# Patient Record
Sex: Male | Born: 1945
Health system: Southern US, Community
[De-identification: ages and names within clinical notes are randomized; demographics above are authoritative.]

## PROBLEM LIST (undated history)

## (undated) DIAGNOSIS — E119 Type 2 diabetes mellitus without complications: Secondary | ICD-10-CM

## (undated) DIAGNOSIS — I2510 Atherosclerotic heart disease of native coronary artery without angina pectoris: Secondary | ICD-10-CM

## (undated) DIAGNOSIS — I10 Essential (primary) hypertension: Secondary | ICD-10-CM

## (undated) DIAGNOSIS — R600 Localized edema: Secondary | ICD-10-CM

## (undated) DIAGNOSIS — R29898 Other symptoms and signs involving the musculoskeletal system: Secondary | ICD-10-CM

## (undated) DIAGNOSIS — M4800 Spinal stenosis, site unspecified: Secondary | ICD-10-CM

## (undated) DIAGNOSIS — E785 Hyperlipidemia, unspecified: Secondary | ICD-10-CM

## (undated) DIAGNOSIS — E663 Overweight: Secondary | ICD-10-CM

## (undated) DIAGNOSIS — L97509 Non-pressure chronic ulcer of other part of unspecified foot with unspecified severity: Secondary | ICD-10-CM

## (undated) HISTORY — PX: NO PAST SURGERIES: SHX2092

## (undated) HISTORY — DX: Spinal stenosis, site unspecified: M48.00

## (undated) HISTORY — DX: Non-pressure chronic ulcer of other part of unspecified foot with unspecified severity: L97.509

## (undated) HISTORY — DX: Localized edema: R60.0

## (undated) HISTORY — DX: Atherosclerotic heart disease of native coronary artery without angina pectoris: I25.10

## (undated) HISTORY — DX: Overweight: E66.3

## (undated) HISTORY — DX: Other symptoms and signs involving the musculoskeletal system: R29.898

---

## 2008-08-17 ENCOUNTER — Emergency Department (HOSPITAL_BASED_OUTPATIENT_CLINIC_OR_DEPARTMENT_OTHER): Admission: EM | Admit: 2008-08-17 | Discharge: 2008-08-17 | Payer: Self-pay | Admitting: Emergency Medicine

## 2008-09-21 ENCOUNTER — Encounter: Admission: RE | Admit: 2008-09-21 | Discharge: 2008-09-21 | Payer: Self-pay | Admitting: Sports Medicine

## 2014-04-30 ENCOUNTER — Ambulatory Visit: Payer: Self-pay | Admitting: Skilled Nursing Facility1

## 2014-10-04 ENCOUNTER — Ambulatory Visit: Payer: PPO | Admitting: Dietician

## 2014-10-15 ENCOUNTER — Ambulatory Visit: Payer: PPO | Admitting: Dietician

## 2014-11-09 ENCOUNTER — Ambulatory Visit: Payer: PPO | Admitting: *Deleted

## 2015-03-01 ENCOUNTER — Other Ambulatory Visit: Payer: Self-pay

## 2015-03-01 ENCOUNTER — Encounter (HOSPITAL_COMMUNITY)
Admission: EM | Disposition: A | Discharge status: Home / Self Care | Payer: Self-pay | Source: Home / Self Care | Attending: Cardiology

## 2015-03-01 ENCOUNTER — Encounter (HOSPITAL_COMMUNITY): Payer: Self-pay | Admitting: Emergency Medicine

## 2015-03-01 ENCOUNTER — Emergency Department (HOSPITAL_COMMUNITY): Payer: PPO

## 2015-03-01 ENCOUNTER — Inpatient Hospital Stay (HOSPITAL_COMMUNITY)
Admission: EM | Admit: 2015-03-01 | Discharge: 2015-03-02 | DRG: 247 | Disposition: A | Discharge status: Home / Self Care | Payer: PPO | Attending: Cardiology | Admitting: Cardiology

## 2015-03-01 DIAGNOSIS — E876 Hypokalemia: Secondary | ICD-10-CM | POA: Diagnosis present

## 2015-03-01 DIAGNOSIS — Z87891 Personal history of nicotine dependence: Secondary | ICD-10-CM

## 2015-03-01 DIAGNOSIS — I25110 Atherosclerotic heart disease of native coronary artery with unstable angina pectoris: Secondary | ICD-10-CM | POA: Diagnosis present

## 2015-03-01 DIAGNOSIS — I10 Essential (primary) hypertension: Secondary | ICD-10-CM | POA: Diagnosis not present

## 2015-03-01 DIAGNOSIS — E785 Hyperlipidemia, unspecified: Secondary | ICD-10-CM | POA: Diagnosis present

## 2015-03-01 DIAGNOSIS — I2510 Atherosclerotic heart disease of native coronary artery without angina pectoris: Secondary | ICD-10-CM

## 2015-03-01 DIAGNOSIS — I214 Non-ST elevation (NSTEMI) myocardial infarction: Secondary | ICD-10-CM | POA: Diagnosis not present

## 2015-03-01 DIAGNOSIS — Z955 Presence of coronary angioplasty implant and graft: Secondary | ICD-10-CM

## 2015-03-01 HISTORY — DX: Atherosclerotic heart disease of native coronary artery without angina pectoris: I25.10

## 2015-03-01 HISTORY — DX: Hyperlipidemia, unspecified: E78.5

## 2015-03-01 HISTORY — PX: CARDIAC CATHETERIZATION: SHX172

## 2015-03-01 HISTORY — DX: Essential (primary) hypertension: I10

## 2015-03-01 LAB — I-STAT TROPONIN, ED: TROPONIN I, POC: 0.13 ng/mL — AB (ref 0.00–0.08)

## 2015-03-01 LAB — BASIC METABOLIC PANEL
Anion gap: 14 (ref 5–15)
Anion gap: 7 (ref 5–15)
BUN: 19 mg/dL (ref 6–20)
BUN: 17 mg/dL (ref 6–20)
CHLORIDE: 106 mmol/L (ref 101–111)
CHLORIDE: 102 mmol/L (ref 101–111)
CO2: 20 mmol/L — ABNORMAL LOW (ref 22–32)
CO2: 27 mmol/L (ref 22–32)
CREATININE: 0.74 mg/dL (ref 0.61–1.24)
CREATININE: 0.76 mg/dL (ref 0.61–1.24)
Calcium: 9 mg/dL (ref 8.9–10.3)
Calcium: 8.8 mg/dL — ABNORMAL LOW (ref 8.9–10.3)
GFR calc non Af Amer: >60 mL/min (ref >60)
Glucose, Bld: 122 mg/dL — ABNORMAL HIGH (ref 65–99)
Glucose, Bld: 114 mg/dL — ABNORMAL HIGH (ref 65–99)
POTASSIUM: 3.3 mmol/L — AB (ref 3.5–5.1)
POTASSIUM: 4.3 mmol/L (ref 3.5–5.1)
SODIUM: 140 mmol/L (ref 135–145)
SODIUM: 136 mmol/L (ref 135–145)

## 2015-03-01 LAB — CBC
HCT: 39.2 % (ref 39.0–52.0)
HEMOGLOBIN: 13.5 g/dL (ref 13.0–17.0)
MCH: 31.2 pg (ref 26.0–34.0)
MCHC: 34.4 g/dL (ref 30.0–36.0)
MCV: 90.5 fL (ref 78.0–100.0)
PLATELETS: 169 10*3/uL (ref 150–400)
RBC: 4.33 MIL/uL (ref 4.22–5.81)
RDW: 12.9 % (ref 11.5–15.5)
WBC: 6.7 10*3/uL (ref 4.0–10.5)

## 2015-03-01 LAB — MRSA PCR SCREENING: MRSA by PCR: NEGATIVE

## 2015-03-01 LAB — TSH: TSH: 1.631 u[IU]/mL (ref 0.350–4.500)

## 2015-03-01 LAB — PROTIME-INR
INR: 1.1 (ref 0.00–1.49)
Prothrombin Time: 14.4 seconds (ref 11.6–15.2)

## 2015-03-01 LAB — LIPID PANEL
CHOL/HDL RATIO: 4 ratio
CHOLESTEROL: 157 mg/dL (ref 0–200)
HDL: 39 mg/dL — ABNORMAL LOW (ref >40)
LDL Cholesterol: 93 mg/dL (ref 0–99)
TRIGLYCERIDES: 125 mg/dL (ref <150)
VLDL: 25 mg/dL (ref 0–40)

## 2015-03-01 LAB — TROPONIN I
TROPONIN I: 0.17 ng/mL — AB (ref <0.031)
TROPONIN I: 0.15 ng/mL — AB (ref <0.031)

## 2015-03-01 SURGERY — LEFT HEART CATH AND CORONARY ANGIOGRAPHY
Anesthesia: LOCAL

## 2015-03-01 MED ORDER — LORATADINE 10 MG PO TABS
10.0000 mg | ORAL_TABLET | Freq: Every day | ORAL | Status: DC
  Administered 2015-03-01: 10 mg via ORAL
  Filled 2015-03-01 (×2): qty 1

## 2015-03-01 MED ORDER — HEPARIN SODIUM (PORCINE) 1000 UNIT/ML IJ SOLN
INTRAMUSCULAR | Status: AC
  Filled 2015-03-01: qty 1

## 2015-03-01 MED ORDER — SODIUM CHLORIDE 0.9 % IV SOLN: 250.0000 mL | INTRAVENOUS | Status: DC | PRN

## 2015-03-01 MED ORDER — POTASSIUM CHLORIDE CRYS ER 20 MEQ PO TBCR
40.0000 meq | EXTENDED_RELEASE_TABLET | ORAL | Status: AC
Start: 2015-03-01 — End: 2015-03-01
  Administered 2015-03-01 (×2): 40 meq via ORAL
  Filled 2015-03-01 (×2): qty 2

## 2015-03-01 MED ORDER — LISINOPRIL 10 MG PO TABS
20.0000 mg | ORAL_TABLET | Freq: Every day | ORAL | Status: DC
  Administered 2015-03-01 – 2015-03-02 (×2): 20 mg via ORAL
  Filled 2015-03-01: qty 2
  Filled 2015-03-01: qty 1

## 2015-03-01 MED ORDER — SODIUM CHLORIDE 0.9 % WEIGHT BASED INFUSION
1.0000 mL/kg/h | INTRAVENOUS | Status: DC
  Administered 2015-03-01: 1 mL/kg/h via INTRAVENOUS

## 2015-03-01 MED ORDER — FENTANYL CITRATE (PF) 100 MCG/2ML IJ SOLN
INTRAMUSCULAR | Status: AC
Start: 2015-03-01 — End: 2015-03-01
  Filled 2015-03-01: qty 4

## 2015-03-01 MED ORDER — VERAPAMIL HCL 2.5 MG/ML IV SOLN
INTRAVENOUS | Status: DC | PRN
  Administered 2015-03-01: 16:00:00 via INTRA_ARTERIAL

## 2015-03-01 MED ORDER — HEART ATTACK BOUNCING BOOK
Freq: Once | Status: AC
  Administered 2015-03-01: 22:00:00
  Filled 2015-03-01: qty 1

## 2015-03-01 MED ORDER — HEPARIN (PORCINE) IN NACL 2-0.9 UNIT/ML-% IJ SOLN
INTRAMUSCULAR | Status: DC | PRN
  Administered 2015-03-01: 17:00:00

## 2015-03-01 MED ORDER — NITROGLYCERIN 0.4 MG SL SUBL: 0.4000 mg | SUBLINGUAL_TABLET | SUBLINGUAL | Status: DC | PRN

## 2015-03-01 MED ORDER — TICAGRELOR 90 MG PO TABS
90.0000 mg | ORAL_TABLET | Freq: Two times a day (BID) | ORAL | Status: DC
  Administered 2015-03-02: 90 mg via ORAL
  Filled 2015-03-01: qty 1

## 2015-03-01 MED ORDER — SODIUM CHLORIDE 0.9 % WEIGHT BASED INFUSION
3.0000 mL/kg/h | INTRAVENOUS | Status: DC
  Administered 2015-03-01: 3 mL/kg/h via INTRAVENOUS

## 2015-03-01 MED ORDER — FENTANYL CITRATE (PF) 100 MCG/2ML IJ SOLN
INTRAMUSCULAR | Status: DC | PRN
Start: 2015-03-01 — End: 2015-03-01
  Administered 2015-03-01: 25 ug via INTRAVENOUS
  Administered 2015-03-01: 50 ug via INTRAVENOUS

## 2015-03-01 MED ORDER — ONDANSETRON HCL 4 MG/2ML IJ SOLN: 4.0000 mg | Freq: Four times a day (QID) | INTRAMUSCULAR | Status: DC | PRN

## 2015-03-01 MED ORDER — FLUTICASONE PROPIONATE 50 MCG/ACT NA SUSP
2.0000 | Freq: Every day | NASAL | Status: DC
  Administered 2015-03-01: 2 via NASAL
  Filled 2015-03-01: qty 16

## 2015-03-01 MED ORDER — BISOPROLOL FUMARATE 10 MG PO TABS
10.0000 mg | ORAL_TABLET | Freq: Every day | ORAL | Status: DC
  Administered 2015-03-01 – 2015-03-02 (×2): 10 mg via ORAL
  Filled 2015-03-01 (×4): qty 1

## 2015-03-01 MED ORDER — ASPIRIN EC 81 MG PO TBEC: 81.0000 mg | DELAYED_RELEASE_TABLET | Freq: Every day | ORAL | Status: DC

## 2015-03-01 MED ORDER — SODIUM CHLORIDE 0.9 % IJ SOLN
3.0000 mL | Freq: Two times a day (BID) | INTRAMUSCULAR | Status: DC
  Administered 2015-03-01: 3 mL via INTRAVENOUS

## 2015-03-01 MED ORDER — SODIUM CHLORIDE 0.9 % IJ SOLN: 3.0000 mL | INTRAMUSCULAR | Status: DC | PRN

## 2015-03-01 MED ORDER — HYDROCHLOROTHIAZIDE 12.5 MG PO CAPS
12.5000 mg | ORAL_CAPSULE | Freq: Every day | ORAL | Status: DC
  Administered 2015-03-01 – 2015-03-02 (×2): 12.5 mg via ORAL
  Filled 2015-03-01 (×2): qty 1

## 2015-03-01 MED ORDER — LISINOPRIL-HYDROCHLOROTHIAZIDE 20-12.5 MG PO TABS: 1.0000 | ORAL_TABLET | Freq: Every day | ORAL | Status: DC

## 2015-03-01 MED ORDER — TICAGRELOR 90 MG PO TABS
ORAL_TABLET | ORAL | Status: AC
  Filled 2015-03-01: qty 1

## 2015-03-01 MED ORDER — HEPARIN (PORCINE) IN NACL 100-0.45 UNIT/ML-% IJ SOLN
1400.0000 [IU]/h | INTRAMUSCULAR | Status: DC
  Administered 2015-03-01: 1400 [IU]/h via INTRAVENOUS
  Filled 2015-03-01 (×2): qty 250

## 2015-03-01 MED ORDER — ASPIRIN 81 MG PO CHEW: 81.0000 mg | CHEWABLE_TABLET | ORAL | Status: DC

## 2015-03-01 MED ORDER — PANTOPRAZOLE SODIUM 40 MG PO TBEC
40.0000 mg | DELAYED_RELEASE_TABLET | Freq: Every day | ORAL | Status: DC
  Administered 2015-03-01 – 2015-03-02 (×2): 40 mg via ORAL
  Filled 2015-03-01 (×2): qty 1

## 2015-03-01 MED ORDER — MIDAZOLAM HCL 2 MG/2ML IJ SOLN
INTRAMUSCULAR | Status: DC | PRN
  Administered 2015-03-01: 1 mg via INTRAVENOUS
  Administered 2015-03-01: 2 mg via INTRAVENOUS

## 2015-03-01 MED ORDER — MIDAZOLAM HCL 2 MG/2ML IJ SOLN
INTRAMUSCULAR | Status: AC
  Filled 2015-03-01: qty 4

## 2015-03-01 MED ORDER — SODIUM CHLORIDE 0.9 % IJ SOLN: 3.0000 mL | Freq: Two times a day (BID) | INTRAMUSCULAR | Status: DC

## 2015-03-01 MED ORDER — LIDOCAINE HCL (PF) 1 % IJ SOLN
INTRAMUSCULAR | Status: AC
  Filled 2015-03-01: qty 30

## 2015-03-01 MED ORDER — HEPARIN (PORCINE) IN NACL 2-0.9 UNIT/ML-% IJ SOLN
INTRAMUSCULAR | Status: AC
  Filled 2015-03-01: qty 1000

## 2015-03-01 MED ORDER — VERAPAMIL HCL 2.5 MG/ML IV SOLN
INTRAVENOUS | Status: AC
  Filled 2015-03-01: qty 2

## 2015-03-01 MED ORDER — HEPARIN SODIUM (PORCINE) 1000 UNIT/ML IJ SOLN
INTRAMUSCULAR | Status: DC | PRN
  Administered 2015-03-01: 4500 [IU] via INTRAVENOUS
  Administered 2015-03-01: 5500 [IU] via INTRAVENOUS
  Administered 2015-03-01: 3000 [IU] via INTRAVENOUS

## 2015-03-01 MED ORDER — ANGIOPLASTY BOOK
Freq: Once | Status: AC
  Administered 2015-03-01: 19:00:00
  Filled 2015-03-01: qty 1

## 2015-03-01 MED ORDER — TICAGRELOR 90 MG PO TABS
ORAL_TABLET | ORAL | Status: DC | PRN
  Administered 2015-03-01: 180 mg via ORAL

## 2015-03-01 MED ORDER — OXYCODONE-ACETAMINOPHEN 5-325 MG PO TABS: 1.0000 | ORAL_TABLET | ORAL | Status: DC | PRN

## 2015-03-01 MED ORDER — ALPRAZOLAM 0.25 MG PO TABS
0.2500 mg | ORAL_TABLET | Freq: Once | ORAL | Status: AC | PRN
  Administered 2015-03-01: 23:00:00 0.25 mg via ORAL
  Filled 2015-03-01: qty 1

## 2015-03-01 MED ORDER — ASPIRIN EC 81 MG PO TBEC
81.0000 mg | DELAYED_RELEASE_TABLET | Freq: Every day | ORAL | Status: DC
  Administered 2015-03-01: 81 mg via ORAL
  Filled 2015-03-01: qty 1

## 2015-03-01 MED ORDER — LEVOCETIRIZINE DIHYDROCHLORIDE 5 MG PO TABS: 5.0000 mg | ORAL_TABLET | Freq: Every evening | ORAL | Status: DC

## 2015-03-01 MED ORDER — HEPARIN BOLUS VIA INFUSION
4000.0000 [IU] | Freq: Once | INTRAVENOUS | Status: AC
  Administered 2015-03-01: 4000 [IU] via INTRAVENOUS
  Filled 2015-03-01: qty 4000

## 2015-03-01 MED ORDER — SODIUM CHLORIDE 0.9 % IV SOLN: INTRAVENOUS | Status: AC

## 2015-03-01 MED ORDER — MORPHINE SULFATE (PF) 2 MG/ML IV SOLN
2.0000 mg | INTRAVENOUS | Status: DC | PRN
Start: 2015-03-01 — End: 2015-03-02

## 2015-03-01 MED ORDER — ATORVASTATIN CALCIUM 40 MG PO TABS
40.0000 mg | ORAL_TABLET | Freq: Every day | ORAL | Status: DC
  Administered 2015-03-01: 40 mg via ORAL
  Filled 2015-03-01 (×2): qty 1

## 2015-03-01 MED ORDER — IOHEXOL 350 MG/ML SOLN
INTRAVENOUS | Status: DC | PRN
  Administered 2015-03-01: 70 mL via INTRA_ARTERIAL

## 2015-03-01 MED ORDER — ACETAMINOPHEN 325 MG PO TABS
650.0000 mg | ORAL_TABLET | ORAL | Status: DC | PRN
Start: 2015-03-01 — End: 2015-03-02

## 2015-03-01 SURGICAL SUPPLY — 23 items
BALLN EMERGE MR 2.5X12 (BALLOONS) ×2
BALLN ~~LOC~~ EMERGE MR 4.0X8 (BALLOONS) ×2
BALLN ~~LOC~~ EMERGE MR 4.5X8 (BALLOONS) ×2
BALLOON EMERGE MR 2.5X12 (BALLOONS) ×1 IMPLANT
BALLOON ~~LOC~~ EMERGE MR 4.0X8 (BALLOONS) ×1 IMPLANT
BALLOON ~~LOC~~ EMERGE MR 4.5X8 (BALLOONS) ×1 IMPLANT
CATH INFINITI 5 FR JL3.5 (CATHETERS) ×2 IMPLANT
CATH INFINITI 5FR ANG PIGTAIL (CATHETERS) ×2 IMPLANT
CATH INFINITI JR4 5F (CATHETERS) ×2 IMPLANT
CATH OPTICROSS 40MHZ (CATHETERS) ×2 IMPLANT
CATH VISTA GUIDE 6FR XBLAD3.5 (CATHETERS) ×2 IMPLANT
DEVICE RAD COMP TR BAND LRG (VASCULAR PRODUCTS) ×2 IMPLANT
GLIDESHEATH SLEND SS 6F .021 (SHEATH) ×2 IMPLANT
KIT ENCORE 26 ADVANTAGE (KITS) ×2 IMPLANT
KIT HEART LEFT (KITS) ×2 IMPLANT
PACK CARDIAC CATHETERIZATION (CUSTOM PROCEDURE TRAY) ×2 IMPLANT
SLED PULL BACK IVUS (MISCELLANEOUS) ×2 IMPLANT
STENT PROMUS PREM MR 4.0X16 (Permanent Stent) ×2 IMPLANT
SYR MEDRAD MARK V 150ML (SYRINGE) ×2 IMPLANT
TRANSDUCER W/STOPCOCK (MISCELLANEOUS) ×2 IMPLANT
TUBING CIL FLEX 10 FLL-RA (TUBING) ×2 IMPLANT
WIRE COUGAR XT STRL 190CM (WIRE) ×2 IMPLANT
WIRE SAFE-T 1.5MM-J .035X260CM (WIRE) ×2 IMPLANT

## 2015-03-01 NOTE — ED Notes (Signed)
Pt remains monitored by blood pressure, pulse ox, and 12 lead.  

## 2015-03-01 NOTE — Interval H&P Note (Signed)
History and Physical Interval Note:  03/01/2015 3:05 PM  Vivien PrestoRobert W Bookbinder  has presented today for cardiac catheterization with the diagnosis of nstemi, unstable angina. The various methods of treatment have been discussed with the patient and family. After consideration of risks, benefits and other options for treatment, the patient has consented to  Procedure(s): Left Heart Cath and Coronary Angiography (N/A) as a surgical intervention .  The patient's history has been reviewed, patient examined, no change in status, stable for surgery.  I have reviewed the patient's chart and labs.  Questions were answered to the patient's satisfaction.    Cath Lab Visit (complete for each Cath Lab visit)  Clinical Evaluation Leading to the Procedure:   ACS: Yes.    Non-ACS:    Anginal Classification: CCS III  Anti-ischemic medical therapy: Minimal Therapy (1 class of medications)  Non-Invasive Test Results: No non-invasive testing performed  Prior CABG: No previous CABG        Casha Estupinan

## 2015-03-01 NOTE — Progress Notes (Signed)
ANTICOAGULATION CONSULT NOTE - Initial Consult  Pharmacy Consult for heparin Indication: chest pain/ACS  No Known Allergies  Patient Measurements: Height: 5' 11.5" (181.6 cm) Weight: 240 lb (108.863 kg) IBW/kg (Calculated) : 76.45 Heparin Dosing Weight: 99.6  Vital Signs: Temp: 98.6 F (37 C) (10/28 0108) Temp Source: Oral (10/28 0108) BP: 149/83 mmHg (10/28 0720) Pulse Rate: 91 (10/28 0720)  Labs:  Recent Labs  03/01/15 0127 03/01/15 0646  HGB 13.5  --   HCT 39.2  --   PLT 169  --   CREATININE 0.74  --   TROPONINI  --  0.17*    Estimated Creatinine Clearance: 113.4 mL/min (by C-G formula based on Cr of 0.74).   Medical History: Past Medical History  Diagnosis Date  . Hypertension   . Hyperlipidemia     Medications:  Scheduled:  . aspirin EC  81 mg Oral Daily  . atorvastatin  40 mg Oral q1800  . bisoprolol  10 mg Oral Q0600  . heparin  4,000 Units Intravenous Once  . lisinopril  20 mg Oral Daily   And  . hydrochlorothiazide  12.5 mg Oral Daily  . loratadine  10 mg Oral q1800  . pantoprazole  40 mg Oral Daily  . potassium chloride  40 mEq Oral Q2H    Assessment: 69 yo male admitted for chest pain. R/o possible ACS. No acute changes on EKG, troponin 0.13 > 0.17. Pharmacy consulted to dose heparin. Hgb 13.5 and PLT 169.  Goal of Therapy:  Heparin level 0.3-0.7 units/ml Monitor platelets by anticoagulation protocol: Yes   Plan:  Give 4000 units bolus x 1 Start heparin infusion at 1400 units/hr Check anti-Xa level in 6 hours and daily while on heparin Continue to monitor H&H and platelets   Sherron MondayAubrey N. Terrisha Lopata, PharmD Clinical Pharmacy Resident Pager: [phone] 03/01/2015 9:06 AM

## 2015-03-01 NOTE — ED Provider Notes (Addendum)
History  By signing my name below, I, Karle Plumber, attest that this documentation has been prepared under the direction and in the presence of Geoffery Lyons, MD. Electronically Signed: Karle Plumber, ED Scribe. 03/01/2015. 2:06 AM   Chief Complaint  Patient presents with  . Chest Pain   Patient is a 69 y.o. male presenting with chest pain. The history is provided by the patient and medical records. No language interpreter was used.  Chest Pain Pain location:  L chest Pain radiates to:  L arm Pain severity:  No pain Onset quality:  Sudden Progression:  Resolved Chronicity:  Recurrent Context: at rest   Relieved by:  Aspirin Worsened by:  Nothing tried Associated symptoms: back pain   Associated symptoms: no shortness of breath   Risk factors: hypertension and obesity   Risk factors: no smoking     HPI Comments:  Alex Jackson is a 69 y.o. male, brought in by EMS, who presents to the Emergency Department complaining of CP that began PTA. He states approximately 10 days ago he began having pain around his right kidney. He states he received a chiropractic treatment with moderate, but not complete relief. Pt reports working at [employer] for the past several days and does not normally do that on a regular basis. He states he began having some mild pressure in his chest a few days ago but was relieved by belching and two baby ASA. He states he had similar symptoms PTA, took "a couple baby ASAs" and called EMS. He states by the time paramedics arrived, the pain was gone and is still gone. He denies modifying factors. He denies nausea, vomiting or diarrhea. He denies any h/o cardiac problems. He reports drinking "a couple drinks" per night. He denies smoking.   History reviewed. No pertinent past medical history. History reviewed. No pertinent past surgical history. History reviewed. No pertinent family history. Social History  Substance Use Topics  . Smoking status:  Former Smoker -- 1.00 packs/day    Quit date: 07/03/1983  . Smokeless tobacco: None  . Alcohol Use: Yes     Comment: "one or two drinks a night"    Review of Systems  Respiratory: Negative for shortness of breath.   Cardiovascular: Positive for chest pain.  Musculoskeletal: Positive for back pain.  All other systems reviewed and are negative.   Allergies  Review of patient's allergies indicates no known allergies.  Home Medications   Prior to Admission medications   Not on File   Triage Vitals: BP 134/72 mmHg  Pulse 79  Temp(Src) 98.6 F (37 C) (Oral)  Resp 14  Ht 5' 11.5" (1.816 m)  Wt 240 lb (108.863 kg)  BMI 33.01 kg/m2  SpO2 99% Physical Exam  Constitutional: He is oriented to person, place, and time. He appears well-developed and well-nourished.  HENT:  Head: Normocephalic.  Eyes: EOM are normal.  Neck: Normal range of motion.  Cardiovascular: Normal rate, regular rhythm and normal heart sounds.   No murmur heard. Pulmonary/Chest: Effort normal and breath sounds normal. No respiratory distress.  Abdominal: Soft. He exhibits no distension.  Musculoskeletal: Normal range of motion.  Neurological: He is alert and oriented to person, place, and time.  Psychiatric: He has a normal mood and affect.  Nursing note and vitals reviewed.   ED Course  Procedures (including critical care time) DIAGNOSTIC STUDIES: Oxygen Saturation is 99% on RA, normal by my interpretation.   COORDINATION OF CARE: 1:56 AM- Will consult cardiology and  wait for pending labs to result. Pt verbalizes understanding and agrees to plan.  Medications - No data to display  Labs Review Labs Reviewed  BASIC METABOLIC PANEL - Abnormal; Notable for the following:    Potassium 3.3 (*)    CO2 20 (*)    Glucose, Bld 122 (*)    All other components within normal limits  I-STAT TROPOININ, ED - Abnormal; Notable for the following:    Troponin i, poc 0.13 (*)    All other components within normal  limits  CBC    Imaging Review No results found. I have personally reviewed and evaluated these images and lab results as part of my medical decision-making.   EKG Interpretation   Date/Time:  Friday March 01 2015 00:57:02 EDT Ventricular Rate:  82 PR Interval:  181 QRS Duration: 110 QT Interval:  374 QTC Calculation: 437 R Axis:   81 Text Interpretation:  Sinus rhythm Borderline right axis deviation  Confirmed by Renesha Lizama  MD, Tine Mabee ([id number]) on 03/01/2015 1:22:23 AM      MDM   Final diagnoses:  None    Patient with intermittent chest discomfort for the past several days. He experienced an episode prior to coming here that was worse than prior episodes. He called 911 and his symptoms resolved prior to their arrival. He was transported here pain-free and arrives here pain free. His initial EKG reveals no acute process, however his initial troponin is elevated at 0.12. Due to the nature of his discomfort and the elevated troponin, I have called cardiology who will admit the patient. Dr. Onalee HuaAlvarez came to the ER and has taken care of this.  I personally performed the services described in this documentation, which was scribed in my presence. The recorded information has been reviewed and is accurate.      Geoffery Lyonsouglas Braddock Servellon, MD 03/01/15 [id number]  Geoffery Lyonsouglas Kawehi Hostetter, MD 03/01/15 [phone]

## 2015-03-01 NOTE — Progress Notes (Signed)
Risk and benefit of procedure explained to the patient who display clear understanding and agree to proceed.  Discussed with patient possible procedural risk include bleeding, vascular injury, renal injury, arrythmia, MI, stroke and loss of limb or life.  Signed, Eulla Kochanowski PA Pager: [phone]  

## 2015-03-01 NOTE — ED Notes (Signed)
MD at bedside. 

## 2015-03-01 NOTE — Progress Notes (Signed)
TR BAND REMOVAL  LOCATION:    right radial  DEFLATED PER PROTOCOL:    Yes.    TIME BAND OFF / DRESSING APPLIED:    20:15   SITE UPON ARRIVAL:    Level 0  SITE AFTER BAND REMOVAL:    Level 0  REVERSE ALLEN'S TEST:     positive  CIRCULATION SENSATION AND MOVEMENT:    Within Normal Limits   Yes.    COMMENTS:    

## 2015-03-01 NOTE — H&P (View-Only) (Signed)
Interval coverage note: please see overnight cardiology fellow's H&P for more information  69 yo male with PMH of HTN, HLD, remote 20 pack year tobacco use quit in 1985 presents with CP relieved with belching. Also had sensation of passed kidney stone on Wednesday 10/26. EKG did not show acute changes, POC trop mildly elevated at 0.13. Cardiology consulted for NSTEMI  Subjective: Per pt, this is the third time having CP, first time occurred during work, two other times occurred at rest. All relieved with belching after 5 min from onset. Had R flank pain in the beginning of this week, pain migrated to groin and felt to pass a kidney stone 2 days ago.   Physical exam: General: NAD Neuro: A&Ox4, move all extremities Heart: RRR, no murmur Lung: CTA Abd: soft, no tenderness on palpation   Plan:  1. NSTEMI:  - presenting symptom is very atypical (CP relieved with belching), no EKG changes, but cannot explain POC trop 0.13 (unless triggered from stress from passing kidney stone 2 days ago). Will obtain serial enzyme with Trop I, will see how trop trend, if 2nd trop going up, will need cath, if go down, may consider myoview  - large body likely prevent good image with coronary CT. Will discuss with MD  2. HTN 3. HLD 4. Hypokalemia: K 3.3, replete with 40meq KCl x2 5. Remote h/o smoking  Leonides SchanzSigned, Azalee CourseHao Meng PA Pager: [phone]   The patient was seen, examined and discussed with Azalee CourseHao Meng, PA-C and I agree with the above.   69 year old male with h/o HTN, HLP< smoking who presented with chest pain on exertion and ruled in for NSTEMI, we will shedule for a left cardiac cath. K will be replaced, continue ASA, atorvastatin, bisoprolol, lisinopril and iv heparin.   Lars MassonELSON, Lydiana Milley H 03/01/2015

## 2015-03-01 NOTE — ED Notes (Signed)
Pt woke up experiencing CP that radiated from the center of the chest to the left arm.  He reports this pressure is different from the GERD or Kidney stones he has in the past.  Denies SOB, N/V/D.  Did take 4 "baby ASA" as instructed by the dispatch.  Vitals: 166/98, p:80, resp 16.

## 2015-03-01 NOTE — Progress Notes (Signed)
Interval coverage note: please see overnight cardiology fellow's H&P for more information  68 yo male with PMH of HTN, HLD, remote 20 pack year tobacco use quit in 1985 presents with CP relieved with belching. Also had sensation of passed kidney stone on Wednesday 10/26. EKG did not show acute changes, POC trop mildly elevated at 0.13. Cardiology consulted for NSTEMI  Subjective: Per pt, this is the third time having CP, first time occurred during work, two other times occurred at rest. All relieved with belching after 5 min from onset. Had R flank pain in the beginning of this week, pain migrated to groin and felt to pass a kidney stone 2 days ago.   Physical exam: General: NAD Neuro: A&Ox4, move all extremities Heart: RRR, no murmur Lung: CTA Abd: soft, no tenderness on palpation   Plan:  1. NSTEMI:  - presenting symptom is very atypical (CP relieved with belching), no EKG changes, but cannot explain POC trop 0.13 (unless triggered from stress from passing kidney stone 2 days ago). Will obtain serial enzyme with Trop I, will see how trop trend, if 2nd trop going up, will need cath, if go down, may consider myoview  - large body likely prevent good image with coronary CT. Will discuss with MD  2. HTN 3. HLD 4. Hypokalemia: K 3.3, replete with 40meq KCl x2 5. Remote h/o smoking  Signed, Hao Meng PA Pager: [phone]   The patient was seen, examined and discussed with Hao Meng, PA-C and I agree with the above.   68 year old male with h/o HTN, HLP< smoking who presented with chest pain on exertion and ruled in for NSTEMI, we will shedule for a left cardiac cath. K will be replaced, continue ASA, atorvastatin, bisoprolol, lisinopril and iv heparin.   Pami Wool H 03/01/2015  

## 2015-03-01 NOTE — H&P (Addendum)
HPI: [name] Jackson is a 69 yo man with HTN, HLD and remote 20 pack year tobacco use (quite 51) who presents with chest pain.  He reports that Monday night he awoke from sleep with chest pain, left chest radiating to left shoulder described as moderate intensity and ache/pressure in quality.  He took 2 aspirin and pain resolved within approximately 5 minutes after belching.  He states that Wed he had an episode of CP, lasted less than 1 minute, and didn't pay it much attention.  Today, however, he reports another episode similar that from Monday night.  He again took ASA but this time did not resolve so he called EMS.  He was instructed to take 4 baby aspirin which he did and EMS was dispatched.  He again belched and pain resolved prior to EMS arrival.  On arrival to ED, he was chest pain free.  ECG without acute changes but some baseline wander on the inferior leads.  Initial troponin resulted at 0.13 prompting cardiology consult.  On arrival to interview patient, he had just returned from bathroom and again reported some mild chest discomfort that resolved shortly after we started talking.    He also reports symptoms consistent with having passed a kidney stone that began last week.  Flank pain, intermittent, severe that eventually moved to the groin and had the sensation of having passed a stone.  He denies prior history of kidney stones.     Review of Systems:     Cardiac Review of Systems: {Y] = yes  = no  Chest Pain [x    ]  Resting SOB [   ] Exertional SOB  [  ]  Orthopnea [  ]   Pedal Edema [   ]    Palpitations [  ] Syncope  [  ]   Presyncope [   ]  General Review of Systems: [Y] = yes [  ]=no Constitional: recent weight change [  ]; anorexia [  ]; fatigue [  ]; nausea [  ]; night sweats [  ]; fever [  ]; or chills [  ];                                                                      Dental: poor dentition[  ];   Eye : blurred vision [  ]; diplopia [   ]; vision changes [  ];   Amaurosis fugax[  ]; Resp: cough [  ];  wheezing[  ];  hemoptysis[  ]; shortness of breath[  ]; paroxysmal nocturnal dyspnea[  ]; dyspnea on exertion[  ]; or orthopnea[  ];  GI:  gallstones[  ], vomiting[  ];  dysphagia[  ]; melena[  ];  hematochezia [  ]; heartburn[  ];   GU: kidney stones [  ]; hematuria[  ];   dysuria [  ];  nocturia[  ];               Skin: rash [  ], swelling[  ];, hair loss[  ];  peripheral edema[  ];  or itching[  ]; Musculosketetal: myalgias[  ];  joint swelling[  ];  joint erythema[  ];  joint pain[  ];  back  pain[  ];  Heme/Lymph: bruising[  ];  bleeding[  ];  anemia[  ];  Neuro: TIA[  ];  headaches[  ];  stroke[  ];  vertigo[  ];  seizures[  ];   paresthesias[  ];  difficulty walking[  ];  Psych:depression[  ]; anxiety[  ];  Endocrine: diabetes[  ];  thyroid dysfunction[  ];  Other:  History reviewed. No pertinent past medical history.  Meds: ASA Bisoprolol lipitor zestoretic omeprazole  No Known Allergies  Social History   Social History  . Marital Status: Divorced    Spouse Name: N/A  . Number of Children: N/A  . Years of Education: N/A   Occupational History  . Not on file.   Social History Main Topics  . Smoking status: Former Smoker -- 1.00 packs/day    Quit date: 07/03/1983  . Smokeless tobacco: Not on file  . Alcohol Use: Yes     Comment: "one or two drinks a night"  . Drug Use: Not on file  . Sexual Activity: Not on file   Other Topics Concern  . Not on file   Social History Narrative  . No narrative on file    History reviewed. No pertinent family history.  PHYSICAL EXAM: Filed Vitals:   03/01/15 0108  BP: 134/72  Pulse: 79  Temp: 98.6 F (37 C)  Resp: 14   General: Overweight.  Well appearing. No respiratory difficulty HEENT: , AT, perrl, eomi anicteric Neck: supple. no JVD. Carotids 2+ bilat; no bruits. No lymphadenopathy or thryomegaly appreciated. Cor: PMI nondisplaced. Regular rate & rhythm. No rubs, gallops  or murmurs. Lungs: CTA Abdomen: soft, nontender, nondistended. No hepatosplenomegaly. No bruits or masses. Good bowel sounds. Extremities: no cyanosis, clubbing, rash, trace edema Neuro: alert & oriented x 3, cranial nerves grossly intact. moves all 4 extremities w/o difficulty. Affect pleasant.  ECG:  SR with FAVB, borderline STD lateral leads.  Results for orders placed or performed during the hospital encounter of 03/01/15 (from the past 24 hour(s))  Basic metabolic panel     Status: Abnormal   Collection Time: 03/01/15  1:27 AM  Result Value Ref Range   Sodium 140 135 - 145 mmol/L   Potassium 3.3 (L) 3.5 - 5.1 mmol/L   Chloride 106 101 - 111 mmol/L   CO2 20 (L) 22 - 32 mmol/L   Glucose, Bld 122 (H) 65 - 99 mg/dL   BUN 19 6 - 20 mg/dL   Creatinine, Ser 9.60 0.61 - 1.24 mg/dL   Calcium 9.0 8.9 - 45.4 mg/dL   GFR calc non Af Amer >60 >60 mL/min   GFR calc Af Amer >60 >60 mL/min   Anion gap 14 5 - 15  CBC     Status: None   Collection Time: 03/01/15  1:27 AM  Result Value Ref Range   WBC 6.7 4.0 - 10.5 K/uL   RBC 4.33 4.22 - 5.81 MIL/uL   Hemoglobin 13.5 13.0 - 17.0 g/dL   HCT 09.8 11.9 - 14.7 %   MCV 90.5 78.0 - 100.0 fL   MCH 31.2 26.0 - 34.0 pg   MCHC 34.4 30.0 - 36.0 g/dL   RDW 82.9 56.2 - 13.0 %   Platelets 169 150 - 400 K/uL  I-stat troponin, ED     Status: Abnormal   Collection Time: 03/01/15  1:29 AM  Result Value Ref Range   Troponin i, poc 0.13 (HH) 0.00 - 0.08 ng/mL   Comment NOTIFIED PHYSICIAN  Comment 3           No results found.   ASSESSMENT: 69 yo man with HTN, HLD who presents with chest pain concerning for angina and mildly elevated troponin c/w ACS/NSTEMI  PLAN/DISCUSSION: ASA, heparin, statin, BB Will monitor BP, initially normotensive but currently hypertensive after return from bathroom and anxiety Cycle troponin to peak Risk stratify with A1c and lipids TSH NPO for possible cath tomorrow Will need LVF assessment by echo (or LV-gram)

## 2015-03-02 ENCOUNTER — Inpatient Hospital Stay (HOSPITAL_COMMUNITY): Payer: PPO

## 2015-03-02 DIAGNOSIS — I10 Essential (primary) hypertension: Secondary | ICD-10-CM | POA: Diagnosis present

## 2015-03-02 DIAGNOSIS — Z955 Presence of coronary angioplasty implant and graft: Secondary | ICD-10-CM

## 2015-03-02 DIAGNOSIS — E785 Hyperlipidemia, unspecified: Secondary | ICD-10-CM | POA: Diagnosis present

## 2015-03-02 LAB — CBC
HEMATOCRIT: 38.9 % — AB (ref 39.0–52.0)
Hemoglobin: 13 g/dL (ref 13.0–17.0)
MCH: 30.7 pg (ref 26.0–34.0)
MCHC: 33.4 g/dL (ref 30.0–36.0)
MCV: 92 fL (ref 78.0–100.0)
PLATELETS: 170 10*3/uL (ref 150–400)
RBC: 4.23 MIL/uL (ref 4.22–5.81)
RDW: 13.2 % (ref 11.5–15.5)
WBC: 8.8 10*3/uL (ref 4.0–10.5)

## 2015-03-02 LAB — BASIC METABOLIC PANEL
Anion gap: 9 (ref 5–15)
BUN: 13 mg/dL (ref 6–20)
CHLORIDE: 104 mmol/L (ref 101–111)
CO2: 26 mmol/L (ref 22–32)
CREATININE: 0.79 mg/dL (ref 0.61–1.24)
Calcium: 9.1 mg/dL (ref 8.9–10.3)
GFR calc Af Amer: >60 mL/min (ref >60)
GFR calc non Af Amer: >60 mL/min (ref >60)
GLUCOSE: 105 mg/dL — AB (ref 65–99)
POTASSIUM: 3.8 mmol/L (ref 3.5–5.1)
SODIUM: 139 mmol/L (ref 135–145)

## 2015-03-02 LAB — HEMOGLOBIN A1C
Hgb A1c MFr Bld: 5.5 % (ref 4.8–5.6)
MEAN PLASMA GLUCOSE: 111 mg/dL

## 2015-03-02 LAB — POCT ACTIVATED CLOTTING TIME: Activated Clotting Time: 227 seconds

## 2015-03-02 MED ORDER — ATORVASTATIN CALCIUM 80 MG PO TABS: 80.0000 mg | ORAL_TABLET | Freq: Every day | ORAL | Status: DC

## 2015-03-02 MED ORDER — TICAGRELOR 90 MG PO TABS: 90.0000 mg | ORAL_TABLET | Freq: Two times a day (BID) | ORAL | Status: DC

## 2015-03-02 MED ORDER — NITROGLYCERIN 0.4 MG SL SUBL: 0.4000 mg | SUBLINGUAL_TABLET | SUBLINGUAL | Status: DC | PRN

## 2015-03-02 NOTE — Progress Notes (Signed)
Patient Profile: 69 yo man with HTN, HLD who presents with chest pain concerning for angina and mildly elevated troponin c/w ACS/NSTEMI  Subjective: Feels great. Denies and CP. No dyspnea. Right radial cath site is ok.   Objective: Vital signs in last 24 hours: Temp:  [97 F (36.1 C)-98.6 F (37 C)] 97.9 F (36.6 C) (10/29 0742) Pulse Rate:  [64-86] 72 (10/29 0742) Resp:  [11-26] 16 (10/29 0742) BP: (141-194)/(76-121) 151/77 mmHg (10/29 0742) SpO2:  [96 %-100 %] 98 % (10/29 0742) Weight:  [244 lb 11.4 oz (111 kg)-247 lb 8 oz (112.265 kg)] 247 lb 8 oz (112.265 kg) (10/29 0420)    Intake/Output from previous day: 10/28 0701 - 10/29 0700 In: 602.4 [I.V.:602.4] Out: 1660 [Urine:1660] Intake/Output this shift: Total I/O In: 280 [P.O.:280] Out: 200 [Urine:200]  Medications Current Facility-Administered Medications  Medication Dose Route Frequency Provider Last Rate Last Dose  . acetaminophen (TYLENOL) tablet 650 mg  650 mg Oral Q4H PRN Mariea StableManrique Alvarez, MD      . Melene Muller[START ON 03/03/2015] aspirin EC tablet 81 mg  81 mg Oral Daily Sonic AutomotiveKendra P Hiatt, RPH      . atorvastatin (LIPITOR) tablet 40 mg  40 mg Oral q1800 Mariea StableManrique Alvarez, MD   40 mg at 03/01/15 2020  . bisoprolol (ZEBETA) tablet 10 mg  10 mg Oral Q0600 Mariea StableManrique Alvarez, MD   10 mg at 03/02/15 [id number]  . fluticasone (FLONASE) 50 MCG/ACT nasal spray 2 spray  2 spray Each Nare Daily Joline SaltRhonda G Barrett, PA-C   2 spray at 03/01/15 2021  . lisinopril (PRINIVIL,ZESTRIL) tablet 20 mg  20 mg Oral Daily Lars MassonKatarina H Nelson, MD   20 mg at 03/01/15 1033   And  . hydrochlorothiazide (MICROZIDE) capsule 12.5 mg  12.5 mg Oral Daily Lars MassonKatarina H Nelson, MD   12.5 mg at 03/01/15 1033  . loratadine (CLARITIN) tablet 10 mg  10 mg Oral q1800 Lars MassonKatarina H Nelson, MD   10 mg at 03/01/15 2020  . morphine 2 MG/ML injection 2 mg  2 mg Intravenous Q1H PRN Kathleene Hazelhristopher D McAlhany, MD      . nitroGLYCERIN (NITROSTAT) SL tablet 0.4 mg  0.4 mg Sublingual Q5 Min x 3  PRN Mariea StableManrique Alvarez, MD      . ondansetron (ZOFRAN) injection 4 mg  4 mg Intravenous Q6H PRN Mariea StableManrique Alvarez, MD      . oxyCODONE-acetaminophen (PERCOCET/ROXICET) 5-325 MG per tablet 1-2 tablet  1-2 tablet Oral Q4H PRN Kathleene Hazelhristopher D McAlhany, MD      . pantoprazole (PROTONIX) EC tablet 40 mg  40 mg Oral Daily Mariea StableManrique Alvarez, MD   40 mg at 03/01/15 1038  . ticagrelor (BRILINTA) tablet 90 mg  90 mg Oral BID Kathleene Hazelhristopher D McAlhany, MD   90 mg at 03/02/15 [id number]    PE: General appearance: alert, cooperative and appears yonger than age, moderately obese Neck: no carotid bruit and no JVD Lungs: clear to auscultation bilaterally Heart: regular rate and rhythm, S1, S2 normal, no murmur, click, rub or gallop Extremities: no LEE Pulses: 2+ and symmetric Skin: warm and dry Neurologic: Grossly normal  Lab Results:   Recent Labs  03/01/15 0127 03/02/15 0425  WBC 6.7 8.8  HGB 13.5 13.0  HCT 39.2 38.9*  PLT 169 170   BMET  Recent Labs  03/01/15 0127 03/01/15 0905 03/02/15 0425  NA 140 136 139  K 3.3* 4.3 3.8  CL 106 102 104  CO2 20* 27 26  GLUCOSE 122* 114* 105*  BUN CREATININE 0.74 0.76 0.79  CALCIUM 9.0 8.8* 9.1   PT/INR  Recent Labs  03/01/15 1300  LABPROT 14.4  INR 1.10   Cholesterol  Recent Labs  03/01/15 0905  CHOL 157   Cardiac Panel (last 3 results)  Recent Labs  03/01/15 0646 03/01/15 0905  TROPONINI 0.17* 0.15*     Studies/Results: LHC 03/01/15 Conclusion     Mid RCA to Dist RCA lesion, 30% stenosed.  RPDA lesion, 20% stenosed.  Prox LAD lesion, 99% stenosed. Post intervention, there is a 0% residual stenosis. DES placed proximal LAD.  There is mild left ventricular systolic dysfunction.  1. NSTEMI secondary to severe stenosis proximal LAD, now s/p successful PTCA/DES x 1 proximal LAD.  2. Minimal disease RCA 3. Mild segmental LV systolic dysfunction     Assessment/Plan  Active Problems:   NSTEMI (non-ST elevated  myocardial infarction) (HCC)   Coronary artery disease involving native coronary artery of native heart with unstable angina pectoris (HCC)   1. NSTEMI:  LHC yesterday demonstrated a 99% proximal LAD lesion, s/p successful PTCA/DES x 1. Also with residual nonobstructive CAD in the mid-distal RCA (30%) and a 20% RPDA lesion. Mid segmental LV systolic dysfunction was noted. EF 45-50%.   -Denies any recurrent CP. No dyspnea. Will ambulate with CR prior to d/c to assess for any exertional angina.   - Continue DAPT with ASA + Brilinta. Continue statin therapy, BB and ACE-I.   2. LV Systolic Dysfunction: mild by cath with EF at 45-50%. Echo pending. Volume is stable. Continue BB and ACE-I.   3. Post Cath: right radial is stable. Renal function is stable.   4. HTN: moderately elevated. Consider increasing lisinopril to 40 mg daily.   5. HLD: LDL 93 mg/dL. Goal in the setting of CAD is <70. Recommend increasing Lipitor to 80 mg daily for more aggressive LDL reduction.   Dispo: likely d/c home today. F/u with Dr. Delton See   LOS: 1 day    Roxy Horseman. Delmer Islam 03/02/2015 7:46 AM

## 2015-03-02 NOTE — Progress Notes (Signed)
CARDIAC REHAB PHASE I   PRE:  Rate/Rhythm: 64 NSR  BP:  Supine: 140/77  Sitting:   Standing:    SaO2: 97% RA  MODE:  Ambulation: 600 ft   POST:  Rate/Rhythm: 76  BP:  Supine:   Sitting: 183/90  Standing:    SaO2: 97% RA  [id number] Pt tolerated ambulation well with assist x1, gait steady, no c/o. BP elevated after ambulation, otherwise vital signs stable. MI/PCI education completed including restrictions, risk factor modification, CP, NTG use, & calling 911. Heart healthy diet sheet and exercise guidelines given. Pt verbalizes understanding of instructions given. Discussed Phase 2 cardiac rehab and pt is interested. Permission given to send contact info to [facility] at [facility].  Artist Paislinty M Atalya Dano, MS, ACSM CCEP

## 2015-03-02 NOTE — Progress Notes (Signed)
*  PRELIMINARY RESULTS* Echocardiogram 2D Echocardiogram has been performed.  Jeryl Columbialliott, Raef Sprigg 03/02/2015, 10:07 AM

## 2015-03-02 NOTE — Discharge Summary (Signed)
Physician Discharge Summary  Patient ID: Alex PrestoRobert W Gintz MRN: [id number] DOB/AGE: 1946/03/30 69 y.o.   Primary Cardiologist: Dr. Delton SeeNelson  Admit date: 03/01/2015 Discharge date: 03/02/2015  Admission Diagnoses: NSTEMI  Discharge Diagnoses:  Principal Problem:   NSTEMI (non-ST elevated myocardial infarction) [place](HCC) Active Problems:   Coronary artery disease involving native coronary artery of native heart with unstable angina pectoris (HCC)   Hyperlipidemia   Essential hypertension   S/P drug eluting coronary stent placement   Discharged Condition: stable  Hospital Course: 69 yo male with PMH of HTN, HLD, remote 20 pack year tobacco use but quit in 1985 who [facility] with CP. He ruled in for NSTEMI. Troponin peaked at 0.17. He underwent a LHC via the right radial artery, performed by Dr. Clifton JamesMcAlhany. He was found to have severe stenosis of the proximal LAD and underwent successful PCI + DES. There was 0% residual stenosis. He also had minimal disease in the RCA. There was mild LV dysfunction with an estimated EF of 45-50%. He tolerated the procedure well and left the cath lab in stable condition. He had no recurrent CP and no post cath complications. He was placed on DAPT with ASA + Brilinta. He was continued on BB, ACE-I and statin. He had no difficulties ambulating with cardiac rehab. Vital signs remained stable. His right radial cath site remained stable.  His volume status also remained stable. He was last seen and examined by Dr. Rennis GoldenHilty who determined he was stable for discharge home. He will f/u with Dr. Delton SeeNelson.   Consults: None  Significant Diagnostic Studies:  LHC 03/01/15 Conclusion     Mid RCA to Dist RCA lesion, 30% stenosed.  RPDA lesion, 20% stenosed.  Prox LAD lesion, 99% stenosed. Post intervention, there is a 0% residual stenosis. DES placed proximal LAD.  There is mild left ventricular systolic dysfunction.  1. NSTEMI secondary to severe  stenosis proximal LAD, now s/p successful PTCA/DES x 1 proximal LAD.  2. Minimal disease RCA 3. Mild segmental LV systolic dysfunction   Left Heart    Left Ventricle The left ventricular size is normal. There is mild left ventricular systolic dysfunction. The left ventricular ejection fraction is 45-50% by visual estimate. There are wall motion abnormalities in the left ventricle. There are segmental wall motion abnormalities in the left ventricle.       Treatments: See Hospital Course  Discharge Exam: Blood pressure 151/77, pulse 72, temperature 97.9 F (36.6 C), temperature source Oral, resp. rate 16, height 5' 11.5" (1.816 m), weight 247 lb 8 oz (112.265 kg), SpO2 98 %.   Disposition: Final discharge disposition not confirmed      Discharge Instructions    Amb Referral to Cardiac Rehabilitation    Complete by:  As directed   Diagnosis:   Myocardial Infarction PCI       Diet - low sodium heart healthy    Complete by:  As directed      Increase activity slowly    Complete by:  As directed             Medication List    TAKE these medications        aspirin EC 81 MG tablet  Take 81 mg by mouth daily.     atorvastatin 80 MG tablet  Commonly known as:  LIPITOR  Take 1 tablet (80 mg total) by mouth daily.     bisoprolol 10 MG tablet  Commonly known as:  ZEBETA  Take 10  mg by mouth daily.     levocetirizine 5 MG tablet  Commonly known as:  XYZAL  Take 5 mg by mouth every evening.     lisinopril-hydrochlorothiazide 20-12.5 MG tablet  Commonly known as:  PRINZIDE,ZESTORETIC  Take 1 tablet by mouth daily.     multivitamin with minerals Tabs tablet  Take 1 tablet by mouth daily.     nitroGLYCERIN 0.4 MG SL tablet  Commonly known as:  NITROSTAT  Place 1 tablet (0.4 mg total) under the tongue every 5 (five) minutes x 3 doses as needed for chest pain.     omeprazole 20 MG capsule  Commonly known as:  PRILOSEC  Take 20 mg by mouth daily.     ticagrelor 90  MG Tabs tablet  Commonly known as:  BRILINTA  Take 1 tablet (90 mg total) by mouth 2 (two) times daily.     ticagrelor 90 MG Tabs tablet  Commonly known as:  BRILINTA  Take 1 tablet (90 mg total) by mouth 2 (two) times daily.       Follow-up Information    Follow up with Lars Masson, MD.   Specialty:  Cardiology   Why:  our office will call you with a follow-up appointment with Dr. Malena Peer information:   [address] [place] [address] [phone]       TIME SPENT ON DISCHARGE, INCLUDING PHYSICIAN TIME: > 30 MINUTES  Signed: Chey Cho 03/02/2015, 9:41 AM

## 2015-03-04 ENCOUNTER — Encounter (HOSPITAL_COMMUNITY): Payer: Self-pay | Admitting: Cardiovascular Disease

## 2015-03-04 ENCOUNTER — Telehealth: Payer: Self-pay | Admitting: Cardiology

## 2015-03-04 LAB — GLUCOSE, CAPILLARY: GLUCOSE-CAPILLARY: 105 mg/dL — AB (ref 65–99)

## 2015-03-04 NOTE — Telephone Encounter (Signed)
New message      Pt recently had a cath.  He thinks he was told that he could not submerge his wrist under water.  He want to take a bath.  Is this correct?

## 2015-03-04 NOTE — Telephone Encounter (Signed)
Pt advised to keep cath site clean and dry, not submerge arm under water until site of cath completely healed.

## 2015-03-07 ENCOUNTER — Encounter: Payer: Self-pay | Admitting: Cardiology

## 2015-03-07 ENCOUNTER — Ambulatory Visit (INDEPENDENT_AMBULATORY_CARE_PROVIDER_SITE_OTHER): Payer: PPO | Admitting: Cardiology

## 2015-03-07 VITALS — BP 132/74 | HR 62 | Ht 71.0 in | Wt 239.8 lb

## 2015-03-07 DIAGNOSIS — I252 Old myocardial infarction: Secondary | ICD-10-CM

## 2015-03-07 DIAGNOSIS — I2510 Atherosclerotic heart disease of native coronary artery without angina pectoris: Secondary | ICD-10-CM | POA: Diagnosis not present

## 2015-03-07 DIAGNOSIS — E785 Hyperlipidemia, unspecified: Secondary | ICD-10-CM

## 2015-03-07 DIAGNOSIS — I519 Heart disease, unspecified: Secondary | ICD-10-CM

## 2015-03-07 DIAGNOSIS — I10 Essential (primary) hypertension: Secondary | ICD-10-CM | POA: Diagnosis not present

## 2015-03-07 NOTE — Progress Notes (Signed)
Cardiology Office Note   Date:  03/07/2015   ID:  Alex Jackson, DOB 15-Oct-1945, MRN [id number]  PCP:  Farris HasMORROW, AARON, MD  Cardiologist:  Dr. Delton SeeNelson    Chief Complaint  Patient presents with  . Hospitalization Follow-up    no chest pain, no SOB      History of Present Illness: Alex PrestoRobert W Sustaita is a 69 y.o. male who presents for post hospitalization for NSTEMI-pk troponin 0.17, cath with findings of severe stenosis of the proximal LAD and underwent successful PCI + DES- Promus premier.   Minimal disease in the RCA,  EF of 45-50%.  Marland Kitchen. He was placed on DAPT with ASA + Brilinta. He was continued on BB, ACE-I and statin.  D/C'd 03/02/15.    He is doing well post discharge.  Walking every couple of hours, 6,000 steps in a day. No SOB, he had brief twinges when he first arrived home but none now.  He is trying to loose weight and is being successful.  He has NTG for PRN use and doing well.    Past Medical History  Diagnosis Date  . Hypertension   . Hyperlipidemia     Past Surgical History  Procedure Laterality Date  . No past surgeries    . Cardiac catheterization N/A 03/01/2015    Procedure: Left Heart Cath and Coronary Angiography;  Surgeon: Kathleene Hazelhristopher D McAlhany, MD;  [facility];  Service: Cardiovascular;  Laterality: N/A;     Current Outpatient Prescriptions  Medication Sig Dispense Refill  . aspirin EC 81 MG tablet Take 81 mg by mouth daily.    Marland Kitchen. atorvastatin (LIPITOR) 80 MG tablet Take 1 tablet (80 mg total) by mouth daily. 30 tablet 5  . bisoprolol (ZEBETA) 10 MG tablet Take 10 mg by mouth daily.    . fluticasone (FLONASE) 50 MCG/ACT nasal spray USE 1 SPRAY IN EACH NOSTRIL EVERY DAY  4  . levocetirizine (XYZAL) 5 MG tablet Take 5 mg by mouth every evening.    Marland Kitchen. lisinopril-hydrochlorothiazide (PRINZIDE,ZESTORETIC) 20-12.5 MG tablet Take 1 tablet by mouth daily.    . Multiple Vitamin (MULTIVITAMIN WITH MINERALS) TABS tablet Take 1 tablet by mouth  daily.    . nitroGLYCERIN (NITROSTAT) 0.4 MG SL tablet Place 1 tablet (0.4 mg total) under the tongue every 5 (five) minutes x 3 doses as needed for chest pain. 25 tablet 2  . omeprazole (PRILOSEC) 20 MG capsule Take 20 mg by mouth daily.    . ticagrelor (BRILINTA) 90 MG TABS tablet Take 1 tablet (90 mg total) by mouth 2 (two) times daily. 60 tablet 10   No current facility-administered medications for this visit.    Allergies:   Review of patient's allergies indicates no known allergies.    Social History:  The patient  reports that he quit smoking about 31 years ago. He does not have any smokeless tobacco history on file. He reports that he drinks alcohol. He reports that he does not use illicit drugs.   Family History:  The patient's family history includes Arrhythmia in his sister; Liver cancer in his father; Thyroid cancer in his mother.    ROS:  General:no colds or fevers, no weight changes Skin:no rashes or ulcers HEENT:no blurred vision, no congestion CV:see HPI PUL:see HPI GI:no diarrhea constipation or melena, no indigestion GU:no hematuria, no dysuria MS:no joint pain, no claudication Neuro:no syncope, no lightheadedness Endo:no diabetes, no thyroid disease  Wt Readings from Last 3 Encounters:  03/07/15 239  lb 12.8 oz (108.773 kg)  03/02/15 247 lb 8 oz (112.265 kg)     PHYSICAL EXAM: VS:  BP 132/74 mmHg  Pulse 62  Ht  (1.803 m)  Wt 239 lb 12.8 oz (108.773 kg)  BMI 33.46 kg/m2 , BMI Body mass index is 33.46 kg/(m^2). General:Pleasant affect, NAD Skin:Warm and dry, brisk capillary refill HEENT:normocephalic, sclera clear, mucus membranes moist Neck:supple, no JVD, no bruits  Heart:S1S2 RRR without murmur, gallup, rub or click Lungs:clear without rales, rhonchi, or wheezes ZOX:WRUE, non tender, + BS, do not palpate liver spleen or masses Ext:no lower ext edema, 2+ pedal pulses, 2+ radial pulses Neuro:alert and oriented, MAE, follows commands, + facial  symmetry    EKG:  EKG is ordered today. The ekg ordered today demonstrates SR to Sinus arrhythmia.  HR 62.. Improved especially in lat leads.   Recent Labs: 03/01/2015: TSH 1.631 03/02/2015: BUN 13; Creatinine, Ser 0.79; Hemoglobin 13.0; Platelets 170; Potassium 3.8; Sodium 139    Lipid Panel    Component Value Date/Time   CHOL 157 03/01/2015 0905   TRIG 125 03/01/2015 0905   HDL 39* 03/01/2015 0905   CHOLHDL 4.0 03/01/2015 0905   VLDL 25 03/01/2015 0905   LDLCALC 93 03/01/2015 0905       Other studies Reviewed: Additional studies/ records that were reviewed today include: labs and below along with hospital notes. ECHO: Study Conclusions - Left ventricle: The cavity size was normal. There was moderate concentric hypertrophy. Systolic function was mildly reduced. The estimated ejection fraction was in the range of 45% to 50%. Diffuse hypokinesis. Regional wall motion abnormalities cannot be excluded. - Mitral valve: There was mild regurgitation. - Left atrium: The atrium was moderately dilated.  Cardiac cath: Dominance: Right   Left Anterior Descending  . Vessel is large.   . Prox LAD lesion, 99% stenosed. discrete.   Marland Kitchen PCI: The pre-interventional distal flow is normal (TIMI 3). Pre-stent angioplasty was performed. A drug-eluting stent was placed. The strut is apposed. Post-stent angioplasty was performed. The post-interventional distal flow is normal (TIMI 3). The intervention was successful. No complications occurred at this lesion.  . There is no residual stenosis post intervention.     . First Diagonal Branch   The vessel is moderate in size.   Marland Kitchen Second Diagonal Branch   The vessel is small in size.     Left Circumflex  . Vessel is moderate in size. Vessel is angiographically normal.   . First Obtuse Marginal Branch   The vessel is small in size.   Marland Kitchen Second Obtuse Marginal Branch   The vessel is small in size.     Right Coronary Artery   . Mid  RCA to Dist RCA lesion, 30% stenosed. diffuse.   . Right Posterior Descending Artery   . RPDA lesion, 20% stenosed. diffuse.        ASSESSMENT AND PLAN:  1.  CAD with recent NSTEMI with LAD stenosis having promus premier to LAD, mild residual RCA disease. Continue BB, brilinta and asa.  Follow up with Dr. Delton See in 2 months.   Ok to go to cardiac rehab.  Ok to return to work on Mar 11, 2015. He drives and delivers flowers.  No heavy lifting.   2. LV dysfunction with EF 45-50% continue  ACE/CHTZ- may need repeat echo after visit with Dr. Delton See.   3. Hyperlipidemia on lipitor 80 - recheck in 6 weeks. Or so he is to see PCP and have labs repeated.  4. Essential HTN controlled.     Current medicines are reviewed with the patient today.  The patient Has no concerns regarding medicines.  The following changes have been made:  See above Labs/ tests ordered today include:see above  Disposition:   FU:  see above  Signed, Leone Brand, NP  03/07/2015 9:10 AM    [facility] [address] [place], [place], [place]  [address]/ [address] [place], [place] Phone: [phone]; Fax: [phone]  [phone]

## 2015-03-07 NOTE — Patient Instructions (Signed)
You are okay to proceed with Cardiac Rehab.  Nada BoozerLaura Ingold, NP, recommends that you can go back to work on Monday, November 14th.  Your physician recommends that you schedule a follow-up appointment in 2 months with Dr Tobias AlexanderKatarina Nelson.

## 2015-03-18 ENCOUNTER — Encounter: Payer: Self-pay | Admitting: Cardiology

## 2015-03-26 ENCOUNTER — Telehealth (HOSPITAL_COMMUNITY): Payer: Self-pay

## 2015-03-26 NOTE — Telephone Encounter (Signed)
-----   Message from [facility] LLCCharmaine M Hall sent at 03/25/2015  1:03 PM EST ----- Regarding: RE: cardiac rehab phase II monitored program Chloe Flis,  No precert is required for mbrs plan.  I submitted clinicals etc to [employer].  They have closed the ZOX#[id number]#[id number].  When they close a ref# that means they have reviewed pt's policy and no precert is required for that CPT (09811(93798 - Outpt Cardiac Rehab)  You can put in comments for pt's first exercise the closed ref# if you would like  ----- Message -----    From: Paulita FujitaAmber D Hertha Gergen    Sent: 03/20/2015   4:15 PM      To: Britt Bologneseharmaine M Hall Subject: cardiac rehab phase II monitored program       Hi Charmaine,   My name is Hospital doctorAmber and I work at [facility] [employer]. Pt [id number] is covered by [employer] and needs pre-authorization or pre-approval in order to start CRPII.I am not aware how to go about authorization. If you could please assist me with directing this patient I would greatly appreciate it.   S/P: 10/28  NSTEMI   10/28 PTCA/DES prox LAD    Thank you    [facility]

## 2015-04-01 ENCOUNTER — Other Ambulatory Visit: Payer: Self-pay | Admitting: *Deleted

## 2015-04-01 MED ORDER — ATORVASTATIN CALCIUM 80 MG PO TABS: 80.0000 mg | ORAL_TABLET | Freq: Every day | ORAL | Status: DC

## 2015-05-10 ENCOUNTER — Ambulatory Visit (INDEPENDENT_AMBULATORY_CARE_PROVIDER_SITE_OTHER): Payer: PPO | Admitting: Cardiology

## 2015-05-10 ENCOUNTER — Encounter: Payer: Self-pay | Admitting: Cardiology

## 2015-05-10 VITALS — BP 124/70 | HR 96 | Ht 71.0 in | Wt 247.0 lb

## 2015-05-10 DIAGNOSIS — R0602 Shortness of breath: Secondary | ICD-10-CM | POA: Diagnosis not present

## 2015-05-10 DIAGNOSIS — I214 Non-ST elevation (NSTEMI) myocardial infarction: Secondary | ICD-10-CM | POA: Diagnosis not present

## 2015-05-10 DIAGNOSIS — E785 Hyperlipidemia, unspecified: Secondary | ICD-10-CM | POA: Diagnosis not present

## 2015-05-10 DIAGNOSIS — I10 Essential (primary) hypertension: Secondary | ICD-10-CM

## 2015-05-10 NOTE — Progress Notes (Signed)
Patient ID: Alex Jackson, male   DOB: [birth year], 70 y.o.   MRN: [id number]    Cardiology Office Note   Date:  05/10/2015   ID:  Alex Jackson, DOB [birth year], MRN [id number]  PCP:  [name], MD  Cardiologist:  Dr. Delton See    Chief complain: SOB   History of Present Illness: Alex Jackson is a 70 y.o. male who presents for post hospitalization for NSTEMI-pk troponin 0.17, cath with findings of severe stenosis of the proximal LAD and underwent successful PCI + DES- Promus premier.   Minimal disease in the RCA,  EF of 45-50%.  Marland Kitchen He was placed on DAPT with ASA + Brilinta. He was continued on BB, ACE-I and statin.  D/C'd 03/02/15.    He is coming after 2 months, he is doing great, no chest pain, DOE, no orthopnea, PND, no LE edema. His only complain is SOB that appears daily in the late mornings. The patient is very thankful for everything we did.   Past Medical History  Diagnosis Date  . Hypertension   . Hyperlipidemia   . CAD in native artery 03/01/15    DES to LAD residual non obstructive RCA disease.    Past Surgical History  Procedure Laterality Date  . No past surgeries    . Cardiac catheterization N/A 03/01/2015    Procedure: Left Heart Cath and Coronary Angiography;  Surgeon: Kathleene Hazel, MD;  Location: [facility] INVASIVE CV LAB;  Service: Cardiovascular;  Laterality: N/A;     Current Outpatient Prescriptions  Medication Sig Dispense Refill  . aspirin EC 81 MG tablet Take 81 mg by mouth daily.    Marland Kitchen atorvastatin (LIPITOR) 80 MG tablet Take 1 tablet (80 mg total) by mouth daily. 180 tablet 5  . bisoprolol (ZEBETA) 10 MG tablet Take 10 mg by mouth daily.    . fluticasone (FLONASE) 50 MCG/ACT nasal spray USE 1 SPRAY IN EACH NOSTRIL EVERY DAY  4  . levocetirizine (XYZAL) 5 MG tablet Take 5 mg by mouth every evening.    Marland Kitchen lisinopril-hydrochlorothiazide (PRINZIDE,ZESTORETIC) 20-12.5 MG tablet Take 1 tablet by mouth daily.    . Multiple Vitamin (MULTIVITAMIN  WITH MINERALS) TABS tablet Take 1 tablet by mouth daily.    . nitroGLYCERIN (NITROSTAT) 0.4 MG SL tablet Place 1 tablet (0.4 mg total) under the tongue every 5 (five) minutes x 3 doses as needed for chest pain. 25 tablet 2  . omeprazole (PRILOSEC) 20 MG capsule Take 20 mg by mouth daily.    . ticagrelor (BRILINTA) 90 MG TABS tablet Take 1 tablet (90 mg total) by mouth 2 (two) times daily. 60 tablet 10   No current facility-administered medications for this visit.    Allergies:   Review of patient's allergies indicates no known allergies.    Social History:  The patient  reports that he quit smoking about 31 years ago. He does not have any smokeless tobacco history on file. He reports that he drinks alcohol. He reports that he does not use illicit drugs.   Family History:  The patient's family history includes Arrhythmia in his sister; Liver cancer in his father; Thyroid cancer in his mother.    ROS:  General:no colds or fevers, no weight changes Skin:no rashes or ulcers HEENT:no blurred vision, no congestion CV:see HPI PUL:see HPI GI:no diarrhea constipation or melena, no indigestion GU:no hematuria, no dysuria MS:no joint pain, no claudication Neuro:no syncope, no lightheadedness Endo:no diabetes, no thyroid disease  Wt Readings from  Last 3 Encounters:  05/10/15 247 lb (112.038 kg)  03/07/15 239 lb 12.8 oz (108.773 kg)  03/02/15 247 lb 8 oz (112.265 kg)     PHYSICAL EXAM: VS:  BP 124/70 mmHg  Pulse 96  Ht 5\' 11"  (1.803 m)  Wt 247 lb (112.038 kg)  BMI 34.46 kg/m2 , BMI Body mass index is 34.46 kg/(m^2). General:Pleasant affect, NAD Skin:Warm and dry, brisk capillary refill HEENT:normocephalic, sclera clear, mucus membranes moist Neck:supple, no JVD, no bruits  Heart:S1S2 RRR without murmur, gallup, rub or click Lungs:clear without rales, rhonchi, or wheezes ZOX:WRUEAbd:soft, non tender, + BS, do not palpate liver spleen or masses Ext:no lower ext edema, 2+ pedal pulses, 2+  radial pulses Neuro:alert and oriented, MAE, follows commands, + facial symmetry    EKG:  EKG is ordered today. The ekg ordered today demonstrates SR to Sinus arrhythmia.  HR 62.. Improved especially in lat leads.   Recent Labs: 03/01/2015: TSH 1.631 03/02/2015: BUN 13; Creatinine, Ser 0.79; Hemoglobin 13.0; Platelets 170; Potassium 3.8; Sodium 139    Lipid Panel    Component Value Date/Time   CHOL 157 03/01/2015 0905   TRIG 125 03/01/2015 0905   HDL 39* 03/01/2015 0905   CHOLHDL 4.0 03/01/2015 0905   VLDL 25 03/01/2015 0905   LDLCALC 93 03/01/2015 0905       Other studies Reviewed: Additional studies/ records that were reviewed today include: labs and below along with hospital notes. ECHO: Study Conclusions - Left ventricle: The cavity size was normal. There was moderate concentric hypertrophy. Systolic function was mildly reduced. The estimated ejection fraction was in the range of 45% to 50%. Diffuse hypokinesis. Regional wall motion abnormalities cannot be excluded. - Mitral valve: There was mild regurgitation. - Left atrium: The atrium was moderately dilated.  Cardiac cath: Dominance: Right   Left Anterior Descending  . Vessel is large.   . Prox LAD lesion, 99% stenosed. discrete.   Marland Kitchen. PCI: The pre-interventional distal flow is normal (TIMI 3). Pre-stent angioplasty was performed. A drug-eluting stent was placed. The strut is apposed. Post-stent angioplasty was performed. The post-interventional distal flow is normal (TIMI 3). The intervention was successful. No complications occurred at this lesion.  . There is no residual stenosis post intervention.     . First Diagonal Branch   The vessel is moderate in size.   Marland Kitchen. Second Diagonal Branch   The vessel is small in size.     Left Circumflex  . Vessel is moderate in size. Vessel is angiographically normal.   . First Obtuse Marginal Branch   The vessel is small in size.   Marland Kitchen. Second Obtuse Marginal  Branch   The vessel is small in size.     Right Coronary Artery   . Mid RCA to Dist RCA lesion, 30% stenosed. diffuse.   . Right Posterior Descending Artery   . RPDA lesion, 20% stenosed. diffuse.       TTE: 03/02/15  Left ventricle: The cavity size was normal. There was moderate concentric hypertrophy. Systolic function was mildly reduced. The estimated ejection fraction was in the range of 45% to 50%. Diffuse hypokinesis. Regional wall motion abnormalities cannot be excluded. - Mitral valve: There was mild regurgitation. - Left atrium: The atrium was moderately dilated.     ASSESSMENT AND PLAN:  1.  CAD with recent NSTEMI with LAD stenosis having promus premier to LAD, mild residual RCA disease. Continue BB, brilinta and asa.  He will call us for potential switch of brilinta  to prasugrel   2. SOB - most probably sec to Brilinta, he will decide if he cant tolerate it, we would switch to Prasugrel  3. LV dysfunction with EF 45-50% continue  ACE/CHTZ- repeat echo now.  3. Hyperlipidemia on lipitor 80 mg  4. Essential HTN controlled.   Follow up in 3 months, echo and CMP, CBC and lipids now.  Signed, Lars Masson, MD  05/10/2015 8:39 AM    [facility] [address] [place], [place], [place]  [address] [address] [place], [place] Phone: [phone]; Fax: [phone]  [phone]

## 2015-05-10 NOTE — Patient Instructions (Signed)
Medication Instructions:   Your physician recommends that you continue on your current medications as directed. Please refer to the Current Medication list given to you today.   Labwork:  ASAP AND SAME DAY AS YOUR ECHO--TO CHECK--CMET, CBC W DIFF, AND LIPIDS---PLEASE COME FASTING TO THIS LAB APPOINTMENT   Testing/Procedures:  Your physician has requested that you have an echocardiogram. Echocardiography is a painless test that uses sound waves to create images of your heart. It provides your doctor with information about the size and shape of your heart and how well your heart's chambers and valves are working. This procedure takes approximately one hour. There are no restrictions for this procedure.  SCHEDULE THIS TEST THE SAME DAY AS YOUR LABS    Follow-Up:  3 MONTHS WITH DR Delton SeeNELSON    If you need a refill on your cardiac medications before your next appointment, please call your pharmacy.

## 2015-05-16 ENCOUNTER — Telehealth: Payer: Self-pay | Admitting: Cardiology

## 2015-05-16 NOTE — Telephone Encounter (Signed)
New message  Pt called req a call back to discuss if the labs are needed being that he just had them completed by his primary care 45 days prior. Please call back to discuss.

## 2015-05-16 NOTE — Telephone Encounter (Signed)
Pt calling to inform Dr Delton SeeNelson that he forgot to mention to her at his last OV on 05/10/15 that he had a recent physical with full labs drawn at his PCP office in 11/16.  Pt states he cholesterol was checked at that visit as well.  Pt states he was ordered to come to our office on 05/20/15 for a cbc w diff, cmet, and lipids.  Pt states he chooses to cancel this appt and have his PCP fax his recent labs to Dr Delton SeeNelson for further review instead.  Pt states his insurance company will not approve for him to have repeat of these labs so soon.  Informed the pt that would be fine and I will cancel his 1/16 lab appt.  Informed the pt of our contact and fax number to have his PCP send the labs too.  Pt verbalized understanding and gracious for all the assistance provided.  Will route this message to Dr Delton SeeNelson as an Lorain Childesfyi.

## 2015-05-17 DIAGNOSIS — H524 Presbyopia: Secondary | ICD-10-CM | POA: Diagnosis not present

## 2015-05-20 ENCOUNTER — Telehealth (HOSPITAL_COMMUNITY): Payer: Self-pay

## 2015-05-20 ENCOUNTER — Other Ambulatory Visit: Payer: PPO

## 2015-05-20 ENCOUNTER — Other Ambulatory Visit: Payer: Self-pay

## 2015-05-20 ENCOUNTER — Ambulatory Visit (HOSPITAL_COMMUNITY): Payer: PPO | Attending: Cardiovascular Disease

## 2015-05-20 DIAGNOSIS — I214 Non-ST elevation (NSTEMI) myocardial infarction: Secondary | ICD-10-CM | POA: Diagnosis not present

## 2015-05-20 DIAGNOSIS — I340 Nonrheumatic mitral (valve) insufficiency: Secondary | ICD-10-CM | POA: Diagnosis not present

## 2015-05-20 DIAGNOSIS — I517 Cardiomegaly: Secondary | ICD-10-CM | POA: Insufficient documentation

## 2015-05-20 DIAGNOSIS — Z87891 Personal history of nicotine dependence: Secondary | ICD-10-CM | POA: Diagnosis not present

## 2015-05-20 DIAGNOSIS — I10 Essential (primary) hypertension: Secondary | ICD-10-CM | POA: Insufficient documentation

## 2015-05-20 DIAGNOSIS — E785 Hyperlipidemia, unspecified: Secondary | ICD-10-CM | POA: Insufficient documentation

## 2015-05-20 NOTE — Telephone Encounter (Signed)
-----   Message from [facility] sent at 03/21/2015  8:36 AM EST ----- Regarding: RE: cardiac rehab phase II monitored program I put in precert request through [employer]. They do the precert for pts insurance [employer].  I will let you know as soon as they approve it.   ----- Message -----    From: Paulita FujitaAmber D Tereso Unangst    Sent: 03/20/2015   4:15 PM      To: Britt Bologneseharmaine M Hall Subject: cardiac rehab phase II monitored program       Hi Charmaine,   My name is Hospital doctorAmber and I work at [employer] [facility]. Pt [id number] is covered by [employer] and needs pre-authorization or pre-approval in order to start CRPII.I am not aware how to go about authorization. If you could please assist me with directing this patient I would greatly appreciate it.   S/P: 10/28  NSTEMI   10/28 PTCA/DES prox LAD    Thank you    [facility]

## 2015-05-27 ENCOUNTER — Other Ambulatory Visit: Payer: Self-pay | Admitting: *Deleted

## 2015-05-27 MED ORDER — TICAGRELOR 90 MG PO TABS: 90.0000 mg | ORAL_TABLET | Freq: Two times a day (BID) | ORAL | Status: DC

## 2015-05-27 MED ORDER — TICAGRELOR 90 MG PO TABS: 1.0000 mg | ORAL_TABLET | Freq: Two times a day (BID) | ORAL | Status: DC

## 2015-06-25 DIAGNOSIS — Z23 Encounter for immunization: Secondary | ICD-10-CM | POA: Diagnosis not present

## 2015-06-25 DIAGNOSIS — I214 Non-ST elevation (NSTEMI) myocardial infarction: Secondary | ICD-10-CM | POA: Diagnosis not present

## 2015-06-25 DIAGNOSIS — E785 Hyperlipidemia, unspecified: Secondary | ICD-10-CM | POA: Diagnosis not present

## 2015-06-25 DIAGNOSIS — I10 Essential (primary) hypertension: Secondary | ICD-10-CM | POA: Diagnosis not present

## 2015-06-25 DIAGNOSIS — R7309 Other abnormal glucose: Secondary | ICD-10-CM | POA: Diagnosis not present

## 2015-06-25 DIAGNOSIS — R972 Elevated prostate specific antigen [PSA]: Secondary | ICD-10-CM | POA: Diagnosis not present

## 2015-06-25 DIAGNOSIS — J309 Allergic rhinitis, unspecified: Secondary | ICD-10-CM | POA: Diagnosis not present

## 2015-07-15 DIAGNOSIS — R972 Elevated prostate specific antigen [PSA]: Secondary | ICD-10-CM | POA: Diagnosis not present

## 2015-07-15 DIAGNOSIS — R7309 Other abnormal glucose: Secondary | ICD-10-CM | POA: Diagnosis not present

## 2015-07-15 DIAGNOSIS — I10 Essential (primary) hypertension: Secondary | ICD-10-CM | POA: Diagnosis not present

## 2015-08-09 DIAGNOSIS — I10 Essential (primary) hypertension: Secondary | ICD-10-CM | POA: Diagnosis not present

## 2015-08-23 ENCOUNTER — Ambulatory Visit: Payer: PPO | Admitting: Cardiology

## 2015-08-30 ENCOUNTER — Ambulatory Visit: Payer: PPO | Admitting: Cardiology

## 2015-09-16 ENCOUNTER — Encounter: Payer: Self-pay | Admitting: Cardiology

## 2015-09-16 ENCOUNTER — Ambulatory Visit (INDEPENDENT_AMBULATORY_CARE_PROVIDER_SITE_OTHER): Payer: PPO | Admitting: Cardiology

## 2015-09-16 VITALS — BP 152/84 | HR 60 | Ht 71.0 in | Wt 248.0 lb

## 2015-09-16 DIAGNOSIS — I25110 Atherosclerotic heart disease of native coronary artery with unstable angina pectoris: Secondary | ICD-10-CM

## 2015-09-16 DIAGNOSIS — I519 Heart disease, unspecified: Secondary | ICD-10-CM

## 2015-09-16 DIAGNOSIS — E785 Hyperlipidemia, unspecified: Secondary | ICD-10-CM

## 2015-09-16 DIAGNOSIS — R0602 Shortness of breath: Secondary | ICD-10-CM

## 2015-09-16 DIAGNOSIS — Z955 Presence of coronary angioplasty implant and graft: Secondary | ICD-10-CM

## 2015-09-16 DIAGNOSIS — I10 Essential (primary) hypertension: Secondary | ICD-10-CM

## 2015-09-16 DIAGNOSIS — I255 Ischemic cardiomyopathy: Secondary | ICD-10-CM | POA: Insufficient documentation

## 2015-09-16 NOTE — Progress Notes (Signed)
Patient ID: Alex Jackson, male   DOB: [birth year], 69 y.o.   MRN: [id number]    Cardiology Office Note  Date:  09/16/2015   ID:  Alex Jackson, DOB [birth year], MRN [id number]  PCP:  [name], MD  Cardiologist:  Dr. Delton See    Chief complain: SOB   History of Present Illness: Alex Jackson is a 70 y.o. male who presents for post hospitalization for NSTEMI-pk troponin 0.17, cath with findings of severe stenosis of the proximal LAD and underwent successful PCI + DES- Promus premier.   Minimal disease in the RCA,  EF of 45-50%.  Marland Kitchen He was placed on DAPT with ASA + Brilinta. He was continued on BB, ACE-I and statin.  D/C'd 03/02/15.    09/16/15 - 6 months follow up, walks at least 10.000 steps a day, no chest pain, SOB, claudications, orthopnea, PND< no syncope. No muscle pain from atorvastatin. Normal labs in  03/2015, done again at the PCP office in March.  Compliant with his meds. Continue to have mild SOB with Brilinta. Complains of chronic back pain.  Past Medical History  Diagnosis Date  . Hypertension   . Hyperlipidemia   . CAD in native artery 03/01/15    DES to LAD residual non obstructive RCA disease.   Past Surgical History  Procedure Laterality Date  . No past surgeries    . Cardiac catheterization N/A 03/01/2015    Procedure: Left Heart Cath and Coronary Angiography;  Surgeon: Kathleene Hazel, MD;  Location: [facility] INVASIVE CV LAB;  Service: Cardiovascular;  Laterality: N/A;   Current Outpatient Prescriptions  Medication Sig Dispense Refill  . aspirin EC 81 MG tablet Take 81 mg by mouth daily.    Marland Kitchen atorvastatin (LIPITOR) 80 MG tablet Take 1 tablet (80 mg total) by mouth daily. 180 tablet 5  . bisoprolol (ZEBETA) 10 MG tablet Take 10 mg by mouth daily.    . fluticasone (FLONASE) 50 MCG/ACT nasal spray USE 1 SPRAY IN EACH NOSTRIL EVERY DAY  4  . levocetirizine (XYZAL) 5 MG tablet Take 5 mg by mouth every evening.    Marland Kitchen lisinopril-hydrochlorothiazide  (PRINZIDE,ZESTORETIC) 20-12.5 MG tablet Take 1 tablet by mouth daily.    . Multiple Vitamin (MULTIVITAMIN WITH MINERALS) TABS tablet Take 1 tablet by mouth daily.    . nitroGLYCERIN (NITROSTAT) 0.4 MG SL tablet Place 1 tablet (0.4 mg total) under the tongue every 5 (five) minutes x 3 doses as needed for chest pain. 25 tablet 2  . omeprazole (PRILOSEC) 20 MG capsule Take 20 mg by mouth daily.    . ticagrelor (BRILINTA) 90 MG TABS tablet Take 1 tablet (90 mg total) by mouth 2 (two) times daily. 180 tablet 1   No current facility-administered medications for this visit.    Allergies:   Review of patient's allergies indicates no known allergies.   Social History:  The patient  reports that he quit smoking about 32 years ago. He does not have any smokeless tobacco history on file. He reports that he drinks alcohol. He reports that he does not use illicit drugs.   Family History:  The patient's family history includes Arrhythmia in his sister; Liver cancer in his father; Thyroid cancer in his mother.   ROS:  General:no colds or fevers, no weight changes Skin:no rashes or ulcers HEENT:no blurred vision, no congestion CV:see HPI PUL:see HPI GI:no diarrhea constipation or melena, no indigestion GU:no hematuria, no dysuria MS:no joint pain, no claudication Neuro:no syncope, no lightheadedness  Endo:no diabetes, no thyroid disease  Wt Readings from Last 3 Encounters:  09/16/15 248 lb (112.492 kg)  05/10/15 247 lb (112.038 kg)  03/07/15 239 lb 12.8 oz (108.773 kg)    PHYSICAL EXAM: VS:  BP 152/84 mmHg  Pulse 60  Ht 5\' 11"  (1.803 m)  Wt 248 lb (112.492 kg)  BMI 34.60 kg/m2 , BMI Body mass index is 34.6 kg/(m^2). General:Pleasant affect, NAD Skin:Warm and dry, brisk capillary refill HEENT:normocephalic, sclera clear, mucus membranes moist Neck:supple, no JVD, no bruits  Heart:S1S2 RRR without murmur, gallup, rub or click Lungs:clear without rales, rhonchi, or wheezes ZOX:WRUEAbd:soft, non tender,  + BS, do not palpate liver spleen or masses Ext:no lower ext edema, 2+ pedal pulses, 2+ radial pulses Neuro:alert and oriented, MAE, follows commands, + facial symmetry  EKG:  EKG not done today.  Recent Labs: 03/01/2015: TSH 1.631 03/02/2015: BUN 13; Creatinine, Ser 0.79; Hemoglobin 13.0; Platelets 170; Potassium 3.8; Sodium 139    Lipid Panel    Component Value Date/Time   CHOL 157 03/01/2015 0905   TRIG 125 03/01/2015 0905   HDL 39* 03/01/2015 0905   CHOLHDL 4.0 03/01/2015 0905   VLDL 25 03/01/2015 0905   LDLCALC 93 03/01/2015 0905   Other studies Reviewed: Additional studies/ records that were reviewed today include: labs and below along with hospital notes.  ECHO: 05/20/2015 Study Conclusions - Left ventricle: The cavity size was normal. There was moderate concentric hypertrophy. Systolic function was mildly reduced. The estimated ejection fraction was in the range of 45% to 50%. Diffuse hypokinesis. Regional wall motion abnormalities cannot be excluded. - Mitral valve: There was mild regurgitation. - Left atrium: The atrium was moderately dilated.  Cardiac cath: Dominance: Right   Left Anterior Descending  . Vessel is large.   . Prox LAD lesion, 99% stenosed. discrete.   Marland Kitchen. PCI: The pre-interventional distal flow is normal (TIMI 3). Pre-stent angioplasty was performed. A drug-eluting stent was placed. The strut is apposed. Post-stent angioplasty was performed. The post-interventional distal flow is normal (TIMI 3). The intervention was successful. No complications occurred at this lesion.  . There is no residual stenosis post intervention.     . First Diagonal Branch   The vessel is moderate in size.   Marland Kitchen. Second Diagonal Branch   The vessel is small in size.     Left Circumflex  . Vessel is moderate in size. Vessel is angiographically normal.   . First Obtuse Marginal Branch   The vessel is small in size.   Marland Kitchen. Second Obtuse Marginal Branch   The vessel  is small in size.     Right Coronary Artery   . Mid RCA to Dist RCA lesion, 30% stenosed. diffuse.   . Right Posterior Descending Artery   . RPDA lesion, 20% stenosed. diffuse.       TTE: 03/02/15  Left ventricle: The cavity size was normal. There was moderate concentric hypertrophy. Systolic function was mildly reduced. The estimated ejection fraction was in the range of 45% to 50%. Diffuse hypokinesis. Regional wall motion abnormalities cannot be excluded. - Mitral valve: There was mild regurgitation. - Left atrium: The atrium was moderately dilated.  TTE: 05/2015   ASSESSMENT AND PLAN:  1.  CAD with recent NSTEMI with LAD stenosis having promus premier to LAD, mild residual RCA disease. Continue BB, ACEI, brilinta and asa.  He will call us for potential switch of brilinta to prasugrel, advised to continue either Brilinta or Prasugrel for 5 more months, then  we will switch to ASA/Plavix. SOB is mild and he can to tolerate it.   2. SOB - most probably sec to Brilinta, he will decide if he cant tolerate it, we would switch to Prasugrel  3. LV dysfunction with EF 45-50%, repeat in Jan 2017 normal LVEF 55-60%. Repeat prior to the next visit.  4. Hyperlipidemia on lipitor 80 mg, we will get labs from his PCP.  5. Essential HTN - controlled, white coat HTN in our office, he brings a diary - always in the 107-132 range.  Follow up in 6 months.  Signed, Tobias Alexander, MD  09/16/2015 8:41 AM    [facility] [address] [place], [place], [place]  [address]/ [address] [place], [place] Phone: [phone]; Fax: [phone]  [phone]

## 2015-09-16 NOTE — Patient Instructions (Signed)
Medication Instructions:   Your physician recommends that you continue on your current medications as directed. Please refer to the Current Medication list given to you today.    Testing/Procedures:  Your physician has requested that you have an echocardiogram. Echocardiography is a painless test that uses sound waves to create images of your heart. It provides your doctor with information about the size and shape of your heart and how well your heart's chambers and valves are working. This procedure takes approximately one hour. There are no restrictions for this procedure.  PER DR NELSON THIS ECHO SHOULD BE SCHEDULED FOR 5 MONTHS OUT, PRIOR TO THE PATIENTS 6 MONTH FOLLOW-UP APPOINTMENT WITH DR Delton SeeNELSON    Follow-Up:  Your physician wants you to follow-up in: 6 MONTHS WITH DR Johnell ComingsNELSON You will receive a reminder letter in the mail two months in advance. If you don't receive a letter, please call our office to schedule the follow-up appointment.       If you need a refill on your cardiac medications before your next appointment, please call your pharmacy.

## 2015-10-03 ENCOUNTER — Telehealth: Payer: Self-pay | Admitting: Pharmacist

## 2015-10-03 NOTE — Telephone Encounter (Signed)
Called pt to discuss sample recall of Brilinta.  He received 9 bottles from the same lot that was recalled on 5/15.  Informed pt of recall and to look for any blue tablets in his bottles.  He stated he was very careful and knew what the Brilinta should look like.  He will contact us if he notices any change in appearance.

## 2015-11-11 DIAGNOSIS — I10 Essential (primary) hypertension: Secondary | ICD-10-CM | POA: Diagnosis not present

## 2015-11-11 DIAGNOSIS — R7309 Other abnormal glucose: Secondary | ICD-10-CM | POA: Diagnosis not present

## 2015-11-11 DIAGNOSIS — E785 Hyperlipidemia, unspecified: Secondary | ICD-10-CM | POA: Diagnosis not present

## 2015-11-11 DIAGNOSIS — R972 Elevated prostate specific antigen [PSA]: Secondary | ICD-10-CM | POA: Diagnosis not present

## 2015-11-11 DIAGNOSIS — I214 Non-ST elevation (NSTEMI) myocardial infarction: Secondary | ICD-10-CM | POA: Diagnosis not present

## 2015-11-11 DIAGNOSIS — J309 Allergic rhinitis, unspecified: Secondary | ICD-10-CM | POA: Diagnosis not present

## 2016-02-05 ENCOUNTER — Other Ambulatory Visit: Payer: Self-pay | Admitting: Cardiology

## 2016-02-28 ENCOUNTER — Other Ambulatory Visit: Payer: Self-pay

## 2016-02-28 ENCOUNTER — Ambulatory Visit (HOSPITAL_COMMUNITY): Payer: PPO | Attending: Cardiovascular Disease

## 2016-02-28 DIAGNOSIS — I255 Ischemic cardiomyopathy: Secondary | ICD-10-CM | POA: Diagnosis not present

## 2016-02-28 DIAGNOSIS — Z955 Presence of coronary angioplasty implant and graft: Secondary | ICD-10-CM | POA: Diagnosis not present

## 2016-02-28 DIAGNOSIS — E785 Hyperlipidemia, unspecified: Secondary | ICD-10-CM | POA: Diagnosis not present

## 2016-02-28 DIAGNOSIS — I10 Essential (primary) hypertension: Secondary | ICD-10-CM | POA: Diagnosis not present

## 2016-02-28 DIAGNOSIS — I25110 Atherosclerotic heart disease of native coronary artery with unstable angina pectoris: Secondary | ICD-10-CM | POA: Diagnosis not present

## 2016-02-28 MED ORDER — PERFLUTREN LIPID MICROSPHERE
1.0000 mL | INTRAVENOUS | Status: AC | PRN
  Administered 2016-02-28: 3 mL via INTRAVENOUS

## 2016-03-04 ENCOUNTER — Telehealth: Payer: Self-pay | Admitting: Cardiology

## 2016-03-04 NOTE — Telephone Encounter (Signed)
Patient is taking Brilinta 90mg  1 tab BID--he has an appointment with Dr. Delton SeeNelson on 03/16/16 and he is hoping she is going to stop this medication, but he needs samples to carry him over until the appointment.  He does not want to refill his prescription if there is a chance he will no longer be taking the medication.

## 2016-03-04 NOTE — Telephone Encounter (Signed)
placed 4 bottles of brilinta 90 mg up front, LVM for pt

## 2016-03-16 ENCOUNTER — Encounter: Payer: Self-pay | Admitting: Cardiology

## 2016-03-16 ENCOUNTER — Ambulatory Visit (INDEPENDENT_AMBULATORY_CARE_PROVIDER_SITE_OTHER): Payer: PPO | Admitting: Cardiology

## 2016-03-16 VITALS — BP 122/62 | HR 63 | Ht 71.0 in | Wt 264.0 lb

## 2016-03-16 DIAGNOSIS — R0602 Shortness of breath: Secondary | ICD-10-CM

## 2016-03-16 DIAGNOSIS — I25110 Atherosclerotic heart disease of native coronary artery with unstable angina pectoris: Secondary | ICD-10-CM

## 2016-03-16 DIAGNOSIS — K219 Gastro-esophageal reflux disease without esophagitis: Secondary | ICD-10-CM | POA: Diagnosis not present

## 2016-03-16 DIAGNOSIS — E785 Hyperlipidemia, unspecified: Secondary | ICD-10-CM

## 2016-03-16 DIAGNOSIS — I519 Heart disease, unspecified: Secondary | ICD-10-CM

## 2016-03-16 DIAGNOSIS — J3089 Other allergic rhinitis: Secondary | ICD-10-CM | POA: Diagnosis not present

## 2016-03-16 DIAGNOSIS — Z955 Presence of coronary angioplasty implant and graft: Secondary | ICD-10-CM

## 2016-03-16 DIAGNOSIS — H1045 Other chronic allergic conjunctivitis: Secondary | ICD-10-CM | POA: Diagnosis not present

## 2016-03-16 DIAGNOSIS — I10 Essential (primary) hypertension: Secondary | ICD-10-CM | POA: Diagnosis not present

## 2016-03-16 LAB — COMPREHENSIVE METABOLIC PANEL
ALT: 29 U/L (ref 9–46)
AST: 27 U/L (ref 10–35)
Albumin: 4.4 g/dL (ref 3.6–5.1)
Alkaline Phosphatase: 76 U/L (ref 40–115)
BUN: 19 mg/dL (ref 7–25)
CO2: 28 mmol/L (ref 20–31)
Calcium: 9.3 mg/dL (ref 8.6–10.3)
Chloride: 101 mmol/L (ref 98–110)
Creat: 0.87 mg/dL (ref 0.70–1.25)
Glucose, Bld: 143 mg/dL — ABNORMAL HIGH (ref 65–99)
Potassium: 4.2 mmol/L (ref 3.5–5.3)
Sodium: 135 mmol/L (ref 135–146)
Total Bilirubin: 0.7 mg/dL (ref 0.2–1.2)
Total Protein: 6.9 g/dL (ref 6.1–8.1)

## 2016-03-16 LAB — CBC WITH DIFFERENTIAL/PLATELET
Basophils Absolute: 77 cells/uL (ref 0–200)
Basophils Relative: 1 %
Eosinophils Absolute: 231 cells/uL (ref 15–500)
Eosinophils Relative: 3 %
HCT: 38.3 % — ABNORMAL LOW (ref 38.5–50.0)
Hemoglobin: 12.9 g/dL — ABNORMAL LOW (ref 13.2–17.1)
Lymphocytes Relative: 19 %
Lymphs Abs: 1463 cells/uL (ref 850–3900)
MCH: 30.9 pg (ref 27.0–33.0)
MCHC: 33.7 g/dL (ref 32.0–36.0)
MCV: 91.8 fL (ref 80.0–100.0)
MPV: 9.7 fL (ref 7.5–12.5)
Monocytes Absolute: 770 cells/uL (ref 200–950)
Monocytes Relative: 10 %
Neutro Abs: 5159 cells/uL (ref 1500–7800)
Neutrophils Relative %: 67 %
Platelets: 229 10*3/uL (ref 140–400)
RBC: 4.17 MIL/uL — ABNORMAL LOW (ref 4.20–5.80)
RDW: 13 % (ref 11.0–15.0)
WBC: 7.7 10*3/uL (ref 3.8–10.8)

## 2016-03-16 LAB — LIPID PANEL
Cholesterol: 111 mg/dL (ref <200)
HDL: 25 mg/dL — ABNORMAL LOW (ref >40)
LDL Cholesterol: 41 mg/dL (ref <100)
Total CHOL/HDL Ratio: 4.4 Ratio (ref <5.0)
Triglycerides: 225 mg/dL — ABNORMAL HIGH (ref <150)
VLDL: 45 mg/dL — ABNORMAL HIGH (ref <30)

## 2016-03-16 MED ORDER — CLOPIDOGREL BISULFATE 75 MG PO TABS: 75.0000 mg | ORAL_TABLET | Freq: Every day | ORAL | 3 refills | Status: DC

## 2016-03-16 NOTE — Progress Notes (Signed)
Patient ID: Alex Jackson, male   DOB: 10/27/1945, 70 y.o.   MRN: [id number]    Cardiology Office Note  Date:  03/16/2016   ID:  Alex Jackson, DOB 20-Apr-1946, MRN [id number]  PCP:  Alex Has, MD  Cardiologist:  Dr. Delton See    Chief complain: SOB   History of Present Illness: Alex Jackson is a 70 y.o. male who presents for post hospitalization for NSTEMI-pk troponin 0.17, cath with findings of severe stenosis of the proximal LAD and underwent successful PCI + DES- Promus premier.   Minimal disease in the RCA,  EF of 45-50%.  Marland Kitchen He was placed on DAPT with ASA + Brilinta. He was continued on BB, ACE-I and statin.  D/C'd 03/02/15.    09/16/15 - 6 months follow up, walks at least 10.000 steps a day, no chest pain, SOB, claudications, orthopnea, PND< no syncope. No muscle pain from atorvastatin. Normal labs in  03/2015, done again at the PCP office in March.  Compliant with his meds. Continue to have mild SOB with Brilinta. Complains of chronic back pain.  03/16/2016 - 6 months follow-up, the patient states that he feels great until 2 weeks ago he Jackson been walking 3 miles a day no shortness of breath chest pain palpitations dizziness or claudications. He developed sinusitis requiring steroids gained some weight he is trying to lose now. He also Jackson back problems. He's been compliant to his meds and states that his legs feel stiff every morning. He denies any lower extremity edema orthopnea or proximal nocturnal dyspnea.  Past Medical History:  Diagnosis Date  . CAD in native artery 03/01/15   DES to LAD residual non obstructive RCA disease.  Marland Kitchen Hyperlipidemia   . Hypertension    Past Surgical History:  Procedure Laterality Date  . CARDIAC CATHETERIZATION N/A 03/01/2015   Procedure: Left Heart Cath and Coronary Angiography;  Surgeon: Kathleene Hazel, MD;  Location: [facility] INVASIVE CV LAB;  Service: Cardiovascular;  Laterality: N/A;  . NO PAST SURGERIES     Current Outpatient  Prescriptions  Medication Sig Dispense Refill  . aspirin EC 81 MG tablet Take 81 mg by mouth daily.    Marland Kitchen atorvastatin (LIPITOR) 80 MG tablet Take 1 tablet (80 mg total) by mouth daily. 180 tablet 5  . bisoprolol (ZEBETA) 10 MG tablet Take 10 mg by mouth daily.    Marland Kitchen BRILINTA 90 MG TABS tablet TAKE 1 TABLET (90 MG TOTAL) BY MOUTH 2 (TWO) TIMES DAILY. 180 tablet 2  . fluticasone (FLONASE) 50 MCG/ACT nasal spray USE 1 SPRAY IN EACH NOSTRIL EVERY DAY  4  . levocetirizine (XYZAL) 5 MG tablet Take 5 mg by mouth every evening.    Marland Kitchen lisinopril-hydrochlorothiazide (PRINZIDE,ZESTORETIC) 20-12.5 MG tablet Take 1 tablet by mouth daily.    . Multiple Vitamin (MULTIVITAMIN WITH MINERALS) TABS tablet Take 1 tablet by mouth daily.    . nitroGLYCERIN (NITROSTAT) 0.4 MG SL tablet Place 1 tablet (0.4 mg total) under the tongue every 5 (five) minutes x 3 doses as needed for chest pain. 25 tablet 2  . omeprazole (PRILOSEC) 20 MG capsule Take 20 mg by mouth daily.     No current facility-administered medications for this visit.     Allergies:   Patient Jackson no known allergies.   Social History:  The patient  reports that he quit smoking about 32 years ago. He smoked 1.00 pack per day. He does not have any smokeless tobacco history on file. He reports  that he drinks alcohol. He reports that he does not use drugs.   Family History:  The patient's family history includes Arrhythmia in his sister; Liver cancer in his father; Thyroid cancer in his mother.   ROS:  General:no colds or fevers, no weight changes Skin:no rashes or ulcers HEENT:no blurred vision, no congestion CV:see HPI PUL:see HPI GI:no diarrhea constipation or melena, no indigestion GU:no hematuria, no dysuria MS:no joint pain, no claudication Neuro:no syncope, no lightheadedness Endo:no diabetes, no thyroid disease  Wt Readings from Last 3 Encounters:  09/16/15 248 lb (112.5 kg)  05/10/15 247 lb (112 kg)  03/07/15 239 lb 12.8 oz (108.8 kg)      PHYSICAL EXAM: VS:  There were no vitals taken for this visit. , BMI There is no height or weight on file to calculate BMI. General:Pleasant affect, NAD Skin:Warm and dry, brisk capillary refill HEENT:normocephalic, sclera clear, mucus membranes moist Neck:supple, no JVD, no bruits  Heart:S1S2 RRR without murmur, gallup, rub or click Lungs:clear without rales, rhonchi, or wheezes RUE:AVWUAbd:soft, non tender, + BS, do not palpate liver spleen or masses Ext:no lower ext edema, 2+ pedal pulses, 2+ radial pulses Neuro:alert and oriented, MAE, follows commands, + facial symmetry  EKG:  EKG not done today.  Recent Labs: No results found for requested labs within last 8760 hours.    Lipid Panel    Component Value Date/Time   CHOL 157 03/01/2015 0905   TRIG 125 03/01/2015 0905   HDL 39 (L) 03/01/2015 0905   CHOLHDL 4.0 03/01/2015 0905   VLDL 25 03/01/2015 0905   LDLCALC 93 03/01/2015 0905   Other studies Reviewed: Additional studies/ records that were reviewed today include: labs and below along with hospital notes.  ECHO: 05/20/2015 Study Conclusions - Left ventricle: The cavity size was normal. There was moderate concentric hypertrophy. Systolic function was mildly reduced. The estimated ejection fraction was in the range of 45% to 50%. Diffuse hypokinesis. Regional wall motion abnormalities cannot be excluded. - Mitral valve: There was mild regurgitation. - Left atrium: The atrium was moderately dilated.  Cardiac cath: Dominance: Right   Left Anterior Descending  . Vessel is large.   . Prox LAD lesion, 99% stenosed. discrete.   Marland Kitchen. PCI: The pre-interventional distal flow is normal (TIMI 3). Pre-stent angioplasty was performed. A drug-eluting stent was placed. The strut is apposed. Post-stent angioplasty was performed. The post-interventional distal flow is normal (TIMI 3). The intervention was successful. No complications occurred at this lesion.  . There is no residual  stenosis post intervention.     . First Diagonal Branch   The vessel is moderate in size.   Marland Kitchen. Second Diagonal Branch   The vessel is small in size.     Left Circumflex  . Vessel is moderate in size. Vessel is angiographically normal.   . First Obtuse Marginal Branch   The vessel is small in size.   Marland Kitchen. Second Obtuse Marginal Branch   The vessel is small in size.     Right Coronary Artery   . Mid RCA to Dist RCA lesion, 30% stenosed. diffuse.   . Right Posterior Descending Artery   . RPDA lesion, 20% stenosed. diffuse.       TTE: 03/02/15  Left ventricle: The cavity size was normal. There was moderate concentric hypertrophy. Systolic function was mildly reduced. The estimated ejection fraction was in the range of 45% to 50%. Diffuse hypokinesis. Regional wall motion abnormalities cannot be excluded. - Mitral valve: There was mild  regurgitation. - Left atrium: The atrium was moderately dilated.  TTE: 02/2016 Left ventricle: The cavity size was normal. There was mild   concentric hypertrophy. Systolic function was normal. The   estimated ejection fraction was in the range of 55% to 60%. Wall   motion was normal; there were no regional wall motion   abnormalities. Doppler parameters are consistent with abnormal   left ventricular relaxation (grade 1 diastolic dysfunction).   Acoustic contrast opacification revealed no evidence ofthrombus. - Left atrium: The atrium was mildly to moderately dilated.  EKG performed today 03/16/2016 shows normal sinus rhythm normal EKG unchanged from prior.    ASSESSMENT AND PLAN:  1.  CAD with recent NSTEMI with LAD stenosis having promus premier to LAD, mild residual RCA disease on a cath in 02/2015. Continue BB, ACEI, brilinta and asa.  We will switch Brilinta to ASA/Plavix. He is asymptomatic and no ischemia workup needed at this point.  2. SOB - most probably sec to Brilinta, we will now switch to Plavix.   3. LV dysfunction  with EF 45-50%, repeat in Jan 2017 normal LVEF 55-60%. Repeat in 10/17 remains normal.  4. Hyperlipidemia on lipitor 80 mg, we will recheck his lipids and CMP today however he seems to have muscle pain from Lipitor some based on results will try to switch to different statin.  5. Essential HTN - controlled.  Follow up in 1 year.  Signed, Tobias AlexanderKatarina Pershing Skidmore, MD  03/16/2016 8:22 AM    [facility] [address], [place], [place]  [address]/ [address] [place], [place] Phone: [phone]([phone]; Fax: [phone]) [phone]  [phone]

## 2016-03-16 NOTE — Patient Instructions (Signed)
Medication Instructions:   STOP TAKING BRILINTA NOW   START TAKING PLAVIX 75 MG ONCE DAILY   Labwork:  TODAY--CMET, CBC W DIFF, AND LIPIDS    Follow-Up:  Your physician wants you to follow-up in: ONE YEAR WITH DR Johnell ComingsNELSON You will receive a reminder letter in the mail two months in advance. If you don't receive a letter, please call our office to schedule the follow-up appointment.      If you need a refill on your cardiac medications before your next appointment, please call your pharmacy.

## 2016-03-18 ENCOUNTER — Telehealth: Payer: Self-pay | Admitting: *Deleted

## 2016-03-18 NOTE — Telephone Encounter (Signed)
Will tentatively change the pts atorvastatin dose to 40 mg po daily, for he states that what Dr Delton SeeNelson recommended he do at his 11/13 OV with her.

## 2016-03-18 NOTE — Telephone Encounter (Signed)
  Pt did however request for me to clarify with Dr Delton SeeNelson, if he should cut his atorvastatin in 1/2 (from 80 mg to 40 mg po daily), as she discussed with him at his 11/13 OV with her? Pt states Dr Delton SeeNelson advised him to cut this in 1/2 and try this out for a couple of weeks, to see if this helps alleviate his complaints of lower leg cramps.  Informed the pt that reading over Dr Lindaann SloughNelson's note, she noted that she will assess his lipids and consider switching his statin, for complaints mentioned.  Advised the pt that if she did tell him to cut his atorvastatin to 40 mg po daily, then he should do so until further advised. Also advised the pt, that if after a couple of weeks of trying this recommendation and he notices no improvement of his lower leg cramps, then he should contact our office back, to receive further recommendations.  Informed the pt that I will route this note back to Dr Delton SeeNelson to review and advise on, and/or clarify if she just wants him to cut this med in 1/2 for right now. Advised the pt that if she wants him to 1/2 this med, he should then call us back in a few weeks to report if his leg cramps have improved or not. Informed the pt that I will follow-up with him with any new recommendations provided.  Pt verbalized understanding and agrees with

## 2016-03-19 ENCOUNTER — Telehealth: Payer: Self-pay | Admitting: *Deleted

## 2016-03-19 MED ORDER — ATORVASTATIN CALCIUM 40 MG PO TABS: 40.0000 mg | ORAL_TABLET | Freq: Every day | ORAL | 3 refills | Status: DC

## 2016-03-19 NOTE — Telephone Encounter (Signed)
Spoke with the pt to endorse Dr Lindaann SloughNelson's recommendations for him to d/c the atorvastatin, and start taking pravastatin 40 mg po daily, and call us in 1 month to report his current symptoms.  Per the pt, he states that since she decreased his atorvastatin from 80 to 40 mg, he feels "90 % better with his muscle cramps in his legs." Pt states he would like to continue trying the atorvastatin 40 mg daily, and he will call us in one month to report his current symptoms if any, and request for further refills.  Pt gracious for all the assistance provided.

## 2016-03-19 NOTE — Progress Notes (Signed)
Please try pravastatin 40 mg po daily and have him call us in 1 month to see how are his symptoms.

## 2016-03-19 NOTE — Telephone Encounter (Signed)
-----   Message from Lars MassonKatarina H Nelson, MD sent at 03/19/2016  1:33 PM EST ----- Lets try pravastatin 40 mg po daily and have him call in 1 months to see how her his symptoms.

## 2016-03-30 ENCOUNTER — Telehealth: Payer: Self-pay | Admitting: Cardiology

## 2016-03-30 DIAGNOSIS — L1230 Acquired epidermolysis bullosa, unspecified: Secondary | ICD-10-CM | POA: Diagnosis not present

## 2016-03-30 DIAGNOSIS — R972 Elevated prostate specific antigen [PSA]: Secondary | ICD-10-CM | POA: Diagnosis not present

## 2016-03-30 DIAGNOSIS — R7309 Other abnormal glucose: Secondary | ICD-10-CM | POA: Diagnosis not present

## 2016-03-30 DIAGNOSIS — E785 Hyperlipidemia, unspecified: Secondary | ICD-10-CM | POA: Diagnosis not present

## 2016-03-30 DIAGNOSIS — Z23 Encounter for immunization: Secondary | ICD-10-CM | POA: Diagnosis not present

## 2016-03-30 DIAGNOSIS — Z0000 Encounter for general adult medical examination without abnormal findings: Secondary | ICD-10-CM | POA: Diagnosis not present

## 2016-03-30 DIAGNOSIS — I10 Essential (primary) hypertension: Secondary | ICD-10-CM | POA: Diagnosis not present

## 2016-03-30 MED ORDER — PRAVASTATIN SODIUM 40 MG PO TABS: 40.0000 mg | ORAL_TABLET | Freq: Every evening | ORAL | 3 refills | Status: DC

## 2016-03-30 NOTE — Telephone Encounter (Signed)
Follow Up:   Pt says he wants you to go ahead and change his Atorvastatin.

## 2016-03-30 NOTE — Telephone Encounter (Signed)
I spoke with pt and previous phone notes indicated pt was going to be changed to pravastatin 40 mg daily but pt felt leg pain had improved on decreased dose of atorvastatin and he wanted to continue lower dose of atorvastatin. Today pt reports leg pain improved but did not go away completely. He would like to change to pravastatin.  [facility] [address]. He requests 90 day supply.  Pt aware to stop Atorvastatin and call us in one month with an update.

## 2016-04-24 ENCOUNTER — Other Ambulatory Visit: Payer: Self-pay | Admitting: Cardiology

## 2016-05-26 DIAGNOSIS — H524 Presbyopia: Secondary | ICD-10-CM | POA: Diagnosis not present

## 2016-06-09 DIAGNOSIS — J069 Acute upper respiratory infection, unspecified: Secondary | ICD-10-CM | POA: Diagnosis not present

## 2016-06-09 DIAGNOSIS — R509 Fever, unspecified: Secondary | ICD-10-CM | POA: Diagnosis not present

## 2016-06-09 DIAGNOSIS — R062 Wheezing: Secondary | ICD-10-CM | POA: Diagnosis not present

## 2016-07-03 DIAGNOSIS — M4726 Other spondylosis with radiculopathy, lumbar region: Secondary | ICD-10-CM | POA: Diagnosis not present

## 2016-07-03 DIAGNOSIS — M5431 Sciatica, right side: Secondary | ICD-10-CM | POA: Diagnosis not present

## 2016-07-03 DIAGNOSIS — M5432 Sciatica, left side: Secondary | ICD-10-CM | POA: Diagnosis not present

## 2016-07-21 ENCOUNTER — Other Ambulatory Visit: Payer: Self-pay | Admitting: Orthopedic Surgery

## 2016-07-21 DIAGNOSIS — M4726 Other spondylosis with radiculopathy, lumbar region: Secondary | ICD-10-CM

## 2016-07-28 DIAGNOSIS — R972 Elevated prostate specific antigen [PSA]: Secondary | ICD-10-CM | POA: Diagnosis not present

## 2016-07-28 DIAGNOSIS — R7309 Other abnormal glucose: Secondary | ICD-10-CM | POA: Diagnosis not present

## 2016-07-28 DIAGNOSIS — I10 Essential (primary) hypertension: Secondary | ICD-10-CM | POA: Diagnosis not present

## 2016-07-28 DIAGNOSIS — E785 Hyperlipidemia, unspecified: Secondary | ICD-10-CM | POA: Diagnosis not present

## 2016-08-01 ENCOUNTER — Ambulatory Visit
Admission: RE | Admit: 2016-08-01 | Discharge: 2016-08-01 | Disposition: A | Discharge status: Home / Self Care | Payer: PPO | Source: Ambulatory Visit | Attending: Orthopedic Surgery | Admitting: Orthopedic Surgery

## 2016-08-01 DIAGNOSIS — M48061 Spinal stenosis, lumbar region without neurogenic claudication: Secondary | ICD-10-CM | POA: Diagnosis not present

## 2016-08-01 DIAGNOSIS — M4726 Other spondylosis with radiculopathy, lumbar region: Secondary | ICD-10-CM

## 2016-08-14 DIAGNOSIS — M4726 Other spondylosis with radiculopathy, lumbar region: Secondary | ICD-10-CM | POA: Diagnosis not present

## 2016-08-14 DIAGNOSIS — M48062 Spinal stenosis, lumbar region with neurogenic claudication: Secondary | ICD-10-CM | POA: Diagnosis not present

## 2016-08-14 DIAGNOSIS — M5431 Sciatica, right side: Secondary | ICD-10-CM | POA: Diagnosis not present

## 2016-08-14 DIAGNOSIS — M5432 Sciatica, left side: Secondary | ICD-10-CM | POA: Diagnosis not present

## 2016-09-21 DIAGNOSIS — R062 Wheezing: Secondary | ICD-10-CM | POA: Diagnosis not present

## 2016-09-21 DIAGNOSIS — J3089 Other allergic rhinitis: Secondary | ICD-10-CM | POA: Diagnosis not present

## 2016-09-21 DIAGNOSIS — H1045 Other chronic allergic conjunctivitis: Secondary | ICD-10-CM | POA: Diagnosis not present

## 2016-09-21 DIAGNOSIS — K219 Gastro-esophageal reflux disease without esophagitis: Secondary | ICD-10-CM | POA: Diagnosis not present

## 2016-09-29 DIAGNOSIS — R972 Elevated prostate specific antigen [PSA]: Secondary | ICD-10-CM | POA: Diagnosis not present

## 2016-10-01 DIAGNOSIS — R972 Elevated prostate specific antigen [PSA]: Secondary | ICD-10-CM | POA: Diagnosis not present

## 2016-10-14 DIAGNOSIS — R05 Cough: Secondary | ICD-10-CM | POA: Diagnosis not present

## 2016-10-14 DIAGNOSIS — J329 Chronic sinusitis, unspecified: Secondary | ICD-10-CM | POA: Diagnosis not present

## 2016-11-27 ENCOUNTER — Telehealth: Payer: Self-pay | Admitting: Cardiology

## 2016-11-27 DIAGNOSIS — E785 Hyperlipidemia, unspecified: Secondary | ICD-10-CM

## 2016-11-27 DIAGNOSIS — M4726 Other spondylosis with radiculopathy, lumbar region: Secondary | ICD-10-CM | POA: Diagnosis not present

## 2016-11-27 DIAGNOSIS — E119 Type 2 diabetes mellitus without complications: Secondary | ICD-10-CM | POA: Diagnosis not present

## 2016-11-27 DIAGNOSIS — I10 Essential (primary) hypertension: Secondary | ICD-10-CM | POA: Diagnosis not present

## 2016-11-27 DIAGNOSIS — R972 Elevated prostate specific antigen [PSA]: Secondary | ICD-10-CM | POA: Diagnosis not present

## 2016-11-27 DIAGNOSIS — M48062 Spinal stenosis, lumbar region with neurogenic claudication: Secondary | ICD-10-CM | POA: Diagnosis not present

## 2016-11-27 DIAGNOSIS — M79605 Pain in left leg: Secondary | ICD-10-CM | POA: Diagnosis not present

## 2016-11-27 DIAGNOSIS — I25110 Atherosclerotic heart disease of native coronary artery with unstable angina pectoris: Secondary | ICD-10-CM

## 2016-11-27 DIAGNOSIS — M5432 Sciatica, left side: Secondary | ICD-10-CM | POA: Diagnosis not present

## 2016-11-27 NOTE — Telephone Encounter (Signed)
Patient called back. Informed patient that Dr. Delton SeeNelson is out of the office this week. Informed patient that his message would be sent to Dr. Delton SeeNelson and her nurse, they would get back with him next week. Patient verbalized understanding.

## 2016-11-27 NOTE — Telephone Encounter (Signed)
°  New Prob  Pt is due to have an injection into the L lower hip 8/13. However, pt would like to speak to Dr. Delton SeeNelson prior to undergoing injection. Please call.

## 2016-11-27 NOTE — Telephone Encounter (Signed)
Left message for patient to call back  

## 2016-11-30 NOTE — Telephone Encounter (Signed)
There is no contraindication from cardiac standpoint, the patient can go ahead and receive the injections.

## 2016-11-30 NOTE — Telephone Encounter (Signed)
Pt aware of Dr Lindaann SloughNelson's recommendations to receive injections.  Pt verbalized understanding and agrees with this plan.   Pt did however want me to make Dr Delton SeeNelson aware that he is having bilateral lower calf pain while taking pravastatin.  Pt states that its cramping in the calf area and he would like for Dr Delton SeeNelson to advise if he should stop taking pravastatin, or be switched to a different regimen.  Pt states this happened to him on atorvastatin as well, and that's why she switched him to pravastatin.  Informed the pt that I will route this request to Dr Delton SeeNelson to review and advise on, and follow-up with the pt thereafter.  Pt verbalized understanding and agrees with this plan.

## 2016-11-30 NOTE — Telephone Encounter (Signed)
Please try rosuvastatin 5 mg po daily instead, check, CKMB and lipids in 1 month.

## 2016-12-01 MED ORDER — ROSUVASTATIN CALCIUM 5 MG PO TABS: 5.0000 mg | ORAL_TABLET | Freq: Every day | ORAL | 1 refills | Status: DC

## 2016-12-01 NOTE — Telephone Encounter (Signed)
Spoke with the pt and informed him that Dr Delton SeeNelson recommends that we discontinue his pravastatin, and start him on rosuvastatin 5 mg po daily, and check a CKMB and Lipids in one month.  Informed the pt that I will send in a months supply of this med to his pharmacy of choice, to make sure he tolerates this appropriately or not.  Scheduled the pts lab appt for one month out on 8/31, to recheck CKMB and lipids.  Pt is aware to come fasting to this lab appt.  Updated the pts pravastatin in his allergies as an intolerance.  Pt verbalized understanding and agrees with this plan.

## 2016-12-30 DIAGNOSIS — R972 Elevated prostate specific antigen [PSA]: Secondary | ICD-10-CM | POA: Diagnosis not present

## 2017-01-01 ENCOUNTER — Other Ambulatory Visit: Payer: PPO | Admitting: *Deleted

## 2017-01-01 DIAGNOSIS — E785 Hyperlipidemia, unspecified: Secondary | ICD-10-CM | POA: Diagnosis not present

## 2017-01-01 DIAGNOSIS — I25110 Atherosclerotic heart disease of native coronary artery with unstable angina pectoris: Secondary | ICD-10-CM | POA: Diagnosis not present

## 2017-01-05 DIAGNOSIS — N5201 Erectile dysfunction due to arterial insufficiency: Secondary | ICD-10-CM | POA: Diagnosis not present

## 2017-01-05 DIAGNOSIS — R972 Elevated prostate specific antigen [PSA]: Secondary | ICD-10-CM | POA: Diagnosis not present

## 2017-01-05 LAB — SPECIMEN STATUS

## 2017-01-26 ENCOUNTER — Other Ambulatory Visit: Payer: Self-pay | Admitting: Cardiology

## 2017-01-26 DIAGNOSIS — E785 Hyperlipidemia, unspecified: Secondary | ICD-10-CM

## 2017-01-26 DIAGNOSIS — I25110 Atherosclerotic heart disease of native coronary artery with unstable angina pectoris: Secondary | ICD-10-CM

## 2017-01-26 MED ORDER — ROSUVASTATIN CALCIUM 5 MG PO TABS: 5.0000 mg | ORAL_TABLET | Freq: Every day | ORAL | 1 refills | Status: DC

## 2017-02-11 DIAGNOSIS — M7631 Iliotibial band syndrome, right leg: Secondary | ICD-10-CM | POA: Insufficient documentation

## 2017-02-11 DIAGNOSIS — M545 Low back pain, unspecified: Secondary | ICD-10-CM | POA: Insufficient documentation

## 2017-02-11 DIAGNOSIS — M47816 Spondylosis without myelopathy or radiculopathy, lumbar region: Secondary | ICD-10-CM | POA: Diagnosis not present

## 2017-02-11 DIAGNOSIS — Z8639 Personal history of other endocrine, nutritional and metabolic disease: Secondary | ICD-10-CM | POA: Insufficient documentation

## 2017-02-11 DIAGNOSIS — M5442 Lumbago with sciatica, left side: Secondary | ICD-10-CM | POA: Diagnosis not present

## 2017-02-11 DIAGNOSIS — M7632 Iliotibial band syndrome, left leg: Secondary | ICD-10-CM

## 2017-02-11 DIAGNOSIS — G8929 Other chronic pain: Secondary | ICD-10-CM | POA: Insufficient documentation

## 2017-02-11 DIAGNOSIS — I10 Essential (primary) hypertension: Secondary | ICD-10-CM | POA: Diagnosis not present

## 2017-02-11 DIAGNOSIS — I25110 Atherosclerotic heart disease of native coronary artery with unstable angina pectoris: Secondary | ICD-10-CM | POA: Diagnosis present

## 2017-02-11 LAB — LIPID PANEL
Chol/HDL Ratio: 3.9 ratio (ref 0.0–5.0)
Cholesterol, Total: 137 mg/dL (ref 100–199)
HDL: 35 mg/dL — ABNORMAL LOW (ref >39)
LDL Calculated: 76 mg/dL (ref 0–99)
Triglycerides: 130 mg/dL (ref 0–149)
VLDL Cholesterol Cal: 26 mg/dL (ref 5–40)

## 2017-02-11 LAB — CREATININE KINASE MB: CK-MB Index: 1.3 ng/mL (ref 0.0–10.4)

## 2017-03-09 DIAGNOSIS — R29898 Other symptoms and signs involving the musculoskeletal system: Secondary | ICD-10-CM | POA: Diagnosis not present

## 2017-03-12 DIAGNOSIS — M5416 Radiculopathy, lumbar region: Secondary | ICD-10-CM | POA: Diagnosis not present

## 2017-03-16 DIAGNOSIS — M5416 Radiculopathy, lumbar region: Secondary | ICD-10-CM | POA: Diagnosis not present

## 2017-03-18 ENCOUNTER — Other Ambulatory Visit: Payer: Self-pay | Admitting: Cardiology

## 2017-03-18 DIAGNOSIS — I10 Essential (primary) hypertension: Secondary | ICD-10-CM

## 2017-03-18 DIAGNOSIS — I25110 Atherosclerotic heart disease of native coronary artery with unstable angina pectoris: Secondary | ICD-10-CM

## 2017-03-18 DIAGNOSIS — E785 Hyperlipidemia, unspecified: Secondary | ICD-10-CM

## 2017-03-19 DIAGNOSIS — M5416 Radiculopathy, lumbar region: Secondary | ICD-10-CM | POA: Diagnosis not present

## 2017-03-23 DIAGNOSIS — M5416 Radiculopathy, lumbar region: Secondary | ICD-10-CM | POA: Diagnosis not present

## 2017-03-30 ENCOUNTER — Other Ambulatory Visit: Payer: Self-pay | Admitting: Cardiology

## 2017-03-30 DIAGNOSIS — E785 Hyperlipidemia, unspecified: Secondary | ICD-10-CM

## 2017-03-30 DIAGNOSIS — I25110 Atherosclerotic heart disease of native coronary artery with unstable angina pectoris: Secondary | ICD-10-CM

## 2017-04-01 DIAGNOSIS — M5416 Radiculopathy, lumbar region: Secondary | ICD-10-CM | POA: Diagnosis not present

## 2017-04-05 DIAGNOSIS — M5416 Radiculopathy, lumbar region: Secondary | ICD-10-CM | POA: Diagnosis not present

## 2017-04-08 DIAGNOSIS — M5416 Radiculopathy, lumbar region: Secondary | ICD-10-CM | POA: Diagnosis not present

## 2017-04-15 DIAGNOSIS — M5416 Radiculopathy, lumbar region: Secondary | ICD-10-CM | POA: Diagnosis not present

## 2017-04-16 DIAGNOSIS — Z23 Encounter for immunization: Secondary | ICD-10-CM | POA: Diagnosis not present

## 2017-04-16 DIAGNOSIS — E785 Hyperlipidemia, unspecified: Secondary | ICD-10-CM | POA: Diagnosis not present

## 2017-04-16 DIAGNOSIS — E119 Type 2 diabetes mellitus without complications: Secondary | ICD-10-CM | POA: Diagnosis not present

## 2017-04-16 DIAGNOSIS — I10 Essential (primary) hypertension: Secondary | ICD-10-CM | POA: Diagnosis not present

## 2017-04-16 DIAGNOSIS — M5430 Sciatica, unspecified side: Secondary | ICD-10-CM | POA: Diagnosis not present

## 2017-04-16 DIAGNOSIS — Z0000 Encounter for general adult medical examination without abnormal findings: Secondary | ICD-10-CM | POA: Diagnosis not present

## 2017-04-16 DIAGNOSIS — R972 Elevated prostate specific antigen [PSA]: Secondary | ICD-10-CM | POA: Diagnosis not present

## 2017-04-19 DIAGNOSIS — R062 Wheezing: Secondary | ICD-10-CM | POA: Diagnosis not present

## 2017-04-19 DIAGNOSIS — J3089 Other allergic rhinitis: Secondary | ICD-10-CM | POA: Diagnosis not present

## 2017-04-19 DIAGNOSIS — H1045 Other chronic allergic conjunctivitis: Secondary | ICD-10-CM | POA: Diagnosis not present

## 2017-04-19 DIAGNOSIS — K219 Gastro-esophageal reflux disease without esophagitis: Secondary | ICD-10-CM | POA: Diagnosis not present

## 2017-04-20 DIAGNOSIS — M5416 Radiculopathy, lumbar region: Secondary | ICD-10-CM | POA: Diagnosis not present

## 2017-05-05 DIAGNOSIS — M5416 Radiculopathy, lumbar region: Secondary | ICD-10-CM | POA: Diagnosis not present

## 2017-05-11 DIAGNOSIS — M5416 Radiculopathy, lumbar region: Secondary | ICD-10-CM | POA: Diagnosis not present

## 2017-05-13 DIAGNOSIS — M5442 Lumbago with sciatica, left side: Secondary | ICD-10-CM | POA: Diagnosis not present

## 2017-05-13 DIAGNOSIS — I10 Essential (primary) hypertension: Secondary | ICD-10-CM | POA: Diagnosis not present

## 2017-05-13 DIAGNOSIS — G5702 Lesion of sciatic nerve, left lower limb: Secondary | ICD-10-CM | POA: Diagnosis not present

## 2017-05-13 DIAGNOSIS — G8929 Other chronic pain: Secondary | ICD-10-CM | POA: Diagnosis not present

## 2017-05-13 DIAGNOSIS — Z23 Encounter for immunization: Secondary | ICD-10-CM | POA: Diagnosis not present

## 2017-05-20 DIAGNOSIS — M5416 Radiculopathy, lumbar region: Secondary | ICD-10-CM | POA: Diagnosis not present

## 2017-05-26 ENCOUNTER — Other Ambulatory Visit: Payer: Self-pay | Admitting: Cardiology

## 2017-05-26 DIAGNOSIS — I25110 Atherosclerotic heart disease of native coronary artery with unstable angina pectoris: Secondary | ICD-10-CM

## 2017-05-26 DIAGNOSIS — E785 Hyperlipidemia, unspecified: Secondary | ICD-10-CM

## 2017-06-03 ENCOUNTER — Ambulatory Visit: Payer: PPO | Admitting: Cardiology

## 2017-06-06 ENCOUNTER — Other Ambulatory Visit: Payer: Self-pay | Admitting: Cardiology

## 2017-06-06 DIAGNOSIS — I25110 Atherosclerotic heart disease of native coronary artery with unstable angina pectoris: Secondary | ICD-10-CM

## 2017-06-06 DIAGNOSIS — E785 Hyperlipidemia, unspecified: Secondary | ICD-10-CM

## 2017-06-07 ENCOUNTER — Ambulatory Visit: Payer: PPO | Admitting: Cardiology

## 2017-06-07 ENCOUNTER — Encounter: Payer: Self-pay | Admitting: Cardiology

## 2017-06-07 VITALS — BP 132/78 | HR 60 | Ht 71.5 in | Wt 270.2 lb

## 2017-06-07 DIAGNOSIS — I2510 Atherosclerotic heart disease of native coronary artery without angina pectoris: Secondary | ICD-10-CM | POA: Diagnosis not present

## 2017-06-07 DIAGNOSIS — E785 Hyperlipidemia, unspecified: Secondary | ICD-10-CM

## 2017-06-07 DIAGNOSIS — I25110 Atherosclerotic heart disease of native coronary artery with unstable angina pectoris: Secondary | ICD-10-CM | POA: Diagnosis not present

## 2017-06-07 DIAGNOSIS — I10 Essential (primary) hypertension: Secondary | ICD-10-CM | POA: Diagnosis not present

## 2017-06-07 DIAGNOSIS — Z955 Presence of coronary angioplasty implant and graft: Secondary | ICD-10-CM

## 2017-06-07 DIAGNOSIS — I255 Ischemic cardiomyopathy: Secondary | ICD-10-CM

## 2017-06-07 MED ORDER — FUROSEMIDE 20 MG PO TABS: 20.0000 mg | ORAL_TABLET | Freq: Every day | ORAL | 6 refills | Status: DC | PRN

## 2017-06-07 MED ORDER — LISINOPRIL-HYDROCHLOROTHIAZIDE 20-12.5 MG PO TABS: 1.0000 | ORAL_TABLET | Freq: Every day | ORAL | 3 refills | Status: DC

## 2017-06-07 MED ORDER — CLOPIDOGREL BISULFATE 75 MG PO TABS: 75.0000 mg | ORAL_TABLET | Freq: Every day | ORAL | 3 refills | Status: DC

## 2017-06-07 MED ORDER — ROSUVASTATIN CALCIUM 5 MG PO TABS: 5.0000 mg | ORAL_TABLET | Freq: Every day | ORAL | 0 refills | Status: DC

## 2017-06-07 MED ORDER — BISOPROLOL FUMARATE 10 MG PO TABS: 10.0000 mg | ORAL_TABLET | Freq: Every day | ORAL | 3 refills | Status: DC

## 2017-06-07 NOTE — Patient Instructions (Signed)

## 2017-06-07 NOTE — Progress Notes (Signed)
Patient ID: Alex Jackson, male   DOB: 1945-05-18, 72 y.o.   MRN: [id number]    Cardiology Office Note  Date:  06/07/2017   ID:  Alex Jackson, DOB 1945-12-03, MRN [id number]  PCP:  [name], MD  Cardiologist:  Dr. Delton See    Chief complain: SOB   History of Present Illness: Alex Jackson is a 72 y.o. male who presents for post hospitalization for NSTEMI-pk troponin 0.17, cath with findings of severe stenosis of the proximal LAD and underwent successful PCI + DES- Promus premier.   Minimal disease in the RCA,  EF of 45-50%.  Marland Kitchen He was placed on DAPT with ASA + Brilinta. He was continued on BB, ACE-I and statin.  D/C'd 03/02/15.    09/16/15 - 6 months follow up, walks at least 10.000 steps a day, no chest pain, SOB, claudications, orthopnea, PND< no syncope. No muscle pain from atorvastatin. Normal labs in  03/2015, done again at the PCP office in March.  Compliant with his meds. Continue to have mild SOB with Brilinta. Complains of chronic back pain.  03/16/2016 - 6 months follow-up, the patient states that he feels great until 2 weeks ago he has been walking 3 miles a day no shortness of breath chest pain palpitations dizziness or claudications. He developed sinusitis requiring steroids gained some weight he is trying to lose now. He also has back problems. He's been compliant to his meds and states that his legs feel stiff every morning. He denies any lower extremity edema orthopnea or proximal nocturnal dyspnea.  06/07/17 - one year follow-up, the patient is feeling great from cardiac standpoint, he had a back injury in June while vacationing in [place], and was unable to exercise and gained weight. He has lost 15 pounds since then and is motivated to lose another 15 pounds. He was unable to tolerate Lipitor and Pravachol because of muscle pain but is tolerating rosuvastatin well. He denies any claudications, no syncope or palpitations. No bleeding.   Past Medical History:    Diagnosis Date  . CAD in native artery 03/01/15   DES to LAD residual non obstructive RCA disease.  Marland Kitchen Hyperlipidemia   . Hypertension    Past Surgical History:  Procedure Laterality Date  . CARDIAC CATHETERIZATION N/A 03/01/2015   Procedure: Left Heart Cath and Coronary Angiography;  Surgeon: Kathleene Hazel, MD;  Location: [facility] INVASIVE CV LAB;  Service: Cardiovascular;  Laterality: N/A;  . NO PAST SURGERIES     Current Outpatient Medications  Medication Sig Dispense Refill  . aspirin EC 81 MG tablet Take 81 mg by mouth daily.    . bisoprolol (ZEBETA) 10 MG tablet Take 10 mg by mouth daily.    . clopidogrel (PLAVIX) 75 MG tablet TAKE 1 TABLET BY MOUTH EVERY DAY 90 tablet 0  . fluticasone (FLONASE) 50 MCG/ACT nasal spray USE 1 SPRAY IN EACH NOSTRIL EVERY DAY  4  . furosemide (LASIX) 20 MG tablet Take 20 mg by mouth daily as needed. swelling    . lisinopril-hydrochlorothiazide (PRINZIDE,ZESTORETIC) 20-12.5 MG tablet Take 1 tablet by mouth daily.    . metFORMIN (GLUCOPHAGE) 500 MG tablet Take 500 mg by mouth 2 (two) times daily.  5  . montelukast (SINGULAIR) 10 MG tablet Take 10 mg by mouth daily.    . Multiple Vitamin (MULTIVITAMIN WITH MINERALS) TABS tablet Take 1 tablet by mouth daily.    Marland Kitchen omeprazole (PRILOSEC) 20 MG capsule Take 20 mg by mouth daily.    Marland Kitchen  rosuvastatin (CRESTOR) 5 MG tablet Take 1 tablet (5 mg total) by mouth daily. Please keep upcoming appointment for further refills 30 tablet 0   No current facility-administered medications for this visit.     Allergies:   Atorvastatin and Pravastatin   Social History:  The patient  reports that he quit smoking about 33 years ago. He smoked 1.00 pack per day. he has never used smokeless tobacco. He reports that he drinks alcohol. He reports that he does not use drugs.   Family History:  The patient's family history includes Arrhythmia in his sister; Liver cancer in his father; Thyroid cancer in his mother.   ROS:   General:no colds or fevers, no weight changes Skin:no rashes or ulcers HEENT:no blurred vision, no congestion CV:see HPI PUL:see HPI GI:no diarrhea constipation or melena, no indigestion GU:no hematuria, no dysuria MS:no joint pain, no claudication Neuro:no syncope, no lightheadedness Endo:no diabetes, no thyroid disease  Wt Readings from Last 3 Encounters:  06/07/17 270 lb 3.2 oz (122.6 kg)  03/16/16 264 lb (119.7 kg)  09/16/15 248 lb (112.5 kg)    PHYSICAL EXAM: VS:  BP 132/78   Pulse 60   Ht 5' 11.5" (1.816 m)   Wt 270 lb 3.2 oz (122.6 kg)   BMI 37.16 kg/m  , BMI Body mass index is 37.16 kg/m. General:Pleasant affect, NAD Skin:Warm and dry, brisk capillary refill HEENT:normocephalic, sclera clear, mucus membranes moist Neck:supple, no JVD, no bruits  Heart:S1S2 RRR without murmur, gallup, rub or click Lungs:clear without rales, rhonchi, or wheezes WUJ:WJXBAbd:soft, non tender, + BS, do not palpate liver spleen or masses Ext:no lower ext edema, 2+ pedal pulses, 2+ radial pulses Neuro:alert and oriented, MAE, follows commands, + facial symmetry  EKG:  EKG not done today.  Recent Labs: 01/01/2017: Hemoglobin WILL FOLLOW; Platelets WILL FOLLOW    Lipid Panel    Component Value Date/Time   CHOL 137 01/01/2017 0829   TRIG 130 01/01/2017 0829   HDL 35 (L) 01/01/2017 0829   CHOLHDL 3.9 01/01/2017 0829   CHOLHDL 4.4 03/16/2016 0849   VLDL 45 (H) 03/16/2016 0849   LDLCALC 76 01/01/2017 0829   Other studies Reviewed: Additional studies/ records that were reviewed today include: labs and below along with hospital notes.  ECHO: 05/20/2015 Study Conclusions - Left ventricle: The cavity size was normal. There was moderate concentric hypertrophy. Systolic function was mildly reduced. The estimated ejection fraction was in the range of 45% to 50%. Diffuse hypokinesis. Regional wall motion abnormalities cannot be excluded. - Mitral valve: There was mild regurgitation. -  Left atrium: The atrium was moderately dilated.  Cardiac cath: Dominance: Right   Left Anterior Descending  . Vessel is large.   . Prox LAD lesion, 99% stenosed. discrete.   Marland Kitchen. PCI: The pre-interventional distal flow is normal (TIMI 3). Pre-stent angioplasty was performed. A drug-eluting stent was placed. The strut is apposed. Post-stent angioplasty was performed. The post-interventional distal flow is normal (TIMI 3). The intervention was successful. No complications occurred at this lesion.  . There is no residual stenosis post intervention.     . First Diagonal Branch   The vessel is moderate in size.   Marland Kitchen. Second Diagonal Branch   The vessel is small in size.     Left Circumflex  . Vessel is moderate in size. Vessel is angiographically normal.   . First Obtuse Marginal Branch   The vessel is small in size.   Marland Kitchen. Second Obtuse Marginal Branch   The  vessel is small in size.     Right Coronary Artery   . Mid RCA to Dist RCA lesion, 30% stenosed. diffuse.   . Right Posterior Descending Artery   . RPDA lesion, 20% stenosed. diffuse.       TTE: 03/02/15  Left ventricle: The cavity size was normal. There was moderate concentric hypertrophy. Systolic function was mildly reduced. The estimated ejection fraction was in the range of 45% to 50%. Diffuse hypokinesis. Regional wall motion abnormalities cannot be excluded. - Mitral valve: There was mild regurgitation. - Left atrium: The atrium was moderately dilated.  TTE: 02/2016 Left ventricle: The cavity size was normal. There was mild   concentric hypertrophy. Systolic function was normal. The   estimated ejection fraction was in the range of 55% to 60%. Wall   motion was normal; there were no regional wall motion   abnormalities. Doppler parameters are consistent with abnormal   left ventricular relaxation (grade 1 diastolic dysfunction).   Acoustic contrast opacification revealed no evidence ofthrombus. - Left atrium:  The atrium was mildly to moderately dilated.  EKG performed today 06/07/17 - NSR, normal ECG.   ASSESSMENT AND PLAN:  1.  CAD with a h/o NSTEMI with LAD stenosis having promus premier to LAD, mild residual RCA disease on a cath in 02/2015. The patient is completely asymptomatic. Continue BB, ACEI, Plavix and asa.  No side effects.  2. H/o LV dysfunction with EF 45-50%, repeat in Jan 2017 normal LVEF 55-60%. Repeat in 10/17 remains normal.  3. Hyperlipidemia, didn't tolerate lipitor, pravachol, now tolerating rosuvastatin 5 mg po daily. All lipids at goal in 12/2016 - LDL 73, TG 130, HDL 35, advised to increase exercise level, he is motivated to loose weight as well.  4. Essential HTN - controlled.  Follow up in 1 year.  Signed, Tobias Alexander, MD  06/07/2017 8:14 AM    [facility] [address] [place], [place], [place]  [address]/ [address] [place], [place] Phone: [phone]; Fax: [phone]  [phone]

## 2017-06-24 DIAGNOSIS — H524 Presbyopia: Secondary | ICD-10-CM | POA: Diagnosis not present

## 2017-07-07 DIAGNOSIS — R972 Elevated prostate specific antigen [PSA]: Secondary | ICD-10-CM | POA: Diagnosis not present

## 2017-07-23 DIAGNOSIS — G5702 Lesion of sciatic nerve, left lower limb: Secondary | ICD-10-CM | POA: Insufficient documentation

## 2017-07-23 DIAGNOSIS — G8929 Other chronic pain: Secondary | ICD-10-CM | POA: Diagnosis not present

## 2017-07-23 DIAGNOSIS — M5441 Lumbago with sciatica, right side: Secondary | ICD-10-CM | POA: Diagnosis not present

## 2017-07-23 DIAGNOSIS — M5442 Lumbago with sciatica, left side: Secondary | ICD-10-CM | POA: Diagnosis not present

## 2017-07-27 DIAGNOSIS — S0081XA Abrasion of other part of head, initial encounter: Secondary | ICD-10-CM | POA: Diagnosis not present

## 2017-07-27 DIAGNOSIS — M79661 Pain in right lower leg: Secondary | ICD-10-CM | POA: Diagnosis not present

## 2017-07-27 DIAGNOSIS — R0781 Pleurodynia: Secondary | ICD-10-CM | POA: Diagnosis not present

## 2017-07-27 DIAGNOSIS — W19XXXA Unspecified fall, initial encounter: Secondary | ICD-10-CM | POA: Diagnosis not present

## 2017-08-02 ENCOUNTER — Other Ambulatory Visit: Payer: Self-pay | Admitting: Urology

## 2017-08-02 DIAGNOSIS — R972 Elevated prostate specific antigen [PSA]: Secondary | ICD-10-CM | POA: Diagnosis not present

## 2017-08-31 ENCOUNTER — Ambulatory Visit
Admission: RE | Admit: 2017-08-31 | Discharge: 2017-08-31 | Disposition: A | Discharge status: Home / Self Care | Payer: PPO | Source: Ambulatory Visit | Attending: Urology | Admitting: Urology

## 2017-08-31 DIAGNOSIS — R972 Elevated prostate specific antigen [PSA]: Secondary | ICD-10-CM | POA: Diagnosis not present

## 2017-08-31 MED ORDER — GADOBENATE DIMEGLUMINE 529 MG/ML IV SOLN
20.0000 mL | Freq: Once | INTRAVENOUS | Status: AC | PRN
  Administered 2017-08-31: 20 mL via INTRAVENOUS

## 2017-09-08 DIAGNOSIS — E785 Hyperlipidemia, unspecified: Secondary | ICD-10-CM | POA: Diagnosis not present

## 2017-09-08 DIAGNOSIS — E119 Type 2 diabetes mellitus without complications: Secondary | ICD-10-CM | POA: Diagnosis not present

## 2017-09-09 DIAGNOSIS — E119 Type 2 diabetes mellitus without complications: Secondary | ICD-10-CM | POA: Diagnosis not present

## 2017-09-09 DIAGNOSIS — L989 Disorder of the skin and subcutaneous tissue, unspecified: Secondary | ICD-10-CM | POA: Diagnosis not present

## 2017-09-09 DIAGNOSIS — R972 Elevated prostate specific antigen [PSA]: Secondary | ICD-10-CM | POA: Diagnosis not present

## 2017-09-09 DIAGNOSIS — I10 Essential (primary) hypertension: Secondary | ICD-10-CM | POA: Diagnosis not present

## 2017-09-09 DIAGNOSIS — R609 Edema, unspecified: Secondary | ICD-10-CM | POA: Diagnosis not present

## 2017-09-09 DIAGNOSIS — E785 Hyperlipidemia, unspecified: Secondary | ICD-10-CM | POA: Diagnosis not present

## 2017-09-26 ENCOUNTER — Inpatient Hospital Stay (HOSPITAL_COMMUNITY)
Admission: EM | Admit: 2017-09-26 | Discharge: 2017-09-29 | DRG: 247 | Disposition: A | Discharge status: Home / Self Care | Payer: PPO | Attending: Cardiology | Admitting: Cardiology

## 2017-09-26 ENCOUNTER — Emergency Department (HOSPITAL_COMMUNITY): Admit: 2017-09-26 | Discharge: 2017-09-26 | Disposition: A | Discharge status: Home / Self Care | Payer: PPO

## 2017-09-26 ENCOUNTER — Emergency Department (HOSPITAL_COMMUNITY): Payer: PPO

## 2017-09-26 ENCOUNTER — Encounter (HOSPITAL_COMMUNITY): Payer: Self-pay

## 2017-09-26 DIAGNOSIS — I5022 Chronic systolic (congestive) heart failure: Secondary | ICD-10-CM

## 2017-09-26 DIAGNOSIS — I213 ST elevation (STEMI) myocardial infarction of unspecified site: Secondary | ICD-10-CM | POA: Diagnosis not present

## 2017-09-26 DIAGNOSIS — I252 Old myocardial infarction: Secondary | ICD-10-CM

## 2017-09-26 DIAGNOSIS — I25110 Atherosclerotic heart disease of native coronary artery with unstable angina pectoris: Secondary | ICD-10-CM | POA: Diagnosis not present

## 2017-09-26 DIAGNOSIS — E785 Hyperlipidemia, unspecified: Secondary | ICD-10-CM | POA: Diagnosis present

## 2017-09-26 DIAGNOSIS — I214 Non-ST elevation (NSTEMI) myocardial infarction: Secondary | ICD-10-CM

## 2017-09-26 DIAGNOSIS — R079 Chest pain, unspecified: Secondary | ICD-10-CM | POA: Diagnosis not present

## 2017-09-26 DIAGNOSIS — Z955 Presence of coronary angioplasty implant and graft: Secondary | ICD-10-CM | POA: Diagnosis not present

## 2017-09-26 DIAGNOSIS — Z7951 Long term (current) use of inhaled steroids: Secondary | ICD-10-CM

## 2017-09-26 DIAGNOSIS — Z7984 Long term (current) use of oral hypoglycemic drugs: Secondary | ICD-10-CM

## 2017-09-26 DIAGNOSIS — I2510 Atherosclerotic heart disease of native coronary artery without angina pectoris: Secondary | ICD-10-CM | POA: Diagnosis not present

## 2017-09-26 DIAGNOSIS — Z7902 Long term (current) use of antithrombotics/antiplatelets: Secondary | ICD-10-CM

## 2017-09-26 DIAGNOSIS — I255 Ischemic cardiomyopathy: Secondary | ICD-10-CM | POA: Diagnosis not present

## 2017-09-26 DIAGNOSIS — I5030 Unspecified diastolic (congestive) heart failure: Secondary | ICD-10-CM | POA: Diagnosis not present

## 2017-09-26 DIAGNOSIS — Z888 Allergy status to other drugs, medicaments and biological substances status: Secondary | ICD-10-CM | POA: Diagnosis not present

## 2017-09-26 DIAGNOSIS — I10 Essential (primary) hypertension: Secondary | ICD-10-CM | POA: Diagnosis not present

## 2017-09-26 DIAGNOSIS — E782 Mixed hyperlipidemia: Secondary | ICD-10-CM | POA: Diagnosis present

## 2017-09-26 DIAGNOSIS — E669 Obesity, unspecified: Secondary | ICD-10-CM | POA: Diagnosis not present

## 2017-09-26 DIAGNOSIS — Z6838 Body mass index (BMI) 38.0-38.9, adult: Secondary | ICD-10-CM

## 2017-09-26 DIAGNOSIS — Z87891 Personal history of nicotine dependence: Secondary | ICD-10-CM | POA: Diagnosis not present

## 2017-09-26 DIAGNOSIS — R0789 Other chest pain: Secondary | ICD-10-CM | POA: Diagnosis not present

## 2017-09-26 DIAGNOSIS — I110 Hypertensive heart disease with heart failure: Secondary | ICD-10-CM | POA: Diagnosis present

## 2017-09-26 DIAGNOSIS — Z7982 Long term (current) use of aspirin: Secondary | ICD-10-CM

## 2017-09-26 DIAGNOSIS — M7989 Other specified soft tissue disorders: Secondary | ICD-10-CM

## 2017-09-26 DIAGNOSIS — E119 Type 2 diabetes mellitus without complications: Secondary | ICD-10-CM | POA: Diagnosis present

## 2017-09-26 LAB — CBC
HEMATOCRIT: 37.7 % — AB (ref 39.0–52.0)
Hemoglobin: 11.9 g/dL — ABNORMAL LOW (ref 13.0–17.0)
MCH: 27.6 pg (ref 26.0–34.0)
MCHC: 31.6 g/dL (ref 30.0–36.0)
MCV: 87.5 fL (ref 78.0–100.0)
Platelets: 270 10*3/uL (ref 150–400)
RBC: 4.31 MIL/uL (ref 4.22–5.81)
RDW: 14.1 % (ref 11.5–15.5)
WBC: 9.8 10*3/uL (ref 4.0–10.5)

## 2017-09-26 LAB — HEMOGLOBIN A1C
HEMOGLOBIN A1C: 6.1 % — AB (ref 4.8–5.6)
MEAN PLASMA GLUCOSE: 128.37 mg/dL

## 2017-09-26 LAB — I-STAT TROPONIN, ED: Troponin i, poc: 0.36 ng/mL (ref 0.00–0.08)

## 2017-09-26 LAB — BASIC METABOLIC PANEL
Anion gap: 11 (ref 5–15)
BUN: 19 mg/dL (ref 6–20)
CO2: 25 mmol/L (ref 22–32)
Calcium: 9.1 mg/dL (ref 8.9–10.3)
Chloride: 101 mmol/L (ref 101–111)
Creatinine, Ser: 0.83 mg/dL (ref 0.61–1.24)
GFR calc Af Amer: >60 mL/min (ref >60)
GLUCOSE: 233 mg/dL — AB (ref 65–99)
POTASSIUM: 3.9 mmol/L (ref 3.5–5.1)
Sodium: 137 mmol/L (ref 135–145)

## 2017-09-26 LAB — TROPONIN I: TROPONIN I: 0.4 ng/mL — AB (ref <0.03)

## 2017-09-26 LAB — BRAIN NATRIURETIC PEPTIDE: B Natriuretic Peptide: 73.1 pg/mL (ref 0.0–100.0)

## 2017-09-26 MED ORDER — HEPARIN BOLUS VIA INFUSION
4000.0000 [IU] | Freq: Once | INTRAVENOUS | Status: AC
  Administered 2017-09-26: 4000 [IU] via INTRAVENOUS
  Filled 2017-09-26: qty 4000

## 2017-09-26 MED ORDER — HEPARIN (PORCINE) IN NACL 100-0.45 UNIT/ML-% IJ SOLN
2050.0000 [IU]/h | INTRAMUSCULAR | Status: DC
  Administered 2017-09-26: 1250 [IU]/h via INTRAVENOUS
  Administered 2017-09-27: 1650 [IU]/h via INTRAVENOUS
  Administered 2017-09-27 – 2017-09-28 (×2): 2050 [IU]/h via INTRAVENOUS
  Filled 2017-09-26 (×4): qty 250

## 2017-09-26 MED ORDER — ACETAMINOPHEN 325 MG PO TABS
650.0000 mg | ORAL_TABLET | ORAL | Status: DC | PRN
  Administered 2017-09-26 – 2017-09-28 (×2): 650 mg via ORAL
  Filled 2017-09-26: qty 2

## 2017-09-26 MED ORDER — ASPIRIN EC 81 MG PO TBEC
81.0000 mg | DELAYED_RELEASE_TABLET | Freq: Every day | ORAL | Status: DC
  Administered 2017-09-27 – 2017-09-29 (×3): 81 mg via ORAL
  Filled 2017-09-26 (×3): qty 1

## 2017-09-26 MED ORDER — FLUTICASONE PROPIONATE 50 MCG/ACT NA SUSP
1.0000 | Freq: Every day | NASAL | Status: DC
  Administered 2017-09-26 – 2017-09-27 (×2): 1 via NASAL
  Filled 2017-09-26: qty 16

## 2017-09-26 MED ORDER — LISINOPRIL 20 MG PO TABS
20.0000 mg | ORAL_TABLET | Freq: Every day | ORAL | Status: DC
  Administered 2017-09-27 – 2017-09-29 (×3): 20 mg via ORAL
  Filled 2017-09-26: qty 1
  Filled 2017-09-26 (×2): qty 2

## 2017-09-26 MED ORDER — MONTELUKAST SODIUM 10 MG PO TABS
10.0000 mg | ORAL_TABLET | Freq: Every day | ORAL | Status: DC
  Administered 2017-09-26 – 2017-09-28 (×3): 10 mg via ORAL
  Filled 2017-09-26 (×3): qty 1

## 2017-09-26 MED ORDER — NITROGLYCERIN 0.4 MG SL SUBL: 0.4000 mg | SUBLINGUAL_TABLET | SUBLINGUAL | Status: DC | PRN

## 2017-09-26 MED ORDER — ROSUVASTATIN CALCIUM 10 MG PO TABS
5.0000 mg | ORAL_TABLET | Freq: Every day | ORAL | Status: DC
  Administered 2017-09-27 – 2017-09-29 (×3): 5 mg via ORAL
  Filled 2017-09-26 (×3): qty 1

## 2017-09-26 MED ORDER — CLOPIDOGREL BISULFATE 75 MG PO TABS
75.0000 mg | ORAL_TABLET | Freq: Every day | ORAL | Status: DC
  Administered 2017-09-27 – 2017-09-29 (×3): 75 mg via ORAL
  Filled 2017-09-26 (×3): qty 1

## 2017-09-26 MED ORDER — BISOPROLOL FUMARATE 5 MG PO TABS
10.0000 mg | ORAL_TABLET | Freq: Every day | ORAL | Status: DC
  Administered 2017-09-27 – 2017-09-29 (×3): 10 mg via ORAL
  Filled 2017-09-26 (×3): qty 2

## 2017-09-26 MED ORDER — ASPIRIN 81 MG PO CHEW
324.0000 mg | CHEWABLE_TABLET | Freq: Once | ORAL | Status: AC
  Administered 2017-09-26: 324 mg via ORAL
  Filled 2017-09-26: qty 4

## 2017-09-26 MED ORDER — ONDANSETRON HCL 4 MG/2ML IJ SOLN: 4.0000 mg | Freq: Four times a day (QID) | INTRAMUSCULAR | Status: DC | PRN

## 2017-09-26 MED ORDER — PANTOPRAZOLE SODIUM 40 MG PO TBEC
40.0000 mg | DELAYED_RELEASE_TABLET | Freq: Every day | ORAL | Status: DC
  Administered 2017-09-27 – 2017-09-29 (×3): 40 mg via ORAL
  Filled 2017-09-26 (×3): qty 1

## 2017-09-26 NOTE — H&P (Signed)
Cardiology Admission History and Physical:   Patient ID: Alex Jackson; MRN: [id number]; DOB: 14-Jun-1945   Admission date: 09/26/2017  Primary Care Provider: [name], MD Primary Cardiologist: Delton See  Chief Complaint:  Chest pain  Patient Profile:   Alex Jackson is a 72 y.o. male with a history of CAD c/b prior NSTEMI s/p PCI to LAD, HFrEF (EF 34-50% with subsequent normalization), HTN, HLD, who presents with chest pain.   History of Present Illness:   DOMINQUE Jackson is a 72 y.o. male with a history of CAD c/b prior NSTEMI s/p PCI to LAD, HFrEF (EF 34-50% with subsequent normalization), HTN, HLD, who presents with chest pain.   The patient has follows with Dr. Delton See for his CAD and was last seen in clinic in February, at which time he was doing well and was free of any cardiac symtpoms.   He presents to the ED today with chest pain. He reports that over the past couple weeks he has had intermittent burning in his chest, which sometimes occurs with exertion. He has also had some sweats and cough that he attributes to seasonal allergies. He denies any dyspnea and states that he has stable R>L LE edema. He was traveling [place] from [place] via airplane today, when he had worsening of his chest burning when walking off the plane, so he came to the ED.  In the ED, pt with SBP 130-140s. ECG showed NSR with poor R wave progression. Troponin was elevated at 0.36. He was started on a heparin gtt. He is currently chest pain free.    Past Medical History:  Diagnosis Date  . CAD in native artery 03/01/15   DES to LAD residual non obstructive RCA disease.  Marland Kitchen Hyperlipidemia   . Hypertension     Past Surgical History:  Procedure Laterality Date  . CARDIAC CATHETERIZATION N/A 03/01/2015   Procedure: Left Heart Cath and Coronary Angiography;  Surgeon: Kathleene Hazel, MD;  Location: [facility] INVASIVE CV LAB;  Service: Cardiovascular;  Laterality: N/A;  . NO PAST SURGERIES       Medications Prior to Admission: Prior to Admission medications   Medication Sig Start Date End Date Taking? Authorizing Provider  aspirin EC 81 MG tablet Take 81 mg by mouth daily.    [provider]  bisoprolol (ZEBETA) 10 MG tablet Take 1 tablet (10 mg total) by mouth daily. 06/07/17   Lars Masson, MD  clopidogrel (PLAVIX) 75 MG tablet Take 1 tablet (75 mg total) by mouth daily. 06/07/17   Lars Masson, MD  fluticasone (FLONASE) 50 MCG/ACT nasal spray USE 1 SPRAY IN EACH NOSTRIL EVERY DAY 01/11/15   [provider]  furosemide (LASIX) 20 MG tablet Take 1 tablet (20 mg total) by mouth daily as needed. swelling 06/07/17   Lars Masson, MD  lisinopril-hydrochlorothiazide (PRINZIDE,ZESTORETIC) 20-12.5 MG tablet Take 1 tablet by mouth daily. 06/07/17   Lars Masson, MD  metFORMIN (GLUCOPHAGE) 500 MG tablet Take 500 mg by mouth 2 (two) times daily. 04/20/17   [provider]  montelukast (SINGULAIR) 10 MG tablet Take 10 mg by mouth daily. 02/04/17   [provider]  Multiple Vitamin (MULTIVITAMIN WITH MINERALS) TABS tablet Take 1 tablet by mouth daily.    [provider]  omeprazole (PRILOSEC) 20 MG capsule Take 20 mg by mouth daily.    [provider]  rosuvastatin (CRESTOR) 5 MG tablet TAKE 1 TABLET BY MOUTH EVERY DAY 06/07/17  Lars Masson, MD     Allergies:    Allergies  Allergen Reactions  . Atorvastatin Other (See Comments)    Pt reports "causes lower extremity muscle aches."  . Prednisone Other (See Comments)    Makes him crazy  . Pravastatin Other (See Comments)    Pt reports "causes bilateral calf cramping."    Social History:   Social History   Socioeconomic History  . Marital status: Divorced    Spouse name: Not on file  . Number of children: Not on file  . Years of education: Not on file  . Highest education level: Not on file  Occupational History  . Not on file  Social Needs  . Financial  resource strain: Not on file  . Food insecurity:    Worry: Not on file    Inability: Not on file  . Transportation needs:    Medical: Not on file    Non-medical: Not on file  Tobacco Use  . Smoking status: Former Smoker    Packs/day: 1.00    Last attempt to quit: 07/03/1983    Years since quitting: 34.2  . Smokeless tobacco: Never Used  . Tobacco comment: quit in Mar 1985  Substance and Sexual Activity  . Alcohol use: Yes    Alcohol/week: 0.0 oz    Comment: "one or two drinks a night"  . Drug use: No  . Sexual activity: Not on file  Lifestyle  . Physical activity:    Days per week: Not on file    Minutes per session: Not on file  . Stress: Not on file  Relationships  . Social connections:    Talks on phone: Not on file    Gets together: Not on file    Attends religious service: Not on file    Active member of club or organization: Not on file    Attends meetings of clubs or organizations: Not on file    Relationship status: Not on file  . Intimate partner violence:    Fear of current or ex partner: Not on file    Emotionally abused: Not on file    Physically abused: Not on file    Forced sexual activity: Not on file  Other Topics Concern  . Not on file  Social History Narrative  . Not on file    Family History:    The patient's family history includes Arrhythmia in his sister; Liver cancer in his father; Thyroid cancer in his mother.    ROS:  Please see the history of present illness.  All other ROS reviewed and negative.     Physical Exam/Data:   Vitals:   09/26/17 1630 09/26/17 1645 09/26/17 1652 09/26/17 1715  BP: (!) 145/80 130/76  126/72  Pulse: 83 80  75  Resp: Temp:      TempSrc:      SpO2: 98% 98%  97%  Weight:   123 kg (271 lb 2.7 oz)    No intake or output data in the 24 hours ending 09/26/17 1802 Filed Weights   09/26/17 1652  Weight: 123 kg (271 lb 2.7 oz)   Body mass index is 37.29 kg/m.  General:  Well nourished, well  developed, in no acute distress, obese HEENT: normal Lymph: no adenopathy Neck: no JVD Cardiac:  normal S1, S2; RRR; no murmur  Lungs:  clear to auscultation bilaterally, no wheezing, rhonchi or rales  Abd: soft, nontender, no hepatomegaly  Ext: 1+ pitting  LE edema (slightly greater in R leg)  Musculoskeletal:  No deformities, BUE and BLE strength normal and equal Skin: warm and dry  Neuro:  No focal abnormalities noted Psych:  Normal affect    EKG:  The ECG that was done and was personally reviewed and demonstrates NSR with poor R wave progression.   Relevant CV Studies:  LHC: 02/2015  Mid RCA to Dist RCA lesion, 30% stenosed.  RPDA lesion, 20% stenosed.  Prox LAD lesion, 99% stenosed. Post intervention, there is a 0% residual stenosis. DES placed proximal LAD.  There is mild left ventricular systolic dysfunction. 1. NSTEMI secondary to severe stenosis proximal LAD, now s/p successful PTCA/DES x 1 proximal LAD.  2. Minimal disease RCA 3. Mild segmental LV systolic dysfunction   TTE: 02/2016 Left ventricle: The cavity size was normal. There was mild concentric hypertrophy. Systolic function was normal. The estimated ejection fraction was in the range of 55% to 60%. Wall motion was normal; there were no regional wall motion abnormalities. Doppler parameters are consistent with abnormal left ventricular relaxation (grade 1 diastolic dysfunction). Acoustic contrast opacification revealed no evidence ofthrombus. - Left atrium: The atrium was mildly to moderately dilated.   Laboratory Data:  Chemistry Recent Labs  Lab 09/26/17 1501  NA 137  K 3.9  CL 101  CO2 25  GLUCOSE 233*  BUN 19  CREATININE 0.83  CALCIUM 9.1  GFRNONAA >60  GFRAA >60  ANIONGAP 11    No results for input(s): PROT, ALBUMIN, AST, ALT, ALKPHOS, BILITOT in the last 168 hours. Hematology Recent Labs  Lab 09/26/17 1501  WBC 9.8  RBC 4.31  HGB 11.9*  HCT 37.7*  MCV 87.5  MCH  27.6  MCHC 31.6  RDW 14.1  PLT 270   Cardiac EnzymesNo results for input(s): TROPONINI in the last 168 hours.  Recent Labs  Lab 09/26/17 1527  TROPIPOC 0.36*    BNPNo results for input(s): BNP, PROBNP in the last 168 hours.  DDimer No results for input(s): DDIMER in the last 168 hours.  Radiology/Studies:  Dg Chest 2 View  Result Date: 09/26/2017 CLINICAL DATA:  Upper mid chest pain radiating into left shoulder beginning as he got off an airplane today. Hx of night sweats x 2 night. Congestion for 1-2 months. Hx of seasonal allergies.Hx of HTN- on meds. EXAM: CHEST - 2 VIEW COMPARISON:  03/01/2015 FINDINGS: Neck silhouette is normal in size and configuration. No mediastinal or hilar masses. No evidence of adenopathy. Clear lungs.  No pleural effusion or pneumothorax. Skeletal structures are intact. IMPRESSION: No active cardiopulmonary disease. Electronically Signed   By: Amie Portland M.D.   On: 09/26/2017 15:47    Assessment and Plan:   NSTEMI CAD with prior PCI The patient has known CAD and presents to the ED with progressive chest burning with exertion. ECG is largely stable, but troponin is elevated on initial check. His chest pain is different than that which preceded his prior MI, but his presentation is consistent with NSTEMI. He is currently chest pain free. He will require repeat LHC. -Monitor on telemetry -Continue to trend troponin -Continue heparin gtt -Continue home DAPT -Continue home rosuvastatin -Continue home bisoprolol -HgA1c, lipid panel ordered -NPO at midnight for possible LHC tomorrow  H/o LV dysfunction with EF 45-50% with subsequent normalization LE edema The patient's last echocardiogram showed normalization of his LVEF. He has not needed his furosemide. He does have some mild asymmetric LE edema, which he states is stable. LE [place] was  ordered to rule out DVT (given plane flight). He otherwise appears largely euvolemic on exam. -LE [place] ordered in ED,  pending -Repeat TTE ordered -Adding BNP onto ED labs -Holding diuretics in anticipation of LHC -Continue home bisoprolol, lisinopril   Hyperlipidemia Previously has not tolerated lipitor, pravachol, now tolerating rosuvastatin 5 mg po daily. His last lipid panel was at goal in 12/2016 - LDL 73, TG 130, HDL 35 -Continue rosuvastatin -Repeat lipid panel ordered  Essential HTN  -Continue above medications   Severity of Illness: The appropriate patient status for this patient is INPATIENT. Inpatient status is judged to be reasonable and necessary in order to provide the required intensity of service to ensure the patient's safety. The patient's presenting symptoms, physical exam findings, and initial radiographic and laboratory data in the context of their chronic comorbidities is felt to place them at high risk for further clinical deterioration. Furthermore, it is not anticipated that the patient will be medically stable for discharge from the hospital within 2 midnights of admission. The following factors support the patient status of inpatient.   " The patient's presenting symptoms include chest pain. " The worrisome physical exam findings include LE edema. " The initial radiographic and laboratory data are worrisome because of elevated troponin. " The chronic co-morbidities include CAD.   * I certify that at the point of admission it is my clinical judgment that the patient will require inpatient hospital care spanning beyond 2 midnights from the point of admission due to high intensity of service, high risk for further deterioration and high frequency of surveillance required.*    For questions or updates, please contact [facility] Please consult [email or web address] for contact info under Cardiology/STEMI.    Signed, Ernest Mallick, MD  09/26/2017 6:02 PM

## 2017-09-26 NOTE — ED Notes (Signed)
Notified Jamie(RN) of pt's trop .36. Pt to get next room.

## 2017-09-26 NOTE — Progress Notes (Signed)
ANTICOAGULATION CONSULT NOTE  Pharmacy Consult for heparin Indication: chest pain/ACS  Allergies  Allergen Reactions  . Atorvastatin Other (See Comments)    Pt reports "causes lower extremity muscle aches."  . Pravastatin Other (See Comments)    Pt reports "causes bilateral calf cramping."    Patient Measurements: Heparin Dosing Weight: 102.7 kg  Vital Signs: Temp: 98.9 F (37.2 C) (05/26 1455) Temp Source: Oral (05/26 1455) BP: 130/76 (05/26 1645) Pulse Rate: 80 (05/26 1645)  Labs: Recent Labs    09/26/17 1501  HGB 11.9*  HCT 37.7*  PLT 270  CREATININE 0.83    Medical History: Past Medical History:  Diagnosis Date  . CAD in native artery 03/01/15   DES to LAD residual non obstructive RCA disease.  Marland Kitchen Hyperlipidemia   . Hypertension     Assessment: Starting heparin for rule out ACS Trop + SCr and cbc wnl  Goal of Therapy:  Heparin level 0.3-0.7 units/ml Monitor platelets by anticoagulation protocol: Yes    Plan:  -heparin bolus 4000 units x1 then 1250 units/hr -daily HL, CBC -first level with AM labs    Baldemar Friday 09/26/2017,4:54 PM

## 2017-09-26 NOTE — Progress Notes (Addendum)
VASCULAR LAB PRELIMINARY  PRELIMINARY  PRELIMINARY  PRELIMINARY  Bilateral lower extremity venous duplex completed.    Preliminary report:  There is no DVT or SVT noted in the bilateral lower extremities.  Gave report to Renne Crigler, PA-C  Livana Yerian, RVT 09/26/2017, 5:49 PM

## 2017-09-26 NOTE — ED Triage Notes (Signed)
Pt presents for evaluation of L sided CP and radiation to L arm x 2-3 days. Pt reports edema to bilateral legs as well.

## 2017-09-26 NOTE — ED Notes (Signed)
Pt able to ambulate to restroom from room, tolerated well. Urine sample was obtained and is at bedside.

## 2017-09-26 NOTE — ED Provider Notes (Signed)
[facility] EMERGENCY DEPARTMENT Provider Note   CSN: [id number] Arrival date & time: 09/26/17  1447     History   Chief Complaint Chief Complaint  Patient presents with  . Chest Pain    HPI Alex Jackson is a 72 y.o. male.  Patient with h/o ACS s/p stent 02/2015, followed by Dr. Delton See of cardiology -- presents to the ED with c/o CP.  Patient arrived to the airport from [place] just prior to arrival.  He states that while he was walking across the airport he developed a pressure sensation in his upper chest, throat with radiation to his left shoulder.  He felt uncertain about the pain and asked his friend to drive him to the hospital.  Symptoms resolved with rest and patient currently is asymptomatic.  He does report having several intermittent episodes that occur with activity over the past 2 weeks.  He gives an example of walking up several flights of stairs at his job.  He states that when he rests and settles down, the pain improves.  Patient reports waking from sleep the past 2 nights with diaphoresis and slight upper chest discomfort.  He thought that he had a respiratory infection and a fever was breaking.  He otherwise denies vomiting or diarrhea.  No shortness of breath.  He reports bilateral lower extremity swelling that is been ongoing for some time.  Swelling is worse as a day progresses and better in the morning.  He has not had ultrasounds of his legs.  He does report decreased ability to exercise recently due to residual back pain and leg pain from a fall he had while vacationing in [place] about 7 months ago. The onset of this condition was acute. The course is resolved. Aggravating factors: none. Alleviating factors: none.    Cath report:   Mid RCA to Dist RCA lesion, 30% stenosed.  RPDA lesion, 20% stenosed.  Prox LAD lesion, 99% stenosed. Post intervention, there is a 0% residual stenosis. DES placed proximal LAD.  There is mild left  ventricular systolic dysfunction.   1. NSTEMI secondary to severe stenosis proximal LAD, now s/p successful PTCA/DES x 1 proximal LAD.  2. Minimal disease RCA 3. Mild segmental LV systolic dysfunction     Past Medical History:  Diagnosis Date  . CAD in native artery 03/01/15   DES to LAD residual non obstructive RCA disease.  Marland Kitchen Hyperlipidemia   . Hypertension     Patient Active Problem List   Diagnosis Date Noted  . Cardiomyopathy, ischemic 09/16/2015  . SOB (shortness of breath) 09/16/2015  . LV dysfunction 09/16/2015  . Hyperlipidemia 03/02/2015  . Essential hypertension 03/02/2015  . S/P drug eluting coronary stent placement 03/02/2015  . NSTEMI (non-ST elevated myocardial infarction) (HCC) 03/01/2015  . Coronary artery disease involving native coronary artery of native heart with unstable angina pectoris [facility])     Past Surgical History:  Procedure Laterality Date  . CARDIAC CATHETERIZATION N/A 03/01/2015   Procedure: Left Heart Cath and Coronary Angiography;  Surgeon: Kathleene Hazel, MD;  Location: [facility] INVASIVE CV LAB;  Service: Cardiovascular;  Laterality: N/A;  . NO PAST SURGERIES          Home Medications    Prior to Admission medications   Medication Sig Start Date End Date Taking? Authorizing Provider  aspirin EC 81 MG tablet Take 81 mg by mouth daily.    [provider]  bisoprolol (ZEBETA) 10 MG tablet Take 1 tablet (  10 mg total) by mouth daily. 06/07/17   Lars Masson, MD  clopidogrel (PLAVIX) 75 MG tablet Take 1 tablet (75 mg total) by mouth daily. 06/07/17   Lars Masson, MD  fluticasone (FLONASE) 50 MCG/ACT nasal spray USE 1 SPRAY IN EACH NOSTRIL EVERY DAY 01/11/15   [provider]  furosemide (LASIX) 20 MG tablet Take 1 tablet (20 mg total) by mouth daily as needed. swelling 06/07/17   Lars Masson, MD  lisinopril-hydrochlorothiazide (PRINZIDE,ZESTORETIC) 20-12.5 MG tablet Take 1 tablet by mouth daily. 06/07/17    Lars Masson, MD  metFORMIN (GLUCOPHAGE) 500 MG tablet Take 500 mg by mouth 2 (two) times daily. 04/20/17   [provider]  montelukast (SINGULAIR) 10 MG tablet Take 10 mg by mouth daily. 02/04/17   [provider]  Multiple Vitamin (MULTIVITAMIN WITH MINERALS) TABS tablet Take 1 tablet by mouth daily.    [provider]  omeprazole (PRILOSEC) 20 MG capsule Take 20 mg by mouth daily.    [provider]  rosuvastatin (CRESTOR) 5 MG tablet TAKE 1 TABLET BY MOUTH EVERY DAY 06/07/17   Lars Masson, MD    Family History Family History  Problem Relation Age of Onset  . Thyroid cancer Mother   . Liver cancer Father        pancreatic  . Arrhythmia Sister     Social History Social History   Tobacco Use  . Smoking status: Former Smoker    Packs/day: 1.00    Last attempt to quit: 07/03/1983    Years since quitting: 34.2  . Smokeless tobacco: Never Used  . Tobacco comment: quit in Mar 1985  Substance Use Topics  . Alcohol use: Yes    Alcohol/week: 0.0 oz    Comment: "one or two drinks a night"  . Drug use: No     Allergies   Atorvastatin and Pravastatin   Review of Systems Review of Systems  Constitutional: Negative for diaphoresis and fever.  Eyes: Negative for redness.  Respiratory: Negative for cough and shortness of breath.   Cardiovascular: Positive for chest pain and leg swelling. Negative for palpitations.  Gastrointestinal: Negative for abdominal pain, nausea and vomiting.  Genitourinary: Negative for dysuria.  Musculoskeletal: Negative for back pain and neck pain.  Skin: Negative for rash.  Neurological: Negative for syncope and light-headedness.  Psychiatric/Behavioral: The patient is not nervous/anxious.      Physical Exam Updated Vital Signs BP (!) 146/75 (BP Location: Right Arm)   Pulse 87   Temp 98.9 F (37.2 C) (Oral)   Resp 16   SpO2 100%   Physical Exam  Constitutional: He appears well-developed and  well-nourished.  HENT:  Head: Normocephalic and atraumatic.  Mouth/Throat: Mucous membranes are normal. Mucous membranes are not dry.  Eyes: Conjunctivae are normal. Right eye exhibits no discharge. Left eye exhibits no discharge.  Neck: Trachea normal and normal range of motion. Neck supple. Normal carotid pulses and no JVD present. No muscular tenderness present. Carotid bruit is not present. No tracheal deviation present.  Cardiovascular: Normal rate, regular rhythm, S1 normal, S2 normal, normal heart sounds, intact distal pulses and normal pulses. Exam reveals no distant heart sounds and no decreased pulses.  No murmur heard. Pulmonary/Chest: Effort normal and breath sounds normal. No respiratory distress. He has no wheezes. He exhibits no tenderness.  Abdominal: Soft. Normal aorta and bowel sounds are normal. There is no tenderness. There is no rebound and no guarding.  Musculoskeletal:  Right lower leg: He exhibits edema (1-2+ pitting edema). He exhibits no tenderness.       Left lower leg: He exhibits edema (1-2+ pitting edema). He exhibits no tenderness.  Neurological: He is alert.  Skin: Skin is warm and dry. He is not diaphoretic. No cyanosis. No pallor.  Psychiatric: He has a normal mood and affect.  Nursing note and vitals reviewed.    ED Treatments / Results  Labs (all labs ordered are listed, but only abnormal results are displayed) Labs Reviewed  BASIC METABOLIC PANEL - Abnormal; Notable for the following components:      Result Value   Glucose, Bld 233 (*)    All other components within normal limits  CBC - Abnormal; Notable for the following components:   Hemoglobin 11.9 (*)    HCT 37.7 (*)    All other components within normal limits  I-STAT TROPONIN, ED - Abnormal; Notable for the following components:   Troponin i, poc 0.36 (*)    All other components within normal limits    EKG EKG Interpretation  Date/Time:  Sunday Sep 26 2017 14:52:31  EDT Ventricular Rate:  87 PR Interval:  188 QRS Duration: 96 QT Interval:  352 QTC Calculation: 423 R Axis:   71 Text Interpretation:  Normal sinus rhythm Possible Anterior infarct , age undetermined Abnormal ECG Confirmed by Rolland Porter ([id number]) on 09/26/2017 3:52:50 PM   Radiology Dg Chest 2 View  Result Date: 09/26/2017 CLINICAL DATA:  Upper mid chest pain radiating into left shoulder beginning as he got off an airplane today. Hx of night sweats x 2 night. Congestion for 1-2 months. Hx of seasonal allergies.Hx of HTN- on meds. EXAM: CHEST - 2 VIEW COMPARISON:  03/01/2015 FINDINGS: Neck silhouette is normal in size and configuration. No mediastinal or hilar masses. No evidence of adenopathy. Clear lungs.  No pleural effusion or pneumothorax. Skeletal structures are intact. IMPRESSION: No active cardiopulmonary disease. Electronically Signed   By: Amie Portland M.D.   On: 09/26/2017 15:47    Procedures Procedures (including critical care time)  Medications Ordered in ED Medications  heparin bolus via infusion 4,000 Units (has no administration in time range)  heparin ADULT infusion 100 units/mL (25000 units/240mL sodium chloride 0.45%) (has no administration in time range)  aspirin chewable tablet 324 mg (324 mg Oral Given 09/26/17 1648)     Initial Impression / Assessment and Plan / ED Course  I have reviewed the triage vital signs and the nursing notes.  Pertinent labs & imaging results that were available during my care of the patient were reviewed by me and considered in my medical decision making (see chart for details).     Patient not yet in room. EKG and previous records reviewed.    Vital signs reviewed and are as follows: BP (!) 146/75 (BP Location: Right Arm)   Pulse 87   Temp 98.9 F (37.2 C) (Oral)   Resp 16   SpO2 100%   Patient seen and examined. Work-up initiated. Medications ordered. Will treat as STEMI.  Leg swelling is suggestive of venous insufficiency.   Given constellation of symptoms, will rule out DVT with ultrasound.  4:58 PM Spoke with Dr. Donnie Aho who will see.  5:19 PM Cardiology at bedside. Pt rechecked. Continues to be pain free.   5:45 PM Pt admitted.    CRITICAL CARE Performed by: Carolee Rota Total critical care time: 35 minutes Critical care time was exclusive of separately billable procedures and treating other patients.  Critical care was necessary to treat or prevent imminent or life-threatening deterioration. Critical care was time spent personally by me on the following activities: development of treatment plan with patient and/or surrogate as well as nursing, discussions with consultants, evaluation of patient's response to treatment, examination of patient, obtaining history from patient or surrogate, ordering and performing treatments and interventions, ordering and review of laboratory studies, ordering and review of radiographic studies, pulse oximetry and re-evaluation of patient's condition.   Final Clinical Impressions(s) / ED Diagnoses   Final diagnoses:  NSTEMI (non-ST elevated myocardial infarction) (HCC)   Admit, with with NSTEMI, heparin started. Pain free. Cardiology consulted.   ED Discharge Orders    None       Renne Crigler, Cordelia Poche 09/26/17 1746    Rolland Porter, MD 10/03/17 2051

## 2017-09-26 NOTE — Progress Notes (Signed)
Pt arrived from the ED. Chg bath complete. Vital signs stable. Pt oriented to room and telemetry applied. Plan of care reviewed with the pt. Questions answered. No complaints at this time. Will continue to follow.

## 2017-09-27 ENCOUNTER — Inpatient Hospital Stay (HOSPITAL_COMMUNITY): Payer: PPO

## 2017-09-27 DIAGNOSIS — E782 Mixed hyperlipidemia: Secondary | ICD-10-CM

## 2017-09-27 DIAGNOSIS — I5030 Unspecified diastolic (congestive) heart failure: Secondary | ICD-10-CM

## 2017-09-27 LAB — HEPARIN LEVEL (UNFRACTIONATED)
Heparin Unfractionated: <0.1 IU/mL — ABNORMAL LOW (ref 0.30–0.70)
Heparin Unfractionated: 0.16 IU/mL — ABNORMAL LOW (ref 0.30–0.70)
Heparin Unfractionated: 0.3 IU/mL (ref 0.30–0.70)

## 2017-09-27 LAB — BASIC METABOLIC PANEL
Anion gap: 16 — ABNORMAL HIGH (ref 5–15)
BUN: 21 mg/dL — AB (ref 6–20)
CO2: 21 mmol/L — ABNORMAL LOW (ref 22–32)
CREATININE: 0.78 mg/dL (ref 0.61–1.24)
Calcium: 8.9 mg/dL (ref 8.9–10.3)
Chloride: 104 mmol/L (ref 101–111)
GFR calc Af Amer: >60 mL/min (ref >60)
GFR calc non Af Amer: >60 mL/min (ref >60)
GLUCOSE: 139 mg/dL — AB (ref 65–99)
Potassium: 3.8 mmol/L (ref 3.5–5.1)
Sodium: 141 mmol/L (ref 135–145)

## 2017-09-27 LAB — CBC
HCT: 36.4 % — ABNORMAL LOW (ref 39.0–52.0)
Hemoglobin: 11.5 g/dL — ABNORMAL LOW (ref 13.0–17.0)
MCH: 28.3 pg (ref 26.0–34.0)
MCHC: 31.6 g/dL (ref 30.0–36.0)
MCV: 89.4 fL (ref 78.0–100.0)
PLATELETS: 249 10*3/uL (ref 150–400)
RBC: 4.07 MIL/uL — AB (ref 4.22–5.81)
RDW: 14.3 % (ref 11.5–15.5)
WBC: 9.8 10*3/uL (ref 4.0–10.5)

## 2017-09-27 LAB — ECHOCARDIOGRAM COMPLETE
HEIGHTINCHES: 71.5 in
WEIGHTICAEL: 4475.2 [oz_av]

## 2017-09-27 LAB — LIPID PANEL
Cholesterol: 145 mg/dL (ref 0–200)
HDL: 27 mg/dL — ABNORMAL LOW (ref >40)
LDL CALC: UNDETERMINED mg/dL (ref 0–99)
TRIGLYCERIDES: 542 mg/dL — AB (ref <150)
Total CHOL/HDL Ratio: 5.4 RATIO
VLDL: UNDETERMINED mg/dL (ref 0–40)

## 2017-09-27 LAB — TROPONIN I
TROPONIN I: 0.26 ng/mL — AB (ref <0.03)
TROPONIN I: 0.22 ng/mL — AB (ref <0.03)

## 2017-09-27 MED ORDER — HEPARIN BOLUS VIA INFUSION
3000.0000 [IU] | Freq: Once | INTRAVENOUS | Status: AC
  Administered 2017-09-27: 3000 [IU] via INTRAVENOUS
  Filled 2017-09-27: qty 3000

## 2017-09-27 MED ORDER — SODIUM CHLORIDE 0.9% FLUSH: 3.0000 mL | INTRAVENOUS | Status: DC | PRN

## 2017-09-27 MED ORDER — HEPARIN BOLUS VIA INFUSION
2000.0000 [IU] | Freq: Once | INTRAVENOUS | Status: AC
  Administered 2017-09-27: 2000 [IU] via INTRAVENOUS
  Filled 2017-09-27: qty 2000

## 2017-09-27 MED ORDER — ASPIRIN 81 MG PO CHEW
81.0000 mg | CHEWABLE_TABLET | ORAL | Status: AC
  Administered 2017-09-28: 81 mg via ORAL
  Filled 2017-09-27: qty 1

## 2017-09-27 MED ORDER — SODIUM CHLORIDE 0.9 % WEIGHT BASED INFUSION
1.0000 mL/kg/h | INTRAVENOUS | Status: DC
  Administered 2017-09-28 (×2): 1 mL/kg/h via INTRAVENOUS

## 2017-09-27 MED ORDER — SODIUM CHLORIDE 0.9 % WEIGHT BASED INFUSION
3.0000 mL/kg/h | INTRAVENOUS | Status: DC
  Administered 2017-09-28: 3 mL/kg/h via INTRAVENOUS

## 2017-09-27 MED ORDER — SODIUM CHLORIDE 0.9 % IV SOLN: 250.0000 mL | INTRAVENOUS | Status: DC | PRN

## 2017-09-27 MED ORDER — OMEGA-3-ACID ETHYL ESTERS 1 G PO CAPS
1.0000 g | ORAL_CAPSULE | Freq: Two times a day (BID) | ORAL | Status: DC
  Administered 2017-09-27 – 2017-09-29 (×5): 1 g via ORAL
  Filled 2017-09-27 (×5): qty 1

## 2017-09-27 MED ORDER — SODIUM CHLORIDE 0.9% FLUSH: 3.0000 mL | Freq: Two times a day (BID) | INTRAVENOUS | Status: DC

## 2017-09-27 NOTE — Progress Notes (Addendum)
ANTICOAGULATION CONSULT NOTE Pharmacy Consult for heparin Indication: chest pain/ACS  Allergies  Allergen Reactions  . Atorvastatin Other (See Comments)    Pt reports "causes lower extremity muscle aches."  . Prednisone Other (See Comments)    Makes him crazy  . Pravastatin Other (See Comments)    Pt reports "causes bilateral calf cramping."    Patient Measurements: Heparin Dosing Weight: 102.7 kg  Vital Signs: Temp: 98 F (36.7 C) (05/27 0622) Temp Source: Oral (05/27 0622) BP: 133/79 (05/27 0957) Pulse Rate: 76 (05/27 0622)  Labs: Recent Labs    09/26/17 1501 09/26/17 2100 09/27/17 0222 09/27/17 0730 09/27/17 1155  HGB 11.9*  --  11.5*  --   --   HCT 37.7*  --  36.4*  --   --   PLT 270  --  249  --   --   HEPARINUNFRC  --   --  <0.10*  --  0.16*  CREATININE 0.83  --   --  0.78  --   TROPONINI  --  0.40* 0.26*  --   --    Assessment: 72 y.o. male with chest pain on heparin. Heparin level subtherapeutic at 0.16. RN confirmed no interruptions to therapy and no issues with IV lines. CBC stable, no bleeding reported  Goal of Therapy:  Heparin level 0.3-0.7 units/ml Monitor platelets by anticoagulation protocol: Yes   Plan:  Heparin 3000 units IV bolus, then increase heparin gtt to 1950 units/hr Check heparin level in 6 hours Daily CBC and heparin level Cath sch for 5/28  Adline Potter, PharmD Pharmacy Resident Pager: [phone]

## 2017-09-27 NOTE — Progress Notes (Signed)
  Echocardiogram 2D Echocardiogram has been performed.  Alex Jackson T Alex Jackson 09/27/2017, 1:13 PM 

## 2017-09-27 NOTE — Progress Notes (Signed)
  Echocardiogram 2D Echocardiogram has been performed.  Wynnie Pacetti T Zayley Arras 09/27/2017, 1:13 PM

## 2017-09-27 NOTE — Progress Notes (Signed)
ANTICOAGULATION CONSULT NOTE Pharmacy Consult for heparin Indication: chest pain/ACS  Allergies  Allergen Reactions  . Atorvastatin Other (See Comments)    Pt reports "causes lower extremity muscle aches."  . Prednisone Other (See Comments)    Makes him crazy  . Pravastatin Other (See Comments)    Pt reports "causes bilateral calf cramping."    Patient Measurements: Heparin Dosing Weight: 102.7 kg  Vital Signs: Temp: 98.5 F (36.9 C) (05/27 1404) Temp Source: Oral (05/27 1404) BP: 106/61 (05/27 1404) Pulse Rate: 74 (05/27 1404)  Labs: Recent Labs    09/26/17 1501 09/26/17 2100 09/27/17 0222 09/27/17 0730 09/27/17 1155 09/27/17 1924  HGB 11.9*  --  11.5*  --   --   --   HCT 37.7*  --  36.4*  --   --   --   PLT 270  --  249  --   --   --   HEPARINUNFRC  --   --  <0.10*  --  0.16* 0.30  CREATININE 0.83  --   --  0.78  --   --   TROPONINI  --  0.40* 0.26*  --  0.22*  --    Assessment: 72 y.o. male with chest pain on heparin.   Heparin level therapeutic: 0.30 at low end of goal range, will increase rate to prevent subtherapeutic level. Cardiac cath planned for tomorrow morning.  Goal of Therapy:  Heparin level 0.3-0.7 units/ml Monitor platelets by anticoagulation protocol: Yes   Plan:  Increase heparin gtt to 2050 units/hr Daily CBC and heparin level Cath sch for 5/28  Ruben Im, PharmD Clinical Pharmacist 09/27/2017 7:59 PM

## 2017-09-27 NOTE — Progress Notes (Signed)
Progress Note  Patient Name: Alex Jackson Date of Encounter: 09/27/2017  Primary Cardiologist: Dr. Tobias Alexander  Subjective   No active chest pain or shortness of breath.  No palpitations or abdominal pain.  Inpatient Medications    Scheduled Meds: . aspirin EC  81 mg Oral Daily  . bisoprolol  10 mg Oral Daily  . clopidogrel  75 mg Oral Daily  . fluticasone  1 spray Each Nare Daily  . lisinopril  20 mg Oral Daily  . montelukast  10 mg Oral Daily  . pantoprazole  40 mg Oral Daily  . rosuvastatin  5 mg Oral Daily   Continuous Infusions: . heparin 1,650 Units/hr (09/27/17 0523)   PRN Meds: acetaminophen, nitroGLYCERIN, ondansetron (ZOFRAN) IV   Vital Signs    Vitals:   09/26/17 1800 09/26/17 1930 09/26/17 2009 09/27/17 0622  BP: 131/80 140/72 138/78 (!) 147/87  Pulse: 74 72  76  Resp: (!) Temp:   98.3 F (36.8 C) 98 F (36.7 C)  TempSrc:   Oral Oral  SpO2: 97% 98% 98% 98%  Weight:   279 lb 11.2 oz (126.9 kg)   Height:   5' 11.5" (1.816 m)     Intake/Output Summary (Last 24 hours) at 09/27/2017 0804 Last data filed at 09/27/2017 0500 Gross per 24 hour  Intake 357.08 ml  Output 350 ml  Net 7.08 ml   Filed Weights   09/26/17 1652 09/26/17 2009  Weight: 271 lb 2.7 oz (123 kg) 279 lb 11.2 oz (126.9 kg)    Telemetry    Sinus rhythm.  Personally reviewed.  ECG    Tracing from 09/26/2017 shows sinus rhythm with decreased R wave progression anteriorly.  Personally reviewed.  Physical Exam   GEN: No acute distress.   Neck: No JVD. Cardiac: RRR, no murmur, rub, or gallop.  Respiratory: Nonlabored. Clear to auscultation bilaterally. GI: Soft, nontender, bowel sounds present. MS:  Mild lower leg edema; No deformity. Neuro:  Nonfocal. Psych: Alert and oriented x 3. Normal affect.  Labs    Chemistry Recent Labs  Lab 09/26/17 1501 09/27/17 0730  NA 137 141  K 3.9 3.8  CL 101 104  CO2 25 21*  GLUCOSE 233* 139*  BUN 19 21*    CREATININE 0.83 0.78  CALCIUM 9.1 8.9  GFRNONAA >60 >60  GFRAA >60 >60  ANIONGAP 11 16*     Hematology Recent Labs  Lab 09/26/17 1501 09/27/17 0222  WBC 9.8 9.8  RBC 4.31 4.07*  HGB 11.9* 11.5*  HCT 37.7* 36.4*  MCV 87.5 89.4  MCH 27.6 28.3  MCHC 31.6 31.6  RDW 14.1 14.3  PLT 270 249    Cardiac Enzymes Recent Labs  Lab 09/26/17 2100 09/27/17 0222  TROPONINI 0.40* 0.26*    Recent Labs  Lab 09/26/17 1527  TROPIPOC 0.36*     BNP Recent Labs  Lab 09/26/17 1755  BNP 73.1     Radiology    Dg Chest 2 View  Result Date: 09/26/2017 CLINICAL DATA:  Upper mid chest pain radiating into left shoulder beginning as he got off an airplane today. Hx of night sweats x 2 night. Congestion for 1-2 months. Hx of seasonal allergies.Hx of HTN- on meds. EXAM: CHEST - 2 VIEW COMPARISON:  03/01/2015 FINDINGS: Neck silhouette is normal in size and configuration. No mediastinal or hilar masses. No evidence of adenopathy. Clear lungs.  No pleural effusion or pneumothorax. Skeletal structures are intact. IMPRESSION: No active  cardiopulmonary disease. Electronically Signed   By: Amie Portland M.D.   On: 09/26/2017 15:47    Cardiac Studies   Echocardiogram pending.  Patient Profile     72 y.o. male with history of CAD status post NSTEMI in 2016 with placement of DES to the proximal LAD and otherwise mild nonobstructive disease, hypertension, and hyperlipidemia.  Assessment & Plan    1.  NSTEMI, peak troponin I 0.40.  He has had recent recurring episodes of neck and upper chest discomfort, recently more intense and radiating to the left arm.  2.  Mixed hyperlipidemia.  Current triglycerides 542 total cholesterol 145.  He is on Crestor.  3.  Essential hypertension, on Zebeta and lisinopril.  Systolic blood pressure 130s to 140s.  4.  History of cardiomyopathy with normalization of LVEF as of 2017,  Follow-up echocardiogram pending.  Continue aspirin, Plavix, Zebeta, lisinopril,  Crestor, and heparin.  Echocardiogram pending.  He is scheduled for diagnostic cardiac catheterization tomorrow.  Would add omega-3 supplements to statin in the setting of elevated triglycerides.  Follow-up CBC and BMET in a.m.  Signed, Nona Dell, MD  09/27/2017, 8:04 AM

## 2017-09-27 NOTE — Progress Notes (Signed)
ANTICOAGULATION CONSULT NOTE Pharmacy Consult for heparin Indication: chest pain/ACS  Allergies  Allergen Reactions  . Atorvastatin Other (See Comments)    Pt reports "causes lower extremity muscle aches."  . Prednisone Other (See Comments)    Makes him crazy  . Pravastatin Other (See Comments)    Pt reports "causes bilateral calf cramping."    Patient Measurements: Heparin Dosing Weight: 102.7 kg  Vital Signs: Temp: 98.3 F (36.8 C) (05/26 2009) Temp Source: Oral (05/26 2009) BP: 138/78 (05/26 2009) Pulse Rate: 72 (05/26 1930)  Labs: Recent Labs    09/26/17 1501 09/26/17 2100 09/27/17 0222  HGB 11.9*  --  11.5*  HCT 37.7*  --  36.4*  PLT 270  --  249  HEPARINUNFRC  --   --  <0.10*  CREATININE 0.83  --   --   TROPONINI  --  0.40* 0.26*   Assessment: 72 y.o. male with chest pain for heparin   Goal of Therapy:  Heparin level 0.3-0.7 units/ml Monitor platelets by anticoagulation protocol: Yes   Plan:  Heparin 2000 units IV bolus, then increase heparin  1650 units/hr Check heparin level in 6 hours.   Eddie Candle 09/27/2017,5:02 AM

## 2017-09-28 ENCOUNTER — Inpatient Hospital Stay (HOSPITAL_COMMUNITY)
Admission: EM | Disposition: A | Discharge status: Home / Self Care | Payer: Self-pay | Source: Home / Self Care | Attending: Cardiology

## 2017-09-28 DIAGNOSIS — I10 Essential (primary) hypertension: Secondary | ICD-10-CM

## 2017-09-28 DIAGNOSIS — I2510 Atherosclerotic heart disease of native coronary artery without angina pectoris: Secondary | ICD-10-CM

## 2017-09-28 HISTORY — PX: LEFT HEART CATH AND CORONARY ANGIOGRAPHY: CATH118249

## 2017-09-28 HISTORY — PX: CORONARY STENT INTERVENTION: CATH118234

## 2017-09-28 LAB — BASIC METABOLIC PANEL
Anion gap: 9 (ref 5–15)
BUN: 13 mg/dL (ref 6–20)
CHLORIDE: 104 mmol/L (ref 101–111)
CO2: 25 mmol/L (ref 22–32)
CREATININE: 0.75 mg/dL (ref 0.61–1.24)
Calcium: 9 mg/dL (ref 8.9–10.3)
GFR calc Af Amer: >60 mL/min (ref >60)
GFR calc non Af Amer: >60 mL/min (ref >60)
Glucose, Bld: 141 mg/dL — ABNORMAL HIGH (ref 65–99)
Potassium: 4 mmol/L (ref 3.5–5.1)
SODIUM: 138 mmol/L (ref 135–145)

## 2017-09-28 LAB — HEPARIN LEVEL (UNFRACTIONATED): Heparin Unfractionated: 0.44 IU/mL (ref 0.30–0.70)

## 2017-09-28 LAB — CBC
HCT: 38.1 % — ABNORMAL LOW (ref 39.0–52.0)
Hemoglobin: 12.1 g/dL — ABNORMAL LOW (ref 13.0–17.0)
MCH: 27.6 pg (ref 26.0–34.0)
MCHC: 31.8 g/dL (ref 30.0–36.0)
MCV: 86.8 fL (ref 78.0–100.0)
Platelets: 237 10*3/uL (ref 150–400)
RBC: 4.39 MIL/uL (ref 4.22–5.81)
RDW: 14 % (ref 11.5–15.5)
WBC: 9 10*3/uL (ref 4.0–10.5)

## 2017-09-28 LAB — PROTIME-INR
INR: 1.04
Prothrombin Time: 13.5 seconds (ref 11.4–15.2)

## 2017-09-28 LAB — GLUCOSE, CAPILLARY
GLUCOSE-CAPILLARY: 122 mg/dL — AB (ref 65–99)
Glucose-Capillary: 97 mg/dL (ref 65–99)

## 2017-09-28 LAB — POCT ACTIVATED CLOTTING TIME
Activated Clotting Time: 257 seconds
Activated Clotting Time: 384 seconds

## 2017-09-28 LAB — HIV ANTIBODY (ROUTINE TESTING W REFLEX): HIV SCREEN 4TH GENERATION: NONREACTIVE

## 2017-09-28 SURGERY — LEFT HEART CATH AND CORONARY ANGIOGRAPHY
Anesthesia: LOCAL

## 2017-09-28 MED ORDER — SODIUM CHLORIDE 0.9% FLUSH: 3.0000 mL | INTRAVENOUS | Status: DC | PRN

## 2017-09-28 MED ORDER — HEPARIN SODIUM (PORCINE) 1000 UNIT/ML IJ SOLN
INTRAMUSCULAR | Status: AC
  Filled 2017-09-28: qty 1

## 2017-09-28 MED ORDER — INSULIN ASPART 100 UNIT/ML ~~LOC~~ SOLN: 0.0000 [IU] | Freq: Three times a day (TID) | SUBCUTANEOUS | Status: DC

## 2017-09-28 MED ORDER — HEPARIN SODIUM (PORCINE) 1000 UNIT/ML IJ SOLN
INTRAMUSCULAR | Status: DC | PRN
  Administered 2017-09-28: 6000 [IU] via INTRAVENOUS
  Administered 2017-09-28: 3000 [IU] via INTRAVENOUS
  Administered 2017-09-28: 5000 [IU] via INTRAVENOUS

## 2017-09-28 MED ORDER — LIDOCAINE HCL (PF) 1 % IJ SOLN
INTRAMUSCULAR | Status: DC | PRN
  Administered 2017-09-28: 2 mL

## 2017-09-28 MED ORDER — SODIUM CHLORIDE 0.9% FLUSH: 3.0000 mL | Freq: Two times a day (BID) | INTRAVENOUS | Status: DC

## 2017-09-28 MED ORDER — VERAPAMIL HCL 2.5 MG/ML IV SOLN
INTRAVENOUS | Status: AC
  Filled 2017-09-28: qty 2

## 2017-09-28 MED ORDER — HEPARIN (PORCINE) IN NACL 1000-0.9 UT/500ML-% IV SOLN
INTRAVENOUS | Status: AC
  Filled 2017-09-28: qty 1000

## 2017-09-28 MED ORDER — INSULIN ASPART 100 UNIT/ML ~~LOC~~ SOLN: 0.0000 [IU] | Freq: Every day | SUBCUTANEOUS | Status: DC

## 2017-09-28 MED ORDER — MIDAZOLAM HCL 2 MG/2ML IJ SOLN
INTRAMUSCULAR | Status: DC | PRN
  Administered 2017-09-28 (×2): 1 mg via INTRAVENOUS

## 2017-09-28 MED ORDER — IOHEXOL 350 MG/ML SOLN
INTRAVENOUS | Status: DC | PRN
  Administered 2017-09-28: 140 mL via INTRAVENOUS

## 2017-09-28 MED ORDER — HEPARIN (PORCINE) IN NACL 2-0.9 UNITS/ML
INTRAMUSCULAR | Status: AC | PRN
  Administered 2017-09-28 (×2): 500 mL via INTRA_ARTERIAL

## 2017-09-28 MED ORDER — FENTANYL CITRATE (PF) 100 MCG/2ML IJ SOLN
INTRAMUSCULAR | Status: AC
  Filled 2017-09-28: qty 2

## 2017-09-28 MED ORDER — ACETAMINOPHEN 325 MG PO TABS
ORAL_TABLET | ORAL | Status: AC
  Filled 2017-09-28: qty 2

## 2017-09-28 MED ORDER — NITROGLYCERIN 1 MG/10 ML FOR IR/CATH LAB
INTRA_ARTERIAL | Status: AC
  Filled 2017-09-28: qty 10

## 2017-09-28 MED ORDER — SODIUM CHLORIDE 0.9 % IV SOLN: INTRAVENOUS | Status: AC

## 2017-09-28 MED ORDER — VERAPAMIL HCL 2.5 MG/ML IV SOLN
INTRAVENOUS | Status: DC | PRN
  Administered 2017-09-28: 16:00:00 via INTRA_ARTERIAL

## 2017-09-28 MED ORDER — LORAZEPAM 2 MG/ML IJ SOLN
1.0000 mg | Freq: Once | INTRAMUSCULAR | Status: AC
  Administered 2017-09-28: 1 mg via INTRAVENOUS
  Filled 2017-09-28: qty 1

## 2017-09-28 MED ORDER — LABETALOL HCL 5 MG/ML IV SOLN: 10.0000 mg | INTRAVENOUS | Status: AC | PRN

## 2017-09-28 MED ORDER — LORAZEPAM 2 MG/ML IJ SOLN
1.0000 mg | Freq: Four times a day (QID) | INTRAMUSCULAR | Status: DC | PRN
  Filled 2017-09-28: qty 1

## 2017-09-28 MED ORDER — LIDOCAINE HCL (PF) 1 % IJ SOLN
INTRAMUSCULAR | Status: AC
  Filled 2017-09-28: qty 30

## 2017-09-28 MED ORDER — HYDRALAZINE HCL 20 MG/ML IJ SOLN: 5.0000 mg | INTRAMUSCULAR | Status: AC | PRN

## 2017-09-28 MED ORDER — NITROGLYCERIN 1 MG/10 ML FOR IR/CATH LAB
INTRA_ARTERIAL | Status: DC | PRN
  Administered 2017-09-28 (×3): 200 ug via INTRACORONARY

## 2017-09-28 MED ORDER — MIDAZOLAM HCL 2 MG/2ML IJ SOLN
INTRAMUSCULAR | Status: AC
  Filled 2017-09-28: qty 2

## 2017-09-28 MED ORDER — SODIUM CHLORIDE 0.9 % IV SOLN: 250.0000 mL | INTRAVENOUS | Status: DC | PRN

## 2017-09-28 MED ORDER — FENTANYL CITRATE (PF) 100 MCG/2ML IJ SOLN
INTRAMUSCULAR | Status: DC | PRN
  Administered 2017-09-28 (×2): 25 ug via INTRAVENOUS

## 2017-09-28 SURGICAL SUPPLY — 19 items
BALLN SAPPHIRE 2.75X15 (BALLOONS) ×2
BALLN SAPPHIRE 3.0X12 (BALLOONS) ×2
BALLN SAPPHIRE ~~LOC~~ 3.75X12 (BALLOONS) ×2 IMPLANT
BALLOON SAPPHIRE 2.75X15 (BALLOONS) ×1 IMPLANT
BALLOON SAPPHIRE 3.0X12 (BALLOONS) ×1 IMPLANT
CATH IMPULSE 5F ANG/FL3.5 (CATHETERS) ×2 IMPLANT
CATH VISTA GUIDE 6FR AL1 (CATHETERS) ×2 IMPLANT
DEVICE RAD COMP TR BAND LRG (VASCULAR PRODUCTS) ×2 IMPLANT
GUIDEWIRE INQWIRE 1.5J.035X260 (WIRE) ×1 IMPLANT
INQWIRE 1.5J .035X260CM (WIRE) ×2
KIT ENCORE 26 ADVANTAGE (KITS) ×2 IMPLANT
KIT HEART LEFT (KITS) ×2 IMPLANT
NEEDLE PERC 21GX4CM (NEEDLE) ×2 IMPLANT
PACK CARDIAC CATHETERIZATION (CUSTOM PROCEDURE TRAY) ×2 IMPLANT
SHEATH RAIN RADIAL 21G 6FR (SHEATH) ×2 IMPLANT
STENT SYNERGY DES 3.5X16 (Permanent Stent) ×2 IMPLANT
TRANSDUCER W/STOPCOCK (MISCELLANEOUS) ×2 IMPLANT
TUBING CIL FLEX 10 FLL-RA (TUBING) ×2 IMPLANT
WIRE ASAHI PROWATER 180CM (WIRE) ×2 IMPLANT

## 2017-09-28 NOTE — Progress Notes (Signed)
Patient's telemetry off and heparin stopped for transportation to cath lab. CCMD notified.

## 2017-09-28 NOTE — Interval H&P Note (Signed)
History and Physical Interval Note:  09/28/2017 3:53 PM  Alex Jackson  has presented today for surgery, with the diagnosis of NSTEMI  The various methods of treatment have been discussed with the patient and family. After consideration of risks, benefits and other options for treatment, the patient has consented to  Procedure(s): LEFT HEART CATH AND CORONARY ANGIOGRAPHY (N/A) as a surgical intervention .  The patient's history has been reviewed, patient examined, no change in status, stable for surgery.  I have reviewed the patient's chart and labs.  Questions were answered to the patient's satisfaction.   Cath Lab Visit (complete for each Cath Lab visit)  Clinical Evaluation Leading to the Procedure:   ACS: Yes.    Non-ACS:    Anginal Classification: CCS IV  Anti-ischemic medical therapy: Minimal Therapy (1 class of medications)  Non-Invasive Test Results: No non-invasive testing performed  Prior CABG: No previous CABG        Theron Arista [facility] [place] 09/28/2017 3:54 PM

## 2017-09-28 NOTE — Progress Notes (Signed)
ANTICOAGULATION CONSULT NOTE Pharmacy Consult for heparin Indication: chest pain/ACS  Allergies  Allergen Reactions  . Atorvastatin Other (See Comments)    Pt reports "causes lower extremity muscle aches."  . Prednisone Other (See Comments)    Makes him crazy  . Pravastatin Other (See Comments)    Pt reports "causes bilateral calf cramping."    Patient Measurements: Heparin Dosing Weight: 102.7 kg  Vital Signs: Temp: 98 F (36.7 C) (05/28 0514) Temp Source: Oral (05/28 0514) BP: 149/81 (05/28 0514) Pulse Rate: 80 (05/28 0514)  Labs: Recent Labs    09/26/17 1501 09/26/17 2100  09/27/17 0222 09/27/17 0730 09/27/17 1155 09/27/17 1924 09/28/17 0535  HGB 11.9*  --   --  11.5*  --   --   --  12.1*  HCT 37.7*  --   --  36.4*  --   --   --  38.1*  PLT 270  --   --  249  --   --   --  237  LABPROT  --   --   --   --   --   --   --  13.5  INR  --   --   --   --   --   --   --  1.04  HEPARINUNFRC  --   --    < > <0.10*  --  0.16* 0.30 0.44  CREATININE 0.83  --   --   --  0.78  --   --  0.75  TROPONINI  --  0.40*  --  0.26*  --  0.22*  --   --    < > = values in this interval not displayed.   Assessment: 72 y.o. male with chest pain on heparin for ACS. No AC pta. Dopplers negative for DVT.  Heparin level therapeutic. CBC wnl. No bleed documented. Cardiac cath planned for 5/28.  Goal of Therapy:  Heparin level 0.3-0.7 units/ml Monitor platelets by anticoagulation protocol: Yes   Plan:  Continue heparin gtt at 2050 units/hr Monitor daily heparin level and CBC, s/sx bleeding Cath pending for 5/28  Babs Bertin, PharmD, BCPS Clinical Pharmacist Clinical phone for 09/28/2017 until 3:30pm: [phone] If after 3:30pm, please call main pharmacy at: [phone] 09/28/2017 8:55 AM

## 2017-09-28 NOTE — Consult Note (Signed)
            [facility] CM Primary Care Navigator  09/28/2017  Alex Jackson [birth year] [id number]   Went to see patient at the bedside to identify possible discharge but staff reports that patient is off the unit for a procedure at cath lab. (LEFT HEART CATH AND CORONARY ANGIOGRAPHY)   Will attempt to see patientat another time once he is available in the room.     Addendum (09/29/17):   Went back to seepatientin the roomto identify possible discharge needsbuthe was already Interior and spatial designer. Patient was discharged home today.  Per MD note, patientpresented to the hospitalwith increased chest pain; intermittent burning in his chest at times with exertion, occasional diaphoresis, and cough thought to be due to allergies. (Non-ST elevation myocardial infarction, coronary artery disease with unstable angina pectoris underwent coronary stent placement)   Primary care provider's officeis listed as providingtransition of care (TOC)follow-up.   Patient has discharge instruction to follow-up withcardiology on 10/11/17.      For additional questions please contact:  Karin Golden A. Milessa Hogan, BSN, RN-BC [facility] PRIMARY CARE Navigator Cell: [phone]

## 2017-09-28 NOTE — H&P (View-Only) (Signed)
Progress Note  Patient Name: Alex Jackson Date of Encounter: 09/28/2017  Primary Cardiologist: Tobias Alexander, MD   Subjective   No chest pain or SOB, some anxiety about cath.    Inpatient Medications    Scheduled Meds: . aspirin EC  81 mg Oral Daily  . bisoprolol  10 mg Oral Daily  . clopidogrel  75 mg Oral Daily  . fluticasone  1 spray Each Nare Daily  . lisinopril  20 mg Oral Daily  . montelukast  10 mg Oral Daily  . omega-3 acid ethyl esters  1 g Oral BID  . pantoprazole  40 mg Oral Daily  . rosuvastatin  5 mg Oral Daily  . sodium chloride flush  3 mL Intravenous Q12H   Continuous Infusions: . sodium chloride    . sodium chloride 1 mL/kg/hr (09/28/17 0634)  . heparin 2,050 Units/hr (09/27/17 2324)   PRN Meds: sodium chloride, acetaminophen, nitroGLYCERIN, ondansetron (ZOFRAN) IV, sodium chloride flush   Vital Signs    Vitals:   09/27/17 1404 09/27/17 2130 09/28/17 0514 09/28/17 0931  BP: 106/61 135/74 (!) 149/81 (!) 147/87  Pulse: 74 77 80 79  Resp: (!) 24  Temp: 98.5 F (36.9 C) 97.9 F (36.6 C) 98 F (36.7 C) 98.2 F (36.8 C)  TempSrc: Oral Oral Oral Oral  SpO2: 97% 98% 97%   Weight:      Height:        Intake/Output Summary (Last 24 hours) at 09/28/2017 0938 Last data filed at 09/28/2017 0911 Gross per 24 hour  Intake 888.48 ml  Output 1875 ml  Net -986.52 ml   Filed Weights   09/26/17 1652 09/26/17 2009  Weight: 271 lb 2.7 oz (123 kg) 279 lb 11.2 oz (126.9 kg)    Telemetry    SR - Personally Reviewed  ECG    No new - Personally Reviewed  Physical Exam   GEN: No acute distress.   Neck: No JVD Cardiac: RRR, no murmurs, rubs, or gallops.  Respiratory: Clear to auscultation bilaterally. GI: Soft, nontender, non-distended  MS: No edema; No deformity. Neuro:  Nonfocal  Psych: Normal affect   Labs    Chemistry Recent Labs  Lab 09/26/17 1501 09/27/17 0730 09/28/17 0535  NA 137 141 138  K 3.9 3.8 4.0  CL 101 104  104  CO2 25 21* 25  GLUCOSE 233* 139* 141*  BUN 19 21* 13  CREATININE 0.83 0.78 0.75  CALCIUM 9.1 8.9 9.0  GFRNONAA >60 >60 >60  GFRAA >60 >60 >60  ANIONGAP 11 16* 9     Hematology Recent Labs  Lab 09/26/17 1501 09/27/17 0222 09/28/17 0535  WBC 9.8 9.8 9.0  RBC 4.31 4.07* 4.39  HGB 11.9* 11.5* 12.1*  HCT 37.7* 36.4* 38.1*  MCV 87.5 89.4 86.8  MCH 27.6 28.3 27.6  MCHC 31.6 31.6 31.8  RDW 14.1 14.3 14.0  PLT 270 249 237    Cardiac Enzymes Recent Labs  Lab 09/26/17 2100 09/27/17 0222 09/27/17 1155  TROPONINI 0.40* 0.26* 0.22*    Recent Labs  Lab 09/26/17 1527  TROPIPOC 0.36*     BNP Recent Labs  Lab 09/26/17 1755  BNP 73.1     DDimer No results for input(s): DDIMER in the last 168 hours.   Radiology    Dg Chest 2 View  Result Date: 09/26/2017 CLINICAL DATA:  Upper mid chest pain radiating into left shoulder beginning as he got off an airplane today. Hx of night  sweats x 2 night. Congestion for 1-2 months. Hx of seasonal allergies.Hx of HTN- on meds. EXAM: CHEST - 2 VIEW COMPARISON:  03/01/2015 FINDINGS: Neck silhouette is normal in size and configuration. No mediastinal or hilar masses. No evidence of adenopathy. Clear lungs.  No pleural effusion or pneumothorax. Skeletal structures are intact. IMPRESSION: No active cardiopulmonary disease. Electronically Signed   By: Amie Portland M.D.   On: 09/26/2017 15:47    Cardiac Studies   Echo 09/27/17  Study Conclusions  - Left ventricle: The cavity size was normal. Wall thickness was   increased in a pattern of mild LVH. Systolic function was normal.   The estimated ejection fraction was in the range of 55% to 60%.   Wall motion was normal; there were no regional wall motion   abnormalities. Doppler parameters are consistent with abnormal   left ventricular relaxation (grade 1 diastolic dysfunction). - Mitral valve: Valve area by pressure half-time: 1.67 cm^2.  Impressions:  - Normal LV systolic  function; mild diastolic dysfunction; mild   LVH; trace MR and TR.   Patient Profile     72 y.o. male with history of CAD status post NSTEMI in 2016 with placement of DES to the proximal LAD and otherwise mild nonobstructive disease, hypertension, and hyperlipidemia, now admitted with NSTEMI.   Assessment & Plan    NSTEMI with pk troponin 0.40 for cath today  --echo with mild LVH and EF 55-60% G1DD  CAD with prior DES to pLAD in 2016  HLD with elevated TG at 542 here.  Pt on crestor --lovaza added  HTN   149 systolic  Hx of cardiomyopathy - normalized EF in 2017 and remains normal.  DM -2 metformin held have added SSI insulin.  For questions or updates, please contact [facility] Please consult [email or web address] for contact info under Cardiology/STEMI.     Signed, Nada Boozer, NP  09/28/2017, 9:38 AM    The patient was seen, examined and discussed with Nada Boozer, NP and I agree with the above.   The patient is awaiting cardiac cath - chest pain free, no SOB, troponin is trending down, LVEF 55-60% no new wall motion abnormalities, blood pressure is elevated today- possibly anxiety, no change in meds for now, ativan  iv x 1.  Tobias Alexander, MD 09/28/2017

## 2017-09-28 NOTE — Progress Notes (Addendum)
 Progress Note  Patient Name: Alex Jackson Date of Encounter: 09/28/2017  Primary Cardiologist: Mychele Seyller, MD   Subjective   No chest pain or SOB, some anxiety about cath.    Inpatient Medications    Scheduled Meds: . aspirin EC  81 mg Oral Daily  . bisoprolol  10 mg Oral Daily  . clopidogrel  75 mg Oral Daily  . fluticasone  1 spray Each Nare Daily  . lisinopril  20 mg Oral Daily  . montelukast  10 mg Oral Daily  . omega-3 acid ethyl esters  1 g Oral BID  . pantoprazole  40 mg Oral Daily  . rosuvastatin  5 mg Oral Daily  . sodium chloride flush  3 mL Intravenous Q12H   Continuous Infusions: . sodium chloride    . sodium chloride 1 mL/kg/hr (09/28/17 0634)  . heparin 2,050 Units/hr (09/27/17 2324)   PRN Meds: sodium chloride, acetaminophen, nitroGLYCERIN, ondansetron (ZOFRAN) IV, sodium chloride flush   Vital Signs    Vitals:   09/27/17 1404 09/27/17 2130 09/28/17 0514 09/28/17 0931  BP: 106/61 135/74 (!) 149/81 (!) 147/87  Pulse: 74 77 80 79  Resp: 18 19 17 (!) 24  Temp: 98.5 F (36.9 C) 97.9 F (36.6 C) 98 F (36.7 C) 98.2 F (36.8 C)  TempSrc: Oral Oral Oral Oral  SpO2: 97% 98% 97%   Weight:      Height:        Intake/Output Summary (Last 24 hours) at 09/28/2017 0938 Last data filed at 09/28/2017 0911 Gross per 24 hour  Intake 888.48 ml  Output 1875 ml  Net -986.52 ml   Filed Weights   09/26/17 1652 09/26/17 2009  Weight: 271 lb 2.7 oz (123 kg) 279 lb 11.2 oz (126.9 kg)    Telemetry    SR - Personally Reviewed  ECG    No new - Personally Reviewed  Physical Exam   GEN: No acute distress.   Neck: No JVD Cardiac: RRR, no murmurs, rubs, or gallops.  Respiratory: Clear to auscultation bilaterally. GI: Soft, nontender, non-distended  MS: No edema; No deformity. Neuro:  Nonfocal  Psych: Normal affect   Labs    Chemistry Recent Labs  Lab 09/26/17 1501 09/27/17 0730 09/28/17 0535  NA 137 141 138  K 3.9 3.8 4.0  CL 101 104  104  CO2 25 21* 25  GLUCOSE 233* 139* 141*  BUN 19 21* 13  CREATININE 0.83 0.78 0.75  CALCIUM 9.1 8.9 9.0  GFRNONAA >60 >60 >60  GFRAA >60 >60 >60  ANIONGAP 11 16* 9     Hematology Recent Labs  Lab 09/26/17 1501 09/27/17 0222 09/28/17 0535  WBC 9.8 9.8 9.0  RBC 4.31 4.07* 4.39  HGB 11.9* 11.5* 12.1*  HCT 37.7* 36.4* 38.1*  MCV 87.5 89.4 86.8  MCH 27.6 28.3 27.6  MCHC 31.6 31.6 31.8  RDW 14.1 14.3 14.0  PLT 270 249 237    Cardiac Enzymes Recent Labs  Lab 09/26/17 2100 09/27/17 0222 09/27/17 1155  TROPONINI 0.40* 0.26* 0.22*    Recent Labs  Lab 09/26/17 1527  TROPIPOC 0.36*     BNP Recent Labs  Lab 09/26/17 1755  BNP 73.1     DDimer No results for input(s): DDIMER in the last 168 hours.   Radiology    Dg Chest 2 View  Result Date: 09/26/2017 CLINICAL DATA:  Upper mid chest pain radiating into left shoulder beginning as he got off an airplane today. Hx of night   sweats x 2 night. Congestion for 1-2 months. Hx of seasonal allergies.Hx of HTN- on meds. EXAM: CHEST - 2 VIEW COMPARISON:  03/01/2015 FINDINGS: Neck silhouette is normal in size and configuration. No mediastinal or hilar masses. No evidence of adenopathy. Clear lungs.  No pleural effusion or pneumothorax. Skeletal structures are intact. IMPRESSION: No active cardiopulmonary disease. Electronically Signed   By: Amie Portland M.D.   On: 09/26/2017 15:47    Cardiac Studies   Echo 09/27/17  Study Conclusions  - Left ventricle: The cavity size was normal. Wall thickness was   increased in a pattern of mild LVH. Systolic function was normal.   The estimated ejection fraction was in the range of 55% to 60%.   Wall motion was normal; there were no regional wall motion   abnormalities. Doppler parameters are consistent with abnormal   left ventricular relaxation (grade 1 diastolic dysfunction). - Mitral valve: Valve area by pressure half-time: 1.67 cm^2.  Impressions:  - Normal LV systolic  function; mild diastolic dysfunction; mild   LVH; trace MR and TR.   Patient Profile     72 y.o. male with history of CAD status post NSTEMI in 2016 with placement of DES to the proximal LAD and otherwise mild nonobstructive disease, hypertension, and hyperlipidemia, now admitted with NSTEMI.   Assessment & Plan    NSTEMI with pk troponin 0.40 for cath today  --echo with mild LVH and EF 55-60% G1DD  CAD with prior DES to pLAD in 2016  HLD with elevated TG at 542 here.  Pt on crestor --lovaza added  HTN   149 systolic  Hx of cardiomyopathy - normalized EF in 2017 and remains normal.  DM -2 metformin held have added SSI insulin.  For questions or updates, please contact [facility] Please consult [email or web address] for contact info under Cardiology/STEMI.     Signed, Nada Boozer, NP  09/28/2017, 9:38 AM    The patient was seen, examined and discussed with Nada Boozer, NP and I agree with the above.   The patient is awaiting cardiac cath - chest pain free, no SOB, troponin is trending down, LVEF 55-60% no new wall motion abnormalities, blood pressure is elevated today- possibly anxiety, no change in meds for now, ativan  iv x 1.  Tobias Alexander, MD 09/28/2017

## 2017-09-29 ENCOUNTER — Encounter (HOSPITAL_COMMUNITY): Payer: Self-pay | Admitting: Cardiology

## 2017-09-29 DIAGNOSIS — Z955 Presence of coronary angioplasty implant and graft: Secondary | ICD-10-CM

## 2017-09-29 DIAGNOSIS — E785 Hyperlipidemia, unspecified: Secondary | ICD-10-CM

## 2017-09-29 LAB — CBC
HEMATOCRIT: 33.8 % — AB (ref 39.0–52.0)
HEMOGLOBIN: 10.7 g/dL — AB (ref 13.0–17.0)
MCH: 27.5 pg (ref 26.0–34.0)
MCHC: 31.7 g/dL (ref 30.0–36.0)
MCV: 86.9 fL (ref 78.0–100.0)
PLATELETS: 223 10*3/uL (ref 150–400)
RBC: 3.89 MIL/uL — ABNORMAL LOW (ref 4.22–5.81)
RDW: 14.1 % (ref 11.5–15.5)
WBC: 7.7 10*3/uL (ref 4.0–10.5)

## 2017-09-29 LAB — BASIC METABOLIC PANEL
ANION GAP: 7 (ref 5–15)
BUN: 12 mg/dL (ref 6–20)
CHLORIDE: 102 mmol/L (ref 101–111)
CO2: 30 mmol/L (ref 22–32)
Calcium: 9 mg/dL (ref 8.9–10.3)
Creatinine, Ser: 0.81 mg/dL (ref 0.61–1.24)
GFR calc non Af Amer: >60 mL/min (ref >60)
Glucose, Bld: 132 mg/dL — ABNORMAL HIGH (ref 65–99)
POTASSIUM: 4.1 mmol/L (ref 3.5–5.1)
SODIUM: 139 mmol/L (ref 135–145)

## 2017-09-29 MED ORDER — PANTOPRAZOLE SODIUM 40 MG PO TBEC: 40.0000 mg | DELAYED_RELEASE_TABLET | Freq: Every day | ORAL | 6 refills | Status: DC

## 2017-09-29 MED ORDER — OMEGA-3-ACID ETHYL ESTERS 1 G PO CAPS: 1.0000 g | ORAL_CAPSULE | Freq: Two times a day (BID) | ORAL | 6 refills | Status: DC

## 2017-09-29 MED ORDER — METFORMIN HCL 500 MG PO TABS: 500.0000 mg | ORAL_TABLET | Freq: Two times a day (BID) | ORAL | 5 refills | Status: DC

## 2017-09-29 MED ORDER — ACETAMINOPHEN 325 MG PO TABS: 650.0000 mg | ORAL_TABLET | ORAL | Status: DC | PRN

## 2017-09-29 MED ORDER — LISINOPRIL 20 MG PO TABS: 20.0000 mg | ORAL_TABLET | Freq: Every day | ORAL | 6 refills | Status: DC

## 2017-09-29 MED ORDER — NITROGLYCERIN 0.4 MG SL SUBL: 0.4000 mg | SUBLINGUAL_TABLET | SUBLINGUAL | 4 refills | Status: DC | PRN

## 2017-09-29 MED FILL — Heparin Sod (Porcine)-NaCl IV Soln 1000 Unit/500ML-0.9%: INTRAVENOUS | Qty: 1000 | Status: AC

## 2017-09-29 NOTE — Progress Notes (Signed)
Pt eating breakfast, looks good. Ed completed. Understands importance of Plavix/ASA. Will refer to [facility] CRPII. Reviewed diet and ex at home and NTG. Pt will walk independently. [id number] Ethelda Chick CES, ACSM 9:18 AM 09/29/2017

## 2017-09-29 NOTE — Research (Signed)
Open in error

## 2017-09-29 NOTE — Progress Notes (Addendum)
Progress Note  Patient Name: Alex Jackson Date of Encounter: 09/29/2017  Primary Cardiologist: Tobias Alexander, MD   Subjective   No chest pain or SOB. He is feeling great.  Inpatient Medications    Scheduled Meds: . aspirin EC  81 mg Oral Daily  . bisoprolol  10 mg Oral Daily  . clopidogrel  75 mg Oral Daily  . fluticasone  1 spray Each Nare Daily  . insulin aspart  0-5 Units Subcutaneous QHS  . insulin aspart  0-9 Units Subcutaneous TID WC  . lisinopril  20 mg Oral Daily  . montelukast  10 mg Oral Daily  . omega-3 acid ethyl esters  1 g Oral BID  . pantoprazole  40 mg Oral Daily  . rosuvastatin  5 mg Oral Daily  . sodium chloride flush  3 mL Intravenous Q12H   Continuous Infusions: . sodium chloride     PRN Meds: sodium chloride, acetaminophen, LORazepam, nitroGLYCERIN, ondansetron (ZOFRAN) IV, sodium chloride flush   Vital Signs    Vitals:   09/28/17 2115 09/28/17 2359 09/29/17 0329 09/29/17 0700  BP: (!) 148/80 133/65 135/74 128/73  Pulse: 70 77 78 77  Resp: (!) 22 (!) Temp: 98.4 F (36.9 C) 97.7 F (36.5 C) 97.6 F (36.4 C) 98.6 F (37 C)  TempSrc: Oral Oral Oral Oral  SpO2: 97% 99% 100% 95%  Weight:      Height:        Intake/Output Summary (Last 24 hours) at 09/29/2017 0849 Last data filed at 09/29/2017 0000 Gross per 24 hour  Intake 1180 ml  Output 650 ml  Net 530 ml   Filed Weights   09/26/17 1652 09/26/17 2009  Weight: 271 lb 2.7 oz (123 kg) 279 lb 11.2 oz (126.9 kg)    Telemetry    SR - Personally Reviewed  ECG    No new - Personally Reviewed  Physical Exam   GEN: No acute distress.   Neck: No JVD Cardiac: RRR, no murmurs, rubs, or gallops.  Respiratory: Clear to auscultation bilaterally. GI: Soft, nontender, non-distended  MS: No edema; No deformity. Neuro:  Nonfocal  Psych: Normal affect   Labs    Chemistry Recent Labs  Lab 09/27/17 0730 09/28/17 0535 09/29/17 0323  NA 141 138 139  K 3.8 4.0 4.1    CL 104 104 102  CO2 21* 25 30  GLUCOSE 139* 141* 132*  BUN 21* 13 12  CREATININE 0.78 0.75 0.81  CALCIUM 8.9 9.0 9.0  GFRNONAA >60 >60 >60  GFRAA >60 >60 >60  ANIONGAP 16* 9 7     Hematology Recent Labs  Lab 09/27/17 0222 09/28/17 0535 09/29/17 0323  WBC 9.8 9.0 7.7  RBC 4.07* 4.39 3.89*  HGB 11.5* 12.1* 10.7*  HCT 36.4* 38.1* 33.8*  MCV 89.4 86.8 86.9  MCH 28.3 27.6 27.5  MCHC 31.6 31.8 31.7  RDW 14.3 14.0 14.1  PLT 249 237 223    Cardiac Enzymes Recent Labs  Lab 09/26/17 2100 09/27/17 0222 09/27/17 1155  TROPONINI 0.40* 0.26* 0.22*    Recent Labs  Lab 09/26/17 1527  TROPIPOC 0.36*     BNP Recent Labs  Lab 09/26/17 1755  BNP 73.1     DDimer No results for input(s): DDIMER in the last 168 hours.   Radiology    No results found.  Cardiac Studies   Echo 09/27/17  Study Conclusions  - Left ventricle: The cavity size was normal. Wall thickness was  increased in a pattern of mild LVH. Systolic function was normal.   The estimated ejection fraction was in the range of 55% to 60%.   Wall motion was normal; there were no regional wall motion   abnormalities. Doppler parameters are consistent with abnormal   left ventricular relaxation (grade 1 diastolic dysfunction). - Mitral valve: Valve area by pressure half-time: 1.67 cm^2.  Impressions:  - Normal LV systolic function; mild diastolic dysfunction; mild   LVH; trace MR and TR.  LHC: 09/28/2017    Previously placed Prox LAD drug eluting stent is widely patent.  Mid RCA to Dist RCA lesion is 30% stenosed.  RPDA lesion is 20% stenosed.  Dist RCA lesion is 90% stenosed with 99% stenosed side branch in Post Atrio.  A drug-eluting stent was successfully placed using a STENT SYNERGY DES [id number].  Post intervention, there is a 0% residual stenosis.  Post intervention, the side branch was reduced to 0% residual stenosis.  Ost RPDA lesion is 60% stenosed.  Balloon angioplasty was  performed.  Post intervention, there is a 0% residual stenosis.  LV end diastolic pressure is normal.   1. Single vessel obstructive CAD  2. Patent stent in the LAD 3. Normal LVEDP 4. Successful PCI with stenting of the distal RCA into the PLOM with a DES. Balloon angioplasty of the PDA through the stent struts.    Patient Profile     72 y.o. male with history of CAD status post NSTEMI in 2016 with placement of DES to the proximal LAD and otherwise mild nonobstructive disease, hypertension, and hyperlipidemia, now admitted with NSTEMI.   Assessment & Plan    NSTEMI with pk troponin 0.40, s/p Single vessel obstructive CAD, Patent stent in the LAD, Normal LVEDP, Successful PCI with stenting of the distal RCA into the PLOM with a DES. Balloon angioplasty of the PDA through the stent struts.  --echo with mild LVH and EF 55-60% G1DD - stable acces right radial site, good peripheral pulses  CAD with prior DES to pLAD in 2016  HLD with elevated TG at 542 here.  Pt on crestor --lovaza added  HTN   149 systolic  Hx of cardiomyopathy - normalized EF in 2017 and remains normal.  DM -2 metformin held have added SSI insulin.  Discharge today, we will arrange a follow up.  For questions or updates, please contact [facility] Please consult [email or web address] for contact info under Cardiology/STEMI.     Tobias Alexander, MD 09/29/2017

## 2017-09-29 NOTE — Discharge Instructions (Signed)
Call [facility] at [phone] if any bleeding, swelling or drainage at cath site.  May shower, no tub baths for 48 hours for groin sticks. No lifting over 5 pounds for 3 days.  No Driving for 3 days.  Heart Healthy Diabetic Diet   No Metformin until 10/01/17 it may interact with cath dye, the dye will be out of your system by the [address].   Call for questions or problems.

## 2017-09-29 NOTE — Discharge Summary (Signed)
Discharge Summary    Patient ID: Alex Jackson,  MRN: [id number], DOB/AGE: 07/22/1945 72 y.o.  Admit date: 09/26/2017 Discharge date: 09/29/2017  Primary Care Provider: [name] Primary Cardiologist: Tobias Alexander, MD  Discharge Diagnoses    Principal Problem:   NSTEMI (non-ST elevated myocardial infarction) [facility]) Active Problems:   Coronary artery disease involving native coronary artery of native heart with unstable angina pectoris (HCC)   Hyperlipidemia   S/P drug eluting coronary stent placement   Allergies Allergies  Allergen Reactions  . Atorvastatin Other (See Comments)    Pt reports "causes lower extremity muscle aches."  . Prednisone Other (See Comments)    Makes him crazy  . Pravastatin Other (See Comments)    Pt reports "causes bilateral calf cramping."    Diagnostic Studies/Procedures    Cardiac cath Conclusion     Previously placed Prox LAD drug eluting stent is widely patent.  Mid RCA to Dist RCA lesion is 30% stenosed.  RPDA lesion is 20% stenosed.  Dist RCA lesion is 90% stenosed with 99% stenosed side branch in Post Atrio.  A drug-eluting stent was successfully placed using a STENT SYNERGY DES 3.5X16.  Post intervention, there is a 0% residual stenosis.  Post intervention, the side branch was reduced to 0% residual stenosis.  Ost RPDA lesion is 60% stenosed.  Balloon angioplasty was performed.  Post intervention, there is a 0% residual stenosis.  LV end diastolic pressure is normal.  1. Single vessel obstructive CAD  2. Patent stent in the LAD 3. Normal LVEDP 4. Successful PCI with stenting of the distal RCA into the PLOM with a DES. Balloon angioplasty of the PDA through the stent struts.   Plan: DAPT for one year. Risk factor modification. Anticipate DC in am.    Echo 09/27/17 Study Conclusions  - Left ventricle: The cavity size was normal. Wall thickness was increased in a pattern of mild LVH. Systolic  function was normal. The estimated ejection fraction was in the range of 55% to 60%. Wall motion was normal; there were no regional wall motion abnormalities. Doppler parameters are consistent with abnormal left ventricular relaxation (grade 1 diastolic dysfunction). - Mitral valve: Valve area by pressure half-time: 1.67 cm^2.  Impressions:  - Normal LV systolic function; mild diastolic dysfunction; mild LVH; trace MR and TR.    _____________   History of Present Illness     72 yom with hx of CAD and prior NSTEMI with stent to LAD and EF at that time 34-50% then subsequent normalization, HTN, HLD presented to [facility] with chest pain; intermittent burning in his chest at times with exertion.  occ diaphoresis, and cough thought to be due to allergies.  On day of admit he had increased chest burning with traveling.  Pt presented to ER.    EKG with SR and poor R wave progression, troponin at 0.36.  IV Heparin was started.  He was admitted with plan for cardiac cath.      Hospital Course     Consultants: none  Pt's pk troponin 0.40.  Pt was stable on IV heparin and underwent cath and PCI as noted above.    Today pt has been seen and evaluated by Dr. Delton See and found stable for discharge.  Labs stable.  Pt on statin, bb, plavix and ASA  Pt's metformin will be held 48 hours post cath to prevent interaction with cath dye.    Pt to ambulate wit cardiac rehab and then discharge.  _____________  Discharge Vitals Blood pressure 128/73, pulse 77, temperature 98.6 F (37 C), temperature source Oral, resp. rate 17, height 5' 11.5" (1.816 m), weight 279 lb 11.2 oz (126.9 kg), SpO2 95 %.  Filed Weights   09/26/17 1652 09/26/17 2009  Weight: 271 lb 2.7 oz (123 kg) 279 lb 11.2 oz (126.9 kg)    Labs & Radiologic Studies    CBC Recent Labs    09/28/17 0535 09/29/17 0323  WBC 9.0 7.7  HGB 12.1* 10.7*  HCT 38.1* 33.8*  MCV 86.8 86.9  PLT 237 223   Basic Metabolic  Panel Recent Labs    09/28/17 0535 09/29/17 0323  NA 138 139  K 4.0 4.1  CL 104 102  CO2 25 30  GLUCOSE 141* 132*  BUN 13 12  CREATININE 0.75 0.81  CALCIUM 9.0 9.0   Liver Function Tests No results for input(s): AST, ALT, ALKPHOS, BILITOT, PROT, ALBUMIN in the last 72 hours. No results for input(s): LIPASE, AMYLASE in the last 72 hours. Cardiac Enzymes Recent Labs    09/26/17 2100 09/27/17 0222 09/27/17 1155  TROPONINI 0.40* 0.26* 0.22*   BNP Invalid input(s): POCBNP D-Dimer No results for input(s): DDIMER in the last 72 hours. Hemoglobin A1C Recent Labs    09/26/17 2100  HGBA1C 6.1*   Fasting Lipid Panel Recent Labs    09/27/17 0222  CHOL 145  HDL 27*  LDLCALC UNABLE TO CALCULATE IF TRIGLYCERIDE OVER 400 mg/dL  TRIG 409*  CHOLHDL 5.4   Thyroid Function Tests No results for input(s): TSH, T4TOTAL, T3FREE, THYROIDAB in the last 72 hours.  Invalid input(s): FREET3 _____________  Dg Chest 2 View  Result Date: 09/26/2017 CLINICAL DATA:  Upper mid chest pain radiating into left shoulder beginning as he got off an airplane today. Hx of night sweats x 2 night. Congestion for 1-2 months. Hx of seasonal allergies.Hx of HTN- on meds. EXAM: CHEST - 2 VIEW COMPARISON:  03/01/2015 FINDINGS: Neck silhouette is normal in size and configuration. No mediastinal or hilar masses. No evidence of adenopathy. Clear lungs.  No pleural effusion or pneumothorax. Skeletal structures are intact. IMPRESSION: No active cardiopulmonary disease. Electronically Signed   By: Amie Portland M.D.   On: 09/26/2017 15:47   Mr Prostate W WJ Contrast  Result Date: 08/31/2017 CLINICAL DATA:  Elevated PSA equal 58.7. EXAM: MR PROSTATE WITHOUT AND WITH CONTRAST TECHNIQUE: Multiplanar multisequence MRI images were obtained of the pelvis centered about the prostate. Pre and post contrast images were obtained. CONTRAST:  20mL MULTIHANCE GADOBENATE DIMEGLUMINE 529 MG/ML IV SOLN COMPARISON:  None. FINDINGS:  Prostate: Uniform high signal intensity within the peripheral zone on T2 weighted imaging (series 7). No evidence restricted diffusion in the peripheral zone. The transitional zone is mildly expanded with well capsulated nodules. No clear restricted diffusion within the transitional zone. Prostatic capsule is intact. Volume: 3.9 x 4.7 x 5.0 cm (volume = 48 cm^3) Transcapsular spread:  Absent Seminal vesicle involvement: Absent. Seminal vesicles are diminutive. Neurovascular bundle involvement: Absent Pelvic adenopathy: Single 10 mm RIGHT external iliac lymph node (image 11/3). Bone metastasis: Absent Other findings: None IMPRESSION: 1. No high-grade carcinoma within the peripheral zone (BI-RADS 1). 2. Nodular transitional zone most consistent with benign prostate hypertrophy. Electronically Signed   By: Genevive Bi M.D.   On: 08/31/2017 15:27   Disposition   Pt is being discharged home today in good condition.  Follow-up Plans & Appointments   Call [facility] at [phone] if  any bleeding, swelling or drainage at cath site.  May shower, no tub baths for 48 hours for groin sticks. No lifting over 5 pounds for 3 days.  No Driving for 3 days.  Heart Healthy Diabetic Diet   No Metformin until 10/01/17 it may interact with cath dye, the dye will be out of your system by the [address].   Call for questions or problems.   Follow-up Information    Lars Masson, MD Follow up on 10/11/2017.   Specialty:  Cardiology Why:  at 10:00 AM with her PA Vin Contact information: [address] [place] [address] [phone]          Discharge Instructions    AMB Referral to Cardiac Rehabilitation - Phase II   Complete by:  As directed    Diagnosis:  Coronary Stents      Discharge Medications   Allergies as of 09/29/2017      Reactions   Atorvastatin Other (See Comments)   Pt reports "causes lower extremity muscle aches."   Prednisone Other (See  Comments)   Makes him crazy   Pravastatin Other (See Comments)   Pt reports "causes bilateral calf cramping."      Medication List    STOP taking these medications   ibuprofen 200 MG tablet Commonly known as:  ADVIL,MOTRIN   lisinopril-hydrochlorothiazide 20-12.5 MG tablet Commonly known as:  PRINZIDE,ZESTORETIC   naproxen sodium 220 MG tablet Commonly known as:  ALEVE   omeprazole 20 MG capsule Commonly known as:  PRILOSEC Replaced by:  pantoprazole 40 MG tablet     TAKE these medications   acetaminophen 325 MG tablet Commonly known as:  TYLENOL Take 2 tablets (650 mg total) by mouth every 4 (four) hours as needed for headache or mild pain.   aspirin EC 81 MG tablet Take 81 mg by mouth daily.   bisoprolol 10 MG tablet Commonly known as:  ZEBETA Take 1 tablet (10 mg total) by mouth daily.   clopidogrel 75 MG tablet Commonly known as:  PLAVIX Take 1 tablet (75 mg total) by mouth daily.   fluticasone 50 MCG/ACT nasal spray Commonly known as:  FLONASE USE 1 SPRAY IN EACH NOSTRIL EVERY DAY   furosemide 20 MG tablet Commonly known as:  LASIX Take 1 tablet (20 mg total) by mouth daily as needed. swelling   hydroxypropyl methylcellulose / hypromellose 2.5 % ophthalmic solution Commonly known as:  ISOPTO TEARS / GONIOVISC Place 1 drop into both eyes as needed for dry eyes.   ICY HOT 5 % Ptch Generic drug:  Menthol Apply 1 patch topically as needed (back pain).   lidocaine 5 % Commonly known as:  LIDODERM Place 1 patch onto the skin daily. Remove & Discard patch within 12 hours or as directed by MD   lisinopril 20 MG tablet Commonly known as:  PRINIVIL,ZESTRIL Take 1 tablet (20 mg total) by mouth daily. Start taking on:  09/30/2017   metFORMIN 500 MG tablet Commonly known as:  GLUCOPHAGE Take 1 tablet (500 mg total) by mouth 2 (two) times daily. Start taking on:  10/01/2017 What changed:  These instructions start on 10/01/2017. If you are unsure what to do  until then, ask your doctor or other care provider.   montelukast 10 MG tablet Commonly known as:  SINGULAIR Take 10 mg by mouth daily.   multivitamin with minerals Tabs tablet Take 1 tablet by mouth daily.   nitroGLYCERIN 0.4 MG SL tablet Commonly known  as:  NITROSTAT Place 1 tablet (0.4 mg total) under the tongue every 5 (five) minutes x 3 doses as needed for chest pain.   omega-3 acid ethyl esters 1 g capsule Commonly known as:  LOVAZA Take 1 capsule (1 g total) by mouth 2 (two) times daily.   pantoprazole 40 MG tablet Commonly known as:  PROTONIX Take 1 tablet (40 mg total) by mouth daily. Start taking on:  09/30/2017 Replaces:  omeprazole 20 MG capsule   rosuvastatin 5 MG tablet Commonly known as:  CRESTOR TAKE 1 TABLET BY MOUTH EVERY DAY        Aspirin prescribed at discharge?  Yes High Intensity Statin Prescribed? (Lipitor 40-80mg  or Crestor 20-40mg ): Yes Beta Blocker Prescribed? Yes For EF <40%, was ACEI/ARB Prescribed? No: na but is prescribed for HTN ADP Receptor Inhibitor Prescribed? (i.e. Plavix etc.-Includes Medically Managed Patients): Yes For EF <40%, Aldosterone Inhibitor Prescribed? No: na Was EF assessed during THIS hospitalization? Yes Was Cardiac Rehab II ordered? (Included Medically managed Patients): Yes   Outstanding Labs/Studies   BMP and CBC  Duration of Discharge Encounter   Greater than 30 minutes including physician time.  Signed, Sharlotte Alamo, NP 09/29/2017, 9:16 AM

## 2017-10-06 ENCOUNTER — Encounter: Payer: Self-pay | Admitting: Physician Assistant

## 2017-10-06 NOTE — Progress Notes (Signed)
Cardiology Office Note    Date:  10/11/2017   ID:  Alex PrestoRobert W Jackson, DOB [birth year], MRN [id number]  PCP:  Farris HasMorrow, Aaron, MD  Primary Cardiologist: Tobias AlexanderKatarina Nelson, MD  Chief Complaint: Hospital follow up  History of Present Illness:   Alex Jackson is a 72 y.o. male with hx of CAD and prior NSTEMI with stent to LAD in 2016 and EF at that time 34-50% then subsequent normalization, HTN, HLD presents for follow up.   Admited 5/26-5/29/19 with NSTEMI. Peak of troponin 0.40. Treated with heparin. Cath showed Dist RCA lesion is 90% stenosed with 99% stenosed side branch in Post Atrio. S/p successful PCI with stenting of the distal RCA into the PLOM with a DES. Balloon angioplasty of the PDA through the stent struts. Echo with LVEF of 55-60% with grade 1 DD.   Here today for follow up. His only complain is bilateral LE edema. Compliant with low sodium diet. Does not check his weight. He as gained 2 lb since discharge and 11 lb since OV in 06/2017. No chest pain, SOB, orthopnea, PNd, dizziness, melena or syncope. Compliant with medications.   Past Medical History:  Diagnosis Date  . CAD in native artery 03/01/2015   1. cath - DES to LAD residual non obstructive RCA disease in 2016 b. Cath s/p Successful PCI with stenting of the distal RCA into the PLOM with a DES. Balloon angioplasty of the PDA through the stent struts.   . Hyperlipidemia   . Hypertension     Past Surgical History:  Procedure Laterality Date  . CARDIAC CATHETERIZATION N/A 03/01/2015   Procedure: Left Heart Cath and Coronary Angiography;  Surgeon: Kathleene Hazelhristopher D McAlhany, MD;  [facility];  Service: Cardiovascular;  Laterality: N/A;  . CORONARY STENT INTERVENTION N/A 09/28/2017   Procedure: CORONARY STENT INTERVENTION;  Surgeon: SwazilandJordan, Peter M, MD;  [facility];  Service: Cardiovascular;  Laterality: N/A;  . LEFT HEART CATH AND CORONARY ANGIOGRAPHY N/A 09/28/2017   Procedure: LEFT HEART  CATH AND CORONARY ANGIOGRAPHY;  Surgeon: SwazilandJordan, Peter M, MD;  [facility];  Service: Cardiovascular;  Laterality: N/A;  . NO PAST SURGERIES      Current Medications: Prior to Admission medications   Medication Sig Start Date End Date Taking? Authorizing Provider  acetaminophen (TYLENOL) 325 MG tablet Take 2 tablets (650 mg total) by mouth every 4 (four) hours as needed for headache or mild pain. 09/29/17   Leone BrandIngold, Laura R, NP  aspirin EC 81 MG tablet Take 81 mg by mouth daily.    [provider]  bisoprolol (ZEBETA) 10 MG tablet Take 1 tablet (10 mg total) by mouth daily. 06/07/17   Lars MassonNelson, Katarina H, MD  clopidogrel (PLAVIX) 75 MG tablet Take 1 tablet (75 mg total) by mouth daily. 06/07/17   Lars MassonNelson, Katarina H, MD  fluticasone (FLONASE) 50 MCG/ACT nasal spray USE 1 SPRAY IN EACH NOSTRIL EVERY DAY 01/11/15   [provider]  furosemide (LASIX) 20 MG tablet Take 1 tablet (20 mg total) by mouth daily as needed. swelling 06/07/17   Lars MassonNelson, Katarina H, MD  hydroxypropyl methylcellulose / hypromellose (ISOPTO TEARS / GONIOVISC) 2.5 % ophthalmic solution Place 1 drop into both eyes as needed for dry eyes.    [provider]  lidocaine (LIDODERM) 5 % Place 1 patch onto the skin daily. Remove & Discard patch within 12 hours or as directed by MD    [provider]  lisinopril (PRINIVIL,ZESTRIL) 20 MG tablet Take 1 tablet (20 mg total) by mouth daily. 09/30/17   Leone Brand, NP  Menthol (ICY HOT) 5 % PTCH Apply 1 patch topically as needed (back pain).    [provider]  metFORMIN (GLUCOPHAGE) 500 MG tablet Take 1 tablet (500 mg total) by mouth 2 (two) times daily. 10/01/17   Leone Brand, NP  montelukast (SINGULAIR) 10 MG tablet Take 10 mg by mouth daily. 02/04/17   [provider]  Multiple Vitamin (MULTIVITAMIN WITH MINERALS) TABS tablet Take 1 tablet by mouth daily.    [provider]  nitroGLYCERIN (NITROSTAT) 0.4 MG SL  tablet Place 1 tablet (0.4 mg total) under the tongue every 5 (five) minutes x 3 doses as needed for chest pain. 09/29/17   Leone Brand, NP  omega-3 acid ethyl esters (LOVAZA) 1 g capsule Take 1 capsule (1 g total) by mouth 2 (two) times daily. 09/29/17   Leone Brand, NP  pantoprazole (PROTONIX) 40 MG tablet Take 1 tablet (40 mg total) by mouth daily. 09/30/17   Leone Brand, NP  rosuvastatin (CRESTOR) 5 MG tablet TAKE 1 TABLET BY MOUTH EVERY DAY 06/07/17   Lars Masson, MD    Allergies:   Atorvastatin; Prednisone; and Pravastatin   Social History   Socioeconomic History  . Marital status: Divorced    Spouse name: Not on file  . Number of children: Not on file  . Years of education: Not on file  . Highest education level: Not on file  Occupational History  . Not on file  Social Needs  . Financial resource strain: Not on file  . Food insecurity:    Worry: Not on file    Inability: Not on file  . Transportation needs:    Medical: Not on file    Non-medical: Not on file  Tobacco Use  . Smoking status: Former Smoker    Packs/day: 1.00    Last attempt to quit: 07/03/1983    Years since quitting: 34.2  . Smokeless tobacco: Never Used  . Tobacco comment: quit in Mar 1985  Substance and Sexual Activity  . Alcohol use: Yes    Alcohol/week: 0.0 oz    Comment: "one or two drinks a night"  . Drug use: No  . Sexual activity: Not on file  Lifestyle  . Physical activity:    Days per week: Not on file    Minutes per session: Not on file  . Stress: Not on file  Relationships  . Social connections:    Talks on phone: Not on file    Gets together: Not on file    Attends religious service: Not on file    Active member of club or organization: Not on file    Attends meetings of clubs or organizations: Not on file    Relationship status: Not on file  Other Topics Concern  . Not on file  Social History Narrative  . Not on file     Family History:  The patient's family  history includes Arrhythmia in his sister; Liver cancer in his father; Thyroid cancer in his mother.   ROS:   Please see the history of present illness.    ROS All other systems reviewed and are negative.   PHYSICAL EXAM:   VS:  BP 140/82   Pulse 77   Ht 5' 11.5" (1.816 m)   Wt 281 lb (127.5 kg)   BMI 38.65 kg/m  GEN: Well nourished, well developed, in no acute distress  HEENT: normal  Neck: no JVD, carotid bruits, or masses Cardiac: RRR; no murmurs, rubs, or gallops,2 + BL LE edema  Respiratory:  clear to auscultation bilaterally, normal work of breathing GI: soft, nontender, nondistended, + BS MS: no deformity or atrophy  Skin: warm and dry, no rash Neuro:  Alert and Oriented x 3, Strength and sensation are intact Psych: euthymic mood, full affect  Wt Readings from Last 3 Encounters:  10/11/17 281 lb (127.5 kg)  09/26/17 279 lb 11.2 oz (126.9 kg)  06/07/17 270 lb 3.2 oz (122.6 kg)      Studies/Labs Reviewed:   EKG:  EKG is ordered today.  The ekg ordered today demonstrates NSR  Recent Labs: 09/26/2017: B Natriuretic Peptide 73.1 09/29/2017: BUN 12; Creatinine, Ser 0.81; Hemoglobin 10.7; Platelets 223; Potassium 4.1; Sodium 139   Lipid Panel    Component Value Date/Time   CHOL 145 09/27/2017 0222   CHOL 137 01/01/2017 0829   TRIG 542 (H) 09/27/2017 0222   HDL 27 (L) 09/27/2017 0222   HDL 35 (L) 01/01/2017 0829   CHOLHDL 5.4 09/27/2017 0222   VLDL UNABLE TO CALCULATE IF TRIGLYCERIDE OVER 400 mg/dL 40/98/1191 4782   LDLCALC UNABLE TO CALCULATE IF TRIGLYCERIDE OVER 400 mg/dL 95/62/1308 6578   LDLCALC 76 01/01/2017 0829    Additional studies/ records that were reviewed today include:   Cardiac cath 09/28/2017 Conclusion     Previously placed Prox LAD drug eluting stent is widely patent.  Mid RCA to Dist RCA lesion is 30% stenosed.  RPDA lesion is 20% stenosed.  Dist RCA lesion is 90% stenosed with 99% stenosed side branch in Post Atrio.  A  drug-eluting stent was successfully placed using a STENT SYNERGY DES 3.5X16.  Post intervention, there is a 0% residual stenosis.  Post intervention, the side branch was reduced to 0% residual stenosis.  Ost RPDA lesion is 60% stenosed.  Balloon angioplasty was performed.  Post intervention, there is a 0% residual stenosis.  LV end diastolic pressure is normal.  1. Single vessel obstructive CAD  2. Patent stent in the LAD 3. Normal LVEDP 4. Successful PCI with stenting of the distal RCA into the PLOM with a DES. Balloon angioplasty of the PDA through the stent struts.   Plan: DAPT for one year. Risk factor modification. Anticipate DC in am.    Echo 09/27/17 Study Conclusions  - Left ventricle: The cavity size was normal. Wall thickness was increased in a pattern of mild LVH. Systolic function was normal. The estimated ejection fraction was in the range of 55% to 60%. Wall motion was normal; there were no regional wall motion abnormalities. Doppler parameters are consistent with abnormal left ventricular relaxation (grade 1 diastolic dysfunction). - Mitral valve: Valve area by pressure half-time: 1.67 cm^2.  Impressions:  - Normal LV systolic function; mild diastolic dysfunction; mild LVH; trace MR and TR.   ASSESSMENT & PLAN:    1. CAD - Cath as above. No angina or dyspnea. Continue ASA, Plavix, BB and statin.   2. HLD -09/27/2017: Cholesterol 145; HDL 27; LDL Cholesterol UNABLE TO CALCULATE IF TRIGLYCERIDE OVER 400 mg/dL; Triglycerides 542; VLDL UNABLE TO CALCULATE IF TRIGLYCERIDE OVER 400 mg/dL  - Patient says that above lab was non fasting in hospital. Continue fish oil and low dose crestor. Recheck lab during follow up. If still elevated consider lipid clinic referral.   3. HTN - BP of 140/82. At home 130/80s. Continue  Bisoprolol 10mg  qd and Lisinopril 20mg  qd.   4. Acute diastolic CHF - Noted LE edema with weight gain. No dyspnea, orthopnea  or pnd. Likely due to venous statis. Compliant with low sodium diet. Advised compression stocking and leg elevation. He will take lasix 20mg  qd for 1 week and then PRN with daily weight.   5. DM  - A1c 6.1. On metformin.     Medication Adjustments/Labs and Tests Ordered: Current medicines are reviewed at length with the patient today.  Concerns regarding medicines are outlined above.  Medication changes, Labs and Tests ordered today are listed in the Patient Instructions below. Patient Instructions  Your physician recommends that you continue on your current medications as directed. Please refer to the Current Medication list given to you today. TAKE FUROSEMIDE 20 MG EVERY DAY FOR 1 WEEK THEN AS NEEDED   Your physician recommends that you schedule a follow-up appointment in: 4 MONTHS WITH DR Alan Ripper, Manson Passey, PA  10/11/2017 10:12 AM    [facility] [address] [place], [place], [place]  [address] Phone: [phone]; Fax: [phone]

## 2017-10-11 ENCOUNTER — Encounter: Payer: Self-pay | Admitting: Physician Assistant

## 2017-10-11 ENCOUNTER — Ambulatory Visit (INDEPENDENT_AMBULATORY_CARE_PROVIDER_SITE_OTHER): Payer: PPO | Admitting: Physician Assistant

## 2017-10-11 VITALS — BP 140/82 | HR 77 | Ht 71.5 in | Wt 281.0 lb

## 2017-10-11 DIAGNOSIS — Z955 Presence of coronary angioplasty implant and graft: Secondary | ICD-10-CM | POA: Diagnosis not present

## 2017-10-11 DIAGNOSIS — I25110 Atherosclerotic heart disease of native coronary artery with unstable angina pectoris: Secondary | ICD-10-CM | POA: Diagnosis not present

## 2017-10-11 DIAGNOSIS — I5031 Acute diastolic (congestive) heart failure: Secondary | ICD-10-CM | POA: Diagnosis not present

## 2017-10-11 DIAGNOSIS — I2510 Atherosclerotic heart disease of native coronary artery without angina pectoris: Secondary | ICD-10-CM

## 2017-10-11 DIAGNOSIS — E785 Hyperlipidemia, unspecified: Secondary | ICD-10-CM | POA: Diagnosis not present

## 2017-10-11 DIAGNOSIS — I255 Ischemic cardiomyopathy: Secondary | ICD-10-CM

## 2017-10-11 DIAGNOSIS — I10 Essential (primary) hypertension: Secondary | ICD-10-CM

## 2017-10-11 MED ORDER — FUROSEMIDE 20 MG PO TABS: 20.0000 mg | ORAL_TABLET | Freq: Every day | ORAL | 6 refills | Status: DC | PRN

## 2017-10-11 NOTE — Patient Instructions (Signed)
Your physician recommends that you continue on your current medications as directed. Please refer to the Current Medication list given to you today. TAKE FUROSEMIDE 20 MG EVERY DAY FOR 1 WEEK THEN AS NEEDED   Your physician recommends that you schedule a follow-up appointment in: 4 MONTHS WITH DR Delton SeeNELSON

## 2017-10-12 ENCOUNTER — Telehealth (HOSPITAL_COMMUNITY): Payer: Self-pay

## 2017-10-12 NOTE — Telephone Encounter (Signed)
Patients insurance is active and benefits verified through [employer] - $15.00 co-pay, no deductible, out of pocket amount of $3,400/$250.92 has been met, no co-insurance, and no pre-authorization is required. Reference [phone]  Will contact patient to see if he is interested in the Cardiac Rehab Program. If interested, patient will need to complete follow up appt. Once competed, patient will be contacted for scheduling upon review by the RN Navigator.

## 2017-10-12 NOTE — Telephone Encounter (Signed)
Called patient to see if he is interested in the Cardiac Rehab Program. Patient stated since we are scheduling so far out he will already be good to do things on his own. He stated he is getting a Systems analystpersonal trainer. Closed referral.

## 2017-10-20 DIAGNOSIS — I25110 Atherosclerotic heart disease of native coronary artery with unstable angina pectoris: Secondary | ICD-10-CM | POA: Diagnosis not present

## 2017-10-20 DIAGNOSIS — M5441 Lumbago with sciatica, right side: Secondary | ICD-10-CM | POA: Diagnosis not present

## 2017-10-20 DIAGNOSIS — G8929 Other chronic pain: Secondary | ICD-10-CM | POA: Diagnosis not present

## 2017-10-20 DIAGNOSIS — G5702 Lesion of sciatic nerve, left lower limb: Secondary | ICD-10-CM | POA: Diagnosis not present

## 2017-11-08 DIAGNOSIS — R062 Wheezing: Secondary | ICD-10-CM | POA: Diagnosis not present

## 2017-11-08 DIAGNOSIS — H1045 Other chronic allergic conjunctivitis: Secondary | ICD-10-CM | POA: Diagnosis not present

## 2017-11-08 DIAGNOSIS — J3089 Other allergic rhinitis: Secondary | ICD-10-CM | POA: Diagnosis not present

## 2017-11-08 DIAGNOSIS — K219 Gastro-esophageal reflux disease without esophagitis: Secondary | ICD-10-CM | POA: Diagnosis not present

## 2017-11-26 DIAGNOSIS — Z6838 Body mass index (BMI) 38.0-38.9, adult: Secondary | ICD-10-CM | POA: Diagnosis not present

## 2017-11-26 DIAGNOSIS — E669 Obesity, unspecified: Secondary | ICD-10-CM | POA: Diagnosis not present

## 2017-12-02 DIAGNOSIS — R7303 Prediabetes: Secondary | ICD-10-CM | POA: Diagnosis not present

## 2017-12-02 DIAGNOSIS — E786 Lipoprotein deficiency: Secondary | ICD-10-CM | POA: Diagnosis not present

## 2017-12-02 DIAGNOSIS — I2510 Atherosclerotic heart disease of native coronary artery without angina pectoris: Secondary | ICD-10-CM | POA: Diagnosis not present

## 2017-12-02 DIAGNOSIS — M545 Low back pain: Secondary | ICD-10-CM | POA: Diagnosis not present

## 2017-12-02 DIAGNOSIS — I10 Essential (primary) hypertension: Secondary | ICD-10-CM | POA: Diagnosis not present

## 2017-12-02 DIAGNOSIS — E669 Obesity, unspecified: Secondary | ICD-10-CM | POA: Diagnosis not present

## 2017-12-02 DIAGNOSIS — Z955 Presence of coronary angioplasty implant and graft: Secondary | ICD-10-CM | POA: Diagnosis not present

## 2017-12-02 DIAGNOSIS — Z6838 Body mass index (BMI) 38.0-38.9, adult: Secondary | ICD-10-CM | POA: Diagnosis not present

## 2017-12-02 DIAGNOSIS — M25569 Pain in unspecified knee: Secondary | ICD-10-CM | POA: Diagnosis not present

## 2017-12-02 DIAGNOSIS — E781 Pure hyperglyceridemia: Secondary | ICD-10-CM | POA: Diagnosis not present

## 2017-12-16 DIAGNOSIS — Z713 Dietary counseling and surveillance: Secondary | ICD-10-CM | POA: Diagnosis not present

## 2017-12-16 DIAGNOSIS — Z6837 Body mass index (BMI) 37.0-37.9, adult: Secondary | ICD-10-CM | POA: Diagnosis not present

## 2017-12-16 DIAGNOSIS — E669 Obesity, unspecified: Secondary | ICD-10-CM | POA: Diagnosis not present

## 2017-12-17 DIAGNOSIS — L821 Other seborrheic keratosis: Secondary | ICD-10-CM | POA: Diagnosis not present

## 2017-12-17 DIAGNOSIS — L919 Hypertrophic disorder of the skin, unspecified: Secondary | ICD-10-CM | POA: Diagnosis not present

## 2017-12-17 DIAGNOSIS — L738 Other specified follicular disorders: Secondary | ICD-10-CM | POA: Diagnosis not present

## 2017-12-22 DIAGNOSIS — I10 Essential (primary) hypertension: Secondary | ICD-10-CM | POA: Diagnosis not present

## 2017-12-22 DIAGNOSIS — Z955 Presence of coronary angioplasty implant and graft: Secondary | ICD-10-CM | POA: Diagnosis not present

## 2017-12-22 DIAGNOSIS — E669 Obesity, unspecified: Secondary | ICD-10-CM | POA: Diagnosis not present

## 2017-12-22 DIAGNOSIS — Z6837 Body mass index (BMI) 37.0-37.9, adult: Secondary | ICD-10-CM | POA: Diagnosis not present

## 2017-12-22 DIAGNOSIS — Z713 Dietary counseling and surveillance: Secondary | ICD-10-CM | POA: Diagnosis not present

## 2017-12-22 DIAGNOSIS — E781 Pure hyperglyceridemia: Secondary | ICD-10-CM | POA: Diagnosis not present

## 2018-01-10 DIAGNOSIS — Z6837 Body mass index (BMI) 37.0-37.9, adult: Secondary | ICD-10-CM | POA: Diagnosis not present

## 2018-01-10 DIAGNOSIS — E785 Hyperlipidemia, unspecified: Secondary | ICD-10-CM | POA: Diagnosis not present

## 2018-01-10 DIAGNOSIS — Z23 Encounter for immunization: Secondary | ICD-10-CM | POA: Diagnosis not present

## 2018-01-10 DIAGNOSIS — Z7984 Long term (current) use of oral hypoglycemic drugs: Secondary | ICD-10-CM | POA: Diagnosis not present

## 2018-01-10 DIAGNOSIS — E1169 Type 2 diabetes mellitus with other specified complication: Secondary | ICD-10-CM | POA: Diagnosis not present

## 2018-01-10 DIAGNOSIS — I2510 Atherosclerotic heart disease of native coronary artery without angina pectoris: Secondary | ICD-10-CM | POA: Diagnosis not present

## 2018-01-10 DIAGNOSIS — I10 Essential (primary) hypertension: Secondary | ICD-10-CM | POA: Diagnosis not present

## 2018-02-02 DIAGNOSIS — R972 Elevated prostate specific antigen [PSA]: Secondary | ICD-10-CM | POA: Diagnosis not present

## 2018-02-03 DIAGNOSIS — E669 Obesity, unspecified: Secondary | ICD-10-CM | POA: Diagnosis not present

## 2018-02-03 DIAGNOSIS — Z6836 Body mass index (BMI) 36.0-36.9, adult: Secondary | ICD-10-CM | POA: Diagnosis not present

## 2018-02-03 DIAGNOSIS — Z713 Dietary counseling and surveillance: Secondary | ICD-10-CM | POA: Diagnosis not present

## 2018-02-07 DIAGNOSIS — N5201 Erectile dysfunction due to arterial insufficiency: Secondary | ICD-10-CM | POA: Diagnosis not present

## 2018-02-07 DIAGNOSIS — R972 Elevated prostate specific antigen [PSA]: Secondary | ICD-10-CM | POA: Diagnosis not present

## 2018-02-09 DIAGNOSIS — M9904 Segmental and somatic dysfunction of sacral region: Secondary | ICD-10-CM | POA: Diagnosis not present

## 2018-02-09 DIAGNOSIS — M9905 Segmental and somatic dysfunction of pelvic region: Secondary | ICD-10-CM | POA: Diagnosis not present

## 2018-02-09 DIAGNOSIS — M47816 Spondylosis without myelopathy or radiculopathy, lumbar region: Secondary | ICD-10-CM | POA: Diagnosis not present

## 2018-02-09 DIAGNOSIS — M9906 Segmental and somatic dysfunction of lower extremity: Secondary | ICD-10-CM | POA: Diagnosis not present

## 2018-02-09 DIAGNOSIS — I10 Essential (primary) hypertension: Secondary | ICD-10-CM | POA: Diagnosis not present

## 2018-02-09 DIAGNOSIS — G5702 Lesion of sciatic nerve, left lower limb: Secondary | ICD-10-CM | POA: Diagnosis not present

## 2018-02-23 DIAGNOSIS — S86012A Strain of left Achilles tendon, initial encounter: Secondary | ICD-10-CM | POA: Diagnosis not present

## 2018-02-24 DIAGNOSIS — E669 Obesity, unspecified: Secondary | ICD-10-CM | POA: Diagnosis not present

## 2018-02-24 DIAGNOSIS — Z713 Dietary counseling and surveillance: Secondary | ICD-10-CM | POA: Diagnosis not present

## 2018-02-24 DIAGNOSIS — I10 Essential (primary) hypertension: Secondary | ICD-10-CM | POA: Diagnosis not present

## 2018-02-24 DIAGNOSIS — Z955 Presence of coronary angioplasty implant and graft: Secondary | ICD-10-CM | POA: Diagnosis not present

## 2018-02-24 DIAGNOSIS — E781 Pure hyperglyceridemia: Secondary | ICD-10-CM | POA: Diagnosis not present

## 2018-02-24 DIAGNOSIS — R7303 Prediabetes: Secondary | ICD-10-CM | POA: Diagnosis not present

## 2018-02-24 DIAGNOSIS — R635 Abnormal weight gain: Secondary | ICD-10-CM | POA: Diagnosis not present

## 2018-03-07 DIAGNOSIS — M25572 Pain in left ankle and joints of left foot: Secondary | ICD-10-CM | POA: Diagnosis not present

## 2018-03-07 DIAGNOSIS — Z713 Dietary counseling and surveillance: Secondary | ICD-10-CM | POA: Diagnosis not present

## 2018-03-09 DIAGNOSIS — S86012D Strain of left Achilles tendon, subsequent encounter: Secondary | ICD-10-CM | POA: Diagnosis not present

## 2018-03-17 ENCOUNTER — Telehealth: Payer: Self-pay | Admitting: Cardiology

## 2018-03-17 NOTE — Telephone Encounter (Signed)
New Message   Pt calling, states he has 2 appts, one on 12/17 and one in feb. Pt wants to know if he needs both appts. Please call

## 2018-03-17 NOTE — Telephone Encounter (Signed)
Spoke with the pt and informed him that NO, he does not need both follow-up appts with Dr Delton SeeNelson.  Informed the pt that I will cancel his 06/10/2018 appt with Dr Delton SeeNelson, and we will see him as planned for 04/19/18 at 0800 with Dr Delton SeeNelson.  Pt verbalized understanding and agrees with this plan. Pt gracious for all the assistance provided.

## 2018-03-21 ENCOUNTER — Other Ambulatory Visit: Payer: Self-pay | Admitting: Cardiology

## 2018-03-21 DIAGNOSIS — S86012D Strain of left Achilles tendon, subsequent encounter: Secondary | ICD-10-CM | POA: Diagnosis not present

## 2018-04-04 DIAGNOSIS — S86012D Strain of left Achilles tendon, subsequent encounter: Secondary | ICD-10-CM | POA: Diagnosis not present

## 2018-04-19 ENCOUNTER — Encounter: Payer: Self-pay | Admitting: Cardiology

## 2018-04-19 ENCOUNTER — Ambulatory Visit: Payer: PPO | Admitting: Cardiology

## 2018-04-19 VITALS — BP 146/84 | HR 78 | Ht 71.5 in | Wt 248.4 lb

## 2018-04-19 DIAGNOSIS — I2510 Atherosclerotic heart disease of native coronary artery without angina pectoris: Secondary | ICD-10-CM | POA: Diagnosis not present

## 2018-04-19 DIAGNOSIS — E785 Hyperlipidemia, unspecified: Secondary | ICD-10-CM | POA: Diagnosis not present

## 2018-04-19 DIAGNOSIS — I10 Essential (primary) hypertension: Secondary | ICD-10-CM

## 2018-04-19 LAB — COMPREHENSIVE METABOLIC PANEL
ALT: 20 IU/L (ref 0–44)
AST: 20 IU/L (ref 0–40)
Albumin/Globulin Ratio: 1.8 (ref 1.2–2.2)
Albumin: 4.5 g/dL (ref 3.5–4.8)
Alkaline Phosphatase: 73 IU/L (ref 39–117)
BUN/Creatinine Ratio: 20 (ref 10–24)
BUN: 14 mg/dL (ref 8–27)
Bilirubin Total: 0.3 mg/dL (ref 0.0–1.2)
CO2: 22 mmol/L (ref 20–29)
Calcium: 9.5 mg/dL (ref 8.6–10.2)
Chloride: 105 mmol/L (ref 96–106)
Creatinine, Ser: 0.71 mg/dL — ABNORMAL LOW (ref 0.76–1.27)
GFR calc Af Amer: 109 mL/min/{1.73_m2} (ref >59)
GFR calc non Af Amer: 94 mL/min/{1.73_m2} (ref >59)
Globulin, Total: 2.5 g/dL (ref 1.5–4.5)
Glucose: 96 mg/dL (ref 65–99)
Potassium: 4.9 mmol/L (ref 3.5–5.2)
Sodium: 143 mmol/L (ref 134–144)
Total Protein: 7 g/dL (ref 6.0–8.5)

## 2018-04-19 LAB — CBC WITH DIFFERENTIAL/PLATELET
Basophils Absolute: 0.1 10*3/uL (ref 0.0–0.2)
Basos: 1 %
EOS (ABSOLUTE): 0.2 10*3/uL (ref 0.0–0.4)
Eos: 3 %
Hematocrit: 37.8 % (ref 37.5–51.0)
Hemoglobin: 12.4 g/dL — ABNORMAL LOW (ref 13.0–17.7)
Immature Grans (Abs): 0 10*3/uL (ref 0.0–0.1)
Immature Granulocytes: 1 %
Lymphocytes Absolute: 1.2 10*3/uL (ref 0.7–3.1)
Lymphs: 20 %
MCH: 26.8 pg (ref 26.6–33.0)
MCHC: 32.8 g/dL (ref 31.5–35.7)
MCV: 82 fL (ref 79–97)
Monocytes Absolute: 0.6 10*3/uL (ref 0.1–0.9)
Monocytes: 9 %
Neutrophils Absolute: 4.1 10*3/uL (ref 1.4–7.0)
Neutrophils: 66 %
Platelets: 251 10*3/uL (ref 150–450)
RBC: 4.63 x10E6/uL (ref 4.14–5.80)
RDW: 14.2 % (ref 12.3–15.4)
WBC: 6.2 10*3/uL (ref 3.4–10.8)

## 2018-04-19 LAB — LIPID PANEL
Chol/HDL Ratio: 4.1 ratio (ref 0.0–5.0)
Cholesterol, Total: 145 mg/dL (ref 100–199)
HDL: 35 mg/dL — ABNORMAL LOW (ref >39)
LDL Calculated: 75 mg/dL (ref 0–99)
Triglycerides: 173 mg/dL — ABNORMAL HIGH (ref 0–149)
VLDL Cholesterol Cal: 35 mg/dL (ref 5–40)

## 2018-04-19 LAB — TSH: TSH: 1.7 u[IU]/mL (ref 0.450–4.500)

## 2018-04-19 NOTE — Progress Notes (Signed)
Cardiology Office Note    Date:  04/19/2018   ID:  Alex PrestoRobert W Piggott, DOB July 23, 1945, MRN [id number]  PCP:  Farris HasMorrow, Aaron, MD  Primary Cardiologist: Tobias AlexanderKatarina Joban Colledge, MD  Chief Complaint: Hospital follow up  History of Present Illness:   Alex Jackson is a 72 y.o. male with hx of CAD and prior NSTEMI with stent to LAD in 2016 and EF at that time 34-50% then subsequent normalization, HTN, HLD presents for follow up.   Admited 5/26-5/29/19 with NSTEMI. Peak of troponin 0.40. Treated with heparin. Cath showed Dist RCA lesion is 90% stenosed with 99% stenosed side branch in Post Atrio. S/p successful PCI with stenting of the distal RCA into the PLOM with a DES. Balloon angioplasty of the PDA through the stent struts. Echo with LVEF of 55-60% with grade 1 DD.   Here today for follow up. His only complain is bilateral LE edema. Compliant with low sodium diet. Does not check his weight. He as gained 2 lb since discharge and 11 lb since OV in 06/2017. No chest pain, SOB, orthopnea, PNd, dizziness, melena or syncope. Compliant with medications.   04/19/2018 -this is six-month follow-up, the patient feels great from cardiac standpoint, he was able to lose 30 pounds, he denies any chest pain shortness of breath no palpitations no lower extremity edema, tolerating all his medications well.  He injured his foot a year ago and was just diagnosed with Achilles rupture and is currently in a cast.  Unable to walk because of that.  Past Medical History:  Diagnosis Date  . CAD in native artery 03/01/2015   1. cath - DES to LAD residual non obstructive RCA disease in 2016 b. Cath s/p Successful PCI with stenting of the distal RCA into the PLOM with a DES. Balloon angioplasty of the PDA through the stent struts.   . Hyperlipidemia   . Hypertension     Past Surgical History:  Procedure Laterality Date  . CARDIAC CATHETERIZATION N/A 03/01/2015   Procedure: Left Heart Cath and Coronary Angiography;   Surgeon: Kathleene Hazelhristopher D McAlhany, MD;  [facility];  Service: Cardiovascular;  Laterality: N/A;  . CORONARY STENT INTERVENTION N/A 09/28/2017   Procedure: CORONARY STENT INTERVENTION;  Surgeon: SwazilandJordan, Peter M, MD;  [facility];  Service: Cardiovascular;  Laterality: N/A;  . LEFT HEART CATH AND CORONARY ANGIOGRAPHY N/A 09/28/2017   Procedure: LEFT HEART CATH AND CORONARY ANGIOGRAPHY;  Surgeon: [place]azilandJordan, Peter M, MD;  [facility];  Service: Cardiovascular;  Laterality: N/A;  . NO PAST SURGERIES      Current Medications: Prior to Admission medications   Medication Sig Start Date End Date Taking? Authorizing Provider  acetaminophen (TYLENOL) 325 MG tablet Take 2 tablets (650 mg total) by mouth every 4 (four) hours as needed for headache or mild pain. 09/29/17   Leone BrandIngold, Laura R, NP  aspirin EC 81 MG tablet Take 81 mg by mouth daily.    [provider]  bisoprolol (ZEBETA) 10 MG tablet Take 1 tablet (10 mg total) by mouth daily. 06/07/17   Lars MassonNelson, Taneesha Edgin H, MD  clopidogrel (PLAVIX) 75 MG tablet Take 1 tablet (75 mg total) by mouth daily. 06/07/17   Lars MassonNelson, Gryffin Altice H, MD  fluticasone (FLONASE) 50 MCG/ACT nasal spray USE 1 SPRAY IN EACH NOSTRIL EVERY DAY 01/11/15   [provider]  furosemide (LASIX) 20 MG tablet Take 1 tablet (20 mg total) by mouth daily as needed. swelling 06/07/17  Lars Masson, MD  hydroxypropyl methylcellulose / hypromellose (ISOPTO TEARS / GONIOVISC) 2.5 % ophthalmic solution Place 1 drop into both eyes as needed for dry eyes.    [provider]  lidocaine (LIDODERM) 5 % Place 1 patch onto the skin daily. Remove & Discard patch within 12 hours or as directed by MD    [provider]  lisinopril (PRINIVIL,ZESTRIL) 20 MG tablet Take 1 tablet (20 mg total) by mouth daily. 09/30/17   Leone Brand, NP  Menthol (ICY HOT) 5 % PTCH Apply 1 patch topically as needed (back pain).    [provider]   metFORMIN (GLUCOPHAGE) 500 MG tablet Take 1 tablet (500 mg total) by mouth 2 (two) times daily. 10/01/17   Leone Brand, NP  montelukast (SINGULAIR) 10 MG tablet Take 10 mg by mouth daily. 02/04/17   [provider]  Multiple Vitamin (MULTIVITAMIN WITH MINERALS) TABS tablet Take 1 tablet by mouth daily.    [provider]  nitroGLYCERIN (NITROSTAT) 0.4 MG SL tablet Place 1 tablet (0.4 mg total) under the tongue every 5 (five) minutes x 3 doses as needed for chest pain. 09/29/17   Leone Brand, NP  omega-3 acid ethyl esters (LOVAZA) 1 g capsule Take 1 capsule (1 g total) by mouth 2 (two) times daily. 09/29/17   Leone Brand, NP  pantoprazole (PROTONIX) 40 MG tablet Take 1 tablet (40 mg total) by mouth daily. 09/30/17   Leone Brand, NP  rosuvastatin (CRESTOR) 5 MG tablet TAKE 1 TABLET BY MOUTH EVERY DAY 06/07/17   Lars Masson, MD    Allergies:   Atorvastatin; Prednisone; and Pravastatin   Social History   Socioeconomic History  . Marital status: Divorced    Spouse name: Not on file  . Number of children: Not on file  . Years of education: Not on file  . Highest education level: Not on file  Occupational History  . Not on file  Social Needs  . Financial resource strain: Not on file  . Food insecurity:    Worry: Not on file    Inability: Not on file  . Transportation needs:    Medical: Not on file    Non-medical: Not on file  Tobacco Use  . Smoking status: Former Smoker    Packs/day: 1.00    Last attempt to quit: 07/03/1983    Years since quitting: 34.8  . Smokeless tobacco: Never Used  . Tobacco comment: quit in Mar 1985  Substance and Sexual Activity  . Alcohol use: Yes    Alcohol/week: 0.0 standard drinks    Comment: "one or two drinks a night"  . Drug use: No  . Sexual activity: Not on file  Lifestyle  . Physical activity:    Days per week: Not on file    Minutes per session: Not on file  . Stress: Not on file  Relationships  . Social  connections:    Talks on phone: Not on file    Gets together: Not on file    Attends religious service: Not on file    Active member of club or organization: Not on file    Attends meetings of clubs or organizations: Not on file    Relationship status: Not on file  Other Topics Concern  . Not on file  Social History Narrative  . Not on file     Family History:  The patient's family history includes Arrhythmia in his sister; Liver cancer in  his father; Thyroid cancer in his mother.   ROS:   Please see the history of present illness.    ROS All other systems reviewed and are negative.   PHYSICAL EXAM:   VS:  BP (!) 146/84   Pulse 78   Ht 5' 11.5" (1.816 m)   Wt 248 lb 6.4 oz (112.7 kg)   SpO2 90%   BMI 34.16 kg/m    GEN: Well nourished, well developed, in no acute distress  HEENT: normal  Neck: no JVD, carotid bruits, or masses Cardiac: RRR; no murmurs, rubs, or gallops, no edema , left lower extremity in a cast Respiratory:  clear to auscultation bilaterally, normal work of breathing GI: soft, nontender, nondistended, + BS MS: no deformity or atrophy  Skin: warm and dry, no rash Neuro:  Alert and Oriented x 3, Strength and sensation are intact Psych: euthymic mood, full affect  Wt Readings from Last 3 Encounters:  04/19/18 248 lb 6.4 oz (112.7 kg)  10/11/17 281 lb (127.5 kg)  09/26/17 279 lb 11.2 oz (126.9 kg)      Studies/Labs Reviewed:   EKG:  EKG is ordered today.  The ekg ordered today demonstrates NSR  Recent Labs: 09/26/2017: B Natriuretic Peptide 73.1 09/29/2017: BUN 12; Creatinine, Ser 0.81; Hemoglobin 10.7; Platelets 223; Potassium 4.1; Sodium 139   Lipid Panel    Component Value Date/Time   CHOL 145 09/27/2017 0222   CHOL 137 01/01/2017 0829   TRIG 542 (H) 09/27/2017 0222   HDL 27 (L) 09/27/2017 0222   HDL 35 (L) 01/01/2017 0829   CHOLHDL 5.4 09/27/2017 0222   VLDL UNABLE TO CALCULATE IF TRIGLYCERIDE OVER 400 mg/dL 16/02/9603 5409   LDLCALC  UNABLE TO CALCULATE IF TRIGLYCERIDE OVER 400 mg/dL 81/19/1478 2956   LDLCALC 76 01/01/2017 0829    Additional studies/ records that were reviewed today include:   Cardiac cath 09/28/2017 Conclusion     Previously placed Prox LAD drug eluting stent is widely patent.  Mid RCA to Dist RCA lesion is 30% stenosed.  RPDA lesion is 20% stenosed.  Dist RCA lesion is 90% stenosed with 99% stenosed side branch in Post Atrio.  A drug-eluting stent was successfully placed using a STENT SYNERGY DES 3.5X16.  Post intervention, there is a 0% residual stenosis.  Post intervention, the side branch was reduced to 0% residual stenosis.  Ost RPDA lesion is 60% stenosed.  Balloon angioplasty was performed.  Post intervention, there is a 0% residual stenosis.  LV end diastolic pressure is normal.  1. Single vessel obstructive CAD  2. Patent stent in the LAD 3. Normal LVEDP 4. Successful PCI with stenting of the distal RCA into the PLOM with a DES. Balloon angioplasty of the PDA through the stent struts.   Plan: DAPT for one year. Risk factor modification. Anticipate DC in am.    Echo 09/27/17 Study Conclusions  - Left ventricle: The cavity size was normal. Wall thickness was increased in a pattern of mild LVH. Systolic function was normal. The estimated ejection fraction was in the range of 55% to 60%. Wall motion was normal; there were no regional wall motion abnormalities. Doppler parameters are consistent with abnormal left ventricular relaxation (grade 1 diastolic dysfunction). - Mitral valve: Valve area by pressure half-time: 1.67 cm^2.  Impressions:  - Normal LV systolic function; mild diastolic dysfunction; mild LVH; trace MR and TR.   ASSESSMENT & PLAN:    1. CAD - Cath as above. No angina or dyspnea. Continue ASA,  Plavix, BB and statin.  He is asymptomatic.  His EKG today shows normal sinus rhythm normal EKG unchanged from prior this was personally  reviewed.  2. HLD Tolerating Crestor well, we will continue Crestor and Lovaza, recheck lipids today.  3. HTN He has not taken his medications today yet but usually normal at home.  4.  Chronic diastolic CHF The patient is well compensated, will continue Lasix 20 mg daily.  5. DM  - A1c 6.1. On metformin.   Medication Adjustments/Labs and Tests Ordered: Current medicines are reviewed at length with the patient today.  Concerns regarding medicines are outlined above.  Medication changes, Labs and Tests ordered today are listed in the Patient Instructions below. Patient Instructions  Medication Instructions:   Your physician recommends that you continue on your current medications as directed. Please refer to the Current Medication list given to you today.  If you need a refill on your cardiac medications before your next appointment, please call your pharmacy.     Lab work:  TODAY--CMET, CBC W DIFF, TSH, AND LIPIDS  If you have labs (blood work) drawn today and your tests are completely normal, you will receive your results only by: Marland Kitchen MyChart Message (if you have MyChart) OR . A paper copy in the mail If you have any lab test that is abnormal or we need to change your treatment, we will call you to review the results.     Follow-Up: At [facility], you and your health needs are our priority.  As part of our continuing mission to provide you with exceptional heart care, we have created designated Provider Care Teams.  These Care Teams include your primary Cardiologist (physician) and Advanced Practice Providers (APPs -  Physician Assistants and Nurse Practitioners) who all work together to provide you with the care you need, when you need it. You will need a follow up appointment in 4 months with Dr. Delton See.  Please call our office 2 months in advance to schedule this appointment.  You may see Tobias Alexander, MD or one of the following Advanced Practice Providers on your  designated Care Team:   [place], PA-C Ronie Spies, PA-C . Jacolyn Reedy, PA-C        Signed, Tobias Alexander, MD  04/19/2018 9:02 AM    [facility] [address] [place], [place], [place]  [address] Phone: [phone]; Fax: [phone]

## 2018-04-19 NOTE — Patient Instructions (Signed)
Medication Instructions:   Your physician recommends that you continue on your current medications as directed. Please refer to the Current Medication list given to you today.  If you need a refill on your cardiac medications before your next appointment, please call your pharmacy.     Lab work:  TODAY--CMET, CBC W DIFF, TSH, AND LIPIDS  If you have labs (blood work) drawn today and your tests are completely normal, you will receive your results only by: Marland Kitchen. MyChart Message (if you have MyChart) OR . A paper copy in the mail If you have any lab test that is abnormal or we need to change your treatment, we will call you to review the results.     Follow-Up: At [facility], you and your health needs are our priority.  As part of our continuing mission to provide you with exceptional heart care, we have created designated Provider Care Teams.  These Care Teams include your primary Cardiologist (physician) and Advanced Practice Providers (APPs -  Physician Assistants and Nurse Practitioners) who all work together to provide you with the care you need, when you need it. You will need a follow up appointment in 4 months with Dr. Delton SeeNelson.  Please call our office 2 months in advance to schedule this appointment.  You may see Tobias AlexanderKatarina Nelson, MD or one of the following Advanced Practice Providers on your designated Care Team:   [place], PA-C Ronie Spiesayna Dunn, PA-C . Jacolyn ReedyMichele Lenze, PA-C

## 2018-04-21 DIAGNOSIS — S86012D Strain of left Achilles tendon, subsequent encounter: Secondary | ICD-10-CM | POA: Diagnosis not present

## 2018-05-03 DIAGNOSIS — Z7984 Long term (current) use of oral hypoglycemic drugs: Secondary | ICD-10-CM | POA: Diagnosis not present

## 2018-05-03 DIAGNOSIS — R972 Elevated prostate specific antigen [PSA]: Secondary | ICD-10-CM | POA: Diagnosis not present

## 2018-05-03 DIAGNOSIS — I10 Essential (primary) hypertension: Secondary | ICD-10-CM | POA: Diagnosis not present

## 2018-05-03 DIAGNOSIS — S86019A Strain of unspecified Achilles tendon, initial encounter: Secondary | ICD-10-CM | POA: Diagnosis not present

## 2018-05-03 DIAGNOSIS — E785 Hyperlipidemia, unspecified: Secondary | ICD-10-CM | POA: Diagnosis not present

## 2018-05-03 DIAGNOSIS — E1169 Type 2 diabetes mellitus with other specified complication: Secondary | ICD-10-CM | POA: Diagnosis not present

## 2018-05-03 DIAGNOSIS — Z0000 Encounter for general adult medical examination without abnormal findings: Secondary | ICD-10-CM | POA: Diagnosis not present

## 2018-05-05 DIAGNOSIS — G8929 Other chronic pain: Secondary | ICD-10-CM | POA: Diagnosis not present

## 2018-05-05 DIAGNOSIS — M25552 Pain in left hip: Secondary | ICD-10-CM | POA: Diagnosis not present

## 2018-05-05 DIAGNOSIS — R29898 Other symptoms and signs involving the musculoskeletal system: Secondary | ICD-10-CM | POA: Diagnosis not present

## 2018-05-05 DIAGNOSIS — M25672 Stiffness of left ankle, not elsewhere classified: Secondary | ICD-10-CM | POA: Diagnosis not present

## 2018-05-05 DIAGNOSIS — Z7409 Other reduced mobility: Secondary | ICD-10-CM | POA: Diagnosis not present

## 2018-05-05 DIAGNOSIS — M545 Low back pain: Secondary | ICD-10-CM | POA: Diagnosis not present

## 2018-05-10 ENCOUNTER — Other Ambulatory Visit (HOSPITAL_COMMUNITY): Payer: Self-pay | Admitting: Cardiology

## 2018-05-10 DIAGNOSIS — Z7409 Other reduced mobility: Secondary | ICD-10-CM | POA: Diagnosis not present

## 2018-05-10 DIAGNOSIS — R29898 Other symptoms and signs involving the musculoskeletal system: Secondary | ICD-10-CM | POA: Diagnosis not present

## 2018-05-10 DIAGNOSIS — M25672 Stiffness of left ankle, not elsewhere classified: Secondary | ICD-10-CM | POA: Diagnosis not present

## 2018-05-12 ENCOUNTER — Other Ambulatory Visit (HOSPITAL_COMMUNITY): Payer: Self-pay | Admitting: Cardiology

## 2018-05-12 DIAGNOSIS — M25552 Pain in left hip: Secondary | ICD-10-CM | POA: Diagnosis not present

## 2018-05-12 DIAGNOSIS — R29898 Other symptoms and signs involving the musculoskeletal system: Secondary | ICD-10-CM | POA: Diagnosis not present

## 2018-05-12 DIAGNOSIS — Z7409 Other reduced mobility: Secondary | ICD-10-CM | POA: Diagnosis not present

## 2018-05-12 DIAGNOSIS — M25672 Stiffness of left ankle, not elsewhere classified: Secondary | ICD-10-CM | POA: Diagnosis not present

## 2018-05-17 DIAGNOSIS — Z7409 Other reduced mobility: Secondary | ICD-10-CM | POA: Diagnosis not present

## 2018-05-17 DIAGNOSIS — R29898 Other symptoms and signs involving the musculoskeletal system: Secondary | ICD-10-CM | POA: Diagnosis not present

## 2018-05-17 DIAGNOSIS — M25552 Pain in left hip: Secondary | ICD-10-CM | POA: Diagnosis not present

## 2018-05-17 DIAGNOSIS — M25672 Stiffness of left ankle, not elsewhere classified: Secondary | ICD-10-CM | POA: Diagnosis not present

## 2018-05-25 DIAGNOSIS — M25672 Stiffness of left ankle, not elsewhere classified: Secondary | ICD-10-CM | POA: Diagnosis not present

## 2018-05-25 DIAGNOSIS — R29898 Other symptoms and signs involving the musculoskeletal system: Secondary | ICD-10-CM | POA: Diagnosis not present

## 2018-05-25 DIAGNOSIS — M25552 Pain in left hip: Secondary | ICD-10-CM | POA: Diagnosis not present

## 2018-05-25 DIAGNOSIS — Z7409 Other reduced mobility: Secondary | ICD-10-CM | POA: Diagnosis not present

## 2018-05-31 DIAGNOSIS — Z7409 Other reduced mobility: Secondary | ICD-10-CM | POA: Diagnosis not present

## 2018-05-31 DIAGNOSIS — M25672 Stiffness of left ankle, not elsewhere classified: Secondary | ICD-10-CM | POA: Diagnosis not present

## 2018-05-31 DIAGNOSIS — R29898 Other symptoms and signs involving the musculoskeletal system: Secondary | ICD-10-CM | POA: Diagnosis not present

## 2018-06-02 DIAGNOSIS — M25672 Stiffness of left ankle, not elsewhere classified: Secondary | ICD-10-CM | POA: Diagnosis not present

## 2018-06-02 DIAGNOSIS — R29898 Other symptoms and signs involving the musculoskeletal system: Secondary | ICD-10-CM | POA: Diagnosis not present

## 2018-06-02 DIAGNOSIS — S86012D Strain of left Achilles tendon, subsequent encounter: Secondary | ICD-10-CM | POA: Diagnosis not present

## 2018-06-02 DIAGNOSIS — Z7409 Other reduced mobility: Secondary | ICD-10-CM | POA: Diagnosis not present

## 2018-06-10 ENCOUNTER — Ambulatory Visit: Payer: PPO | Admitting: Cardiology

## 2018-06-12 ENCOUNTER — Other Ambulatory Visit: Payer: Self-pay | Admitting: Cardiology

## 2018-06-12 DIAGNOSIS — Z955 Presence of coronary angioplasty implant and graft: Secondary | ICD-10-CM

## 2018-06-12 DIAGNOSIS — I10 Essential (primary) hypertension: Secondary | ICD-10-CM

## 2018-06-12 DIAGNOSIS — I2510 Atherosclerotic heart disease of native coronary artery without angina pectoris: Secondary | ICD-10-CM

## 2018-06-12 DIAGNOSIS — I255 Ischemic cardiomyopathy: Secondary | ICD-10-CM

## 2018-06-12 DIAGNOSIS — E785 Hyperlipidemia, unspecified: Secondary | ICD-10-CM

## 2018-06-12 DIAGNOSIS — I25110 Atherosclerotic heart disease of native coronary artery with unstable angina pectoris: Secondary | ICD-10-CM

## 2018-06-21 ENCOUNTER — Other Ambulatory Visit (HOSPITAL_COMMUNITY): Payer: Self-pay | Admitting: Cardiology

## 2018-06-23 DIAGNOSIS — I10 Essential (primary) hypertension: Secondary | ICD-10-CM | POA: Diagnosis not present

## 2018-06-28 ENCOUNTER — Other Ambulatory Visit: Payer: Self-pay | Admitting: Cardiology

## 2018-06-28 DIAGNOSIS — I25110 Atherosclerotic heart disease of native coronary artery with unstable angina pectoris: Secondary | ICD-10-CM

## 2018-06-28 DIAGNOSIS — I2510 Atherosclerotic heart disease of native coronary artery without angina pectoris: Secondary | ICD-10-CM

## 2018-06-28 DIAGNOSIS — I10 Essential (primary) hypertension: Secondary | ICD-10-CM

## 2018-06-28 DIAGNOSIS — Z955 Presence of coronary angioplasty implant and graft: Secondary | ICD-10-CM

## 2018-06-28 DIAGNOSIS — E785 Hyperlipidemia, unspecified: Secondary | ICD-10-CM

## 2018-06-28 DIAGNOSIS — I255 Ischemic cardiomyopathy: Secondary | ICD-10-CM

## 2018-07-06 DIAGNOSIS — M545 Low back pain: Secondary | ICD-10-CM | POA: Diagnosis not present

## 2018-07-06 DIAGNOSIS — M25552 Pain in left hip: Secondary | ICD-10-CM | POA: Diagnosis not present

## 2018-07-08 ENCOUNTER — Telehealth: Payer: Self-pay | Admitting: Cardiology

## 2018-07-08 NOTE — Telephone Encounter (Signed)
Notified the pt that per Dr Delton See, she recommends that he continue to take the medication and let us know if he observes any worsening memory problems.  Pt verbalized understanding and agrees with this plan.

## 2018-07-08 NOTE — Telephone Encounter (Signed)
Pt is calling in to make his follow-up appt, per recall.  Scheduled the pt an appt with Boyce Medici PA-C, on [facility] care team, for 07/28/18 at 0930.  Pt aware to arrive 15 mins prior to that appt.   Pt also states that his insurance company sent him a "warning message" on medication that we prescribed pantoprazole.  Pt states that the letter states that this med can cause memory issues.  Pt would like for me to run this by Dr Delton See or our Pharmacist, to get their input on this.  Informed the pt that I will route this message to them, and follow-up with their recommendations thereafter.  Pt verbalized understanding and agrees with this plan.

## 2018-07-08 NOTE — Telephone Encounter (Signed)
I would continue to take the medication unless he observes any worsening memory problems.

## 2018-07-08 NOTE — Telephone Encounter (Signed)
New Message:   Patient calling stating that he has call a few to make a appt, but was not able to get one,because I could not fine anything. Also patient states he stated a medication name Omeprazole. His insurance told him that this medication will do something to his mind. Please call patient.

## 2018-07-18 DIAGNOSIS — M5416 Radiculopathy, lumbar region: Secondary | ICD-10-CM | POA: Diagnosis not present

## 2018-07-18 DIAGNOSIS — M545 Low back pain: Secondary | ICD-10-CM | POA: Diagnosis not present

## 2018-07-20 ENCOUNTER — Encounter: Payer: Self-pay | Admitting: Cardiology

## 2018-07-21 ENCOUNTER — Telehealth: Payer: Self-pay

## 2018-07-21 NOTE — Telephone Encounter (Signed)
-----   Message from [place], [place] sent at 07/19/2018  4:01 PM EDT ----- Regarding: reschedule If no active cardiac symptoms, ok to reschedule appt but would prefer to see back sooner, in 4-6 weeks.

## 2018-07-21 NOTE — Telephone Encounter (Signed)
Pt contacted. History reviewed. No symptoms at this time reported by pt. Pt agreeable to postpone appt d/t COVID19. Pt advised to call back if any new symptoms present. Pt verbalized understanding. Will route to COVID19 cancel pool.

## 2018-07-28 ENCOUNTER — Ambulatory Visit: Payer: PPO | Admitting: Cardiology

## 2018-07-28 DIAGNOSIS — M5416 Radiculopathy, lumbar region: Secondary | ICD-10-CM | POA: Diagnosis not present

## 2018-08-03 ENCOUNTER — Other Ambulatory Visit: Payer: Self-pay | Admitting: Cardiology

## 2018-08-03 DIAGNOSIS — I25110 Atherosclerotic heart disease of native coronary artery with unstable angina pectoris: Secondary | ICD-10-CM

## 2018-08-03 DIAGNOSIS — E785 Hyperlipidemia, unspecified: Secondary | ICD-10-CM

## 2018-08-08 NOTE — Telephone Encounter (Signed)
I can see this patient as an evisit-doximity if possible

## 2018-08-08 NOTE — Telephone Encounter (Signed)
Virtual Visit Pre-Appointment Phone Call   TELEPHONE CALL NOTE  Alex Jackson has been deemed a candidate for a follow-up tele-health visit to limit community exposure during the Covid-19 pandemic. I spoke with the patient via phone to ensure availability of phone/video source, confirm preferred email & phone number, and discuss instructions and expectations.  I reminded Alex Jackson to be prepared with any vital sign and/or heart rhythm information that could potentially be obtained via home monitoring, at the time of his visit. I reminded Alex Jackson to expect a phone call at the time of his visit if his visit.  Did the patient verbally acknowledge consent to treatment? YES  Patient scheduled for VIDEO Visit with Jacolyn Reedy, PA on 4/7  Lattie Haw, [place] 08/08/2018 5:26 PM   DOWNLOADING THE WEBEX SOFTWARE TO SMARTPHONE  - If Apple, go to [employer] and type in WebEx in the search bar. Download [employer], the blue/green circle. The app is free but as with any other app downloads, their phone may require them to verify saved payment information or Apple password. The patient does NOT have to create an account.  - If Android, ask patient to go to [employer] and type in WebEx in the search bar. Download [employer], the blue/green circle. The app is free but as with any other app downloads, their phone may require them to verify saved payment information or Android password. The patient does NOT have to create an account.   CONSENT FOR TELE-HEALTH VISIT - PLEASE REVIEW  I hereby voluntarily request, consent and authorize [facility] and its employed or contracted physicians, physician assistants, nurse practitioners or other licensed health care professionals (the Practitioner), to provide me with telemedicine health care services (the "Services") as deemed necessary by the treating Practitioner. I acknowledge and consent to receive the  Services by the Practitioner via telemedicine. I understand that the telemedicine visit will involve communicating with the Practitioner through live audiovisual communication technology and the disclosure of certain medical information by electronic transmission. I acknowledge that I have been given the opportunity to request an in-person assessment or other available alternative prior to the telemedicine visit and am voluntarily participating in the telemedicine visit.  I understand that I have the right to withhold or withdraw my consent to the use of telemedicine in the course of my care at any time, without affecting my right to future care or treatment, and that the Practitioner or I may terminate the telemedicine visit at any time. I understand that I have the right to inspect all information obtained and/or recorded in the course of the telemedicine visit and may receive copies of available information for a reasonable fee.  I understand that some of the potential risks of receiving the Services via telemedicine include:  Marland Kitchen Delay or interruption in medical evaluation due to technological equipment failure or disruption; . Information transmitted may not be sufficient (e.g. poor resolution of images) to allow for appropriate medical decision making by the Practitioner; and/or  . In rare instances, security protocols could fail, causing a breach of personal health information.  Furthermore, I acknowledge that it is my responsibility to provide information about my medical history, conditions and care that is complete and accurate to the best of my ability. I acknowledge that Practitioner's advice, recommendations, and/or decision may be based on factors not within their control, such as incomplete or inaccurate data provided by me or distortions of  diagnostic images or specimens that may result from electronic transmissions. I understand that the practice of medicine is not an exact science and that  Practitioner makes no warranties or guarantees regarding treatment outcomes. I acknowledge that I will receive a copy of this consent concurrently upon execution via email to the email address I last provided but may also request a printed copy by calling the office of [place].    I understand that my insurance will be billed for this visit.   I have read or had this consent read to me. . I understand the contents of this consent, which adequately explains the benefits and risks of the Services being provided via telemedicine.  . I have been provided ample opportunity to ask questions regarding this consent and the Services and have had my questions answered to my satisfaction. . I give my informed consent for the services to be provided through the use of telemedicine in my medical care  By participating in this telemedicine visit I agree to the above.

## 2018-08-09 ENCOUNTER — Encounter: Payer: Self-pay | Admitting: Physician Assistant

## 2018-08-09 ENCOUNTER — Other Ambulatory Visit: Payer: Self-pay

## 2018-08-09 ENCOUNTER — Telehealth (INDEPENDENT_AMBULATORY_CARE_PROVIDER_SITE_OTHER): Payer: PPO | Admitting: Physician Assistant

## 2018-08-09 VITALS — BP 122/71 | HR 59 | Ht 71.5 in | Wt 247.4 lb

## 2018-08-09 DIAGNOSIS — I110 Hypertensive heart disease with heart failure: Secondary | ICD-10-CM

## 2018-08-09 DIAGNOSIS — I2510 Atherosclerotic heart disease of native coronary artery without angina pectoris: Secondary | ICD-10-CM

## 2018-08-09 DIAGNOSIS — E782 Mixed hyperlipidemia: Secondary | ICD-10-CM | POA: Diagnosis not present

## 2018-08-09 DIAGNOSIS — I5032 Chronic diastolic (congestive) heart failure: Secondary | ICD-10-CM

## 2018-08-09 DIAGNOSIS — I10 Essential (primary) hypertension: Secondary | ICD-10-CM

## 2018-08-09 NOTE — Patient Instructions (Signed)
Medication Instructions:  Your physician recommends that you continue on your current medications as directed. Please refer to the Current Medication list given to you today.  If you need a refill on your cardiac medications before your next appointment, please call your pharmacy.   Lab work: None Ordered  If you have labs (blood work) drawn today and your tests are completely normal, you will receive your results only by: Marland Kitchen MyChart Message (if you have MyChart) OR . A paper copy in the mail If you have any lab test that is abnormal or we need to change your treatment, we will call you to review the results.  Testing/Procedures: None ordered  Follow-Up: At [facility], you and your health needs are our priority.  As part of our continuing mission to provide you with exceptional heart care, we have created designated Provider Care Teams.  These Care Teams include your primary Cardiologist (physician) and Advanced Practice Providers (APPs -  Physician Assistants and Nurse Practitioners) who all work together to provide you with the care you need, when you need it. . You will need a follow up appointment in December 2020.  Please call our office 2 months in advance to schedule this appointment.  You may see Tobias Alexander, MD or one of the following Advanced Practice Providers on your designated Care Team:   . Robbie Lis, PA-C . Dayna Dunn, PA-C . Jacolyn Reedy, PA-C  Any Other Special Instructions Will Be Listed Below (If Applicable).

## 2018-08-09 NOTE — Progress Notes (Addendum)
Virtual Visit via Video Note   This visit type was conducted due to national recommendations for restrictions regarding the COVID-19 Pandemic (e.g. social distancing) in an effort to limit this patient's exposure and mitigate transmission in our community.  Due to his co-morbid illnesses, this patient is at least at moderate risk for complications without adequate follow up.  This format is felt to be most appropriate for this patient at this time.  All issues noted in this document were discussed and addressed.  A limited physical exam was performed with this format.  Please refer to the patient's chart for his consent to telehealth for [facility].   Evaluation Performed:  Follow-up visit  Date:  08/09/2018   ID:  Alex Jackson, Alex Jackson 1945-06-19, MRN [id number]  Patient Location: Home  Provider Location: Home  PCP:  [name], MD  Cardiologist:  Tobias Alexander, MD  Electrophysiologist:  None   Chief Complaint:  3 month f/u  History of Present Illness:    Alex Jackson is a 73 y.o. male who presents via audio/video conferencing for a telehealth visit today.    This patient has history of CAD status post N STEMI treated with stent to the LAD in 2016 EF at that time 34 to 50% that normalized.  He had another end STEMI 09/26/2017 treated with stenting of the distal RCA into the PL OM and balloon angioplasty of the PDA through the stent struts.  LVEF 55 to 60% with grade 1 DD on 2D echo at that time.  Patient also has chronic diastolic CHF, hypertension, DM and hyperlipidemia.  Patient last saw Dr. Delton See 04/19/2018 and had lost 30 pounds and was doing well.  Patient says he's doing well. He denies chest pain, dyspnea, dyspnea on exertion, edema,dizziness or presyncope. Walks 8-10,000 steps daily and light weights.   The patient does not have symptoms concerning for COVID-19 infection (fever, chills, cough, or new shortness of breath).    Past Medical History:  Diagnosis  Date  . CAD in native artery 03/01/2015   1. cath - DES to LAD residual non obstructive RCA disease in 2016 b. Cath s/p Successful PCI with stenting of the distal RCA into the PLOM with a DES. Balloon angioplasty of the PDA through the stent struts.   . Hyperlipidemia   . Hypertension    Past Surgical History:  Procedure Laterality Date  . CARDIAC CATHETERIZATION N/A 03/01/2015   Procedure: Left Heart Cath and Coronary Angiography;  Surgeon: Kathleene Hazel, MD;  Location: [facility] INVASIVE CV LAB;  Service: Cardiovascular;  Laterality: N/A;  . CORONARY STENT INTERVENTION N/A 09/28/2017   Procedure: CORONARY STENT INTERVENTION;  Surgeon: Swaziland, Peter M, MD;  Location[facility] INVASIVE CV LAB;  Service: Cardiovascular;  Laterality: N/A;  . LEFT HEART CATH AND CORONARY ANGIOGRAPHY N/A 09/28/2017   Procedure: LEFT HEART CATH AND CORONARY ANGIOGRAPHY;  Surgeon: Swaziland, Peter M, MD;  Location: [facility] INVASIVE CV LAB;  Service: Cardiovascular;  Laterality: N/A;  . NO PAST SURGERIES       Current Meds  Medication Sig  . acetaminophen (TYLENOL) 325 MG tablet Take 2 tablets (650 mg total) by mouth every 4 (four) hours as needed for headache or mild pain.  Marland Kitchen aspirin EC 81 MG tablet Take 81 mg by mouth daily.  . bisoprolol (ZEBETA) 10 MG tablet TAKE 1 TABLET BY MOUTH EVERY DAY  . clopidogrel (PLAVIX) 75 MG tablet TAKE 1 TABLET BY MOUTH EVERY DAY  . fluticasone (FLONASE) 50  MCG/ACT nasal spray USE 1 SPRAY IN EACH NOSTRIL EVERY DAY  . furosemide (LASIX) 20 MG tablet Take 1 tablet (20 mg total) by mouth daily as needed. swelling  . hydroxypropyl methylcellulose / hypromellose (ISOPTO TEARS / GONIOVISC) 2.5 % ophthalmic solution Place 1 drop into both eyes as needed for dry eyes.  Marland Kitchen lidocaine (LIDODERM) 5 % Place 1 patch onto the skin as needed.  Marland Kitchen lisinopril (PRINIVIL,ZESTRIL) 20 MG tablet TAKE 1 TABLET BY MOUTH EVERY DAY  . metFORMIN (GLUCOPHAGE) 500 MG tablet Take 1 tablet (500 mg total) by mouth 2 (two)  times daily.  . montelukast (SINGULAIR) 10 MG tablet Take 10 mg by mouth daily.  . Multiple Vitamin (MULTIVITAMIN WITH MINERALS) TABS tablet Take 1 tablet by mouth daily.  . nitroGLYCERIN (NITROSTAT) 0.4 MG SL tablet Place 1 tablet (0.4 mg total) under the tongue every 5 (five) minutes x 3 doses as needed for chest pain.  Marland Kitchen omega-3 acid ethyl esters (LOVAZA) 1 g capsule TAKE 1 CAPSULE (1 G TOTAL) BY MOUTH 2 (TWO) TIMES DAILY.  . pantoprazole (PROTONIX) 40 MG tablet TAKE 1 TABLET BY MOUTH EVERY DAY  . rosuvastatin (CRESTOR) 5 MG tablet TAKE 1 TABLET BY MOUTH EVERY DAY     Allergies:   Atorvastatin; Prednisone; and Pravastatin   Social History   Tobacco Use  . Smoking status: Former Smoker    Packs/day: 1.00    Last attempt to quit: 07/03/1983    Years since quitting: 35.1  . Smokeless tobacco: Never Used  . Tobacco comment: quit in Mar 1985  Substance Use Topics  . Alcohol use: Yes    Alcohol/week: 0.0 standard drinks    Comment: "one or two drinks a night"  . Drug use: No     Family Hx: The patient's family history includes Arrhythmia in his sister; Liver cancer in his father; Thyroid cancer in his mother.  ROS:   Please see the history of present illness.    Review of Systems  Constitution: Negative.  HENT: Negative.   Cardiovascular: Negative.   Respiratory: Negative.   Endocrine: Negative.   Hematologic/Lymphatic: Negative.   Musculoskeletal: Negative.   Gastrointestinal: Negative.   Genitourinary: Negative.   Neurological: Negative.     All other systems reviewed and are negative.   Prior CV studies:   The following studies were reviewed today:     Cardiac cath 09/28/2017 Conclusion       Previously placed Prox LAD drug eluting stent is widely patent.  Mid RCA to Dist RCA lesion is 30% stenosed.  RPDA lesion is 20% stenosed.  Dist RCA lesion is 90% stenosed with 99% stenosed side branch in Post Atrio.  A drug-eluting stent was successfully placed  using a STENT SYNERGY DES 3.5X16.  Post intervention, there is a 0% residual stenosis.  Post intervention, the side branch was reduced to 0% residual stenosis.  Ost RPDA lesion is 60% stenosed.  Balloon angioplasty was performed.  Post intervention, there is a 0% residual stenosis.  LV end diastolic pressure is normal.   1. Single vessel obstructive CAD  2. Patent stent in the LAD 3. Normal LVEDP 4. Successful PCI with stenting of the distal RCA into the PLOM with a DES. Balloon angioplasty of the PDA through the stent struts.    Plan: DAPT for one year. Risk factor modification. Anticipate DC in am.      Echo 09/27/17 Study Conclusions   - Left ventricle: The cavity size was normal. Wall thickness was  increased in a pattern of mild LVH. Systolic function was normal.   The estimated ejection fraction was in the range of 55% to 60%.   Wall motion was normal; there were no regional wall motion   abnormalities. Doppler parameters are consistent with abnormal   left ventricular relaxation (grade 1 diastolic dysfunction). - Mitral valve: Valve area by pressure half-time: 1.67 cm^2.   Impressions:   - Normal LV systolic function; mild diastolic dysfunction; mild   LVH; trace MR and TR.     Labs/Other Tests and Data Reviewed:    EKG:  An ECG dated 04/19/2018 was personally reviewed today and demonstrated:  Normal sinus rhythm, normal EKG  Recent Labs: 09/26/2017: B Natriuretic Peptide 73.1 04/19/2018: ALT 20; BUN 14; Creatinine, Ser 0.71; Hemoglobin 12.4; Platelets 251; Potassium 4.9; Sodium 143; TSH 1.700   Recent Lipid Panel Lab Results  Component Value Date/Time   CHOL 145 04/19/2018 08:55 AM   TRIG 173 (H) 04/19/2018 08:55 AM   HDL 35 (L) 04/19/2018 08:55 AM   CHOLHDL 4.1 04/19/2018 08:55 AM   CHOLHDL 5.4 09/27/2017 02:22 AM   LDLCALC 75 04/19/2018 08:55 AM    Wt Readings from Last 3 Encounters:  08/09/18 247 lb 6.4 oz (112.2 kg)  04/19/18 248 lb 6.4 oz  (112.7 kg)  10/11/17 281 lb (127.5 kg)     Objective:    Vital Signs:  BP 122/71 (BP Location: Left Arm, Patient Position: Sitting, Cuff Size: Normal)   Pulse (!) 59   Ht 5' 11.5" (1.816 m)   Wt 247 lb 6.4 oz (112.2 kg)   BMI 34.02 kg/m    Well nourished, well developed male in no  acute distress. No JVD, or leg swelling.    ASSESSMENT & PLAN:    CAD NSTEMI 09/2017 with Successful PCI with stenting of the distal RCA into the PLOM with a DES. Balloon angioplasty of the PDA through the stent struts, patent LAD stent.  On Plavix and aspirin. No angina. F/u with Dr. Delton SeeNelson in December   Chronic diastolic CHF and 1 DD on 2D echo 09/2017 on Lasix 20 mg daily- renal function stable in december  Essential hypertension BP controlled on lisinopril and bisoprolol  Hyperlipidemia on Crestor and Lovaza LDL 75 slightly above goal. Has lost 30 lbs and exercising. No changes today.   COVID-19 Education: The signs and symptoms of COVID-19 were discussed with the patient and how to seek care for testing (follow up with PCP or arrange E-visit).   The importance of social distancing was discussed today.  Time:   Today, I have spent 15 minutes with the patient with telehealth technology discussing the above problems.     Medication Adjustments/Labs and Tests Ordered: Current medicines are reviewed at length with the patient today.  Concerns regarding medicines are outlined above.  Tests Ordered: No orders of the defined types were placed in this encounter.  Medication Changes: No orders of the defined types were placed in this encounter.   Disposition:  Follow up in 8 month(s) with Dr. Delton SeeNelson  Signed, Jacolyn ReedyMichele Senna Lape, PA-C  08/09/2018 2:57 PM    [facility]

## 2018-08-16 DIAGNOSIS — Z03818 Encounter for observation for suspected exposure to other biological agents ruled out: Secondary | ICD-10-CM | POA: Diagnosis not present

## 2018-08-16 DIAGNOSIS — R509 Fever, unspecified: Secondary | ICD-10-CM | POA: Diagnosis not present

## 2018-08-17 DIAGNOSIS — R05 Cough: Secondary | ICD-10-CM | POA: Diagnosis not present

## 2018-08-31 DIAGNOSIS — E1169 Type 2 diabetes mellitus with other specified complication: Secondary | ICD-10-CM | POA: Diagnosis not present

## 2018-08-31 DIAGNOSIS — R972 Elevated prostate specific antigen [PSA]: Secondary | ICD-10-CM | POA: Diagnosis not present

## 2018-08-31 DIAGNOSIS — Z03818 Encounter for observation for suspected exposure to other biological agents ruled out: Secondary | ICD-10-CM | POA: Diagnosis not present

## 2018-08-31 DIAGNOSIS — I2510 Atherosclerotic heart disease of native coronary artery without angina pectoris: Secondary | ICD-10-CM | POA: Diagnosis not present

## 2018-08-31 DIAGNOSIS — S86019A Strain of unspecified Achilles tendon, initial encounter: Secondary | ICD-10-CM | POA: Diagnosis not present

## 2018-08-31 DIAGNOSIS — I10 Essential (primary) hypertension: Secondary | ICD-10-CM | POA: Diagnosis not present

## 2018-08-31 DIAGNOSIS — E785 Hyperlipidemia, unspecified: Secondary | ICD-10-CM | POA: Diagnosis not present

## 2018-09-29 DIAGNOSIS — E785 Hyperlipidemia, unspecified: Secondary | ICD-10-CM | POA: Diagnosis not present

## 2018-09-29 DIAGNOSIS — E1169 Type 2 diabetes mellitus with other specified complication: Secondary | ICD-10-CM | POA: Diagnosis not present

## 2018-09-29 DIAGNOSIS — R972 Elevated prostate specific antigen [PSA]: Secondary | ICD-10-CM | POA: Diagnosis not present

## 2018-09-29 DIAGNOSIS — Z7984 Long term (current) use of oral hypoglycemic drugs: Secondary | ICD-10-CM | POA: Diagnosis not present

## 2018-10-04 DIAGNOSIS — R972 Elevated prostate specific antigen [PSA]: Secondary | ICD-10-CM | POA: Diagnosis not present

## 2018-10-04 DIAGNOSIS — N5201 Erectile dysfunction due to arterial insufficiency: Secondary | ICD-10-CM | POA: Diagnosis not present

## 2018-10-30 ENCOUNTER — Other Ambulatory Visit: Payer: Self-pay | Admitting: Physician Assistant

## 2018-10-30 DIAGNOSIS — E785 Hyperlipidemia, unspecified: Secondary | ICD-10-CM

## 2018-10-30 DIAGNOSIS — Z955 Presence of coronary angioplasty implant and graft: Secondary | ICD-10-CM

## 2018-10-30 DIAGNOSIS — I2510 Atherosclerotic heart disease of native coronary artery without angina pectoris: Secondary | ICD-10-CM

## 2018-10-30 DIAGNOSIS — I255 Ischemic cardiomyopathy: Secondary | ICD-10-CM

## 2018-10-30 DIAGNOSIS — I10 Essential (primary) hypertension: Secondary | ICD-10-CM

## 2018-10-30 DIAGNOSIS — I25110 Atherosclerotic heart disease of native coronary artery with unstable angina pectoris: Secondary | ICD-10-CM

## 2018-11-16 DIAGNOSIS — H25811 Combined forms of age-related cataract, right eye: Secondary | ICD-10-CM | POA: Diagnosis not present

## 2018-11-16 DIAGNOSIS — Z01818 Encounter for other preprocedural examination: Secondary | ICD-10-CM | POA: Diagnosis not present

## 2018-11-28 DIAGNOSIS — H2511 Age-related nuclear cataract, right eye: Secondary | ICD-10-CM | POA: Diagnosis not present

## 2018-11-28 DIAGNOSIS — H25811 Combined forms of age-related cataract, right eye: Secondary | ICD-10-CM | POA: Diagnosis not present

## 2018-12-12 DIAGNOSIS — H2512 Age-related nuclear cataract, left eye: Secondary | ICD-10-CM | POA: Diagnosis not present

## 2018-12-12 DIAGNOSIS — H25812 Combined forms of age-related cataract, left eye: Secondary | ICD-10-CM | POA: Diagnosis not present

## 2019-01-03 DIAGNOSIS — I10 Essential (primary) hypertension: Secondary | ICD-10-CM | POA: Diagnosis not present

## 2019-01-03 DIAGNOSIS — E785 Hyperlipidemia, unspecified: Secondary | ICD-10-CM | POA: Diagnosis not present

## 2019-01-03 DIAGNOSIS — R972 Elevated prostate specific antigen [PSA]: Secondary | ICD-10-CM | POA: Diagnosis not present

## 2019-01-03 DIAGNOSIS — I2510 Atherosclerotic heart disease of native coronary artery without angina pectoris: Secondary | ICD-10-CM | POA: Diagnosis not present

## 2019-01-03 DIAGNOSIS — E1169 Type 2 diabetes mellitus with other specified complication: Secondary | ICD-10-CM | POA: Diagnosis not present

## 2019-01-19 DIAGNOSIS — M48061 Spinal stenosis, lumbar region without neurogenic claudication: Secondary | ICD-10-CM | POA: Diagnosis not present

## 2019-01-19 DIAGNOSIS — E1169 Type 2 diabetes mellitus with other specified complication: Secondary | ICD-10-CM | POA: Diagnosis not present

## 2019-01-19 DIAGNOSIS — Z6835 Body mass index (BMI) 35.0-35.9, adult: Secondary | ICD-10-CM | POA: Diagnosis not present

## 2019-01-19 DIAGNOSIS — R972 Elevated prostate specific antigen [PSA]: Secondary | ICD-10-CM | POA: Diagnosis not present

## 2019-01-19 DIAGNOSIS — Z7984 Long term (current) use of oral hypoglycemic drugs: Secondary | ICD-10-CM | POA: Diagnosis not present

## 2019-01-19 DIAGNOSIS — Z23 Encounter for immunization: Secondary | ICD-10-CM | POA: Diagnosis not present

## 2019-01-19 DIAGNOSIS — I2510 Atherosclerotic heart disease of native coronary artery without angina pectoris: Secondary | ICD-10-CM | POA: Diagnosis not present

## 2019-01-19 DIAGNOSIS — E785 Hyperlipidemia, unspecified: Secondary | ICD-10-CM | POA: Diagnosis not present

## 2019-02-08 DIAGNOSIS — J019 Acute sinusitis, unspecified: Secondary | ICD-10-CM | POA: Diagnosis not present

## 2019-03-19 ENCOUNTER — Other Ambulatory Visit: Payer: Self-pay | Admitting: Cardiology

## 2019-04-03 DIAGNOSIS — R972 Elevated prostate specific antigen [PSA]: Secondary | ICD-10-CM | POA: Diagnosis not present

## 2019-04-10 DIAGNOSIS — N5201 Erectile dysfunction due to arterial insufficiency: Secondary | ICD-10-CM | POA: Diagnosis not present

## 2019-04-10 DIAGNOSIS — R972 Elevated prostate specific antigen [PSA]: Secondary | ICD-10-CM | POA: Diagnosis not present

## 2019-05-03 ENCOUNTER — Other Ambulatory Visit: Payer: Self-pay | Admitting: Cardiology

## 2019-05-03 DIAGNOSIS — E785 Hyperlipidemia, unspecified: Secondary | ICD-10-CM

## 2019-05-03 DIAGNOSIS — I10 Essential (primary) hypertension: Secondary | ICD-10-CM

## 2019-05-03 DIAGNOSIS — I2510 Atherosclerotic heart disease of native coronary artery without angina pectoris: Secondary | ICD-10-CM

## 2019-05-03 DIAGNOSIS — I255 Ischemic cardiomyopathy: Secondary | ICD-10-CM

## 2019-05-03 DIAGNOSIS — Z955 Presence of coronary angioplasty implant and graft: Secondary | ICD-10-CM

## 2019-05-03 DIAGNOSIS — I25110 Atherosclerotic heart disease of native coronary artery with unstable angina pectoris: Secondary | ICD-10-CM

## 2019-05-10 ENCOUNTER — Other Ambulatory Visit: Payer: Self-pay | Admitting: Sports Medicine

## 2019-05-10 DIAGNOSIS — M48061 Spinal stenosis, lumbar region without neurogenic claudication: Secondary | ICD-10-CM

## 2019-05-16 DIAGNOSIS — M5430 Sciatica, unspecified side: Secondary | ICD-10-CM | POA: Diagnosis not present

## 2019-05-16 DIAGNOSIS — I2510 Atherosclerotic heart disease of native coronary artery without angina pectoris: Secondary | ICD-10-CM | POA: Diagnosis not present

## 2019-05-16 DIAGNOSIS — I10 Essential (primary) hypertension: Secondary | ICD-10-CM | POA: Diagnosis not present

## 2019-05-16 DIAGNOSIS — E785 Hyperlipidemia, unspecified: Secondary | ICD-10-CM | POA: Diagnosis not present

## 2019-05-16 DIAGNOSIS — Z0000 Encounter for general adult medical examination without abnormal findings: Secondary | ICD-10-CM | POA: Diagnosis not present

## 2019-05-16 DIAGNOSIS — E1169 Type 2 diabetes mellitus with other specified complication: Secondary | ICD-10-CM | POA: Diagnosis not present

## 2019-05-16 DIAGNOSIS — R972 Elevated prostate specific antigen [PSA]: Secondary | ICD-10-CM | POA: Diagnosis not present

## 2019-05-17 ENCOUNTER — Ambulatory Visit
Admission: RE | Admit: 2019-05-17 | Discharge: 2019-05-17 | Disposition: A | Discharge status: Home / Self Care | Payer: PPO | Source: Ambulatory Visit | Attending: Sports Medicine | Admitting: Sports Medicine

## 2019-05-17 DIAGNOSIS — M48061 Spinal stenosis, lumbar region without neurogenic claudication: Secondary | ICD-10-CM | POA: Diagnosis not present

## 2019-05-17 DIAGNOSIS — J329 Chronic sinusitis, unspecified: Secondary | ICD-10-CM | POA: Insufficient documentation

## 2019-05-17 MED ORDER — IOPAMIDOL (ISOVUE-M 200) INJECTION 41%
1.0000 mL | Freq: Once | INTRAMUSCULAR | Status: AC
  Administered 2019-05-17: 1 mL via EPIDURAL

## 2019-05-17 MED ORDER — METHYLPREDNISOLONE ACETATE 40 MG/ML INJ SUSP (RADIOLOG
120.0000 mg | Freq: Once | INTRAMUSCULAR | Status: AC
  Administered 2019-05-17: 120 mg via EPIDURAL

## 2019-05-17 NOTE — Discharge Instructions (Signed)

## 2019-05-23 DIAGNOSIS — J310 Chronic rhinitis: Secondary | ICD-10-CM | POA: Diagnosis not present

## 2019-05-23 DIAGNOSIS — J329 Chronic sinusitis, unspecified: Secondary | ICD-10-CM | POA: Diagnosis not present

## 2019-05-24 ENCOUNTER — Ambulatory Visit: Payer: PPO | Attending: Internal Medicine

## 2019-05-24 DIAGNOSIS — Z23 Encounter for immunization: Secondary | ICD-10-CM | POA: Insufficient documentation

## 2019-05-24 NOTE — Progress Notes (Signed)
   Covid-19 Vaccination Clinic  Name:  Alex Jackson    MRN: [id number] DOB: [birth year]  05/24/2019  Alex Jackson was observed post Covid-19 immunization for 15 minutes without incidence. He was provided with Vaccine Information Sheet and instruction to access the V-Safe system.   Alex Jackson was instructed to call 911 with any severe reactions post vaccine: Marland Kitchen Difficulty breathing  . Swelling of your face and throat  . A fast heartbeat  . A bad rash all over your body  . Dizziness and weakness    Immunizations Administered    Name Date Dose VIS Date Route   Pfizer COVID-19 Vaccine 05/24/2019  4:40 PM 0.3 mL 04/14/2019 Intramuscular   Manufacturer: [employer], [employer]   Lot: [id number]   NDC: [address]-6

## 2019-06-07 DIAGNOSIS — M48061 Spinal stenosis, lumbar region without neurogenic claudication: Secondary | ICD-10-CM | POA: Diagnosis not present

## 2019-06-07 DIAGNOSIS — Z6837 Body mass index (BMI) 37.0-37.9, adult: Secondary | ICD-10-CM | POA: Diagnosis not present

## 2019-06-14 ENCOUNTER — Ambulatory Visit: Payer: PPO | Attending: Internal Medicine

## 2019-06-14 DIAGNOSIS — Z23 Encounter for immunization: Secondary | ICD-10-CM | POA: Insufficient documentation

## 2019-06-14 NOTE — Progress Notes (Signed)
   Covid-19 Vaccination Clinic  Name:  NICKLOUS ABURTO    MRN: [id number] DOB: 1946-03-08  06/14/2019  Mr. Rowlette was observed post Covid-19 immunization for 15 minutes without incidence. He was provided with Vaccine Information Sheet and instruction to access the V-Safe system.   Mr. Warshawsky was instructed to call 911 with any severe reactions post vaccine: Marland Kitchen Difficulty breathing  . Swelling of your face and throat  . A fast heartbeat  . A bad rash all over your body  . Dizziness and weakness    Immunizations Administered    Name Date Dose VIS Date Route   Pfizer COVID-19 Vaccine 06/14/2019 10:07 AM 0.3 mL 04/14/2019 Intramuscular   Manufacturer: [employer], [employer]   Lot: [id number]   NDC: [address]-9

## 2019-06-26 ENCOUNTER — Other Ambulatory Visit: Payer: Self-pay

## 2019-06-26 ENCOUNTER — Encounter: Payer: Self-pay | Admitting: Cardiology

## 2019-06-26 ENCOUNTER — Ambulatory Visit: Payer: PPO

## 2019-06-26 ENCOUNTER — Ambulatory Visit: Payer: PPO | Admitting: Cardiology

## 2019-06-26 VITALS — BP 144/76 | HR 71 | Ht 71.5 in | Wt 273.8 lb

## 2019-06-26 DIAGNOSIS — I2510 Atherosclerotic heart disease of native coronary artery without angina pectoris: Secondary | ICD-10-CM | POA: Diagnosis not present

## 2019-06-26 DIAGNOSIS — Z955 Presence of coronary angioplasty implant and graft: Secondary | ICD-10-CM | POA: Diagnosis not present

## 2019-06-26 DIAGNOSIS — I5032 Chronic diastolic (congestive) heart failure: Secondary | ICD-10-CM | POA: Diagnosis not present

## 2019-06-26 DIAGNOSIS — I10 Essential (primary) hypertension: Secondary | ICD-10-CM

## 2019-06-26 DIAGNOSIS — E782 Mixed hyperlipidemia: Secondary | ICD-10-CM | POA: Diagnosis not present

## 2019-06-26 LAB — COMPREHENSIVE METABOLIC PANEL
ALT: 31 IU/L (ref 0–44)
AST: 28 IU/L (ref 0–40)
Albumin/Globulin Ratio: 1.8 (ref 1.2–2.2)
Albumin: 4.3 g/dL (ref 3.7–4.7)
Alkaline Phosphatase: 69 IU/L (ref 39–117)
BUN/Creatinine Ratio: 14 (ref 10–24)
BUN: 13 mg/dL (ref 8–27)
Bilirubin Total: 0.5 mg/dL (ref 0.0–1.2)
CO2: 24 mmol/L (ref 20–29)
Calcium: 9.6 mg/dL (ref 8.6–10.2)
Chloride: 100 mmol/L (ref 96–106)
Creatinine, Ser: 0.92 mg/dL (ref 0.76–1.27)
GFR calc Af Amer: 96 mL/min/{1.73_m2} (ref >59)
GFR calc non Af Amer: 83 mL/min/{1.73_m2} (ref >59)
Globulin, Total: 2.4 g/dL (ref 1.5–4.5)
Glucose: 141 mg/dL — ABNORMAL HIGH (ref 65–99)
Potassium: 4.2 mmol/L (ref 3.5–5.2)
Sodium: 141 mmol/L (ref 134–144)
Total Protein: 6.7 g/dL (ref 6.0–8.5)

## 2019-06-26 LAB — LIPID PANEL
Chol/HDL Ratio: 4.4 ratio (ref 0.0–5.0)
Cholesterol, Total: 141 mg/dL (ref 100–199)
HDL: 32 mg/dL — ABNORMAL LOW (ref >39)
LDL Chol Calc (NIH): 69 mg/dL (ref 0–99)
Triglycerides: 242 mg/dL — ABNORMAL HIGH (ref 0–149)
VLDL Cholesterol Cal: 40 mg/dL (ref 5–40)

## 2019-06-26 LAB — CBC WITH DIFFERENTIAL/PLATELET
Basophils Absolute: 0.1 10*3/uL (ref 0.0–0.2)
Basos: 1 %
EOS (ABSOLUTE): 0.2 10*3/uL (ref 0.0–0.4)
Eos: 3 %
Hematocrit: 39.9 % (ref 37.5–51.0)
Hemoglobin: 13.8 g/dL (ref 13.0–17.7)
Immature Grans (Abs): 0.1 10*3/uL (ref 0.0–0.1)
Immature Granulocytes: 1 %
Lymphocytes Absolute: 1.4 10*3/uL (ref 0.7–3.1)
Lymphs: 19 %
MCH: 32.1 pg (ref 26.6–33.0)
MCHC: 34.6 g/dL (ref 31.5–35.7)
MCV: 93 fL (ref 79–97)
Monocytes Absolute: 0.8 10*3/uL (ref 0.1–0.9)
Monocytes: 11 %
Neutrophils Absolute: 4.8 10*3/uL (ref 1.4–7.0)
Neutrophils: 65 %
Platelets: 216 10*3/uL (ref 150–450)
RBC: 4.3 x10E6/uL (ref 4.14–5.80)
RDW: 12 % (ref 11.6–15.4)
WBC: 7.4 10*3/uL (ref 3.4–10.8)

## 2019-06-26 LAB — PSA: Prostate Specific Ag, Serum: 6.6 ng/mL — ABNORMAL HIGH (ref 0.0–4.0)

## 2019-06-26 LAB — TSH: TSH: 1.76 u[IU]/mL (ref 0.450–4.500)

## 2019-06-26 NOTE — Patient Instructions (Addendum)
Medication Instructions:   *If you need a refill on your cardiac medications before your next appointment, please call your pharmacy*  Lab Work: Your physician recommends that you have lab work today- PSA, CBC, CMP, TSH, Lipids  If you have labs (blood work) drawn today and your tests are completely normal, you will receive your results only by: Marland Kitchen MyChart Message (if you have MyChart) OR . A paper copy in the mail If you have any lab test that is abnormal or we need to change your treatment, we will call you to review the results.  Follow-Up: At [facility], you and your health needs are our priority.  As part of our continuing mission to provide you with exceptional heart care, we have created designated Provider Care Teams.  These Care Teams include your primary Cardiologist (physician) and Advanced Practice Providers (APPs -  Physician Assistants and Nurse Practitioners) who all work together to provide you with the care you need, when you need it.  Your next appointment:   12 month(s)  The format for your next appointment:   In Person  Provider:   You may see Tobias Alexander, MD or one of the following Advanced Practice Providers on your designated Care Team:    Ronie Spies, PA-C  Jacolyn Reedy, PA-C

## 2019-06-26 NOTE — Progress Notes (Signed)
Cardiology Office Note:    Date:  06/28/2019   ID:  Alex Jackson, DOB [birth year], MRN [id number]  PCP:  [name], MD  Cardiologist:  Tobias Alexander, MD  Electrophysiologist:  None   Referring MD: [name], MD   Reason for visit: Dyspnea on exertion  History of Present Illness:    Alex Jackson is a 74 y.o. male with a hx of CAD status post NSTEMI treated with stent to the LAD in 2016 EF at that time 34 to 50% that normalized.  He had another end STEMI 09/26/2017 treated with stenting of the distal RCA into the PL OM and balloon angioplasty of the PDA through the stent struts.  LVEF 55 to 60% with grade 1 DD on 2D echo at that time.  Patient also has chronic diastolic CHF, hypertension, DM and hyperlipidemia.  Patient is coming after year, he has been doing relatively well, but has noticed worsening dyspnea on exertion.  No chest pain.  He has also been experiencing palpitations that are not associated with chest pain or shortness of breath.  He has been compliant with his medication that he is tolerating well he denies any lower extremity edema orthopnea or proximal nocturnal dyspnea.  Past Medical History:  Diagnosis Date  . CAD in native artery 03/01/2015   1. cath - DES to LAD residual non obstructive RCA disease in 2016 b. Cath s/p Successful PCI with stenting of the distal RCA into the PLOM with a DES. Balloon angioplasty of the PDA through the stent struts.   . Hyperlipidemia   . Hypertension    Past Surgical History:  Procedure Laterality Date  . CARDIAC CATHETERIZATION N/A 03/01/2015   Procedure: Left Heart Cath and Coronary Angiography;  Surgeon: Kathleene Hazel, MD;  Location: [facility] INVASIVE CV LAB;  Service: Cardiovascular;  Laterality: N/A;  . CORONARY STENT INTERVENTION N/A 09/28/2017   Procedure: CORONARY STENT INTERVENTION;  Surgeon: Swaziland, Peter M, MD;  Location: [facility] INVASIVE CV LAB;  Service: Cardiovascular;  Laterality: N/A;  . LEFT HEART CATH  AND CORONARY ANGIOGRAPHY N/A 09/28/2017   Procedure: LEFT HEART CATH AND CORONARY ANGIOGRAPHY;  Surgeon: Swaziland, Peter M, MD;  Location: [facility] INVASIVE CV LAB;  Service: Cardiovascular;  Laterality: N/A;  . NO PAST SURGERIES     Current Medications: Current Meds  Medication Sig  . acetaminophen (TYLENOL) 325 MG tablet Take 2 tablets (650 mg total) by mouth every 4 (four) hours as needed for headache or mild pain.  Marland Kitchen aspirin EC 81 MG tablet Take 81 mg by mouth daily.  . bisoprolol (ZEBETA) 10 MG tablet TAKE 1 TABLET BY MOUTH EVERY DAY  . chlorthalidone (HYGROTON) 25 MG tablet Take 0.5 tablets by mouth daily.  . fluticasone (FLONASE) 50 MCG/ACT nasal spray USE 1 SPRAY IN EACH NOSTRIL EVERY DAY  . furosemide (LASIX) 20 MG tablet TAKE 1 TABLET (20 MG TOTAL) BY MOUTH DAILY AS NEEDED. SWELLING  . hydroxypropyl methylcellulose / hypromellose (ISOPTO TEARS / GONIOVISC) 2.5 % ophthalmic solution Place 1 drop into both eyes as needed for dry eyes.  Marland Kitchen lidocaine (LIDODERM) 5 % Place 1 patch onto the skin as needed.  Marland Kitchen lisinopril (ZESTRIL) 20 MG tablet TAKE 1 TABLET BY MOUTH EVERY DAY  . metFORMIN (GLUCOPHAGE) 500 MG tablet Take 1 tablet (500 mg total) by mouth 2 (two) times daily.  . montelukast (SINGULAIR) 10 MG tablet Take 10 mg by mouth daily.  . Multiple Vitamin (MULTIVITAMIN WITH MINERALS) TABS tablet Take 1 tablet  by mouth daily.  . nitroGLYCERIN (NITROSTAT) 0.4 MG SL tablet Place 1 tablet (0.4 mg total) under the tongue every 5 (five) minutes x 3 doses as needed for chest pain.  Marland Kitchen omega-3 acid ethyl esters (LOVAZA) 1 g capsule TAKE 1 CAPSULE (1 G TOTAL) BY MOUTH 2 (TWO) TIMES DAILY.  . pantoprazole (PROTONIX) 40 MG tablet TAKE 1 TABLET BY MOUTH EVERY DAY  . rosuvastatin (CRESTOR) 5 MG tablet TAKE 1 TABLET BY MOUTH EVERY DAY     Allergies:   Prednisone, Atorvastatin, and Pravastatin   Social History   Socioeconomic History  . Marital status: Divorced    Spouse name: Not on file  . Number of  children: Not on file  . Years of education: Not on file  . Highest education level: Not on file  Occupational History  . Not on file  Tobacco Use  . Smoking status: Former Smoker    Packs/day: 1.00    Quit date: 07/03/1983    Years since quitting: 36.0  . Smokeless tobacco: Never Used  . Tobacco comment: quit in Mar 1985  Substance and Sexual Activity  . Alcohol use: Yes    Alcohol/week: 0.0 standard drinks    Comment: "one or two drinks a night"  . Drug use: No  . Sexual activity: Not on file  Other Topics Concern  . Not on file  Social History Narrative  . Not on file   Social Determinants of Health   Financial Resource Strain:   . Difficulty of Paying Living Expenses: Not on file  Food Insecurity:   . Worried About Charity fundraiser in the Last Year: Not on file  . Ran Out of Food in the Last Year: Not on file  Transportation Needs:   . Lack of Transportation (Medical): Not on file  . Lack of Transportation (Non-Medical): Not on file  Physical Activity:   . Days of Exercise per Week: Not on file  . Minutes of Exercise per Session: Not on file  Stress:   . Feeling of Stress : Not on file  Social Connections:   . Frequency of Communication with Friends and Family: Not on file  . Frequency of Social Gatherings with Friends and Family: Not on file  . Attends Religious Services: Not on file  . Active Member of Clubs or Organizations: Not on file  . Attends Archivist Meetings: Not on file  . Marital Status: Not on file     Family History: The patient's family history includes Arrhythmia in his sister; Liver cancer in his father; Thyroid cancer in his mother.  ROS:   Please see the history of present illness.    All other systems reviewed and are negative.  EKGs/Labs/Other Studies Reviewed:    The following studies were reviewed today:  EKG:  EKG is ordered today.  The ekg ordered today demonstrates normal sinus rhythm, normal EKG.  Unchanged from  prior.  Personally reviewed.  Recent Labs: 06/26/2019: ALT 31; BUN 13; Creatinine, Ser 0.92; Hemoglobin 13.8; Platelets 216; Potassium 4.2; Sodium 141; TSH 1.760  Recent Lipid Panel    Component Value Date/Time   CHOL 141 06/26/2019 0845   TRIG 242 (H) 06/26/2019 0845   HDL 32 (L) 06/26/2019 0845   CHOLHDL 4.4 06/26/2019 0845   CHOLHDL 5.4 09/27/2017 0222   VLDL UNABLE TO CALCULATE IF TRIGLYCERIDE OVER 400 mg/dL 09/27/2017 0222   LDLCALC 69 06/26/2019 0845    Physical Exam:    VS:  BP Marland Kitchen)  144/76   Pulse 71   Ht 5' 11.5" (1.816 m)   Wt 273 lb 12.8 oz (124.2 kg)   SpO2 94%   BMI 37.66 kg/m     Wt Readings from Last 3 Encounters:  06/26/19 273 lb 12.8 oz (124.2 kg)  08/09/18 247 lb 6.4 oz (112.2 kg)  04/19/18 248 lb 6.4 oz (112.7 kg)     GEN:  Well nourished, well developed in no acute distress HEENT: Normal NECK: No JVD; No carotid bruits LYMPHATICS: No lymphadenopathy CARDIAC: RRR, no murmurs, rubs, gallops RESPIRATORY:  Clear to auscultation without rales, wheezing or rhonchi  ABDOMEN: Soft, non-tender, non-distended MUSCULOSKELETAL:  No edema; No deformity  SKIN: Warm and dry NEUROLOGIC:  Alert and oriented x 3 PSYCHIATRIC:  Normal affect   TTLeft ventricle: The cavity size was normal. Wall thickness was  increased in a pattern of mild LVH. Systolic function was normal.  The estimated ejection fraction was in the range of 55% to 60%.  Wall motion was normal; there were no regional wall motion  abnormalities. Doppler parameters are consistent with abnormal  left ventricular relaxation (grade 1 diastolic dysfunction).  - Mitral valve: Valve area by pressure half-time: 1.67 cm^2.   Impressions:   - Normal LV systolic function; mild diastolic dysfunction; mild  LVH; trace MR and TR. E: 09/2017  Left ventricle: The cavity size was normal. Wall thickness was  increased in a pattern of mild LVH. Systolic function was normal.  The estimated ejection  fraction was in the range of 55% to 60%.  Wall motion was normal; there were no regional wall motion  abnormalities. Doppler parameters are consistent with abnormal  left ventricular relaxation (grade 1 diastolic dysfunction).  - Mitral valve: Valve area by pressure half-time: 1.67 cm^2.   Impressions:   - Normal LV systolic function; mild diastolic dysfunction; mild  LVH; trace MR and TR.    ASSESSMENT:    1. Coronary artery disease involving native coronary artery of native heart without angina pectoris    PLAN:    In order of problems listed above:  CAD NSTEMI 09/2017 with Successful PCI with stenting of the distal RCA into the PLOM with a DES. Balloon angioplasty of the PDA through the stent struts, patent LAD stent.  On Plavix and aspirin.  He has worsening dyspnea on exertion, we will obtain Lexiscan nuclear stress test.  Palpitations -we will obtain 1 week Zio patch monitor.   Chronic diastolic CHF -patient appears euvolemic.  Essential hypertension BP controlled on lisinopril and bisoprolol  Hyperlipidemia on Crestor and Lovaza, he is LDL is 69, triglycerides 242, I would increase Crestor to 10 mg daily.  Medication Adjustments/Labs and Tests Ordered: Current medicines are reviewed at length with the patient today.  Concerns regarding medicines are outlined above.  Orders Placed This Encounter  Procedures  . Lipid panel  . Comprehensive metabolic panel  . CBC with Differential/Platelet  . TSH  . PSA  . EKG 12-Lead   No orders of the defined types were placed in this encounter.   Patient Instructions  Medication Instructions:   *If you need a refill on your cardiac medications before your next appointment, please call your pharmacy*  Lab Work: Your physician recommends that you have lab work today- PSA, CBC, CMP, TSH, Lipids  If you have labs (blood work) drawn today and your tests are completely normal, you will receive your results only  by: Marland Kitchen MyChart Message (if you have MyChart) OR . A paper  copy in the mail If you have any lab test that is abnormal or we need to change your treatment, we will call you to review the results.  Follow-Up: At [facility], you and your health needs are our priority.  As part of our continuing mission to provide you with exceptional heart care, we have created designated Provider Care Teams.  These Care Teams include your primary Cardiologist (physician) and Advanced Practice Providers (APPs -  Physician Assistants and Nurse Practitioners) who all work together to provide you with the care you need, when you need it.  Your next appointment:   12 month(s)  The format for your next appointment:   In Person  Provider:   You may see Tobias Alexander, MD or one of the following Advanced Practice Providers on your designated Care Team:    Ronie Spies, PA-C  Jacolyn Reedy, PA-C     Signed, Tobias Alexander, MD  06/28/2019 12:25 AM    [facility]

## 2019-06-28 ENCOUNTER — Telehealth: Payer: Self-pay

## 2019-06-28 MED ORDER — ROSUVASTATIN CALCIUM 10 MG PO TABS: 10.0000 mg | ORAL_TABLET | Freq: Every day | ORAL | 3 refills | Status: DC

## 2019-06-28 NOTE — Telephone Encounter (Signed)
-----   Message from Lars Masson, MD sent at 06/28/2019 12:29 AM EST ----- The labs are normal except for elevated triglycerides, I would increase his rosuvastatin to 10 mg daily.  Also his PSA is elevated 6.6, he is advised to follow with his primary care physician or urologist.

## 2019-06-28 NOTE — Telephone Encounter (Signed)
Pt verbalized understanding of his lab results will being seeing his Urologist Dr. Tiburcio Pea in 2 weeks with [facility]. Will send new RX for Crestor for [facility] to [facility].

## 2019-07-10 DIAGNOSIS — S0502XA Injury of conjunctiva and corneal abrasion without foreign body, left eye, initial encounter: Secondary | ICD-10-CM | POA: Diagnosis not present

## 2019-07-30 ENCOUNTER — Other Ambulatory Visit: Payer: Self-pay | Admitting: Cardiology

## 2019-08-09 ENCOUNTER — Other Ambulatory Visit: Payer: Self-pay | Admitting: Sports Medicine

## 2019-08-09 DIAGNOSIS — M48061 Spinal stenosis, lumbar region without neurogenic claudication: Secondary | ICD-10-CM

## 2019-08-15 ENCOUNTER — Ambulatory Visit
Admission: RE | Admit: 2019-08-15 | Discharge: 2019-08-15 | Disposition: A | Discharge status: Home / Self Care | Payer: PPO | Source: Ambulatory Visit | Attending: Sports Medicine | Admitting: Sports Medicine

## 2019-08-15 ENCOUNTER — Other Ambulatory Visit: Payer: Self-pay

## 2019-08-15 DIAGNOSIS — M48061 Spinal stenosis, lumbar region without neurogenic claudication: Secondary | ICD-10-CM

## 2019-08-15 DIAGNOSIS — M545 Low back pain: Secondary | ICD-10-CM | POA: Diagnosis not present

## 2019-08-15 MED ORDER — IOPAMIDOL (ISOVUE-M 200) INJECTION 41%
1.0000 mL | Freq: Once | INTRAMUSCULAR | Status: AC
  Administered 2019-08-15: 1 mL via EPIDURAL

## 2019-08-15 MED ORDER — METHYLPREDNISOLONE ACETATE 40 MG/ML INJ SUSP (RADIOLOG
120.0000 mg | Freq: Once | INTRAMUSCULAR | Status: AC
  Administered 2019-08-15: 120 mg via EPIDURAL

## 2019-09-04 DIAGNOSIS — R972 Elevated prostate specific antigen [PSA]: Secondary | ICD-10-CM | POA: Diagnosis not present

## 2019-09-04 DIAGNOSIS — I2510 Atherosclerotic heart disease of native coronary artery without angina pectoris: Secondary | ICD-10-CM | POA: Diagnosis not present

## 2019-09-04 DIAGNOSIS — E785 Hyperlipidemia, unspecified: Secondary | ICD-10-CM | POA: Diagnosis not present

## 2019-09-04 DIAGNOSIS — E1169 Type 2 diabetes mellitus with other specified complication: Secondary | ICD-10-CM | POA: Diagnosis not present

## 2019-09-04 DIAGNOSIS — I10 Essential (primary) hypertension: Secondary | ICD-10-CM | POA: Diagnosis not present

## 2019-09-17 ENCOUNTER — Other Ambulatory Visit: Payer: Self-pay | Admitting: Cardiology

## 2019-10-09 DIAGNOSIS — R972 Elevated prostate specific antigen [PSA]: Secondary | ICD-10-CM | POA: Diagnosis not present

## 2019-10-16 DIAGNOSIS — N5201 Erectile dysfunction due to arterial insufficiency: Secondary | ICD-10-CM | POA: Diagnosis not present

## 2019-10-16 DIAGNOSIS — R972 Elevated prostate specific antigen [PSA]: Secondary | ICD-10-CM | POA: Diagnosis not present

## 2019-11-13 DIAGNOSIS — E1169 Type 2 diabetes mellitus with other specified complication: Secondary | ICD-10-CM | POA: Diagnosis not present

## 2019-11-13 DIAGNOSIS — L97509 Non-pressure chronic ulcer of other part of unspecified foot with unspecified severity: Secondary | ICD-10-CM | POA: Diagnosis not present

## 2019-11-13 DIAGNOSIS — I10 Essential (primary) hypertension: Secondary | ICD-10-CM | POA: Diagnosis not present

## 2019-11-20 DIAGNOSIS — E1169 Type 2 diabetes mellitus with other specified complication: Secondary | ICD-10-CM | POA: Diagnosis not present

## 2019-11-20 DIAGNOSIS — L97509 Non-pressure chronic ulcer of other part of unspecified foot with unspecified severity: Secondary | ICD-10-CM | POA: Diagnosis not present

## 2019-11-23 ENCOUNTER — Other Ambulatory Visit: Payer: Self-pay | Admitting: Sports Medicine

## 2019-11-23 DIAGNOSIS — M48061 Spinal stenosis, lumbar region without neurogenic claudication: Secondary | ICD-10-CM | POA: Diagnosis not present

## 2019-11-27 ENCOUNTER — Other Ambulatory Visit: Payer: Self-pay | Admitting: Cardiology

## 2019-11-27 DIAGNOSIS — R062 Wheezing: Secondary | ICD-10-CM | POA: Diagnosis not present

## 2019-11-27 DIAGNOSIS — J3089 Other allergic rhinitis: Secondary | ICD-10-CM | POA: Diagnosis not present

## 2019-11-27 DIAGNOSIS — H1045 Other chronic allergic conjunctivitis: Secondary | ICD-10-CM | POA: Diagnosis not present

## 2019-11-27 DIAGNOSIS — K219 Gastro-esophageal reflux disease without esophagitis: Secondary | ICD-10-CM | POA: Diagnosis not present

## 2019-11-30 ENCOUNTER — Ambulatory Visit
Admission: RE | Admit: 2019-11-30 | Discharge: 2019-11-30 | Disposition: A | Discharge status: Home / Self Care | Payer: PPO | Source: Ambulatory Visit | Attending: Sports Medicine | Admitting: Sports Medicine

## 2019-11-30 ENCOUNTER — Other Ambulatory Visit: Payer: Self-pay

## 2019-11-30 DIAGNOSIS — M48061 Spinal stenosis, lumbar region without neurogenic claudication: Secondary | ICD-10-CM

## 2019-11-30 MED ORDER — IOPAMIDOL (ISOVUE-M 200) INJECTION 41%
1.0000 mL | Freq: Once | INTRAMUSCULAR | Status: AC
  Administered 2019-11-30: 1 mL via EPIDURAL

## 2019-11-30 MED ORDER — METHYLPREDNISOLONE ACETATE 40 MG/ML INJ SUSP (RADIOLOG
120.0000 mg | Freq: Once | INTRAMUSCULAR | Status: AC
  Administered 2019-11-30: 120 mg via EPIDURAL

## 2019-11-30 NOTE — Discharge Instructions (Signed)

## 2019-12-12 DIAGNOSIS — H524 Presbyopia: Secondary | ICD-10-CM | POA: Diagnosis not present

## 2019-12-19 NOTE — Progress Notes (Signed)
Cardiology Office Note    Date:  12/26/2019   ID:  Jackson, Alex Mar 22, 1946, MRN [id number]  PCP:  [name], MD  Cardiologist: Tobias Alexander, MD EPS: None  Chief Complaint  Patient presents with   Leg Pain    History of Present Illness:  Alex Jackson is a 74 y.o. male with a history of CAD status post NSTEMI treated with stent to the LAD in 2016 EF at that time 34 to 50% that normalized.  He had another NSTEMI 09/26/2017 treated with stenting of the distal RCA into the PL OM and balloon angioplasty of the PDA through the stent struts.  LVEF 55 to 60% with grade 1 DD on 2D echo at that time.  Patient also has chronic diastolic CHF, hypertension, DM and hyperlipidemia.  Patient last saw Dr. Delton See 06/26/2019 at which time he was having palpitations and 1 week Zio patch monitor ordered.  This was never done.  Patient referred by [facility], ortho, because of complaints of tightness along the lateral left leg if he walks for very long and feels like he is walking in cement. Calves get tight weakness in the legs. Also has an ulcer on his right great toe. He has lumbar spinal stenosis with worsening symptoms but they want to rule out claudication as well.  Patient thinks his symptoms are all coming from his lower back. Takes a diuretic once a week for edema. Feels weight in his legs. Calves get tight when he walks especially when he does a lot of walking and driving. BP up today but just took meds. BP usually runs 120-130. 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 Has a trip to [place] in Oct.  Past Medical History:  Diagnosis Date   CAD in native artery 03/01/2015   1. cath - DES to LAD residual non obstructive RCA disease in 2016 b. Cath s/p Successful PCI with stenting of the distal RCA into the PLOM with a DES. Balloon angioplasty of the PDA through  the stent struts.    Edema of both lower extremities    Hyperlipidemia    Hypertension    Leg weakness    Overweight    Spinal stenosis    Ulcer of great toe [facility])     Past Surgical History:  Procedure Laterality Date   CARDIAC CATHETERIZATION N/A 03/01/2015   Procedure: Left Heart Cath and Coronary Angiography;  Surgeon: Kathleene Hazel, MD;  Location: [facility] INVASIVE CV LAB;  Service: Cardiovascular;  Laterality: N/A;   CORONARY STENT INTERVENTION N/A 09/28/2017   Procedure: CORONARY STENT INTERVENTION;  Surgeon: Swaziland, Peter M, MD;  Location: [facility] INVASIVE CV LAB;  Service: Cardiovascular;  Laterality: N/A;   LEFT HEART CATH AND CORONARY ANGIOGRAPHY N/A 09/28/2017   Procedure: LEFT HEART CATH AND CORONARY ANGIOGRAPHY;  Surgeon: Swaziland, Peter M, MD;  Location: [facility] INVASIVE CV LAB;  Service: Cardiovascular;  Laterality: N/A;   NO PAST SURGERIES      Current Medications: Current Meds  Medication Sig   acetaminophen (TYLENOL) 325 MG tablet Take 2 tablets (650 mg total) by mouth every 4 (four) hours as needed for headache or mild pain.   aspirin EC 81 MG tablet Take 81 mg by mouth daily.   bisoprolol (ZEBETA) 10 MG tablet TAKE 1 TABLET BY MOUTH EVERY DAY   chlorthalidone (HYGROTON)  25 MG tablet Take 0.5 tablets by mouth daily.   DEXILANT 60 MG capsule Take 1 capsule by mouth daily.   fluticasone (FLONASE) 50 MCG/ACT nasal spray USE 1 SPRAY IN EACH NOSTRIL EVERY DAY   furosemide (LASIX) 20 MG tablet TAKE 1 TABLET (20 MG TOTAL) BY MOUTH DAILY AS NEEDED. SWELLING   hydroxypropyl methylcellulose / hypromellose (ISOPTO TEARS / GONIOVISC) 2.5 % ophthalmic solution Place 1 drop into both eyes as needed for dry eyes.   lidocaine (LIDODERM) 5 % Place 1 patch onto the skin as needed.   lisinopril (ZESTRIL) 20 MG tablet TAKE 1 TABLET BY MOUTH EVERY DAY   loteprednol (LOTEMAX) 0.5 % ophthalmic suspension    metFORMIN (GLUCOPHAGE) 500 MG tablet Take 1 tablet (500 mg total)  by mouth 2 (two) times daily.   montelukast (SINGULAIR) 10 MG tablet Take 10 mg by mouth daily.   Multiple Vitamin (MULTIVITAMIN WITH MINERALS) TABS tablet Take 1 tablet by mouth daily.   nitroGLYCERIN (NITROSTAT) 0.4 MG SL tablet Place 1 tablet (0.4 mg total) under the tongue every 5 (five) minutes x 3 doses as needed for chest pain.   omega-3 acid ethyl esters (LOVAZA) 1 g capsule TAKE 1 CAPSULE (1 G TOTAL) BY MOUTH 2 (TWO) TIMES DAILY.   rosuvastatin (CRESTOR) 10 MG tablet Take 1 tablet (10 mg total) by mouth daily.     Allergies:   Prednisone, Atorvastatin, and Pravastatin   Social History   Socioeconomic History   Marital status: Divorced    Spouse name: Not on file   Number of children: Not on file   Years of education: Not on file   Highest education level: Not on file  Occupational History   Not on file  Tobacco Use   Smoking status: Former Smoker    Packs/day: 1.00    Quit date: 07/03/1983    Years since quitting: 36.5   Smokeless tobacco: Never Used   Tobacco comment: quit in Mar 1985  Vaping Use   Vaping Use: Never used  Substance and Sexual Activity   Alcohol use: Yes    Alcohol/week: 0.0 standard drinks    Comment: "one or two drinks a night"   Drug use: No   Sexual activity: Not on file  Other Topics Concern   Not on file  Social History Narrative   Not on file   Social Determinants of Health   Financial Resource Strain:    Difficulty of Paying Living Expenses: Not on file  Food Insecurity:    Worried About Running Out of Food in the Last Year: Not on file   Ran Out of Food in the Last Year: Not on file  Transportation Needs:    Lack of Transportation (Medical): Not on file   Lack of Transportation (Non-Medical): Not on file  Physical Activity:    Days of Exercise per Week: Not on file   Minutes of Exercise per Session: Not on file  Stress:    Feeling of Stress : Not on file  Social Connections:    Frequency of  Communication with Friends and Family: Not on file   Frequency of Social Gatherings with Friends and Family: Not on file   Attends Religious Services: Not on file   Active Member of Clubs or Organizations: Not on file   Attends BankerClub or Organization Meetings: Not on file   Marital Status: Not on file     Family History:  The patient's family history includes Arrhythmia in his sister; Liver cancer  in his father; Thyroid cancer in his mother.   ROS:   Please see the history of present illness.    ROS All other systems reviewed and are negative.   PHYSICAL EXAM:   VS:  BP (!) 158/88    Pulse 69    Ht 5' 11.5" (1.816 m)    Wt 279 lb 12.8 oz (126.9 kg)    SpO2 97%    BMI 38.48 kg/m   Physical Exam  GEN: Obese, in no acute distress  Neck: no JVD, carotid bruits, or masses Cardiac:RRR; no murmurs, rubs, or gallops  Respiratory: decreased breath sounds but clear to auscultation bilaterally, normal work of breathing GI: soft, nontender, nondistended, + BS Ext: Right lower extremity edema, decreased dorsalis pedis and posterior tibial pulses bilaterally Neuro:  Alert and Oriented x 3 Psych: euthymic mood, full affect  Wt Readings from Last 3 Encounters:  12/26/19 279 lb 12.8 oz (126.9 kg)  06/26/19 273 lb 12.8 oz (124.2 kg)  08/09/18 247 lb 6.4 oz (112.2 kg)      Studies/Labs Reviewed:   EKG:  EKG is not ordered today.    Recent Labs: 06/26/2019: ALT 31; BUN 13; Creatinine, Ser 0.92; Hemoglobin 13.8; Platelets 216; Potassium 4.2; Sodium 141; TSH 1.760   Lipid Panel    Component Value Date/Time   CHOL 141 06/26/2019 0845   TRIG 242 (H) 06/26/2019 0845   HDL 32 (L) 06/26/2019 0845   CHOLHDL 4.4 06/26/2019 0845   CHOLHDL 5.4 09/27/2017 0222   VLDL UNABLE TO CALCULATE IF TRIGLYCERIDE OVER 400 mg/dL 16/02/9603 5409   LDLCALC 69 06/26/2019 0845    Additional studies/ records that were reviewed today include:     Cardiac cath 09/28/2017 Conclusion       Previously placed  Prox LAD drug eluting stent is widely patent.  Mid RCA to Dist RCA lesion is 30% stenosed.  RPDA lesion is 20% stenosed.  Dist RCA lesion is 90% stenosed with 99% stenosed side branch in Post Atrio.  A drug-eluting stent was successfully placed using a STENT SYNERGY DES 3.5X16.  Post intervention, there is a 0% residual stenosis.  Post intervention, the side branch was reduced to 0% residual stenosis.  Ost RPDA lesion is 60% stenosed.  Balloon angioplasty was performed.  Post intervention, there is a 0% residual stenosis.  LV end diastolic pressure is normal.   1. Single vessel obstructive CAD  2. Patent stent in the LAD 3. Normal LVEDP 4. Successful PCI with stenting of the distal RCA into the PLOM with a DES. Balloon angioplasty of the PDA through the stent struts.    Plan: DAPT for one year. Risk factor modification. Anticipate DC in am.      Echo 09/27/17 Study Conclusions   - Left ventricle: The cavity size was normal. Wall thickness was   increased in a pattern of mild LVH. Systolic function was normal.   The estimated ejection fraction was in the range of 55% to 60%.   Wall motion was normal; there were no regional wall motion   abnormalities. Doppler parameters are consistent with abnormal   left ventricular relaxation (grade 1 diastolic dysfunction). - Mitral valve: Valve area by pressure half-time: 1.67 cm^2.   Impressions:   - Normal LV systolic function; mild diastolic dysfunction; mild   LVH; trace MR and TR.      ASSESSMENT:    1. Claudication in peripheral vascular disease (HCC)   2. Coronary artery disease involving native coronary artery of native  heart without angina pectoris   3. Edema of right lower extremity   4. Chronic diastolic CHF (congestive heart failure) (HCC)   5. Essential hypertension   6. Hyperlipidemia, unspecified hyperlipidemia type   7. Morbid obesity (HCC)      PLAN:  In order of problems listed above:  Claudication  symptoms with walking but also has spinal stenosis and he thinks it is from this.  He does have decreased arterial pulses bilaterally.  Will check arterial Dopplers incentive VVS if indicated.  Right lower extremity edema we will check venous Dopplers to rule out DVT.  Edema diet take Lasix when he gets home today  CAD status post NSTEMI treated with stent to the LAD in 2016 EF at that time 34 to 50% that normalized. He had another NSTEMI 09/26/2017 treated with stenting of the distal RCA into the [facility] and balloon angioplasty of the PDA through the stent struts. LVEF 55 to 60% with grade 1 DD on 2D echo at that time.  On aspirin.  No angina  Chronic diastolic CHF-patient takes Lasix once a week but may need to take it more frequently.  2 g sodium diet.  Essential hypertension blood pressure up today but he restrict his medications.  Says it usually 120-130 systolic.  Hyperlipidemia on Crestor and Lovaza labs 09/04/2019 LDL 76 triglycerides 192  Obesity referred to medical weight loss center.    Medication Adjustments/Labs and Tests Ordered: Current medicines are reviewed at length with the patient today.  Concerns regarding medicines are outlined above.  Medication changes, Labs and Tests ordered today are listed in the Patient Instructions below. Patient Instructions   Medication Instructions:  Your physician recommends that you continue on your current medications as directed. Please refer to the Current Medication list given to you today.  *If you need a refill on your cardiac medications before your next appointment, please call your pharmacy*   Lab Work: None If you have labs (blood work) drawn today and your tests are completely normal, you will receive your results only by:  MyChart Message (if you have MyChart) OR  A paper copy in the mail If you have any lab test that is abnormal or we need to change your treatment, we will call you to review the  results.   Testing/Procedures: Your physician has requested that you have a lower extremity venous duplex. This test is an ultrasound of the veins in the legs or arms. It looks at venous blood flow that carries blood from the heart to the legs. Allow one hour for a Lower Venous exam. There are no restrictions or special instructions.    Follow-Up: At [facility], you and your health needs are our priority.  As part of our continuing mission to provide you with exceptional heart care, we have created designated Provider Care Teams.  These Care Teams include your primary Cardiologist (physician) and Advanced Practice Providers (APPs -  Physician Assistants and Nurse Practitioners) who all work together to provide you with the care you need, when you need it.  We recommend signing up for the patient portal called "MyChart".  Sign up information is provided on this After Visit Summary.  MyChart is used to connect with patients for Virtual Visits (Telemedicine).  Patients are able to view lab/test results, encounter notes, upcoming appointments, etc.  Non-urgent messages can be sent to your provider as well.   To learn more about what you can do with MyChart, go to [email or web address].  Your next appointment:   6 month(s)  The format for your next appointment:   In Person  Provider:   You may see Tobias Alexander, MD or one of the following Advanced Practice Providers on your designated Care Team:    Ronie Spies, PA-C  Jacolyn Reedy, PA-C    Other Instructions You have been referred to Weight Loss Management. They will contact you to schedule an appointment.   Two Gram Sodium Diet 2000 mg  What is Sodium? Sodium is a mineral found naturally in many foods. The most significant source of sodium in the diet is table salt, which is about 40% sodium.  Processed, convenience, and preserved foods also contain a large amount of sodium.  The body needs only 500 mg of sodium daily to  function,  A normal diet provides more than enough sodium even if you do not use salt.  Why Limit Sodium? A build up of sodium in the body can cause thirst, increased blood pressure, shortness of breath, and water retention.  Decreasing sodium in the diet can reduce edema and risk of heart attack or stroke associated with high blood pressure.  Keep in mind that there are many other factors involved in these health problems.  Heredity, obesity, lack of exercise, cigarette smoking, stress and what you eat all play a role.  General Guidelines:  Do not add salt at the table or in cooking.  One teaspoon of salt contains over 2 grams of sodium.  Read food labels  Avoid processed and convenience foods  Ask your dietitian before eating any foods not dicussed in the menu planning guidelines  Consult your physician if you wish to use a salt substitute or a sodium containing medication such as antacids.  Limit milk and milk products to 16 oz (2 cups) per day.  Shopping Hints:  READ LABELS!! "Dietetic" does not necessarily mean low sodium.  Salt and other sodium ingredients are often added to foods during processing.   Menu Planning Guidelines Food Group Choose More Often Avoid  Beverages (see also the milk group All fruit juices, low-sodium, salt-free vegetables juices, low-sodium carbonated beverages Regular vegetable or tomato juices, commercially softened water used for drinking or cooking  Breads and Cereals Enriched white, wheat, rye and pumpernickel bread, hard rolls and dinner rolls; muffins, cornbread and waffles; most dry cereals, cooked cereal without added salt; unsalted crackers and breadsticks; low sodium or homemade bread crumbs Bread, rolls and crackers with salted tops; quick breads; instant hot cereals; pancakes; commercial bread stuffing; self-rising flower and biscuit mixes; regular bread crumbs or cracker crumbs  Desserts and Sweets Desserts and sweets mad with mild should be  within allowance Instant pudding mixes and cake mixes  Fats Butter or margarine; vegetable oils; unsalted salad dressings, regular salad dressings limited to 1 Tbs; light, sour and heavy cream Regular salad dressings containing bacon fat, bacon bits, and salt pork; snack dips made with instant soup mixes or processed cheese; salted nuts  Fruits Most fresh, frozen and canned fruits Fruits processed with salt or sodium-containing ingredient (some dried fruits are processed with sodium sulfites        Vegetables Fresh, frozen vegetables and low- sodium canned vegetables Regular canned vegetables, sauerkraut, pickled vegetables, and others prepared in brine; frozen vegetables in sauces; vegetables seasoned with ham, bacon or salt pork  Condiments, Sauces, Miscellaneous  Salt substitute with physician's approval; pepper, herbs, spices; vinegar, lemon or lime juice; hot pepper sauce; garlic powder, onion powder, low sodium soy  sauce (1 Tbs.); low sodium condiments (ketchup, chili sauce, mustard) in limited amounts (1 tsp.) fresh ground horseradish; unsalted tortilla chips, pretzels, potato chips, popcorn, salsa (1/4 cup) Any seasoning made with salt including garlic salt, celery salt, onion salt, and seasoned salt; sea salt, rock salt, kosher salt; meat tenderizers; monosodium glutamate; mustard, regular soy sauce, barbecue, sauce, chili sauce, teriyaki sauce, steak sauce, Worcestershire sauce, and most flavored vinegars; canned gravy and mixes; regular condiments; salted snack foods, olives, picles, relish, horseradish sauce, catsup   Food preparation: Try these seasonings Meats:    Pork Sage, onion Serve with applesauce  Chicken Poultry seasoning, thyme, parsley Serve with cranberry sauce  Lamb Curry powder, rosemary, garlic, thyme Serve with mint sauce or jelly  Veal Marjoram, basil Serve with current jelly, cranberry sauce  Beef Pepper, bay leaf Serve with dry mustard, unsalted chive butter  Fish  Bay leaf, dill Serve with unsalted lemon butter, unsalted parsley butter  Vegetables:    Asparagus Lemon juice   Broccoli Lemon juice   Carrots Mustard dressing parsley, mint, nutmeg, glazed with unsalted butter and sugar   Green beans Marjoram, lemon juice, nutmeg,dill seed   Tomatoes Basil, marjoram, onion   Spice /blend for [employer]" 4 tsp ground thyme 1 tsp ground sage 3 tsp ground rosemary 4 tsp ground marjoram   Test your knowledge 1. A product that says "Salt Free" may still contain sodium. True or False 2. Garlic Powder and Hot Pepper Sauce an be used as alternative seasonings.True or False 3. Processed foods have more sodium than fresh foods.  True or False 4. Canned Vegetables have less sodium than froze True or False  WAYS TO DECREASE YOUR SODIUM INTAKE 1. Avoid the use of added salt in cooking and at the table.  Table salt (and other prepared seasonings which contain salt) is probably one of the greatest sources of sodium in the diet.  Unsalted foods can gain flavor from the sweet, sour, and butter taste sensations of herbs and spices.  Instead of using salt for seasoning, try the following seasonings with the foods listed.  Remember: how you use them to enhance natural food flavors is limited only by your creativity... Allspice-Meat, fish, eggs, fruit, peas, red and yellow vegetables Almond Extract-Fruit baked goods Anise Seed-Sweet breads, fruit, carrots, beets, cottage cheese, cookies (tastes like licorice) Basil-Meat, fish, eggs, vegetables, rice, vegetables salads, soups, sauces Bay Leaf-Meat, fish, stews, poultry Burnet-Salad, vegetables (cucumber-like flavor) Caraway Seed-Bread, cookies, cottage cheese, meat, vegetables, cheese, rice Cardamon-Baked goods, fruit, soups Celery Powder or seed-Salads, salad dressings, sauces, meatloaf, soup, bread.Do not use  celery salt Chervil-Meats, salads, fish, eggs, vegetables, cottage cheese (parsley-like flavor) Chili  Power-Meatloaf, chicken cheese, corn, eggplant, egg dishes Chives-Salads cottage cheese, egg dishes, soups, vegetables, sauces Cilantro-Salsa, casseroles Cinnamon-Baked goods, fruit, pork, lamb, chicken, carrots Cloves-Fruit, baked goods, fish, pot roast, green beans, beets, carrots Coriander-Pastry, cookies, meat, salads, cheese (lemon-orange flavor) Cumin-Meatloaf, fish,cheese, eggs, cabbage,fruit pie (caraway flavor) [employer], fruit, eggs, fish, poultry, cottage cheese, vegetables Dill Seed-Meat, cottage cheese, poultry, vegetables, fish, salads, bread Fennel Seed-Bread, cookies, apples, pork, eggs, fish, beets, cabbage, cheese, Licorice-like flavor Garlic-(buds or powder) Salads, meat, poultry, fish, bread, butter, vegetables, potatoes.Do not  use garlic salt Ginger-Fruit, vegetables, baked goods, meat, fish, poultry Horseradish Root-Meet, vegetables, butter Lemon Juice or Extract-Vegetables, fruit, tea, baked goods, fish salads Mace-Baked goods fruit, vegetables, fish, poultry (taste like nutmeg) Maple Extract-Syrups Marjoram-Meat, chicken, fish, vegetables, breads, green salads (taste like Sage) Mint-Tea, lamb, sherbet, vegetables, desserts,  carrots, cabbage Mustard, Dry or Seed-Cheese, eggs, meats, vegetables, poultry Nutmeg-Baked goods, fruit, chicken, eggs, vegetables, desserts Onion Powder-Meat, fish, poultry, vegetables, cheese, eggs, bread, rice salads (Do not use   Onion salt) Orange Extract-Desserts, baked goods Oregano-Pasta, eggs, cheese, onions, pork, lamb, fish, chicken, vegetables, green salads Paprika-Meat, fish, poultry, eggs, cheese, vegetables Parsley Flakes-Butter, vegetables, meat fish, poultry, eggs, bread, salads (certain forms may   Contain sodium Pepper-Meat fish, poultry, vegetables, eggs Peppermint Extract-Desserts, baked goods Poppy Seed-Eggs, bread, cheese, fruit dressings, baked goods, noodles, vegetables, cottage  [place], poultry, meat, fish, cauliflower, turnips,eggs bread Saffron-Rice, bread, veal, chicken, fish, eggs Sage-Meat, fish, poultry, onions, eggplant, tomateos, pork, stews Savory-Eggs, salads, poultry, meat, rice, vegetables, soups, pork Tarragon-Meat, poultry, fish, eggs, butter, vegetables (licorice-like flavor)  Thyme-Meat, poultry, fish, eggs, vegetables, (clover-like flavor), sauces, soups Tumeric-Salads, butter, eggs, fish, rice, vegetables (saffron-like flavor) Vanilla Extract-Baked goods, candy Vinegar-Salads, vegetables, meat marinades Walnut Extract-baked goods, candy  2. Choose your Foods Wisely   The following is a list of foods to avoid which are high in sodium:  Meats-Avoid all smoked, canned, salt cured, dried and kosher meat and fish as well as Anchovies   [employer] meats:Bologna, Liverwurst, Pastrami Canned meat or fish  Marinated herring Caviar    Pepperoni Corned Beef   Pizza Dried chipped beef  Salami Frozen breaded fish or meat Salt pork Frankfurters or hot dogs  Sardines Gefilte fish   Sausage Ham (boiled ham, Proscuitto Smoked butt    spiced ham)   Spam      TV Dinners Vegetables Canned vegetables (Regular) Relish Canned mushrooms  Sauerkraut Olives    Tomato juice Pickles  Bakery and Dessert Products Canned puddings  Cream pies Cheesecake   Decorated cakes Cookies  Beverages/Juices Tomato juice, regular  Gatorade   V-8 vegetable juice, regular  Breads and Cereals Biscuit mixes   Salted potato chips, corn chips, pretzels Bread stuffing mixes  Salted crackers and rolls Pancake and waffle mixes Self-rising flour  Seasonings Accent    Meat sauces Barbecue sauce  Meat tenderizer Catsup    Monosodium glutamate (MSG) Celery salt   Onion salt Chili sauce   Prepared mustard Garlic salt   Salt, seasoned salt, sea salt Gravy mixes   Soy sauce Horseradish   Steak sauce Ketchup   Tartar sauce Lite  salt    Teriyaki sauce Marinade mixes   Worcestershire sauce  Others Baking powder   Cocoa and cocoa mixes Baking soda   Commercial casserole mixes Candy-caramels, chocolate  Dehydrated soups    Bars, fudge,nougats  Instant rice and pasta mixes Canned broth or soup  Maraschino cherries Cheese, aged and processed cheese and cheese spreads  Learning Assessment Quiz  Indicated T (for True) or F (for False) for each of the following statements:  1. _____ Fresh fruits and vegetables and unprocessed grains are generally low in sodium 2. _____ Water may contain a considerable amount of sodium, depending on the source 3. _____ You can always tell if a food is high in sodium by tasting it 4. _____ Certain laxatives my be high in sodium and should be avoided unless prescribed   by a physician or pharmacist 5. _____ Salt substitutes may be used freely by anyone on a sodium restricted diet 6. _____ Sodium is present in table salt, food additives and as a natural component of   most foods 7. _____ Table salt is approximately 90% sodium 8. _____ Limiting sodium intake  may help prevent excess fluid accumulation in the body 9. _____ On a sodium-restricted diet, seasonings such as bouillon soy sauce, and    cooking wine should be used in place of table salt 10. _____ On an ingredient list, a product which lists monosodium glutamate as the first   ingredient is an appropriate food to include on a low sodium diet  Circle the best answer(s) to the following statements (Hint: there may be more than one correct answer)  11. On a low-sodium diet, some acceptable snack items are:    A. Olives  F. Bean dip   K. Grapefruit juice    B. Salted Pretzels G. Commercial Popcorn   L. Canned peaches    C. Carrot Sticks  H. Bouillon   M. Unsalted nuts   D. [place] fries  I. Peanut butter crackers N. Salami   E. Sweet pickles J. Tomato Juice   O. Pizza  12.  Seasonings that may be used freely on a reduced -  sodium diet include   A. Lemon wedges F.Monosodium glutamate K. Celery seed    B.Soysauce   G. Pepper   L. Mustard powder   C. Sea salt  H. Cooking wine  M. Onion flakes   D. Vinegar  E. Prepared horseradish N. Salsa   E. Sage   J. Worcestershire sauce  O. [address]      Elson Clan, PA-C  12/26/2019 8:16 AM    [facility] [address] [place], [place], [place]  [address] Phone: [phone]; Fax: [phone]

## 2019-12-21 ENCOUNTER — Telehealth: Payer: Self-pay | Admitting: Cardiology

## 2019-12-21 NOTE — Telephone Encounter (Addendum)
Pt is calling in asking if it is recommended that he receive the covid booster vaccine when it is available for him to get this.  Pt states he will be due around the time he leaves to go to [place].  Advised the pt that yes, he should get the booster vaccine if he chooses to do so.  I advised him that there is not specific information as to where the boosters will be given to patients, when the time comes.  Informed him he would need to follow the dates provided by the CDC and FDA as to when he will be due to get the booster.  Advised him to frequently check his mychart, the [employer], and his local news, for further announcements and information about booster vaccine sites and administration timeline.  Pt verbalized understanding and agrees with this plan. Pt appreciative for all the assistance provided.

## 2019-12-21 NOTE — Telephone Encounter (Signed)
Patient is calling to discuss whether or not he will need the Covid-19 booster vaccination. He states he became fully vaccinated in February so he assumes he will need the booster shot in October. However he is planning to go to [place] on 02/03/20. Please advise.

## 2019-12-26 ENCOUNTER — Encounter: Payer: Self-pay | Admitting: Physician Assistant

## 2019-12-26 ENCOUNTER — Other Ambulatory Visit: Payer: Self-pay

## 2019-12-26 ENCOUNTER — Ambulatory Visit: Payer: PPO | Admitting: Physician Assistant

## 2019-12-26 VITALS — BP 158/88 | HR 69 | Ht 71.5 in | Wt 279.8 lb

## 2019-12-26 DIAGNOSIS — I10 Essential (primary) hypertension: Secondary | ICD-10-CM

## 2019-12-26 DIAGNOSIS — I739 Peripheral vascular disease, unspecified: Secondary | ICD-10-CM | POA: Diagnosis not present

## 2019-12-26 DIAGNOSIS — E785 Hyperlipidemia, unspecified: Secondary | ICD-10-CM

## 2019-12-26 DIAGNOSIS — I2510 Atherosclerotic heart disease of native coronary artery without angina pectoris: Secondary | ICD-10-CM

## 2019-12-26 DIAGNOSIS — R600 Localized edema: Secondary | ICD-10-CM | POA: Diagnosis not present

## 2019-12-26 DIAGNOSIS — I5032 Chronic diastolic (congestive) heart failure: Secondary | ICD-10-CM | POA: Diagnosis not present

## 2019-12-26 NOTE — Patient Instructions (Signed)
Medication Instructions:  Your physician recommends that you continue on your current medications as directed. Please refer to the Current Medication list given to you today.  *If you need a refill on your cardiac medications before your next appointment, please call your pharmacy*   Lab Work: None If you have labs (blood work) drawn today and your tests are completely normal, you will receive your results only by:  MyChart Message (if you have MyChart) OR  A paper copy in the mail If you have any lab test that is abnormal or we need to change your treatment, we will call you to review the results.   Testing/Procedures: Your physician has requested that you have a lower extremity venous duplex. This test is an ultrasound of the veins in the legs or arms. It looks at venous blood flow that carries blood from the heart to the legs. Allow one hour for a Lower Venous exam. There are no restrictions or special instructions.    Follow-Up: At [facility], you and your health needs are our priority.  As part of our continuing mission to provide you with exceptional heart care, we have created designated Provider Care Teams.  These Care Teams include your primary Cardiologist (physician) and Advanced Practice Providers (APPs -  Physician Assistants and Nurse Practitioners) who all work together to provide you with the care you need, when you need it.  We recommend signing up for the patient portal called "MyChart".  Sign up information is provided on this After Visit Summary.  MyChart is used to connect with patients for Virtual Visits (Telemedicine).  Patients are able to view lab/test results, encounter notes, upcoming appointments, etc.  Non-urgent messages can be sent to your provider as well.   To learn more about what you can do with MyChart, go to [email or web address].    Your next appointment:   6 month(s)  The format for your next appointment:   In Person  Provider:   You may  see Tobias Alexander, MD or one of the following Advanced Practice Providers on your designated Care Team:    Ronie Spies, PA-C  Jacolyn Reedy, PA-C    Other Instructions You have been referred to Weight Loss Management. They will contact you to schedule an appointment.   Two Gram Sodium Diet 2000 mg  What is Sodium? Sodium is a mineral found naturally in many foods. The most significant source of sodium in the diet is table salt, which is about 40% sodium.  Processed, convenience, and preserved foods also contain a large amount of sodium.  The body needs only 500 mg of sodium daily to function,  A normal diet provides more than enough sodium even if you do not use salt.  Why Limit Sodium? A build up of sodium in the body can cause thirst, increased blood pressure, shortness of breath, and water retention.  Decreasing sodium in the diet can reduce edema and risk of heart attack or stroke associated with high blood pressure.  Keep in mind that there are many other factors involved in these health problems.  Heredity, obesity, lack of exercise, cigarette smoking, stress and what you eat all play a role.  General Guidelines:  Do not add salt at the table or in cooking.  One teaspoon of salt contains over 2 grams of sodium.  Read food labels  Avoid processed and convenience foods  Ask your dietitian before eating any foods not dicussed in the menu planning guidelines  Consult your physician if  you wish to use a salt substitute or a sodium containing medication such as antacids.  Limit milk and milk products to 16 oz (2 cups) per day.  Shopping Hints:  READ LABELS!! "Dietetic" does not necessarily mean low sodium.  Salt and other sodium ingredients are often added to foods during processing.   Menu Planning Guidelines Food Group Choose More Often Avoid  Beverages (see also the milk group All fruit juices, low-sodium, salt-free vegetables juices, low-sodium carbonated beverages Regular  vegetable or tomato juices, commercially softened water used for drinking or cooking  Breads and Cereals Enriched white, wheat, rye and pumpernickel bread, hard rolls and dinner rolls; muffins, cornbread and waffles; most dry cereals, cooked cereal without added salt; unsalted crackers and breadsticks; low sodium or homemade bread crumbs Bread, rolls and crackers with salted tops; quick breads; instant hot cereals; pancakes; commercial bread stuffing; self-rising flower and biscuit mixes; regular bread crumbs or cracker crumbs  Desserts and Sweets Desserts and sweets mad with mild should be within allowance Instant pudding mixes and cake mixes  Fats Butter or margarine; vegetable oils; unsalted salad dressings, regular salad dressings limited to 1 Tbs; light, sour and heavy cream Regular salad dressings containing bacon fat, bacon bits, and salt pork; snack dips made with instant soup mixes or processed cheese; salted nuts  Fruits Most fresh, frozen and canned fruits Fruits processed with salt or sodium-containing ingredient (some dried fruits are processed with sodium sulfites        Vegetables Fresh, frozen vegetables and low- sodium canned vegetables Regular canned vegetables, sauerkraut, pickled vegetables, and others prepared in brine; frozen vegetables in sauces; vegetables seasoned with ham, bacon or salt pork  Condiments, Sauces, Miscellaneous  Salt substitute with physician's approval; pepper, herbs, spices; vinegar, lemon or lime juice; hot pepper sauce; garlic powder, onion powder, low sodium soy sauce (1 Tbs.); low sodium condiments (ketchup, chili sauce, mustard) in limited amounts (1 tsp.) fresh ground horseradish; unsalted tortilla chips, pretzels, potato chips, popcorn, salsa (1/4 cup) Any seasoning made with salt including garlic salt, celery salt, onion salt, and seasoned salt; sea salt, rock salt, kosher salt; meat tenderizers; monosodium glutamate; mustard, regular soy sauce,  barbecue, sauce, chili sauce, teriyaki sauce, steak sauce, Worcestershire sauce, and most flavored vinegars; canned gravy and mixes; regular condiments; salted snack foods, olives, picles, relish, horseradish sauce, catsup   Food preparation: Try these seasonings Meats:    Pork Sage, onion Serve with applesauce  Chicken Poultry seasoning, thyme, parsley Serve with cranberry sauce  Lamb Curry powder, rosemary, garlic, thyme Serve with mint sauce or jelly  Veal Marjoram, basil Serve with current jelly, cranberry sauce  Beef Pepper, bay leaf Serve with dry mustard, unsalted chive butter  Fish Bay leaf, dill Serve with unsalted lemon butter, unsalted parsley butter  Vegetables:    Asparagus Lemon juice   Broccoli Lemon juice   Carrots Mustard dressing parsley, mint, nutmeg, glazed with unsalted butter and sugar   Green beans Marjoram, lemon juice, nutmeg,dill seed   Tomatoes Basil, marjoram, onion   Spice /blend for [employer]" 4 tsp ground thyme 1 tsp ground sage 3 tsp ground rosemary 4 tsp ground marjoram   Test your knowledge 1. A product that says "Salt Free" may still contain sodium. True or False 2. Garlic Powder and Hot Pepper Sauce an be used as alternative seasonings.True or False 3. Processed foods have more sodium than fresh foods.  True or False 4. Canned Vegetables have less sodium than froze True or  False  WAYS TO DECREASE YOUR SODIUM INTAKE 1. Avoid the use of added salt in cooking and at the table.  Table salt (and other prepared seasonings which contain salt) is probably one of the greatest sources of sodium in the diet.  Unsalted foods can gain flavor from the sweet, sour, and butter taste sensations of herbs and spices.  Instead of using salt for seasoning, try the following seasonings with the foods listed.  Remember: how you use them to enhance natural food flavors is limited only by your creativity... Allspice-Meat, fish, eggs, fruit, peas, red and yellow  vegetables Almond Extract-Fruit baked goods Anise Seed-Sweet breads, fruit, carrots, beets, cottage cheese, cookies (tastes like licorice) Basil-Meat, fish, eggs, vegetables, rice, vegetables salads, soups, sauces Bay Leaf-Meat, fish, stews, poultry Burnet-Salad, vegetables (cucumber-like flavor) Caraway Seed-Bread, cookies, cottage cheese, meat, vegetables, cheese, rice Cardamon-Baked goods, fruit, soups Celery Powder or seed-Salads, salad dressings, sauces, meatloaf, soup, bread.Do not use  celery salt Chervil-Meats, salads, fish, eggs, vegetables, cottage cheese (parsley-like flavor) Chili Power-Meatloaf, chicken cheese, corn, eggplant, egg dishes Chives-Salads cottage cheese, egg dishes, soups, vegetables, sauces Cilantro-Salsa, casseroles Cinnamon-Baked goods, fruit, pork, lamb, chicken, carrots Cloves-Fruit, baked goods, fish, pot roast, green beans, beets, carrots Coriander-Pastry, cookies, meat, salads, cheese (lemon-orange flavor) Cumin-Meatloaf, fish,cheese, eggs, cabbage,fruit pie (caraway flavor) [employer], fruit, eggs, fish, poultry, cottage cheese, vegetables Dill Seed-Meat, cottage cheese, poultry, vegetables, fish, salads, bread Fennel Seed-Bread, cookies, apples, pork, eggs, fish, beets, cabbage, cheese, Licorice-like flavor Garlic-(buds or powder) Salads, meat, poultry, fish, bread, butter, vegetables, potatoes.Do not  use garlic salt Ginger-Fruit, vegetables, baked goods, meat, fish, poultry Horseradish Root-Meet, vegetables, butter Lemon Juice or Extract-Vegetables, fruit, tea, baked goods, fish salads Mace-Baked goods fruit, vegetables, fish, poultry (taste like nutmeg) Maple Extract-Syrups Marjoram-Meat, chicken, fish, vegetables, breads, green salads (taste like Sage) Mint-Tea, lamb, sherbet, vegetables, desserts, carrots, cabbage Mustard, Dry or Seed-Cheese, eggs, meats, vegetables, poultry Nutmeg-Baked goods, fruit, chicken, eggs, vegetables,  desserts Onion Powder-Meat, fish, poultry, vegetables, cheese, eggs, bread, rice salads (Do not use   Onion salt) Orange Extract-Desserts, baked goods Oregano-Pasta, eggs, cheese, onions, pork, lamb, fish, chicken, vegetables, green salads Paprika-Meat, fish, poultry, eggs, cheese, vegetables Parsley Flakes-Butter, vegetables, meat fish, poultry, eggs, bread, salads (certain forms may   Contain sodium Pepper-Meat fish, poultry, vegetables, eggs Peppermint Extract-Desserts, baked goods Poppy Seed-Eggs, bread, cheese, fruit dressings, baked goods, noodles, vegetables, cottage  [employer], poultry, meat, fish, cauliflower, turnips,eggs bread Saffron-Rice, bread, veal, chicken, fish, eggs Sage-Meat, fish, poultry, onions, eggplant, tomateos, pork, stews Savory-Eggs, salads, poultry, meat, rice, vegetables, soups, pork Tarragon-Meat, poultry, fish, eggs, butter, vegetables (licorice-like flavor)  Thyme-Meat, poultry, fish, eggs, vegetables, (clover-like flavor), sauces, soups Tumeric-Salads, butter, eggs, fish, rice, vegetables (saffron-like flavor) Vanilla Extract-Baked goods, candy Vinegar-Salads, vegetables, meat marinades Walnut Extract-baked goods, candy  2. Choose your Foods Wisely   The following is a list of foods to avoid which are high in sodium:  Meats-Avoid all smoked, canned, salt cured, dried and kosher meat and fish as well as Anchovies   [employer] meats:Bologna, Liverwurst, Pastrami Canned meat or fish  Marinated herring Caviar    Pepperoni Corned Beef   Pizza Dried chipped beef  Salami Frozen breaded fish or meat Salt pork Frankfurters or hot dogs  Sardines Gefilte fish   Sausage Ham (boiled ham, Proscuitto Smoked butt    spiced ham)   Spam      TV Dinners Vegetables Canned vegetables (Regular) Relish Canned mushrooms  Sauerkraut Olives  Tomato juice Pickles  Bakery and Dessert Products Canned  puddings  Cream pies Cheesecake   Decorated cakes Cookies  Beverages/Juices Tomato juice, regular  Gatorade   V-8 vegetable juice, regular  Breads and Cereals Biscuit mixes   Salted potato chips, corn chips, pretzels Bread stuffing mixes  Salted crackers and rolls Pancake and waffle mixes Self-rising flour  Seasonings Accent    Meat sauces Barbecue sauce  Meat tenderizer Catsup    Monosodium glutamate (MSG) Celery salt   Onion salt Chili sauce   Prepared mustard Garlic salt   Salt, seasoned salt, sea salt Gravy mixes   Soy sauce Horseradish   Steak sauce Ketchup   Tartar sauce Lite salt    Teriyaki sauce Marinade mixes   Worcestershire sauce  Others Baking powder   Cocoa and cocoa mixes Baking soda   Commercial casserole mixes Candy-caramels, chocolate  Dehydrated soups    Bars, fudge,nougats  Instant rice and pasta mixes Canned broth or soup  Maraschino cherries Cheese, aged and processed cheese and cheese spreads  Learning Assessment Quiz  Indicated T (for True) or F (for False) for each of the following statements:  1. _____ Fresh fruits and vegetables and unprocessed grains are generally low in sodium 2. _____ Water may contain a considerable amount of sodium, depending on the source 3. _____ You can always tell if a food is high in sodium by tasting it 4. _____ Certain laxatives my be high in sodium and should be avoided unless prescribed   by a physician or pharmacist 5. _____ Salt substitutes may be used freely by anyone on a sodium restricted diet 6. _____ Sodium is present in table salt, food additives and as a natural component of   most foods 7. _____ Table salt is approximately 90% sodium 8. _____ Limiting sodium intake may help prevent excess fluid accumulation in the body 9. _____ On a sodium-restricted diet, seasonings such as bouillon soy sauce, and    cooking wine should be used in place of table salt 10. _____ On an ingredient list, a product which  lists monosodium glutamate as the first   ingredient is an appropriate food to include on a low sodium diet  Circle the best answer(s) to the following statements (Hint: there may be more than one correct answer)  11. On a low-sodium diet, some acceptable snack items are:    A. Olives  F. Bean dip   K. Grapefruit juice    B. Salted Pretzels G. Commercial Popcorn   L. Canned peaches    C. Carrot Sticks  H. Bouillon   M. Unsalted nuts   D. [place] fries  I. Peanut butter crackers N. Salami   E. Sweet pickles J. Tomato Juice   O. Pizza  12.  Seasonings that may be used freely on a reduced - sodium diet include   A. Lemon wedges F.Monosodium glutamate K. Celery seed    B.Soysauce   G. Pepper   L. Mustard powder   C. Sea salt  H. Cooking wine  M. Onion flakes   D. Vinegar  E. Prepared horseradish N. Salsa   E. Sage   J. Worcestershire sauce  O. Chutney

## 2019-12-27 ENCOUNTER — Ambulatory Visit (HOSPITAL_COMMUNITY)
Admission: RE | Admit: 2019-12-27 | Discharge: 2019-12-27 | Disposition: A | Discharge status: Home / Self Care | Payer: PPO | Source: Ambulatory Visit | Attending: Internal Medicine | Admitting: Internal Medicine

## 2019-12-27 ENCOUNTER — Other Ambulatory Visit (HOSPITAL_COMMUNITY): Payer: Self-pay | Admitting: Physician Assistant

## 2019-12-27 DIAGNOSIS — I739 Peripheral vascular disease, unspecified: Secondary | ICD-10-CM

## 2019-12-27 DIAGNOSIS — R600 Localized edema: Secondary | ICD-10-CM | POA: Diagnosis not present

## 2020-01-03 DIAGNOSIS — Z20828 Contact with and (suspected) exposure to other viral communicable diseases: Secondary | ICD-10-CM | POA: Diagnosis not present

## 2020-01-11 DIAGNOSIS — E1169 Type 2 diabetes mellitus with other specified complication: Secondary | ICD-10-CM | POA: Diagnosis not present

## 2020-01-11 DIAGNOSIS — Z23 Encounter for immunization: Secondary | ICD-10-CM | POA: Diagnosis not present

## 2020-01-11 DIAGNOSIS — E785 Hyperlipidemia, unspecified: Secondary | ICD-10-CM | POA: Diagnosis not present

## 2020-01-11 DIAGNOSIS — I10 Essential (primary) hypertension: Secondary | ICD-10-CM | POA: Diagnosis not present

## 2020-01-12 ENCOUNTER — Other Ambulatory Visit: Payer: Self-pay

## 2020-01-12 ENCOUNTER — Ambulatory Visit (HOSPITAL_COMMUNITY)
Admission: RE | Admit: 2020-01-12 | Discharge: 2020-01-12 | Disposition: A | Discharge status: Home / Self Care | Payer: PPO | Source: Ambulatory Visit | Attending: Cardiology | Admitting: Cardiology

## 2020-01-12 DIAGNOSIS — I2510 Atherosclerotic heart disease of native coronary artery without angina pectoris: Secondary | ICD-10-CM | POA: Insufficient documentation

## 2020-01-12 DIAGNOSIS — E785 Hyperlipidemia, unspecified: Secondary | ICD-10-CM | POA: Diagnosis not present

## 2020-01-12 DIAGNOSIS — R600 Localized edema: Secondary | ICD-10-CM | POA: Insufficient documentation

## 2020-01-12 DIAGNOSIS — I739 Peripheral vascular disease, unspecified: Secondary | ICD-10-CM | POA: Insufficient documentation

## 2020-01-17 ENCOUNTER — Other Ambulatory Visit: Payer: Self-pay | Admitting: Sports Medicine

## 2020-01-17 DIAGNOSIS — M48061 Spinal stenosis, lumbar region without neurogenic claudication: Secondary | ICD-10-CM | POA: Diagnosis not present

## 2020-01-17 DIAGNOSIS — M1711 Unilateral primary osteoarthritis, right knee: Secondary | ICD-10-CM | POA: Diagnosis not present

## 2020-01-17 DIAGNOSIS — M545 Low back pain, unspecified: Secondary | ICD-10-CM

## 2020-01-24 ENCOUNTER — Telehealth: Payer: Self-pay | Admitting: Cardiology

## 2020-01-24 NOTE — Telephone Encounter (Signed)
New Message:    Pt would like for the nurse to call him please. He is going to [place] and have some questions about his blood pressure.

## 2020-01-24 NOTE — Telephone Encounter (Signed)
Pt is calling to get an appt with Dr. Delton See or an APP this week or early next week, for complaints of elevated BP and heart rate in the 90s. Pt states his BP has been running high for the last 2 weeks now, despite increase in his lisinopril 20 mg po daily, his PCP advised on. Pt states his HR is running higher than usual, in the 90s.  Pt states he has no symptoms, but is concerned about the elevation, despite increase in his lisinopril.  Pt states he is also going to [place] next Saturday 10/2, and is feeling very uncomfortable traveling that far, with his BP/HR being elevated.  Pt would like to be seen by our office for this, and to further manage his HTN.  Pt states he has 2 weeks worth of logged BP/HR readings, and he will bring this into his visit, for the Provider to review.  Scheduled the pt to come in and see Chelsea Aus PA-C for this Friday 9/24 at 0945.  He is aware to arrive 15 mins prior to this appt.  Advised him to bring his logged recordings with him to this appt.  Advised him to continue his current regimen, and continue to log his readings.  Informed the pt that I will route this message to Dr. Delton See as an Lorain Childes, to make her aware of this plan. Pt verbalized understanding and agrees with this plan. Pt felt reassured with having this appt made, especially prior to traveling a long distance.

## 2020-01-24 NOTE — Telephone Encounter (Signed)
Will share BP/HR readings provided by the pt, to [facility] and Dr. Delton See. Pt will see Vin in clinic for BP issues, on this Friday 9/24 at 0945.

## 2020-01-25 NOTE — Progress Notes (Signed)
Cardiology Office Note    Date:  01/26/2020   ID:  [name], [name] [birth year], MRN [id number]  PCP:  [name], MD  Cardiologist:  Dr. Delton See  Chief Complaint: Elevated BP  History of Present Illness:   Alex Jackson is a 74 y.o. male with history of CAD, hypertension, hyperlipidemia, chronic diastolic heart failure and diabetes mellitus seen for elevated blood pressure.  History of non-STEMI in 2016 and underwent stenting to LAD. EF was 34 to 50% at that time which was normalized. Patient had another non-STEMI May 2019 treated with stenting of the distal RCA into the PL OM and balloon angioplasty of the PDA through the stent struts.  LVEF 55 to 60% with grade 1 DD on 2D echo at that time.  Recent seen for claudication symptoms in setting of spinal stenosis. Follow up arterial doppler was normal. No DVT on doppler.   Added to my schedule for elevated BP. He is going to [place] 02/03/20.  Blood pressure has improved since increasing lisinopril to 20 mg daily.  He has no cardiac symptoms.  Denies chest pain, shortness of breath, orthopnea, PND, syncope or melena.  He has right lower extremity edema minimally secondary to arthritic issue.  He is taking BC powder every day.  Seems underlying anxiety/stress.   Past Medical History:  Diagnosis Date  . CAD in native artery 03/01/2015   1. cath - DES to LAD residual non obstructive RCA disease in 2016 b. Cath s/p Successful PCI with stenting of the distal RCA into the PLOM with a DES. Balloon angioplasty of the PDA through the stent struts.   . Edema of both lower extremities   . Hyperlipidemia   . Hypertension   . Leg weakness   . Overweight   . Spinal stenosis   . Ulcer of great toe [facility])     Past Surgical History:  Procedure Laterality Date  . CARDIAC CATHETERIZATION N/A 03/01/2015   Procedure: Left Heart Cath and Coronary Angiography;  Surgeon: Kathleene Hazel, MD;  Location: [facility] INVASIVE CV LAB;  Service:  Cardiovascular;  Laterality: N/A;  . CORONARY STENT INTERVENTION N/A 09/28/2017   Procedure: CORONARY STENT INTERVENTION;  Surgeon: Swaziland, Peter M, MD;  Location: [facility] INVASIVE CV LAB;  Service: Cardiovascular;  Laterality: N/A;  . LEFT HEART CATH AND CORONARY ANGIOGRAPHY N/A 09/28/2017   Procedure: LEFT HEART CATH AND CORONARY ANGIOGRAPHY;  Surgeon: Swaziland, Peter M, MD;  Location: [facility] INVASIVE CV LAB;  Service: Cardiovascular;  Laterality: N/A;  . NO PAST SURGERIES      Current Medications: Prior to Admission medications   Medication Sig Start Date End Date Taking? Authorizing Provider  acetaminophen (TYLENOL) 325 MG tablet Take 2 tablets (650 mg total) by mouth every 4 (four) hours as needed for headache or mild pain. 09/29/17   Leone Brand, NP  aspirin EC 81 MG tablet Take 81 mg by mouth daily.    [provider]  bisoprolol (ZEBETA) 10 MG tablet TAKE 1 TABLET BY MOUTH EVERY DAY 05/03/19   Lars Masson, MD  chlorthalidone (HYGROTON) 25 MG tablet Take 0.5 tablets by mouth daily. 08/04/18   [provider]  DEXILANT 60 MG capsule Take 1 capsule by mouth daily. 12/07/19   [provider]  fluticasone (FLONASE) 50 MCG/ACT nasal spray USE 1 SPRAY IN EACH NOSTRIL EVERY DAY 01/11/15   [provider]  furosemide (LASIX) 20 MG tablet TAKE 1 TABLET (20 MG TOTAL) BY MOUTH DAILY AS  NEEDED. SWELLING 11/01/18   Hoyt Leanos, Sharrell KuBhavinkumar, PA  hydroxypropyl methylcellulose / hypromellose (ISOPTO TEARS / GONIOVISC) 2.5 % ophthalmic solution Place 1 drop into both eyes as needed for dry eyes.    [provider]  lidocaine (LIDODERM) 5 % Place 1 patch onto the skin as needed. 06/20/18   [provider]  lisinopril (ZESTRIL) 20 MG tablet TAKE 1 TABLET BY MOUTH EVERY DAY 09/19/19   Leone BrandIngold, Laura R, NP  loteprednol (LOTEMAX) 0.5 % ophthalmic suspension  08/15/19   [provider]  metFORMIN (GLUCOPHAGE) 500 MG tablet Take 1 tablet (500 mg total) by mouth 2  (two) times daily. 10/01/17   Leone BrandIngold, Laura R, NP  montelukast (SINGULAIR) 10 MG tablet Take 10 mg by mouth daily. 02/04/17   [provider]  Multiple Vitamin (MULTIVITAMIN WITH MINERALS) TABS tablet Take 1 tablet by mouth daily.    [provider]  nitroGLYCERIN (NITROSTAT) 0.4 MG SL tablet Place 1 tablet (0.4 mg total) under the tongue every 5 (five) minutes x 3 doses as needed for chest pain. 09/29/17   Leone BrandIngold, Laura R, NP  omega-3 acid ethyl esters (LOVAZA) 1 g capsule TAKE 1 CAPSULE (1 G TOTAL) BY MOUTH 2 (TWO) TIMES DAILY. 07/31/19   Leone BrandIngold, Laura R, NP  rosuvastatin (CRESTOR) 10 MG tablet Take 1 tablet (10 mg total) by mouth daily. 06/28/19   Lars MassonNelson, Katarina H, MD    Allergies:   Prednisone, Atorvastatin, and Pravastatin   Social History   Socioeconomic History  . Marital status: Divorced    Spouse name: Not on file  . Number of children: Not on file  . Years of education: Not on file  . Highest education level: Not on file  Occupational History  . Not on file  Tobacco Use  . Smoking status: Former Smoker    Packs/day: 1.00    Quit date: 07/03/1983    Years since quitting: 36.5  . Smokeless tobacco: Never Used  . Tobacco comment: quit in Mar 1985  Vaping Use  . Vaping Use: Never used  Substance and Sexual Activity  . Alcohol use: Yes    Alcohol/week: 0.0 standard drinks    Comment: "one or two drinks a night"  . Drug use: No  . Sexual activity: Not on file  Other Topics Concern  . Not on file  Social History Narrative  . Not on file   Social Determinants of Health   Financial Resource Strain:   . Difficulty of Paying Living Expenses: Not on file  Food Insecurity:   . Worried About Programme researcher, broadcasting/film/videounning Out of Food in the Last Year: Not on file  . Ran Out of Food in the Last Year: Not on file  Transportation Needs:   . Lack of Transportation (Medical): Not on file  . Lack of Transportation (Non-Medical): Not on file  Physical Activity:   . Days of Exercise per  Week: Not on file  . Minutes of Exercise per Session: Not on file  Stress:   . Feeling of Stress : Not on file  Social Connections:   . Frequency of Communication with Friends and Family: Not on file  . Frequency of Social Gatherings with Friends and Family: Not on file  . Attends Religious Services: Not on file  . Active Member of Clubs or Organizations: Not on file  . Attends BankerClub or Organization Meetings: Not on file  . Marital Status: Not on file     Family History:  The patient's family history includes  Arrhythmia in his sister; Liver cancer in his father; Thyroid cancer in his mother.   ROS:   Please see the history of present illness.    ROS All other systems reviewed and are negative.   PHYSICAL EXAM:   VS:  BP 140/90   Pulse 75   Ht 5' 11.5" (1.816 m)   Wt 281 lb (127.5 kg)   SpO2 96%   BMI 38.65 kg/m    GEN: Well nourished, well developed, in no acute distress  HEENT: normal  Neck: no JVD, carotid bruits, or masses Cardiac: RRR; no murmurs, rubs, or gallops R ankle edema  Respiratory:  clear to auscultation bilaterally, normal work of breathing GI: soft, nontender, nondistended, + BS MS: no deformity or atrophy  Skin: warm and dry, no rash Neuro:  Alert and Oriented x 3, Strength and sensation are intact Psych: euthymic mood, full affect  Wt Readings from Last 3 Encounters:  01/26/20 281 lb (127.5 kg)  12/26/19 279 lb 12.8 oz (126.9 kg)  06/26/19 273 lb 12.8 oz (124.2 kg)      Studies/Labs Reviewed:   EKG:  EKG is not  ordered today.    Recent Labs: 06/26/2019: ALT 31; BUN 13; Creatinine, Ser 0.92; Hemoglobin 13.8; Platelets 216; Potassium 4.2; Sodium 141; TSH 1.760   Lipid Panel    Component Value Date/Time   CHOL 141 06/26/2019 0845   TRIG 242 (H) 06/26/2019 0845   HDL 32 (L) 06/26/2019 0845   CHOLHDL 4.4 06/26/2019 0845   CHOLHDL 5.4 09/27/2017 0222   VLDL UNABLE TO CALCULATE IF TRIGLYCERIDE OVER 400 mg/dL 16/02/9603 5409   LDLCALC 69  06/26/2019 0845    Additional studies/ records that were reviewed today include:   CORONARY STENT INTERVENTION 09/2017  LEFT HEART CATH AND CORONARY ANGIOGRAPHY  Conclusion    Previously placed Prox LAD drug eluting stent is widely patent.  Mid RCA to Dist RCA lesion is 30% stenosed.  RPDA lesion is 20% stenosed.  Dist RCA lesion is 90% stenosed with 99% stenosed side branch in Post Atrio.  A drug-eluting stent was successfully placed using a STENT SYNERGY DES 3.5X16.  Post intervention, there is a 0% residual stenosis.  Post intervention, the side branch was reduced to 0% residual stenosis.  Ost RPDA lesion is 60% stenosed.  Balloon angioplasty was performed.  Post intervention, there is a 0% residual stenosis.  LV end diastolic pressure is normal.   1. Single vessel obstructive CAD  2. Patent stent in the LAD 3. Normal LVEDP 4. Successful PCI with stenting of the distal RCA into the PLOM with a DES. Balloon angioplasty of the PDA through the stent struts.   Plan: DAPT for one year. Risk factor modification. Anticipate DC in am.     ASSESSMENT & PLAN:   1.  CAD No angina.  Continue aspirin, statin and beta-blocker.  2.  Hypertension -Recently elevated blood pressure which has improved since increasing lisinopril to 20 mg daily.  Suspect underlying anxiety/stress could be culprit.  He has no cardiac symptoms.  Check renal function today.  Otherwise continue current medication.  3.  Chronic diastolic heart failure -Volume status stable.  4. DM - Per PCP   Medication Adjustments/Labs and Tests Ordered: Current medicines are reviewed at length with the patient today.  Concerns regarding medicines are outlined above.  Medication changes, Labs and Tests ordered today are listed in the Patient Instructions below. Patient Instructions  Medication Instructions:  Your physician recommends that you continue on  your current medications as directed. Please refer to  the Current Medication list given to you today.  *If you need a refill on your cardiac medications before your next appointment, please call your pharmacy*   Lab Work: TODAY:  BMET  If you have labs (blood work) drawn today and your tests are completely normal, you will receive your results only by: Marland Kitchen MyChart Message (if you have MyChart) OR . A paper copy in the mail If you have any lab test that is abnormal or we need to change your treatment, we will call you to review the results.   Testing/Procedures: None ordered   Follow-Up: At [facility], you and your health needs are our priority.  As part of our continuing mission to provide you with exceptional heart care, we have created designated Provider Care Teams.  These Care Teams include your primary Cardiologist (physician) and Advanced Practice Providers (APPs -  Physician Assistants and Nurse Practitioners) who all work together to provide you with the care you need, when you need it.  We recommend signing up for the patient portal called "MyChart".  Sign up information is provided on this After Visit Summary.  MyChart is used to connect with patients for Virtual Visits (Telemedicine).  Patients are able to view lab/test results, encounter notes, upcoming appointments, etc.  Non-urgent messages can be sent to your provider as well.   To learn more about what you can do with MyChart, go to [email or web address].    Your next appointment:   5 month(s)   In February as scheduled   The format for your next appointment:   In Person  Provider:   Tobias Alexander, MD   Other Instructions      Signed, Manson Passey, PA  01/26/2020 10:40 AM    [facility] [address] [place], [place], [place]  [address] Phone: [phone]; Fax: [phone]

## 2020-01-26 ENCOUNTER — Encounter: Payer: Self-pay | Admitting: Physician Assistant

## 2020-01-26 ENCOUNTER — Other Ambulatory Visit: Payer: Self-pay

## 2020-01-26 ENCOUNTER — Ambulatory Visit: Payer: PPO | Admitting: Physician Assistant

## 2020-01-26 VITALS — BP 140/90 | HR 75 | Ht 71.5 in | Wt 281.0 lb

## 2020-01-26 DIAGNOSIS — R600 Localized edema: Secondary | ICD-10-CM

## 2020-01-26 DIAGNOSIS — I5032 Chronic diastolic (congestive) heart failure: Secondary | ICD-10-CM | POA: Diagnosis not present

## 2020-01-26 DIAGNOSIS — I10 Essential (primary) hypertension: Secondary | ICD-10-CM

## 2020-01-26 DIAGNOSIS — I2510 Atherosclerotic heart disease of native coronary artery without angina pectoris: Secondary | ICD-10-CM

## 2020-01-26 DIAGNOSIS — Z79899 Other long term (current) drug therapy: Secondary | ICD-10-CM | POA: Diagnosis not present

## 2020-01-26 LAB — BASIC METABOLIC PANEL
BUN/Creatinine Ratio: 19 (ref 10–24)
BUN: 17 mg/dL (ref 8–27)
CO2: 23 mmol/L (ref 20–29)
Calcium: 9.2 mg/dL (ref 8.6–10.2)
Chloride: 99 mmol/L (ref 96–106)
Creatinine, Ser: 0.89 mg/dL (ref 0.76–1.27)
GFR calc Af Amer: 99 mL/min/{1.73_m2} (ref >59)
GFR calc non Af Amer: 85 mL/min/{1.73_m2} (ref >59)
Glucose: 168 mg/dL — ABNORMAL HIGH (ref 65–99)
Potassium: 4.2 mmol/L (ref 3.5–5.2)
Sodium: 137 mmol/L (ref 134–144)

## 2020-01-26 LAB — PSA: Prostate Specific Ag, Serum: 5.9 ng/mL — ABNORMAL HIGH (ref 0.0–4.0)

## 2020-01-26 NOTE — Patient Instructions (Addendum)
Medication Instructions:  Your physician recommends that you continue on your current medications as directed. Please refer to the Current Medication list given to you today.  *If you need a refill on your cardiac medications before your next appointment, please call your pharmacy*   Lab Work: TODAY:  BMET  If you have labs (blood work) drawn today and your tests are completely normal, you will receive your results only by: Marland Kitchen MyChart Message (if you have MyChart) OR . A paper copy in the mail If you have any lab test that is abnormal or we need to change your treatment, we will call you to review the results.   Testing/Procedures: None ordered   Follow-Up: At [facility], you and your health needs are our priority.  As part of our continuing mission to provide you with exceptional heart care, we have created designated Provider Care Teams.  These Care Teams include your primary Cardiologist (physician) and Advanced Practice Providers (APPs -  Physician Assistants and Nurse Practitioners) who all work together to provide you with the care you need, when you need it.  We recommend signing up for the patient portal called "MyChart".  Sign up information is provided on this After Visit Summary.  MyChart is used to connect with patients for Virtual Visits (Telemedicine).  Patients are able to view lab/test results, encounter notes, upcoming appointments, etc.  Non-urgent messages can be sent to your provider as well.   To learn more about what you can do with MyChart, go to [email or web address].    Your next appointment:   5 month(s)   In February as scheduled   The format for your next appointment:   In Person  Provider:   Tobias Alexander, MD   Other Instructions

## 2020-02-12 ENCOUNTER — Ambulatory Visit
Admission: RE | Admit: 2020-02-12 | Discharge: 2020-02-12 | Disposition: A | Discharge status: Home / Self Care | Payer: PPO | Source: Ambulatory Visit | Attending: Sports Medicine | Admitting: Sports Medicine

## 2020-02-12 DIAGNOSIS — M545 Low back pain, unspecified: Secondary | ICD-10-CM

## 2020-02-12 DIAGNOSIS — M48061 Spinal stenosis, lumbar region without neurogenic claudication: Secondary | ICD-10-CM | POA: Diagnosis not present

## 2020-02-12 DIAGNOSIS — M5126 Other intervertebral disc displacement, lumbar region: Secondary | ICD-10-CM | POA: Diagnosis not present

## 2020-03-07 ENCOUNTER — Other Ambulatory Visit: Payer: Self-pay | Admitting: Cardiology

## 2020-03-07 DIAGNOSIS — I10 Essential (primary) hypertension: Secondary | ICD-10-CM

## 2020-03-07 DIAGNOSIS — I2510 Atherosclerotic heart disease of native coronary artery without angina pectoris: Secondary | ICD-10-CM

## 2020-03-07 DIAGNOSIS — Z955 Presence of coronary angioplasty implant and graft: Secondary | ICD-10-CM

## 2020-03-07 DIAGNOSIS — I255 Ischemic cardiomyopathy: Secondary | ICD-10-CM

## 2020-03-07 DIAGNOSIS — I25110 Atherosclerotic heart disease of native coronary artery with unstable angina pectoris: Secondary | ICD-10-CM

## 2020-03-07 DIAGNOSIS — E785 Hyperlipidemia, unspecified: Secondary | ICD-10-CM

## 2020-04-09 DIAGNOSIS — R972 Elevated prostate specific antigen [PSA]: Secondary | ICD-10-CM | POA: Diagnosis not present

## 2020-05-14 DIAGNOSIS — R351 Nocturia: Secondary | ICD-10-CM | POA: Diagnosis not present

## 2020-05-14 DIAGNOSIS — R972 Elevated prostate specific antigen [PSA]: Secondary | ICD-10-CM | POA: Diagnosis not present

## 2020-05-14 DIAGNOSIS — N5201 Erectile dysfunction due to arterial insufficiency: Secondary | ICD-10-CM | POA: Diagnosis not present

## 2020-06-18 DIAGNOSIS — R972 Elevated prostate specific antigen [PSA]: Secondary | ICD-10-CM | POA: Diagnosis not present

## 2020-06-18 DIAGNOSIS — E785 Hyperlipidemia, unspecified: Secondary | ICD-10-CM | POA: Diagnosis not present

## 2020-06-18 DIAGNOSIS — E1169 Type 2 diabetes mellitus with other specified complication: Secondary | ICD-10-CM | POA: Diagnosis not present

## 2020-06-18 DIAGNOSIS — I2510 Atherosclerotic heart disease of native coronary artery without angina pectoris: Secondary | ICD-10-CM | POA: Diagnosis not present

## 2020-06-18 DIAGNOSIS — I10 Essential (primary) hypertension: Secondary | ICD-10-CM | POA: Diagnosis not present

## 2020-06-18 DIAGNOSIS — Z0000 Encounter for general adult medical examination without abnormal findings: Secondary | ICD-10-CM | POA: Diagnosis not present

## 2020-06-24 ENCOUNTER — Other Ambulatory Visit: Payer: Self-pay

## 2020-06-24 ENCOUNTER — Encounter: Payer: Self-pay | Admitting: Cardiology

## 2020-06-24 ENCOUNTER — Ambulatory Visit: Payer: PPO | Admitting: Cardiology

## 2020-06-24 DIAGNOSIS — I25110 Atherosclerotic heart disease of native coronary artery with unstable angina pectoris: Secondary | ICD-10-CM

## 2020-06-24 DIAGNOSIS — I255 Ischemic cardiomyopathy: Secondary | ICD-10-CM | POA: Diagnosis not present

## 2020-06-24 DIAGNOSIS — I10 Essential (primary) hypertension: Secondary | ICD-10-CM

## 2020-06-24 DIAGNOSIS — E785 Hyperlipidemia, unspecified: Secondary | ICD-10-CM

## 2020-06-24 DIAGNOSIS — I2510 Atherosclerotic heart disease of native coronary artery without angina pectoris: Secondary | ICD-10-CM | POA: Diagnosis not present

## 2020-06-24 DIAGNOSIS — Z955 Presence of coronary angioplasty implant and graft: Secondary | ICD-10-CM | POA: Diagnosis not present

## 2020-06-24 MED ORDER — LISINOPRIL 40 MG PO TABS: 40.0000 mg | ORAL_TABLET | Freq: Every day | ORAL | 3 refills | Status: DC

## 2020-06-24 MED ORDER — PREGABALIN 75 MG PO CAPS: 75.0000 mg | ORAL_CAPSULE | Freq: Two times a day (BID) | ORAL | 11 refills | Status: DC

## 2020-06-24 MED ORDER — CHLORTHALIDONE 25 MG PO TABS: 25.0000 mg | ORAL_TABLET | Freq: Every day | ORAL | 3 refills | Status: DC

## 2020-06-24 MED ORDER — FUROSEMIDE 40 MG PO TABS: 40.0000 mg | ORAL_TABLET | Freq: Every day | ORAL | 3 refills | Status: DC

## 2020-06-24 NOTE — Patient Instructions (Signed)
Medication Instructions:  1) INCREASE CHLORTHALIDONE to 25 mg daily 2) INCREASE LASIX (furosemide) to 40 mg daily 3) INCREASE LISINOPRIL to 40 mg daily  4) START LYRICA 75 mg twice daily *If you need a refill on your cardiac medications before your next appointment, please call your pharmacy*   Follow-Up: At [facility], you and your health needs are our priority.  As part of our continuing mission to provide you with exceptional heart care, we have created designated Provider Care Teams.  These Care Teams include your primary Cardiologist (physician) and Advanced Practice Providers (APPs -  Physician Assistants and Nurse Practitioners) who all work together to provide you with the care you need, when you need it. Your next appointment:   6 month(s) The format for your next appointment:   In Person Provider:   You may see Dr. Laurance Flatten or one of the following Advanced Practice Providers on your designated Care Team:    Tereso Newcomer, PA-C  [place], [place]

## 2020-06-24 NOTE — Progress Notes (Signed)
Cardiology Office Note    Date:  06/24/2020   ID:  Tukker, Byrns 1945/11/24, MRN [id number]  PCP:  [name], MD  Cardiologist:  Dr. Delton See  Chief Complaint: Elevated BP  History of Present Illness:   Alex Jackson is a 75 y.o. male with history of CAD, hypertension, hyperlipidemia, chronic diastolic heart failure and diabetes mellitus seen for elevated blood pressure.  History of non-STEMI in 2016 and underwent stenting to LAD. EF was 34 to 50% at that time which was normalized. Patient had another non-STEMI May 2019 treated with stenting of the distal RCA into the PL OM and balloon angioplasty of the PDA through the stent struts.  LVEF 55 to 60% with grade 1 DD on 2D echo at that time.  Recent seen for claudication symptoms in setting of spinal stenosis. Follow up arterial doppler was normal. No DVT on doppler.   The patient is coming after 6 months, he has been doing great, he continues to work as a Naval architect, he denies any chest pain or shortness of breath, he has trivial lower extremity edema, no orthopnea proximal nocturnal dyspnea.  His complaints are chronic sinusitis for which he takes different antihistaminics and Mucinex.  He has noticed that his blood pressure has been significantly elevated, sometimes can be as low as 120 but other times as high as 180 mmHg.  The patient complains of peripheral neuropathy pain in bilateral lower extremities.  He has been compliant with his meds.  Past Medical History:  Diagnosis Date  . CAD in native artery 03/01/2015   1. cath - DES to LAD residual non obstructive RCA disease in 2016 b. Cath s/p Successful PCI with stenting of the distal RCA into the PLOM with a DES. Balloon angioplasty of the PDA through the stent struts.   . Edema of both lower extremities   . Hyperlipidemia   . Hypertension   . Leg weakness   . Overweight   . Spinal stenosis   . Ulcer of great toe [facility])     Past Surgical History:  Procedure  Laterality Date  . CARDIAC CATHETERIZATION N/A 03/01/2015   Procedure: Left Heart Cath and Coronary Angiography;  Surgeon: Kathleene Hazel, MD;  Location: [facility] INVASIVE CV LAB;  Service: Cardiovascular;  Laterality: N/A;  . CORONARY STENT INTERVENTION N/A 09/28/2017   Procedure: CORONARY STENT INTERVENTION;  Surgeon: Swaziland, Peter M, MD;  Location: [facility] INVASIVE CV LAB;  Service: Cardiovascular;  Laterality: N/A;  . LEFT HEART CATH AND CORONARY ANGIOGRAPHY N/A 09/28/2017   Procedure: LEFT HEART CATH AND CORONARY ANGIOGRAPHY;  Surgeon: Swaziland, Peter M, MD;  Location: [facility] INVASIVE CV LAB;  Service: Cardiovascular;  Laterality: N/A;  . NO PAST SURGERIES      Current Medications: Prior to Admission medications   Medication Sig Start Date End Date Taking? Authorizing Provider  acetaminophen (TYLENOL) 325 MG tablet Take 2 tablets (650 mg total) by mouth every 4 (four) hours as needed for headache or mild pain. 09/29/17   Leone Brand, NP  aspirin EC 81 MG tablet Take 81 mg by mouth daily.    [provider]  bisoprolol (ZEBETA) 10 MG tablet TAKE 1 TABLET BY MOUTH EVERY DAY 05/03/19   Lars Masson, MD  chlorthalidone (HYGROTON) 25 MG tablet Take 0.5 tablets by mouth daily. 08/04/18   [provider]  DEXILANT 60 MG capsule Take 1 capsule by mouth daily. 12/07/19   [provider]  fluticasone (FLONASE) 50  MCG/ACT nasal spray USE 1 SPRAY IN EACH NOSTRIL EVERY DAY 01/11/15   [provider]  furosemide (LASIX) 20 MG tablet TAKE 1 TABLET (20 MG TOTAL) BY MOUTH DAILY AS NEEDED. SWELLING 11/01/18   Bhagat, Sharrell Ku, PA  hydroxypropyl methylcellulose / hypromellose (ISOPTO TEARS / GONIOVISC) 2.5 % ophthalmic solution Place 1 drop into both eyes as needed for dry eyes.    [provider]  lidocaine (LIDODERM) 5 % Place 1 patch onto the skin as needed. 06/20/18   [provider]  lisinopril (ZESTRIL) 20 MG tablet TAKE 1 TABLET BY MOUTH EVERY DAY 09/19/19    Leone Brand, NP  loteprednol (LOTEMAX) 0.5 % ophthalmic suspension  08/15/19   [provider]  metFORMIN (GLUCOPHAGE) 500 MG tablet Take 1 tablet (500 mg total) by mouth 2 (two) times daily. 10/01/17   Leone Brand, NP  montelukast (SINGULAIR) 10 MG tablet Take 10 mg by mouth daily. 02/04/17   [provider]  Multiple Vitamin (MULTIVITAMIN WITH MINERALS) TABS tablet Take 1 tablet by mouth daily.    [provider]  nitroGLYCERIN (NITROSTAT) 0.4 MG SL tablet Place 1 tablet (0.4 mg total) under the tongue every 5 (five) minutes x 3 doses as needed for chest pain. 09/29/17   Leone Brand, NP  omega-3 acid ethyl esters (LOVAZA) 1 g capsule TAKE 1 CAPSULE (1 G TOTAL) BY MOUTH 2 (TWO) TIMES DAILY. 07/31/19   Leone Brand, NP  rosuvastatin (CRESTOR) 10 MG tablet Take 1 tablet (10 mg total) by mouth daily. 06/28/19   Lars Masson, MD    Allergies:   Prednisone, Atorvastatin, and Pravastatin   Social History   Socioeconomic History  . Marital status: Divorced    Spouse name: Not on file  . Number of children: Not on file  . Years of education: Not on file  . Highest education level: Not on file  Occupational History  . Not on file  Tobacco Use  . Smoking status: Former Smoker    Packs/day: 1.00    Quit date: 07/03/1983    Years since quitting: 37.0  . Smokeless tobacco: Never Used  . Tobacco comment: quit in Mar 1985  Vaping Use  . Vaping Use: Never used  Substance and Sexual Activity  . Alcohol use: Yes    Alcohol/week: 0.0 standard drinks    Comment: "one or two drinks a night"  . Drug use: No  . Sexual activity: Not on file  Other Topics Concern  . Not on file  Social History Narrative  . Not on file   Social Determinants of Health   Financial Resource Strain: Not on file  Food Insecurity: Not on file  Transportation Needs: Not on file  Physical Activity: Not on file  Stress: Not on file  Social Connections: Not on file    Family  History:  The patient's family history includes Arrhythmia in his sister; Liver cancer in his father; Thyroid cancer in his mother.   ROS:   Please see the history of present illness.    ROS All other systems reviewed and are negative.  PHYSICAL EXAM:   VS:  BP (!) 180/95   Pulse 74   Ht 5' 11.5" (1.816 m)   Wt 274 lb (124.3 kg)   SpO2 95%   BMI 37.68 kg/m    GEN: Well nourished, well developed, in no acute distress  HEENT: normal  Neck: no JVD, carotid bruits, or masses Cardiac: RRR; no murmurs, rubs,  or gallops R ankle edema  Respiratory:  clear to auscultation bilaterally, normal work of breathing GI: soft, nontender, nondistended, + BS MS: no deformity or atrophy  Skin: warm and dry, no rash Neuro:  Alert and Oriented x 3, Strength and sensation are intact Psych: euthymic mood, full affect  Wt Readings from Last 3 Encounters:  06/24/20 274 lb (124.3 kg)  01/26/20 281 lb (127.5 kg)  12/26/19 279 lb 12.8 oz (126.9 kg)      Studies/Labs Reviewed:   EKG:  EKG is not  ordered today.    Recent Labs: 06/26/2019: ALT 31; Hemoglobin 13.8; Platelets 216; TSH 1.760 01/26/2020: BUN 17; Creatinine, Ser 0.89; Potassium 4.2; Sodium 137   Lipid Panel    Component Value Date/Time   CHOL 141 06/26/2019 0845   TRIG 242 (H) 06/26/2019 0845   HDL 32 (L) 06/26/2019 0845   CHOLHDL 4.4 06/26/2019 0845   CHOLHDL 5.4 09/27/2017 0222   VLDL UNABLE TO CALCULATE IF TRIGLYCERIDE OVER 400 mg/dL 53/61/4431 5400   LDLCALC 69 06/26/2019 0845   Additional studies/ records that were reviewed today include:   CORONARY STENT INTERVENTION 09/2017  LEFT HEART CATH AND CORONARY ANGIOGRAPHY  Conclusion    Previously placed Prox LAD drug eluting stent is widely patent.  Mid RCA to Dist RCA lesion is 30% stenosed.  RPDA lesion is 20% stenosed.  Dist RCA lesion is 90% stenosed with 99% stenosed side branch in Post Atrio.  A drug-eluting stent was successfully placed using a STENT SYNERGY DES  3.5X16.  Post intervention, there is a 0% residual stenosis.  Post intervention, the side branch was reduced to 0% residual stenosis.  Ost RPDA lesion is 60% stenosed.  Balloon angioplasty was performed.  Post intervention, there is a 0% residual stenosis.  LV end diastolic pressure is normal.   1. Single vessel obstructive CAD  2. Patent stent in the LAD 3. Normal LVEDP 4. Successful PCI with stenting of the distal RCA into the PLOM with a DES. Balloon angioplasty of the PDA through the stent struts.   Plan: DAPT for one year. Risk factor modification. Anticipate DC in am.     ASSESSMENT & PLAN:   1.  CAD No angina.  Continue aspirin, bisoprolol, lisinopril and rosuvastatin.  He is EKG today is unchanged and normal.  2.  Hypertension -Elevated blood pressure, today 170/95 mmHg, will increase lisinopril to 40 mg daily, chlorthalidone to 25 mg daily, and Lasix to 40 mg daily.  He will send [place] blood pressure diary within the next month and we will uptitrate as needed potentially add additional agent.  3.  Chronic diastolic heart failure -He has mild swelling in his legs, again we talked about necessity of regular walking and low-salt diet that would also help with his hypertension, I will increase chlorthalidone and Lasix today as described above.  4. DM - Per PCP, most recent hemoglobin A1c 6.9%.  5.  Hyperlipidemia He is tolerating rosuvastatin well.  His most recent lipids are LDL 65 HDL 36 and triglycerides 182.   Medication Adjustments/Labs and Tests Ordered: Current medicines are reviewed at length with the patient today.  Concerns regarding medicines are outlined above.  Medication changes, Labs and Tests ordered today are listed in the Patient Instructions below. There are no Patient Instructions on file for this visit.   Signed, Tobias Alexander, MD  06/24/2020 8:54 AM    [facility] [address] [place], [place], [place]  [address] Phone: [phone])  [phone]; Fax: [phone]

## 2020-07-16 ENCOUNTER — Other Ambulatory Visit: Payer: Self-pay | Admitting: Sports Medicine

## 2020-07-16 DIAGNOSIS — M48061 Spinal stenosis, lumbar region without neurogenic claudication: Secondary | ICD-10-CM

## 2020-07-19 ENCOUNTER — Other Ambulatory Visit: Payer: Self-pay | Admitting: Cardiology

## 2020-07-24 ENCOUNTER — Other Ambulatory Visit: Payer: Self-pay

## 2020-07-24 ENCOUNTER — Ambulatory Visit
Admission: RE | Admit: 2020-07-24 | Discharge: 2020-07-24 | Disposition: A | Discharge status: Home / Self Care | Payer: PPO | Source: Ambulatory Visit | Attending: Sports Medicine | Admitting: Sports Medicine

## 2020-07-24 DIAGNOSIS — M545 Low back pain, unspecified: Secondary | ICD-10-CM | POA: Diagnosis not present

## 2020-07-24 DIAGNOSIS — M48061 Spinal stenosis, lumbar region without neurogenic claudication: Secondary | ICD-10-CM

## 2020-07-24 MED ORDER — METHYLPREDNISOLONE ACETATE 40 MG/ML INJ SUSP (RADIOLOG
120.0000 mg | Freq: Once | INTRAMUSCULAR | Status: AC
  Administered 2020-07-24: 120 mg via EPIDURAL

## 2020-07-24 MED ORDER — IOPAMIDOL (ISOVUE-M 200) INJECTION 41%
1.0000 mL | Freq: Once | INTRAMUSCULAR | Status: AC
  Administered 2020-07-24: 1 mL via EPIDURAL

## 2020-07-24 NOTE — Discharge Instructions (Signed)

## 2020-08-06 ENCOUNTER — Ambulatory Visit: Payer: Self-pay | Admitting: Allergy and Immunology

## 2020-09-25 DIAGNOSIS — M48061 Spinal stenosis, lumbar region without neurogenic claudication: Secondary | ICD-10-CM | POA: Diagnosis not present

## 2020-10-01 ENCOUNTER — Encounter (HOSPITAL_BASED_OUTPATIENT_CLINIC_OR_DEPARTMENT_OTHER): Payer: Self-pay | Admitting: Emergency Medicine

## 2020-10-01 ENCOUNTER — Other Ambulatory Visit: Payer: Self-pay

## 2020-10-01 ENCOUNTER — Emergency Department (HOSPITAL_BASED_OUTPATIENT_CLINIC_OR_DEPARTMENT_OTHER)
Admission: EM | Admit: 2020-10-01 | Discharge: 2020-10-01 | Disposition: A | Discharge status: Home / Self Care | Payer: PPO | Attending: Emergency Medicine | Admitting: Emergency Medicine

## 2020-10-01 ENCOUNTER — Emergency Department (HOSPITAL_BASED_OUTPATIENT_CLINIC_OR_DEPARTMENT_OTHER): Payer: PPO | Admitting: Radiology

## 2020-10-01 DIAGNOSIS — I2510 Atherosclerotic heart disease of native coronary artery without angina pectoris: Secondary | ICD-10-CM | POA: Diagnosis not present

## 2020-10-01 DIAGNOSIS — Z20822 Contact with and (suspected) exposure to covid-19: Secondary | ICD-10-CM | POA: Diagnosis not present

## 2020-10-01 DIAGNOSIS — Z955 Presence of coronary angioplasty implant and graft: Secondary | ICD-10-CM | POA: Diagnosis not present

## 2020-10-01 DIAGNOSIS — I10 Essential (primary) hypertension: Secondary | ICD-10-CM | POA: Insufficient documentation

## 2020-10-01 DIAGNOSIS — M869 Osteomyelitis, unspecified: Secondary | ICD-10-CM

## 2020-10-01 DIAGNOSIS — M86171 Other acute osteomyelitis, right ankle and foot: Secondary | ICD-10-CM | POA: Insufficient documentation

## 2020-10-01 DIAGNOSIS — Z79899 Other long term (current) drug therapy: Secondary | ICD-10-CM | POA: Insufficient documentation

## 2020-10-01 DIAGNOSIS — Z7982 Long term (current) use of aspirin: Secondary | ICD-10-CM | POA: Diagnosis not present

## 2020-10-01 DIAGNOSIS — L97519 Non-pressure chronic ulcer of other part of right foot with unspecified severity: Secondary | ICD-10-CM | POA: Diagnosis not present

## 2020-10-01 DIAGNOSIS — E11621 Type 2 diabetes mellitus with foot ulcer: Secondary | ICD-10-CM | POA: Diagnosis not present

## 2020-10-01 DIAGNOSIS — Z7984 Long term (current) use of oral hypoglycemic drugs: Secondary | ICD-10-CM | POA: Insufficient documentation

## 2020-10-01 DIAGNOSIS — Z87891 Personal history of nicotine dependence: Secondary | ICD-10-CM | POA: Diagnosis not present

## 2020-10-01 DIAGNOSIS — L97511 Non-pressure chronic ulcer of other part of right foot limited to breakdown of skin: Secondary | ICD-10-CM | POA: Insufficient documentation

## 2020-10-01 DIAGNOSIS — E1165 Type 2 diabetes mellitus with hyperglycemia: Secondary | ICD-10-CM | POA: Insufficient documentation

## 2020-10-01 DIAGNOSIS — E785 Hyperlipidemia, unspecified: Secondary | ICD-10-CM | POA: Diagnosis not present

## 2020-10-01 DIAGNOSIS — R739 Hyperglycemia, unspecified: Secondary | ICD-10-CM

## 2020-10-01 DIAGNOSIS — M19071 Primary osteoarthritis, right ankle and foot: Secondary | ICD-10-CM | POA: Diagnosis not present

## 2020-10-01 HISTORY — DX: Type 2 diabetes mellitus without complications: E11.9

## 2020-10-01 LAB — CBC
HCT: 35.6 % — ABNORMAL LOW (ref 39.0–52.0)
Hemoglobin: 12.3 g/dL — ABNORMAL LOW (ref 13.0–17.0)
MCH: 32.3 pg (ref 26.0–34.0)
MCHC: 34.6 g/dL (ref 30.0–36.0)
MCV: 93.4 fL (ref 80.0–100.0)
Platelets: 189 10*3/uL (ref 150–400)
RBC: 3.81 MIL/uL — ABNORMAL LOW (ref 4.22–5.81)
RDW: 12.6 % (ref 11.5–15.5)
WBC: 9.8 10*3/uL (ref 4.0–10.5)
nRBC: 0 % (ref 0.0–0.2)

## 2020-10-01 LAB — BASIC METABOLIC PANEL
Anion gap: 9 (ref 5–15)
BUN: 26 mg/dL — ABNORMAL HIGH (ref 8–23)
CO2: 27 mmol/L (ref 22–32)
Calcium: 8.6 mg/dL — ABNORMAL LOW (ref 8.9–10.3)
Chloride: 100 mmol/L (ref 98–111)
Creatinine, Ser: 0.93 mg/dL (ref 0.61–1.24)
GFR, Estimated: >60 mL/min (ref >60)
Glucose, Bld: 208 mg/dL — ABNORMAL HIGH (ref 70–99)
Potassium: 3.8 mmol/L (ref 3.5–5.1)
Sodium: 136 mmol/L (ref 135–145)

## 2020-10-01 MED ORDER — CLINDAMYCIN HCL 150 MG PO CAPS: 150.0000 mg | ORAL_CAPSULE | Freq: Four times a day (QID) | ORAL | 0 refills | Status: DC

## 2020-10-01 MED ORDER — CLINDAMYCIN PHOSPHATE 600 MG/50ML IV SOLN: 600.0000 mg | Freq: Once | INTRAVENOUS | Status: DC

## 2020-10-01 MED ORDER — CLINDAMYCIN HCL 150 MG PO CAPS
300.0000 mg | ORAL_CAPSULE | Freq: Once | ORAL | Status: AC
  Administered 2020-10-01: 300 mg via ORAL
  Filled 2020-10-01: qty 2

## 2020-10-01 NOTE — ED Notes (Signed)
Patient verbalizes understanding of discharge instructions. Opportunity for questioning and answers were provided. Armband removed by staff, pt discharged from ED.  

## 2020-10-01 NOTE — ED Provider Notes (Signed)
[facility] EMERGENCY DEPT Provider Note   CSN: [id number] Arrival date & time: 10/01/20  1015     History Chief Complaint  Patient presents with  . Toe Injury    Alex Jackson is a 75 y.o. male.  HPI  75 year old man history of diabetes, coronary artery disease, hyperlipidemia, hypertension, ulcer of his great toe presents today complaining of wound to his right great toe on the plantar aspect that began approximately 1 week ago.  He has noticed worsening of the wound with desquamation.  Of skin.  He denies any significant pain, fever, chills, injury.  He thinks this occurred due to wearing his shoes without socks.  He states he has poor sensation in his toes     Past Medical History:  Diagnosis Date  . CAD in native artery 03/01/2015   1. cath - DES to LAD residual non obstructive RCA disease in 2016 b. Cath s/p Successful PCI with stenting of the distal RCA into the PLOM with a DES. Balloon angioplasty of the PDA through the stent struts.   . Diabetes mellitus without complication (HCC)   . Edema of both lower extremities   . Hyperlipidemia   . Hypertension   . Leg weakness   . Overweight   . Spinal stenosis   . Ulcer of great toe [facility])     Patient Active Problem List   Diagnosis Date Noted  . Piriformis syndrome of left side 07/23/2017  . Coronary artery disease involving native coronary artery of native heart with unstable angina pectoris (HCC) 02/11/2017  . Lumbar spondylosis 02/11/2017  . Iliotibial band syndrome of both sides 02/11/2017  . H/O hyperglycemia 02/11/2017  . Chronic lower back pain 02/11/2017  . Cardiomyopathy, ischemic 09/16/2015  . SOB (shortness of breath) 09/16/2015  . LV dysfunction 09/16/2015  . Hyperlipidemia 03/02/2015  . Essential hypertension 03/02/2015  . S/P drug eluting coronary stent placement 03/02/2015  . NSTEMI (non-ST elevated myocardial infarction) (HCC) 03/01/2015    Past Surgical History:  Procedure  Laterality Date  . CARDIAC CATHETERIZATION N/A 03/01/2015   Procedure: Left Heart Cath and Coronary Angiography;  Surgeon: Kathleene Hazel, MD;  Location: [facility] INVASIVE CV LAB;  Service: Cardiovascular;  Laterality: N/A;  . CORONARY STENT INTERVENTION N/A 09/28/2017   Procedure: CORONARY STENT INTERVENTION;  Surgeon: Swaziland, Peter M, MD;  Location: [facility] INVASIVE CV LAB;  Service: Cardiovascular;  Laterality: N/A;  . LEFT HEART CATH AND CORONARY ANGIOGRAPHY N/A 09/28/2017   Procedure: LEFT HEART CATH AND CORONARY ANGIOGRAPHY;  Surgeon: Swaziland, Peter M, MD;  Location: [facility] INVASIVE CV LAB;  Service: Cardiovascular;  Laterality: N/A;  . NO PAST SURGERIES         Family History  Problem Relation Age of Onset  . Thyroid cancer Mother   . Liver cancer Father        pancreatic  . Arrhythmia Sister     Social History   Tobacco Use  . Smoking status: Former Smoker    Packs/day: 1.00    Quit date: 07/03/1983    Years since quitting: 37.2  . Smokeless tobacco: Never Used  . Tobacco comment: quit in Mar 1985  Vaping Use  . Vaping Use: Never used  Substance Use Topics  . Alcohol use: Yes    Alcohol/week: 0.0 standard drinks    Comment: "one or two drinks a night"  . Drug use: No    Home Medications Prior to Admission medications   Medication Sig Start Date End Date Taking?  Authorizing Provider  acetaminophen (TYLENOL) 325 MG tablet Take 2 tablets (650 mg total) by mouth every 4 (four) hours as needed for headache or mild pain. 09/29/17   Leone Brand, NP  aspirin EC 81 MG tablet Take 81 mg by mouth daily.    [provider]  bisoprolol (ZEBETA) 10 MG tablet TAKE 1 TABLET BY MOUTH EVERY DAY 03/07/20   Bhagat, Bhavinkumar, PA  chlorthalidone (HYGROTON) 25 MG tablet Take 1 tablet (25 mg total) by mouth daily. 06/24/20 06/19/21  Lars Masson, MD  DEXILANT 60 MG capsule Take 1 capsule by mouth daily. 12/07/19   [provider]  fluticasone (FLONASE) 50 MCG/ACT nasal  spray USE 1 SPRAY IN EACH NOSTRIL EVERY DAY 01/11/15   [provider]  furosemide (LASIX) 40 MG tablet Take 1 tablet (40 mg total) by mouth daily. 06/24/20 06/19/21  Lars Masson, MD  hydroxypropyl methylcellulose / hypromellose (ISOPTO TEARS / GONIOVISC) 2.5 % ophthalmic solution Place 1 drop into both eyes as needed for dry eyes.    [provider]  lisinopril (ZESTRIL) 40 MG tablet Take 1 tablet (40 mg total) by mouth daily. 06/24/20 06/19/21  Lars Masson, MD  loteprednol (LOTEMAX) 0.5 % ophthalmic suspension  08/15/19   [provider]  metFORMIN (GLUCOPHAGE) 500 MG tablet Take 1 tablet (500 mg total) by mouth 2 (two) times daily. 10/01/17   Leone Brand, NP  Multiple Vitamin (MULTIVITAMIN WITH MINERALS) TABS tablet Take 1 tablet by mouth daily.    [provider]  nitroGLYCERIN (NITROSTAT) 0.4 MG SL tablet Place 1 tablet (0.4 mg total) under the tongue every 5 (five) minutes x 3 doses as needed for chest pain. 09/29/17   Leone Brand, NP  omega-3 acid ethyl esters (LOVAZA) 1 g capsule TAKE ONE CAPSULE BY MOUTH TWICE DAILY 07/19/20   Leone Brand, NP  pregabalin (LYRICA) 75 MG capsule Take 1 capsule (75 mg total) by mouth 2 (two) times daily. 06/24/20 06/19/21  Lars Masson, MD  rosuvastatin (CRESTOR) 10 MG tablet TAKE ONE TABLET BY MOUTH ONCE DAILY 07/19/20   Lars Masson, MD    Allergies    Prednisone, Atorvastatin, and Pravastatin  Review of Systems   Review of Systems  All other systems reviewed and are negative.   Physical Exam Updated Vital Signs BP 134/68 (BP Location: Right Arm)   Pulse 78   Resp 18   Ht 1.829 m (6')   Wt 121.6 kg   SpO2 94%   BMI 36.35 kg/m   Physical Exam Vitals and nursing note reviewed.  HENT:     Head: Normocephalic.     Right Ear: External ear normal.     Left Ear: External ear normal.     Nose: Nose normal.     Mouth/Throat:     Pharynx: Oropharynx is clear.  Eyes:     Pupils:  Pupils are equal, round, and reactive to light.  Cardiovascular:     Rate and Rhythm: Normal rate.     Pulses: Normal pulses.     Heart sounds: Normal heart sounds.  Pulmonary:     Effort: Pulmonary effort is normal.  Abdominal:     General: Abdomen is flat.  Musculoskeletal:     Cervical back: Normal range of motion.     Comments: Right great toe with wound on bottom and surrounding erythema breading dorsally and to the first joint  Skin:    General: Skin is warm.  Capillary Refill: Capillary refill takes less than 2 seconds.  Neurological:     General: No focal deficit present.     Mental Status: He is alert.  Psychiatric:        Mood and Affect: Mood normal.         ED Results / Procedures / Treatments   Labs (all labs ordered are listed, but only abnormal results are displayed) Labs Reviewed - No data to display  EKG None  Radiology DG Toe Great Right  Result Date: 10/01/2020 CLINICAL DATA:  Diabetes.  Left great toe. EXAM: RIGHT GREAT TOE COMPARISON:  No recent. FINDINGS: No evidence of fracture or dislocation. Diffuse degenerative change. Soft tissue wound over the left great toe may be present. A small lucencies noted in the distal aspect of the distal phalanx of the left great toe. A focal area of osteomyelitis cannot be excluded. No radiopaque foreign body. IMPRESSION: Diffuse degenerative change. Soft tissue wound over the left great toe may be present. A small lucency in the distal aspect of the distal phalanx of the left great toe may be present. A focal area of osteomyelitis cannot be excluded. Electronically Signed   By: Maisie Fus  Register   On: 10/01/2020 10:47    Procedures Procedures   Medications Ordered in ED Medications - No data to display  ED Course  I have reviewed the triage vital signs and the nursing notes.  Pertinent labs & imaging results that were available during my care of the patient were reviewed by me and considered in my medical  decision making (see chart for details).    MDM Rules/Calculators/A&P                          Patient with diabetes and toe ulcer.  There is some focal area that could be osteomyelitis seen on plain x-Hines Kloss.  White blood cell count is normal.  Patient is afebrile. Plan clindamycin and postop as well as rest and elevation Plan outpatient follow-up with orthopedics Blood sugar elevated at 208 patient reports taking his metformin.  He is advised to follow his blood sugars and follow-up with primary care  Patient advised of return precautions and need for close follow-up and voices understanding. Patient plans on going on cruise and leaving on Friday.  Care discussed with Clerance Lav persons, PA-C.  Dr. Audrie Lia office will call for recheck before then. Final Clinical Impression(s) / ED Diagnoses Final diagnoses:  Osteomyelitis of right foot, unspecified type (HCC)  Hyperglycemia    Rx / DC Orders ED Discharge Orders    None       Margarita Grizzle, MD 10/01/20 1217

## 2020-10-01 NOTE — Discharge Instructions (Signed)
Please keep wound clean and dry Take antibiotics as prescribed Keep foot elevated is much as possible Dr. Audrie Lia office should call you today for follow-up before you leave Return immediately to the emergency department if you have streaking up your leg, fever, or worsening wound Please call your primary care doctor for recheck of blood sugar

## 2020-10-01 NOTE — ED Triage Notes (Signed)
Patient reports toe pain in the right great toe. Patient reports he had a blister and it has since progressed to skin coming off the toe. Patient reports it has not affected the nail bed.

## 2020-10-02 DIAGNOSIS — M5137 Other intervertebral disc degeneration, lumbosacral region: Secondary | ICD-10-CM | POA: Diagnosis not present

## 2020-10-03 ENCOUNTER — Ambulatory Visit: Payer: PPO | Admitting: Orthopedic Surgery

## 2020-10-03 ENCOUNTER — Encounter: Payer: Self-pay | Admitting: Orthopedic Surgery

## 2020-10-03 VITALS — Ht 72.0 in | Wt 268.0 lb

## 2020-10-03 DIAGNOSIS — R972 Elevated prostate specific antigen [PSA]: Secondary | ICD-10-CM | POA: Diagnosis not present

## 2020-10-03 DIAGNOSIS — E1169 Type 2 diabetes mellitus with other specified complication: Secondary | ICD-10-CM | POA: Diagnosis not present

## 2020-10-03 DIAGNOSIS — M869 Osteomyelitis, unspecified: Secondary | ICD-10-CM

## 2020-10-03 DIAGNOSIS — L089 Local infection of the skin and subcutaneous tissue, unspecified: Secondary | ICD-10-CM | POA: Diagnosis not present

## 2020-10-03 DIAGNOSIS — L97509 Non-pressure chronic ulcer of other part of unspecified foot with unspecified severity: Secondary | ICD-10-CM | POA: Diagnosis not present

## 2020-10-04 DIAGNOSIS — M79675 Pain in left toe(s): Secondary | ICD-10-CM | POA: Diagnosis not present

## 2020-10-04 DIAGNOSIS — E11621 Type 2 diabetes mellitus with foot ulcer: Secondary | ICD-10-CM | POA: Insufficient documentation

## 2020-10-13 ENCOUNTER — Encounter: Payer: Self-pay | Admitting: Orthopedic Surgery

## 2020-10-13 NOTE — Progress Notes (Signed)
Office Visit Note   Patient: Alex Jackson           Date of Birth: 1945/05/10           MRN: [id number] Visit Date: 10/03/2020              Requested by: [name], MD [address] [place],  [place] [address] PCP: [name], MD  Chief Complaint  Patient presents with   Left Foot - Pain    Ulcer on great toe      HPI: Patient is a 75 year old gentleman who is seen for evaluation of osteomyelitis left great toe.  Patient was seen in the emergency room last week.  Patient states he was started on antibiotics.  Patient states he had ankle-brachial indices 6 weeks ago and per cardiology he had good circulation.  Assessment & Plan: Visit Diagnoses:  1. Osteomyelitis of great toe of left foot (HCC)     Plan: Discussed with the patient his best option would be to proceed with amputation of the left great toe at the MTP joint secondary to the osteomyelitis.  Patient states he is leaving the country and will be out of the country for 2 weeks and he would like a second opinion.  Patient will continue with his oral antibiotics and follow-up as needed  Follow-Up Instructions: Return if symptoms worsen or fail to improve.   Ortho Exam  Patient is alert, oriented, no adenopathy, well-dressed, normal affect, normal respiratory effort. Examination patient has an ulcer over the tip of the left great toe there is swelling and cellulitis he is on clindamycin.  Review of the radiographs shows lytic changes in the tuft of the left great toe.  Patient states that he is prediabetic.  Last hemoglobin A1c was 3 years ago.  Imaging: No results found. No images are attached to the encounter.  Labs: Lab Results  Component Value Date   HGBA1C 6.1 (H) 09/26/2017   HGBA1C 5.5 03/01/2015     Lab Results  Component Value Date   ALBUMIN 4.3 06/26/2019   ALBUMIN 4.5 04/19/2018   ALBUMIN 4.4 03/16/2016    No results found for: MG No results found for: VD25OH  No  results found for: PREALBUMIN CBC EXTENDED Latest Ref Rng & Units 10/01/2020 06/26/2019 04/19/2018  WBC 4.0 - 10.5 K/uL 9.8 7.4 6.2  RBC 4.22 - 5.81 MIL/uL 3.81(L) 4.30 4.63  HGB 13.0 - 17.0 g/dL 12.3(L) 13.8 12.4(L)  HCT 39.0 - 52.0 % 35.6(L) 39.9 37.8  PLT 150 - 400 K/uL 189 216 251  NEUTROABS 1.4 - 7.0 x10E3/uL - 4.8 4.1  LYMPHSABS 0.7 - 3.1 x10E3/uL - 1.4 1.2     Body mass index is 36.35 kg/m.  Orders:  No orders of the defined types were placed in this encounter.  No orders of the defined types were placed in this encounter.    Procedures: No procedures performed  Clinical Data: No additional findings.  ROS:  All other systems negative, except as noted in the HPI. Review of Systems  Objective: Vital Signs: Ht 6' (1.829 m)   Wt 268 lb (121.6 kg)   BMI 36.35 kg/m   Specialty Comments:  No specialty comments available.  PMFS History: Patient Active Problem List   Diagnosis Date Noted   Piriformis syndrome of left side 07/23/2017   Coronary artery disease involving native coronary artery of native heart with unstable angina pectoris (HCC) 02/11/2017   Lumbar spondylosis 02/11/2017  Iliotibial band syndrome of both sides 02/11/2017   H/O hyperglycemia 02/11/2017   Chronic lower back pain 02/11/2017   Cardiomyopathy, ischemic 09/16/2015   SOB (shortness of breath) 09/16/2015   LV dysfunction 09/16/2015   Hyperlipidemia 03/02/2015   Essential hypertension 03/02/2015   S/P drug eluting coronary stent placement 03/02/2015   NSTEMI (non-ST elevated myocardial infarction) (HCC) 03/01/2015   Past Medical History:  Diagnosis Date   CAD in native artery 03/01/2015   1. cath - DES to LAD residual non obstructive RCA disease in 2016 b. Cath s/p Successful PCI with stenting of the distal RCA into the PLOM with a DES. Balloon angioplasty of the PDA through the stent struts.    Diabetes mellitus without complication (HCC)    Edema of both lower extremities     Hyperlipidemia    Hypertension    Leg weakness    Overweight    Spinal stenosis    Ulcer of great toe (HCC)     Family History  Problem Relation Age of Onset   Thyroid cancer Mother    Liver cancer Father        pancreatic   Arrhythmia Sister     Past Surgical History:  Procedure Laterality Date   CARDIAC CATHETERIZATION N/A 03/01/2015   Procedure: Left Heart Cath and Coronary Angiography;  Surgeon: Kathleene Hazel, MD;  Location: [facility] INVASIVE CV LAB;  Service: Cardiovascular;  Laterality: N/A;   CORONARY STENT INTERVENTION N/A 09/28/2017   Procedure: CORONARY STENT INTERVENTION;  Surgeon: Swaziland, Peter M, MD;  Location: [facility] INVASIVE CV LAB;  Service: Cardiovascular;  Laterality: N/A;   LEFT HEART CATH AND CORONARY ANGIOGRAPHY N/A 09/28/2017   Procedure: LEFT HEART CATH AND CORONARY ANGIOGRAPHY;  Surgeon: Swaziland, Peter M, MD;  Location: [facility] INVASIVE CV LAB;  Service: Cardiovascular;  Laterality: N/A;   NO PAST SURGERIES     Social History   Occupational History   Not on file  Tobacco Use   Smoking status: Former    Packs/day: 1.00    Pack years: 0.00    Types: Cigarettes    Quit date: 07/03/1983    Years since quitting: 37.3   Smokeless tobacco: Never   Tobacco comments:    quit in Mar 1985  Vaping Use   Vaping Use: Never used  Substance and Sexual Activity   Alcohol use: Yes    Alcohol/week: 0.0 standard drinks    Comment: "one or two drinks a night"   Drug use: No   Sexual activity: Not on file

## 2020-10-14 ENCOUNTER — Other Ambulatory Visit: Payer: Self-pay | Admitting: Physician Assistant

## 2020-10-14 DIAGNOSIS — Z955 Presence of coronary angioplasty implant and graft: Secondary | ICD-10-CM

## 2020-10-14 DIAGNOSIS — I25110 Atherosclerotic heart disease of native coronary artery with unstable angina pectoris: Secondary | ICD-10-CM

## 2020-10-14 DIAGNOSIS — E785 Hyperlipidemia, unspecified: Secondary | ICD-10-CM

## 2020-10-14 DIAGNOSIS — I10 Essential (primary) hypertension: Secondary | ICD-10-CM

## 2020-10-14 DIAGNOSIS — I2510 Atherosclerotic heart disease of native coronary artery without angina pectoris: Secondary | ICD-10-CM

## 2020-10-14 DIAGNOSIS — I255 Ischemic cardiomyopathy: Secondary | ICD-10-CM

## 2020-10-16 DIAGNOSIS — M5137 Other intervertebral disc degeneration, lumbosacral region: Secondary | ICD-10-CM | POA: Diagnosis not present

## 2020-10-18 DIAGNOSIS — M79675 Pain in left toe(s): Secondary | ICD-10-CM | POA: Diagnosis not present

## 2020-10-18 DIAGNOSIS — E11621 Type 2 diabetes mellitus with foot ulcer: Secondary | ICD-10-CM | POA: Diagnosis not present

## 2020-10-21 ENCOUNTER — Other Ambulatory Visit: Payer: Self-pay

## 2020-10-21 ENCOUNTER — Telehealth: Payer: Self-pay | Admitting: Orthopedic Surgery

## 2020-10-21 NOTE — Telephone Encounter (Signed)
  [facility] team [employer] Appeals and grievances   Dear Shanda Bumps I am responding to a letter dated 10/15/2020 regarding a grievance received by health team advantage from Alex Jackson regarding his office visit on 10/03/2020.  Patient's date of birth 01/11/1946, member ID [id number].  Alex Jackson was seen in the emergency room on 10/01/2020 with an infection and ulceration of his left great toe.  My office agreed to see Alex Jackson urgently with concerns of the infection spreading from the toe and causing infection of the foot.  Patient was scheduled for an appointment on 10/03/2020 at 8:30 AM.  Patient called and canceled his appointment and we rescheduled the patient for an appointment the same afternoon.  Patient was worked into a busy schedule so he did have to wait 45 minutes.  My concern was that if patient did have a serious infection I did not want any delays in his care or treatment to adversely affect him.  During patient's examination the ulcer was probed with the wooden end of a sterile Q-tip.  This ulcer did probe down to bone.  I agree, this was not a disinfected instrument but it was a sterile Q-tip.  I agree, that I was able to scrape the bone indicating that the bone was infected.  With a ulcer that touched bone, radiographic changes showing lytic changes in the bone, and elevated white blood cell count of 9.8, his clinical findings were consistent with osteomyelitis of the tuft of the great toe.  Patient states that he was leaving on a 2-week cruise. I stated that with him continuing the antibiotics, and the wound  open to drain the infection, I did not feel that he was at increased risk of developing a systemic infection.  I felt that it was appropriate to wait 2 weeks, while he was on vacation. I discussed that in my opinion his best option would be to proceed with surgical amputation of the great toe at the MTP joint and I drew a line on his toe indicating the level of the  incision.  In my medical opinion his safest treatment option was to proceed with an amputation of the great toe.    Patient stated he would like to consider other treatment options, and I encouraged that he get a second opinion.  I completely understand patient's concern and apprehension regarding amputation of his great toe.  I remain available to help the patient with his medical condition.    Very truly yours,  Alex Baker MD

## 2020-10-22 DIAGNOSIS — M545 Low back pain, unspecified: Secondary | ICD-10-CM | POA: Diagnosis not present

## 2020-10-24 DIAGNOSIS — M545 Low back pain, unspecified: Secondary | ICD-10-CM | POA: Diagnosis not present

## 2020-10-29 ENCOUNTER — Encounter: Payer: Self-pay | Admitting: Cardiovascular Disease

## 2020-10-29 ENCOUNTER — Ambulatory Visit: Payer: PPO | Admitting: Cardiovascular Disease

## 2020-10-29 ENCOUNTER — Other Ambulatory Visit: Payer: Self-pay

## 2020-10-29 VITALS — BP 128/68 | HR 65 | Resp 18 | Ht 71.0 in | Wt 273.0 lb

## 2020-10-29 DIAGNOSIS — E785 Hyperlipidemia, unspecified: Secondary | ICD-10-CM | POA: Diagnosis not present

## 2020-10-29 DIAGNOSIS — I10 Essential (primary) hypertension: Secondary | ICD-10-CM | POA: Diagnosis not present

## 2020-10-29 DIAGNOSIS — I2510 Atherosclerotic heart disease of native coronary artery without angina pectoris: Secondary | ICD-10-CM

## 2020-10-29 DIAGNOSIS — L97529 Non-pressure chronic ulcer of other part of left foot with unspecified severity: Secondary | ICD-10-CM

## 2020-10-29 DIAGNOSIS — M545 Low back pain, unspecified: Secondary | ICD-10-CM | POA: Diagnosis not present

## 2020-10-29 NOTE — Progress Notes (Signed)
Cardiology Office Note   Date:  10/29/2020   ID:  Alex, Jackson 1945/10/21, MRN [id number]  PCP:  [name], MD  Cardiologist: Previously managed by Dr. Delton See   No chief complaint on file.     History of Present Illness: Alex Jackson is a 75 y.o. male who was referred by Dr. Victorino Jackson for evaluation of possible peripheral arterial disease given slow healing ulceration involving the left big toe.  He has known history of coronary artery disease, essential hypertension, hyperlipidemia, chronic diastolic heart failure and diabetes mellitus.  He had non-STEMI in 2016 and underwent PCI of the LAD.  His ejection fraction was moderately reduced but subsequently normalized.  He had recurrent non-STEMI in May 2019 and had PCI of the the distal right coronary artery with balloon angioplasty of the PDA through the stent struts.  He had lower extremity arterial Doppler in September 2021 which showed normal ABI bilaterally with mildly abnormal TBI.  He reports an injury to her left big toe with subsequent wound that involves the whole toe.  He was suspected of having osteomyelitis and had an emergency room visit in May.  He was treated with antibiotics.  He was seen by Dr. Lajoyce Jackson who recommended to amputation but the patient was very upset and requested a second opinion.  He reports gradual improvement with wound care and antibiotic treatment.  He has no significant leg claudication.  No chest pain or shortness of breath.  He is not a smoker.  Past Medical History:  Diagnosis Date   CAD in native artery 03/01/2015   1. cath - DES to LAD residual non obstructive RCA disease in 2016 b. Cath s/p Successful PCI with stenting of the distal RCA into the PLOM with a DES. Balloon angioplasty of the PDA through the stent struts.    Diabetes mellitus without complication (HCC)    Edema of both lower extremities    Hyperlipidemia    Hypertension    Leg weakness    Overweight    Spinal  stenosis    Ulcer of great toe [facility])     Past Surgical History:  Procedure Laterality Date   CARDIAC CATHETERIZATION N/A 03/01/2015   Procedure: Left Heart Cath and Coronary Angiography;  Surgeon: Kathleene Hazel, MD;  Location: [facility] INVASIVE CV LAB;  Service: Cardiovascular;  Laterality: N/A;   CORONARY STENT INTERVENTION N/A 09/28/2017   Procedure: CORONARY STENT INTERVENTION;  Surgeon: Swaziland, Peter M, MD;  Location: [facility] INVASIVE CV LAB;  Service: Cardiovascular;  Laterality: N/A;   LEFT HEART CATH AND CORONARY ANGIOGRAPHY N/A 09/28/2017   Procedure: LEFT HEART CATH AND CORONARY ANGIOGRAPHY;  Surgeon: Swaziland, Peter M, MD;  Location: [facility] INVASIVE CV LAB;  Service: Cardiovascular;  Laterality: N/A;   NO PAST SURGERIES       Current Outpatient Medications  Medication Sig Dispense Refill   acetaminophen (TYLENOL) 325 MG tablet Take 2 tablets (650 mg total) by mouth every 4 (four) hours as needed for headache or mild pain.     aspirin EC 81 MG tablet Take 81 mg by mouth daily.     bisoprolol (ZEBETA) 10 MG tablet TAKE ONE TABLET BY MOUTH ONCE DAILY 90 tablet 3   chlorthalidone (HYGROTON) 25 MG tablet Take 1 tablet (25 mg total) by mouth daily. 90 tablet 3   clindamycin (CLEOCIN) 150 MG capsule Take 1 capsule (150 mg total) by mouth every 6 (six) hours. 28 capsule 0   DEXILANT 60 MG capsule  Take 60 mg by mouth daily.     fluticasone (FLONASE) 50 MCG/ACT nasal spray USE 1 SPRAY IN EACH NOSTRIL EVERY DAY  4   furosemide (LASIX) 40 MG tablet Take 1 tablet (40 mg total) by mouth daily. 90 tablet 3   hydroxypropyl methylcellulose / hypromellose (ISOPTO TEARS / GONIOVISC) 2.5 % ophthalmic solution Place 1 drop into both eyes as needed for dry eyes.     lisinopril (ZESTRIL) 40 MG tablet Take 1 tablet (40 mg total) by mouth daily. 90 tablet 3   loteprednol (LOTEMAX) 0.5 % ophthalmic suspension      metFORMIN (GLUCOPHAGE) 500 MG tablet Take 1 tablet (500 mg total) by mouth 2 (two) times daily.  5    Multiple Vitamin (MULTIVITAMIN WITH MINERALS) TABS tablet Take 1 tablet by mouth daily.     nitroGLYCERIN (NITROSTAT) 0.4 MG SL tablet Place 1 tablet (0.4 mg total) under the tongue every 5 (five) minutes x 3 doses as needed for chest pain. 25 tablet 4   omega-3 acid ethyl esters (LOVAZA) 1 g capsule TAKE ONE CAPSULE BY MOUTH TWICE DAILY 180 capsule 3   pregabalin (LYRICA) 75 MG capsule Take 1 capsule (75 mg total) by mouth 2 (two) times daily. 60 capsule 11   rosuvastatin (CRESTOR) 10 MG tablet TAKE ONE TABLET BY MOUTH ONCE DAILY 90 tablet 3   No current facility-administered medications for this visit.    Allergies:   Prednisone, Atorvastatin, and Pravastatin    Social History:  The patient  reports that he quit smoking about 37 years ago. His smoking use included cigarettes. He smoked an average of 1.00 packs per day. He has never used smokeless tobacco. He reports current alcohol use. He reports that he does not use drugs.   Family History:  The patient's family history includes Arrhythmia in his sister; Liver cancer in his father; Thyroid cancer in his mother.    ROS:  Please see the history of present illness.   Otherwise, review of systems are positive for none.   All other systems are reviewed and negative.    PHYSICAL EXAM: VS:  BP 128/68 (BP Location: Left Arm, Patient Position: Sitting, Cuff Size: Normal)   Pulse 65   Resp 18   Ht 5\' 11"  (1.803 m)   Wt 273 lb (123.8 kg)   SpO2 96%   BMI 38.08 kg/m  , BMI Body mass index is 38.08 kg/m. GEN: Well nourished, well developed, in no acute distress  HEENT: normal  Neck: no JVD, carotid bruits, or masses Cardiac: RRR; no murmurs, rubs, or gallops,no edema  Respiratory:  clear to auscultation bilaterally, normal work of breathing GI: soft, nontender, nondistended, + BS MS: no deformity or atrophy  Skin: warm and dry, no rash Neuro:  Strength and sensation are intact Psych: euthymic mood, full affect Vascular: Femoral  pulses +2 bilaterally.  Dorsalis pedis and posterior tibial is palpable bilaterally.   EKG:  EKG is not ordered today.    Recent Labs: 10/01/2020: BUN 26; Creatinine, Ser 0.93; Hemoglobin 12.3; Platelets 189; Potassium 3.8; Sodium 136    Lipid Panel    Component Value Date/Time   CHOL 141 06/26/2019 0845   TRIG 242 (H) 06/26/2019 0845   HDL 32 (L) 06/26/2019 0845   CHOLHDL 4.4 06/26/2019 0845   CHOLHDL 5.4 09/27/2017 0222   VLDL UNABLE TO CALCULATE IF TRIGLYCERIDE OVER 400 mg/dL 09/29/2017 84/66/5993   LDLCALC 69 06/26/2019 0845      Wt Readings from Last 3  Encounters:  10/29/20 273 lb (123.8 kg)  10/03/20 268 lb (121.6 kg)  10/01/20 268 lb (121.6 kg)      No flowsheet data found.    ASSESSMENT AND PLAN:  1.  Left big toe wound and possible osteomyelitis: There is concern about possible peripheral arterial disease contributing to slow healing.  His lower extremity arterial Doppler last year was unremarkable and by exam he does have distal pedal pulses.  I am going to repeat his lower extremity arterial Doppler to ensure adequate to perfusion.  2.  Coronary artery disease involving native coronary arteries without angina: He is doing reasonably well.  Continue medical therapy.  3.  Hyperlipidemia: I reviewed most recent lipid profile which showed an LDL of 69.  I recommend continuing rosuvastatin 10 mg daily.  If triglycerides continue to be elevated, consider adding Vascepa.  4.  Essential hypertension.  Blood pressure is controlled.  Continue bisoprolol, lisinopril and chlorthalidone.    Disposition:   FU with me as needed  Signed,  Lorine Bears, MD  10/29/2020 8:58 AM    [facility]

## 2020-10-29 NOTE — Patient Instructions (Signed)
Medication Instructions:  No changes *If you need a refill on your cardiac medications before your next appointment, please call your pharmacy*   Lab Work: None ordered If you have labs (blood work) drawn today and your tests are completely normal, you will receive your results only by: MyChart Message (if you have MyChart) OR A paper copy in the mail If you have any lab test that is abnormal or we need to change your treatment, we will call you to review the results.   Testing/Procedures: Your physician has requested that you have a lower extremity segmental doppler. This will take place at [address], [address].   Follow-Up: At [facility], you and your health needs are our priority.  As part of our continuing mission to provide you with exceptional heart care, we have created designated Provider Care Teams.  These Care Teams include your primary Cardiologist (physician) and Advanced Practice Providers (APPs -  Physician Assistants and Nurse Practitioners) who all work together to provide you with the care you need, when you need it.  We recommend signing up for the patient portal called "MyChart".  Sign up information is provided on this After Visit Summary.  MyChart is used to connect with patients for Virtual Visits (Telemedicine).  Patients are able to view lab/test results, encounter notes, upcoming appointments, etc.  Non-urgent messages can be sent to your provider as well.   To learn more about what you can do with MyChart, go to [email or web address].    Your next appointment:   Follow up as needed with Dr. Kirke Corin

## 2020-10-30 ENCOUNTER — Telehealth: Payer: Self-pay | Admitting: Cardiology

## 2020-10-30 MED ORDER — PANTOPRAZOLE SODIUM 40 MG PO TBEC: 40.0000 mg | DELAYED_RELEASE_TABLET | Freq: Every day | ORAL | 1 refills | Status: DC

## 2020-10-30 NOTE — Telephone Encounter (Signed)
Returned call to Pt.  Per Pt-he was on protonix prescribed by Dr. Delton See and was doing fine on that medication.  He states his ENT decided he would do better on myrbetriq and discontinued the protonix and started him on that medication.  Per Pt his copay for myrbetriq is hundreds of dollars and he cannot afford it.  He is requesting a presciption for protonix.  Advised that Dr. Delton See is no longer with practice and Pt will now be following with Dr. Shari Prows.  Advised would send to her nurse to discuss.  Pt indicates understanding, awaits call back.

## 2020-10-30 NOTE — Telephone Encounter (Signed)
Pt is calling to ask the nurse a few question about his medications

## 2020-10-30 NOTE — Addendum Note (Signed)
Addended by: Loa Socks on: 10/30/2020 02:37 PM   Modules accepted: Orders

## 2020-10-30 NOTE — Telephone Encounter (Signed)
Left the pt a message to call the office back. 

## 2020-10-30 NOTE — Telephone Encounter (Signed)
Dexilant was discontinued from pts med list for he will now be taking Protonix 40 mg po daily now.  He is aware of this. He is no longer taking dexilant anymore.

## 2020-10-30 NOTE — Telephone Encounter (Signed)
Spoke with the pt and informed him that I spoke with Dr. Shari Prows about request to get his protonix 40 mg po daily refilled and added back to his med list.  Informed the pt that Dr. Shari Prows agreed to refill this medication, but he should update his allergist about this, so that he can be aware.  Confirmed the pharmacy of choice with the pt.  Pt verbalized understanding and agrees with this plan.

## 2020-11-01 DIAGNOSIS — M545 Low back pain, unspecified: Secondary | ICD-10-CM | POA: Diagnosis not present

## 2020-11-05 DIAGNOSIS — M545 Low back pain, unspecified: Secondary | ICD-10-CM | POA: Diagnosis not present

## 2020-11-05 DIAGNOSIS — R972 Elevated prostate specific antigen [PSA]: Secondary | ICD-10-CM | POA: Diagnosis not present

## 2020-11-07 ENCOUNTER — Other Ambulatory Visit: Payer: Self-pay | Admitting: Cardiovascular Disease

## 2020-11-07 DIAGNOSIS — L97529 Non-pressure chronic ulcer of other part of left foot with unspecified severity: Secondary | ICD-10-CM

## 2020-11-07 DIAGNOSIS — M545 Low back pain, unspecified: Secondary | ICD-10-CM | POA: Diagnosis not present

## 2020-11-12 DIAGNOSIS — M545 Low back pain, unspecified: Secondary | ICD-10-CM | POA: Diagnosis not present

## 2020-11-14 DIAGNOSIS — M545 Low back pain, unspecified: Secondary | ICD-10-CM | POA: Diagnosis not present

## 2020-11-15 DIAGNOSIS — E11621 Type 2 diabetes mellitus with foot ulcer: Secondary | ICD-10-CM | POA: Diagnosis not present

## 2020-11-18 ENCOUNTER — Ambulatory Visit (HOSPITAL_COMMUNITY)
Admission: RE | Admit: 2020-11-18 | Discharge: 2020-11-18 | Disposition: A | Discharge status: Home / Self Care | Payer: PPO | Source: Ambulatory Visit | Attending: Cardiovascular Disease | Admitting: Cardiovascular Disease

## 2020-11-18 ENCOUNTER — Other Ambulatory Visit: Payer: Self-pay

## 2020-11-18 DIAGNOSIS — L97529 Non-pressure chronic ulcer of other part of left foot with unspecified severity: Secondary | ICD-10-CM | POA: Diagnosis not present

## 2020-11-20 DIAGNOSIS — M545 Low back pain, unspecified: Secondary | ICD-10-CM | POA: Diagnosis not present

## 2020-11-21 DIAGNOSIS — M545 Low back pain, unspecified: Secondary | ICD-10-CM | POA: Diagnosis not present

## 2020-11-22 DIAGNOSIS — M545 Low back pain, unspecified: Secondary | ICD-10-CM | POA: Diagnosis not present

## 2020-11-26 DIAGNOSIS — M545 Low back pain, unspecified: Secondary | ICD-10-CM | POA: Diagnosis not present

## 2020-11-26 DIAGNOSIS — E1169 Type 2 diabetes mellitus with other specified complication: Secondary | ICD-10-CM | POA: Diagnosis not present

## 2020-11-26 DIAGNOSIS — L97509 Non-pressure chronic ulcer of other part of unspecified foot with unspecified severity: Secondary | ICD-10-CM | POA: Diagnosis not present

## 2020-12-03 DIAGNOSIS — M545 Low back pain, unspecified: Secondary | ICD-10-CM | POA: Diagnosis not present

## 2020-12-05 DIAGNOSIS — M545 Low back pain, unspecified: Secondary | ICD-10-CM | POA: Diagnosis not present

## 2020-12-09 DIAGNOSIS — M545 Low back pain, unspecified: Secondary | ICD-10-CM | POA: Diagnosis not present

## 2020-12-12 DIAGNOSIS — M545 Low back pain, unspecified: Secondary | ICD-10-CM | POA: Diagnosis not present

## 2020-12-13 DIAGNOSIS — E11621 Type 2 diabetes mellitus with foot ulcer: Secondary | ICD-10-CM | POA: Diagnosis not present

## 2020-12-20 DIAGNOSIS — M545 Low back pain, unspecified: Secondary | ICD-10-CM | POA: Diagnosis not present

## 2020-12-25 DIAGNOSIS — M545 Low back pain, unspecified: Secondary | ICD-10-CM | POA: Diagnosis not present

## 2020-12-27 DIAGNOSIS — M545 Low back pain, unspecified: Secondary | ICD-10-CM | POA: Diagnosis not present

## 2021-01-01 DIAGNOSIS — M545 Low back pain, unspecified: Secondary | ICD-10-CM | POA: Diagnosis not present

## 2021-01-03 DIAGNOSIS — M545 Low back pain, unspecified: Secondary | ICD-10-CM | POA: Diagnosis not present

## 2021-01-08 DIAGNOSIS — M545 Low back pain, unspecified: Secondary | ICD-10-CM | POA: Diagnosis not present

## 2021-01-13 DIAGNOSIS — E11621 Type 2 diabetes mellitus with foot ulcer: Secondary | ICD-10-CM | POA: Diagnosis not present

## 2021-01-14 ENCOUNTER — Other Ambulatory Visit: Payer: Self-pay

## 2021-01-14 DIAGNOSIS — M545 Low back pain, unspecified: Secondary | ICD-10-CM | POA: Diagnosis not present

## 2021-01-14 NOTE — Telephone Encounter (Signed)
Pt's pharmacy is requesting a refill on pregabalin (Lyrica) 75 mg tablet. Would Dr. Shari Prows like to refill this medication? Please address

## 2021-01-14 NOTE — Telephone Encounter (Signed)
*  STAT* If patient is at the pharmacy, call can be transferred to refill team.   1. Which medications need to be refilled? (please list name of each medication and dose if known) Pregabalin  2. Which pharmacy/location (including street and city if local pharmacy) is medication to be sent to? [facility]   i Do they need a 30 day or 90 day supply? 90 days and refills

## 2021-01-15 MED ORDER — PREGABALIN 75 MG PO CAPS: 75.0000 mg | ORAL_CAPSULE | Freq: Two times a day (BID) | ORAL | 0 refills | Status: DC

## 2021-01-15 NOTE — Addendum Note (Signed)
Addended by: Margaret Pyle D on: 01/15/2021 08:42 AM   Modules accepted: Orders

## 2021-01-15 NOTE — Telephone Encounter (Signed)
I called [facility] to give a verbal order over the phone for pt medication pregabalin (Lyrica) 75 mg capsule, dispensing 60 capsules with no refills. Pharmacy tech verbalized understanding and stated that they would get pt's medication ready for pt to pick up.

## 2021-01-15 NOTE — Telephone Encounter (Signed)
Refill for one month, then further refills should come from the pts PCP thereafter.

## 2021-01-15 NOTE — Addendum Note (Signed)
Addended by: Margaret Pyle D on: 01/15/2021 08:18 AM   Modules accepted: Orders

## 2021-01-22 DIAGNOSIS — M545 Low back pain, unspecified: Secondary | ICD-10-CM | POA: Diagnosis not present

## 2021-01-29 DIAGNOSIS — M545 Low back pain, unspecified: Secondary | ICD-10-CM | POA: Diagnosis not present

## 2021-01-31 DIAGNOSIS — M545 Low back pain, unspecified: Secondary | ICD-10-CM | POA: Diagnosis not present

## 2021-02-03 DIAGNOSIS — M545 Low back pain, unspecified: Secondary | ICD-10-CM | POA: Diagnosis not present

## 2021-02-04 ENCOUNTER — Other Ambulatory Visit: Payer: Self-pay | Admitting: Sports Medicine

## 2021-02-04 DIAGNOSIS — M1611 Unilateral primary osteoarthritis, right hip: Secondary | ICD-10-CM | POA: Diagnosis not present

## 2021-02-04 DIAGNOSIS — M48061 Spinal stenosis, lumbar region without neurogenic claudication: Secondary | ICD-10-CM | POA: Diagnosis not present

## 2021-02-04 DIAGNOSIS — M25551 Pain in right hip: Secondary | ICD-10-CM

## 2021-02-06 DIAGNOSIS — M545 Low back pain, unspecified: Secondary | ICD-10-CM | POA: Diagnosis not present

## 2021-02-10 ENCOUNTER — Other Ambulatory Visit: Payer: Self-pay

## 2021-02-10 ENCOUNTER — Ambulatory Visit
Admission: RE | Admit: 2021-02-10 | Discharge: 2021-02-10 | Disposition: A | Discharge status: Home / Self Care | Payer: PPO | Source: Ambulatory Visit | Attending: Sports Medicine | Admitting: Sports Medicine

## 2021-02-10 DIAGNOSIS — M25551 Pain in right hip: Secondary | ICD-10-CM | POA: Diagnosis not present

## 2021-02-10 MED ORDER — METHYLPREDNISOLONE ACETATE 40 MG/ML INJ SUSP (RADIOLOG
80.0000 mg | Freq: Once | INTRAMUSCULAR | Status: AC
  Administered 2021-02-10: 80 mg via INTRA_ARTICULAR

## 2021-02-10 MED ORDER — IOPAMIDOL (ISOVUE-M 200) INJECTION 41%
1.0000 mL | Freq: Once | INTRAMUSCULAR | Status: AC
  Administered 2021-02-10: 1 mL via INTRA_ARTICULAR

## 2021-02-11 DIAGNOSIS — M545 Low back pain, unspecified: Secondary | ICD-10-CM | POA: Diagnosis not present

## 2021-02-13 DIAGNOSIS — M25462 Effusion, left knee: Secondary | ICD-10-CM | POA: Diagnosis not present

## 2021-02-13 DIAGNOSIS — S83282A Other tear of lateral meniscus, current injury, left knee, initial encounter: Secondary | ICD-10-CM | POA: Diagnosis not present

## 2021-02-16 ENCOUNTER — Emergency Department (HOSPITAL_BASED_OUTPATIENT_CLINIC_OR_DEPARTMENT_OTHER): Payer: PPO

## 2021-02-16 ENCOUNTER — Encounter (HOSPITAL_BASED_OUTPATIENT_CLINIC_OR_DEPARTMENT_OTHER): Payer: Self-pay | Admitting: Emergency Medicine

## 2021-02-16 ENCOUNTER — Emergency Department (HOSPITAL_BASED_OUTPATIENT_CLINIC_OR_DEPARTMENT_OTHER)
Admission: EM | Admit: 2021-02-16 | Discharge: 2021-02-16 | Disposition: A | Discharge status: Home / Self Care | Payer: PPO | Attending: Emergency Medicine | Admitting: Emergency Medicine

## 2021-02-16 ENCOUNTER — Other Ambulatory Visit: Payer: Self-pay

## 2021-02-16 DIAGNOSIS — I10 Essential (primary) hypertension: Secondary | ICD-10-CM | POA: Insufficient documentation

## 2021-02-16 DIAGNOSIS — R238 Other skin changes: Secondary | ICD-10-CM

## 2021-02-16 DIAGNOSIS — Z7984 Long term (current) use of oral hypoglycemic drugs: Secondary | ICD-10-CM | POA: Diagnosis not present

## 2021-02-16 DIAGNOSIS — M7989 Other specified soft tissue disorders: Secondary | ICD-10-CM

## 2021-02-16 DIAGNOSIS — M25461 Effusion, right knee: Secondary | ICD-10-CM | POA: Diagnosis not present

## 2021-02-16 DIAGNOSIS — L539 Erythematous condition, unspecified: Secondary | ICD-10-CM | POA: Diagnosis present

## 2021-02-16 DIAGNOSIS — Z79899 Other long term (current) drug therapy: Secondary | ICD-10-CM | POA: Diagnosis not present

## 2021-02-16 DIAGNOSIS — M79651 Pain in right thigh: Secondary | ICD-10-CM | POA: Diagnosis not present

## 2021-02-16 DIAGNOSIS — L03116 Cellulitis of left lower limb: Secondary | ICD-10-CM | POA: Diagnosis not present

## 2021-02-16 DIAGNOSIS — E119 Type 2 diabetes mellitus without complications: Secondary | ICD-10-CM | POA: Diagnosis not present

## 2021-02-16 DIAGNOSIS — Z7982 Long term (current) use of aspirin: Secondary | ICD-10-CM | POA: Insufficient documentation

## 2021-02-16 DIAGNOSIS — M25462 Effusion, left knee: Secondary | ICD-10-CM | POA: Diagnosis not present

## 2021-02-16 DIAGNOSIS — L03115 Cellulitis of right lower limb: Secondary | ICD-10-CM | POA: Diagnosis not present

## 2021-02-16 DIAGNOSIS — I2510 Atherosclerotic heart disease of native coronary artery without angina pectoris: Secondary | ICD-10-CM | POA: Diagnosis not present

## 2021-02-16 DIAGNOSIS — M25551 Pain in right hip: Secondary | ICD-10-CM | POA: Insufficient documentation

## 2021-02-16 DIAGNOSIS — Z87891 Personal history of nicotine dependence: Secondary | ICD-10-CM | POA: Diagnosis not present

## 2021-02-16 LAB — CBC WITH DIFFERENTIAL/PLATELET
Abs Immature Granulocytes: 0.18 10*3/uL — ABNORMAL HIGH (ref 0.00–0.07)
Basophils Absolute: 0.1 10*3/uL (ref 0.0–0.1)
Basophils Relative: 1 %
Eosinophils Absolute: 0.3 10*3/uL (ref 0.0–0.5)
Eosinophils Relative: 2 %
HCT: 42 % (ref 39.0–52.0)
Hemoglobin: 14.1 g/dL (ref 13.0–17.0)
Immature Granulocytes: 2 %
Lymphocytes Relative: 16 %
Lymphs Abs: 1.8 10*3/uL (ref 0.7–4.0)
MCH: 31.5 pg (ref 26.0–34.0)
MCHC: 33.6 g/dL (ref 30.0–36.0)
MCV: 93.8 fL (ref 80.0–100.0)
Monocytes Absolute: 1.3 10*3/uL — ABNORMAL HIGH (ref 0.1–1.0)
Monocytes Relative: 11 %
Neutro Abs: 7.9 10*3/uL — ABNORMAL HIGH (ref 1.7–7.7)
Neutrophils Relative %: 68 %
Platelets: 225 10*3/uL (ref 150–400)
RBC: 4.48 MIL/uL (ref 4.22–5.81)
RDW: 13.2 % (ref 11.5–15.5)
WBC: 11.5 10*3/uL — ABNORMAL HIGH (ref 4.0–10.5)
nRBC: 0 % (ref 0.0–0.2)

## 2021-02-16 LAB — BASIC METABOLIC PANEL
Anion gap: 8 (ref 5–15)
BUN: 27 mg/dL — ABNORMAL HIGH (ref 8–23)
CO2: 28 mmol/L (ref 22–32)
Calcium: 9.5 mg/dL (ref 8.9–10.3)
Chloride: 99 mmol/L (ref 98–111)
Creatinine, Ser: 0.89 mg/dL (ref 0.61–1.24)
GFR, Estimated: >60 mL/min (ref >60)
Glucose, Bld: 112 mg/dL — ABNORMAL HIGH (ref 70–99)
Potassium: 4.1 mmol/L (ref 3.5–5.1)
Sodium: 135 mmol/L (ref 135–145)

## 2021-02-16 MED ORDER — DOXYCYCLINE HYCLATE 100 MG PO CAPS: 100.0000 mg | ORAL_CAPSULE | Freq: Two times a day (BID) | ORAL | 0 refills | Status: AC

## 2021-02-16 MED ORDER — CYCLOBENZAPRINE HCL 10 MG PO TABS: 10.0000 mg | ORAL_TABLET | Freq: Two times a day (BID) | ORAL | 0 refills | Status: DC | PRN

## 2021-02-16 MED ORDER — DOXYCYCLINE HYCLATE 100 MG PO TABS
100.0000 mg | ORAL_TABLET | Freq: Once | ORAL | Status: AC
  Administered 2021-02-16: 100 mg via ORAL
  Filled 2021-02-16: qty 1

## 2021-02-16 NOTE — Discharge Instructions (Signed)
You were diagnosed with possible cellulitis or an infection of the left lower leg.  We talked about the differential acute which could also include a drug rash.  I recommend that you stop your Zanaflex and Voltaren medications.  Instead I prescribed a different muscle relaxer called Flexeril.  You can continue take Tylenol and Aleve as needed for pain at home.  I expect the redness to improve over the next few days.  Should not continue spreading up your leg.  If the redness spreading further up your leg, or you begin having fevers at home, or sudden worsening pain in your leg, you need to come back to the ER immediately.  We did not see any signs of blood clots in your DVT ultrasound today.

## 2021-02-16 NOTE — ED Triage Notes (Signed)
Pt from home and on thursday had fluid drawn from his left knee. This morning Pt reports left sided lower extremity redness with rash like appearance. Pt denis pain, but complains hot to the touch.

## 2021-02-16 NOTE — ED Provider Notes (Signed)
[facility] EMERGENCY DEPT Provider Note   CSN: [id number] Arrival date & time: 02/16/21  1030     History Chief Complaint  Patient presents with   Knee Pain    Alex Jackson is a 75 y.o. male with history of prediabetes, hypertension, obesity, osteoarthritis of the joints, presented to ED with left lower leg redness and swelling.  The patient and his daughter at bedside provide history.  He reports that he saw his orthopedist and had an injection in his right hip about 1 week ago.  Approximately 3 days ago he went back to the orthopedist as he was having issues where his left leg was "locking up".  He reports that the orthopedist aspirated about "two full syringes of bloody fluid from my knee."  He felt better afterwards.  His doctor started him on Voltaren tablets as well as Zanaflex, both of which were new medications for him.  He took these medications 2 days ago.  This morning when he woke up his daughter noted that he had redness and warmth of his left lower leg.  He was also complaining of worsening pain in his right hip and upper posterior right thigh.  He denies any prior history of cellulitis in his legs or blood clots.  His daughter is concerned that there may be a blood clot, citing his "sedentary lifestyle".  Patient denies any chest pain or pressure.  He denies any fever at home.  He says otherwise he feels well.  He is able to range his left knee and says the pain in his knee has improved since the aspiration.  HPI     Past Medical History:  Diagnosis Date   CAD in native artery 03/01/2015   1. cath - DES to LAD residual non obstructive RCA disease in 2016 b. Cath s/p Successful PCI with stenting of the distal RCA into the PLOM with a DES. Balloon angioplasty of the PDA through the stent struts.    Diabetes mellitus without complication (HCC)    Edema of both lower extremities    Hyperlipidemia    Hypertension    Leg weakness    Overweight    Spinal  stenosis    Ulcer of great toe [facility])     Patient Active Problem List   Diagnosis Date Noted   Piriformis syndrome of left side 07/23/2017   Coronary artery disease involving native coronary artery of native heart with unstable angina pectoris (HCC) 02/11/2017   Lumbar spondylosis 02/11/2017   Iliotibial band syndrome of both sides 02/11/2017   H/O hyperglycemia 02/11/2017   Chronic lower back pain 02/11/2017   Cardiomyopathy, ischemic 09/16/2015   SOB (shortness of breath) 09/16/2015   LV dysfunction 09/16/2015   Hyperlipidemia 03/02/2015   Essential hypertension 03/02/2015   S/P drug eluting coronary stent placement 03/02/2015   NSTEMI (non-ST elevated myocardial infarction) (HCC) 03/01/2015    Past Surgical History:  Procedure Laterality Date   CARDIAC CATHETERIZATION N/A 03/01/2015   Procedure: Left Heart Cath and Coronary Angiography;  Surgeon: Kathleene Hazel, MD;  Location[facility] INVASIVE CV LAB;  Service: Cardiovascular;  Laterality: N/A;   CORONARY STENT INTERVENTION N/A 09/28/2017   Procedure: CORONARY STENT INTERVENTION;  Surgeon: Swaziland, Peter M, MD;  Location: [facility] INVASIVE CV LAB;  Service: Cardiovascular;  Laterality: N/A;   LEFT HEART CATH AND CORONARY ANGIOGRAPHY N/A 09/28/2017   Procedure: LEFT HEART CATH AND CORONARY ANGIOGRAPHY;  Surgeon: Swaziland, Peter M, MD;  Location: [facility] INVASIVE CV LAB;  Service:  Cardiovascular;  Laterality: N/A;   NO PAST SURGERIES         Family History  Problem Relation Age of Onset   Thyroid cancer Mother    Liver cancer Father        pancreatic   Arrhythmia Sister     Social History   Tobacco Use   Smoking status: Former    Packs/day: 1.00    Types: Cigarettes    Quit date: 07/03/1983    Years since quitting: 37.6   Smokeless tobacco: Never   Tobacco comments:    quit in Mar 1985  Vaping Use   Vaping Use: Never used  Substance Use Topics   Alcohol use: Yes    Alcohol/week: 0.0 standard drinks    Comment: "one or two  drinks a night"   Drug use: No    Home Medications Prior to Admission medications   Medication Sig Start Date End Date Taking? Authorizing Provider  cyclobenzaprine (FLEXERIL) 10 MG tablet Take 1 tablet (10 mg total) by mouth 2 (two) times daily as needed for up to 20 doses for muscle spasms. 02/16/21  Yes Adream Parzych, Kermit Balo, MD  doxycycline (VIBRAMYCIN) 100 MG capsule Take 1 capsule (100 mg total) by mouth 2 (two) times daily for 7 days. 02/16/21 02/23/21 Yes Torben Soloway, Kermit Balo, MD  acetaminophen (TYLENOL) 325 MG tablet Take 2 tablets (650 mg total) by mouth every 4 (four) hours as needed for headache or mild pain. 09/29/17   Leone Brand, NP  aspirin EC 81 MG tablet Take 81 mg by mouth daily.    [provider]  bisoprolol (ZEBETA) 10 MG tablet TAKE ONE TABLET BY MOUTH ONCE DAILY 10/14/20   Bhagat, Sharrell Ku, PA  chlorthalidone (HYGROTON) 25 MG tablet Take 1 tablet (25 mg total) by mouth daily. 06/24/20 06/19/21  Lars Masson, MD  clindamycin (CLEOCIN) 150 MG capsule Take 1 capsule (150 mg total) by mouth every 6 (six) hours. 10/01/20   Margarita Grizzle, MD  fluticasone (FLONASE) 50 MCG/ACT nasal spray USE 1 SPRAY IN EACH NOSTRIL EVERY DAY 01/11/15   [provider]  furosemide (LASIX) 40 MG tablet Take 1 tablet (40 mg total) by mouth daily. 06/24/20 06/19/21  Lars Masson, MD  hydroxypropyl methylcellulose / hypromellose (ISOPTO TEARS / GONIOVISC) 2.5 % ophthalmic solution Place 1 drop into both eyes as needed for dry eyes.    [provider]  lisinopril (ZESTRIL) 40 MG tablet Take 1 tablet (40 mg total) by mouth daily. 06/24/20 06/19/21  Lars Masson, MD  loteprednol (LOTEMAX) 0.5 % ophthalmic suspension  08/15/19   [provider]  metFORMIN (GLUCOPHAGE) 500 MG tablet Take 1 tablet (500 mg total) by mouth 2 (two) times daily. 10/01/17   Leone Brand, NP  Multiple Vitamin (MULTIVITAMIN WITH MINERALS) TABS tablet Take 1 tablet by mouth daily.     [provider]  nitroGLYCERIN (NITROSTAT) 0.4 MG SL tablet Place 1 tablet (0.4 mg total) under the tongue every 5 (five) minutes x 3 doses as needed for chest pain. 09/29/17   Leone Brand, NP  omega-3 acid ethyl esters (LOVAZA) 1 g capsule TAKE ONE CAPSULE BY MOUTH TWICE DAILY 07/19/20   Leone Brand, NP  pantoprazole (PROTONIX) 40 MG tablet Take 1 tablet (40 mg total) by mouth daily. 10/30/20   Meriam Sprague, MD  pregabalin (LYRICA) 75 MG capsule Take 1 capsule (75 mg total) by mouth 2 (two) times daily. 01/15/21   Meriam Sprague,  MD  rosuvastatin (CRESTOR) 10 MG tablet TAKE ONE TABLET BY MOUTH ONCE DAILY 07/19/20   Lars Masson, MD    Allergies    Prednisone, Atorvastatin, and Pravastatin  Review of Systems   Review of Systems  Constitutional:  Negative for chills and fever.  Respiratory:  Negative for cough and shortness of breath.   Cardiovascular:  Negative for chest pain and palpitations.  Gastrointestinal:  Negative for abdominal pain and vomiting.  Musculoskeletal:  Positive for arthralgias and myalgias.  Skin:  Positive for color change and rash.  Neurological:  Negative for syncope and headaches.  All other systems reviewed and are negative.  Physical Exam Updated Vital Signs BP (!) 124/53   Pulse 63   Temp 98.6 F (37 C) (Oral)   Resp (!) 21   Ht 5\' 11"  (1.803 m)   Wt 115.7 kg   SpO2 97%   BMI 35.57 kg/m   Physical Exam Constitutional:      General: He is not in acute distress. HENT:     Head: Normocephalic and atraumatic.  Eyes:     Conjunctiva/sclera: Conjunctivae normal.     Pupils: Pupils are equal, round, and reactive to light.  Cardiovascular:     Rate and Rhythm: Normal rate and regular rhythm.  Pulmonary:     Effort: Pulmonary effort is normal. No respiratory distress.  Abdominal:     General: There is no distension.     Tenderness: There is no abdominal tenderness.  Musculoskeletal:        General: No swelling or  tenderness.     Comments: Left knee with minor effusion, full active and passive ROM with pain Right hip with normal ROM without pain  Skin:    General: Skin is warm and dry.     Capillary Refill: Capillary refill takes less than 2 seconds.     Comments: Nontender rash of left lower leg, warm to touch  Neurological:     General: No focal deficit present.     Mental Status: He is alert and oriented to person, place, and time. Mental status is at baseline.  Psychiatric:        Mood and Affect: Mood normal.        Behavior: Behavior normal.        ED Results / Procedures / Treatments   Labs (all labs ordered are listed, but only abnormal results are displayed) Labs Reviewed  CBC WITH DIFFERENTIAL/PLATELET - Abnormal; Notable for the following components:      Result Value   WBC 11.5 (*)    Neutro Abs 7.9 (*)    Monocytes Absolute 1.3 (*)    Abs Immature Granulocytes 0.18 (*)    All other components within normal limits  BASIC METABOLIC PANEL - Abnormal; Notable for the following components:   Glucose, Bld 112 (*)    BUN 27 (*)    All other components within normal limits    EKG None  Radiology Venous Img Lower Bilateral  Result Date: 02/16/2021 CLINICAL DATA:  Left calf redness and swelling, right upper leg pain and swelling as well EXAM: BILATERAL LOWER EXTREMITY VENOUS DOPPLER ULTRASOUND TECHNIQUE: Gray-scale sonography with graded compression, as well as color Doppler and duplex ultrasound were performed to evaluate the lower extremity deep venous systems from the level of the common femoral vein and including the common femoral, femoral, profunda femoral, popliteal and calf veins including the posterior tibial, peroneal and gastrocnemius veins when visible. The superficial great saphenous  vein was also interrogated. Spectral Doppler was utilized to evaluate flow at rest and with distal augmentation maneuvers in the common femoral, femoral and popliteal veins.  COMPARISON:  None. FINDINGS: RIGHT LOWER EXTREMITY Common Femoral Vein: No evidence of thrombus. Normal compressibility, respiratory phasicity and response to augmentation. Saphenofemoral Junction: No evidence of thrombus. Normal compressibility and flow on color Doppler imaging. Profunda Femoral Vein: No evidence of thrombus. Normal compressibility and flow on color Doppler imaging. Femoral Vein: No evidence of thrombus. Normal compressibility, respiratory phasicity and response to augmentation. Popliteal Vein: No evidence of thrombus. Normal compressibility, respiratory phasicity and response to augmentation. Calf Veins: No evidence of thrombus. Normal compressibility and flow on color Doppler imaging. Superficial Great Saphenous Vein: No evidence of thrombus. Normal compressibility. Other Findings:  Left anterior knee joint effusion noted. LEFT LOWER EXTREMITY Common Femoral Vein: No evidence of thrombus. Normal compressibility, respiratory phasicity and response to augmentation. Saphenofemoral Junction: No evidence of thrombus. Normal compressibility and flow on color Doppler imaging. Profunda Femoral Vein: No evidence of thrombus. Normal compressibility and flow on color Doppler imaging. Femoral Vein: No evidence of thrombus. Normal compressibility, respiratory phasicity and response to augmentation. Popliteal Vein: No evidence of thrombus. Normal compressibility, respiratory phasicity and response to augmentation. Calf Veins: No evidence of thrombus. Normal compressibility and flow on color Doppler imaging. Superficial Great Saphenous Vein: No evidence of thrombus. Normal compressibility. Other Findings:  Peripheral edema noted. IMPRESSION: No significant DVT in either extremity. Left knee joint effusion. Electronically Signed   By: Judie Petit.  Shick M.D.   On: 02/16/2021 12:39    Procedures Procedures   Medications Ordered in ED Medications  doxycycline (VIBRA-TABS) tablet 100 mg (100 mg Oral Given 02/16/21  1147)    ED Course  I have reviewed the triage vital signs and the nursing notes.  Pertinent labs & imaging results that were available during my care of the patient were reviewed by me and considered in my medical decision making (see chart for details).  Patient is here with nontender, warm, erythematous rash in the left lower extremity.  Differential diagnosis includes cellulitis versus drug rash versus DVT vs other.  I do not see petechiae to suspect a vasculitis.  This is isolated on one lower leg.  We will order a DVT ultrasound, of the bilateral legs, given his report of cramping in the right upper leg as well.  I have also ordered some doxycycline and oral antibiotics.  I think it makes sense empirically to treat him for cellulitis at this time and also to discontinue his Voltaren and Zanaflex, both of which are new medications may be causing a drug rash.  We can switch him to Flexeril instead.  He has adverse reactions to any other types of steroids including prednisone, and I would avoid these at this time.  Supplemental history is provided by the patient's daughter at bedside.  Both of them verbalized agreement and understanding with the plan.  I reviewed the patient's DVT ultrasound which showed no acute thrombosis.  I also reviewed his labs showed a very mild leukocytosis, otherwise unremarkable.  I think is reasonably safe for discharge on doxycycline for possible cellulitis.  I was able to answer all the questions and both verbalized understanding with the plan.         Final Clinical Impression(s) / ED Diagnoses Final diagnoses:  Redness and swelling of lower leg    Rx / DC Orders ED Discharge Orders  Ordered    doxycycline (VIBRAMYCIN) 100 MG capsule  2 times daily        02/16/21 1305    cyclobenzaprine (FLEXERIL) 10 MG tablet  2 times daily PRN        02/16/21 1305             Terald Sleeper, MD 02/16/21 1503

## 2021-02-17 ENCOUNTER — Telehealth: Payer: Self-pay | Admitting: Cardiology

## 2021-02-17 DIAGNOSIS — M545 Low back pain, unspecified: Secondary | ICD-10-CM | POA: Diagnosis not present

## 2021-02-17 NOTE — Telephone Encounter (Signed)
*  STAT* If patient is at the pharmacy, call can be transferred to refill team.   1. Which medications need to be refilled? (please list name of each medication and dose if known) pregabalin (LYRICA) 75 MG capsule  2. Which pharmacy/location (including street and city if local pharmacy) is medication to be sent to? [facility] - [place], [place] - [place] [id number]  3. Do they need a 30 day or 90 day supply? 90

## 2021-02-18 NOTE — Telephone Encounter (Signed)
Returned the call to the pt, and he has been made aware that he will need to call his PCP regarding refill for Lyrica.  Pt thanked me for letting him know.

## 2021-02-18 NOTE — Telephone Encounter (Signed)
Per Dr. Devin Going last note in this script, further refills must come from PCP. Thanks!

## 2021-02-19 DIAGNOSIS — M545 Low back pain, unspecified: Secondary | ICD-10-CM | POA: Diagnosis not present

## 2021-02-21 DIAGNOSIS — M25562 Pain in left knee: Secondary | ICD-10-CM | POA: Diagnosis not present

## 2021-02-21 DIAGNOSIS — M545 Low back pain, unspecified: Secondary | ICD-10-CM | POA: Diagnosis not present

## 2021-02-24 DIAGNOSIS — M545 Low back pain, unspecified: Secondary | ICD-10-CM | POA: Diagnosis not present

## 2021-02-25 DIAGNOSIS — M25562 Pain in left knee: Secondary | ICD-10-CM | POA: Diagnosis not present

## 2021-02-26 ENCOUNTER — Telehealth: Payer: Self-pay | Admitting: *Deleted

## 2021-02-26 DIAGNOSIS — M545 Low back pain, unspecified: Secondary | ICD-10-CM | POA: Diagnosis not present

## 2021-02-26 NOTE — Telephone Encounter (Signed)
   Pre-operative Risk Assessment    Patient Name: Alex Jackson  DOB: 25-Nov-1945 MRN: [id number]      Request for Surgical Clearance   Procedure:   LEFT KNEE SCOPE MEDIAL AND LATERAL MENISECTOMIES-BUCKET HANDLE TEAR  Date of Surgery: Clearance 03/05/21  PER SURGEON URGENT                              Surgeon:  DR. DANIEL CAFFREY Surgeon's Group or Practice Name:  [facility] Phone number:  [phone] Fax number:  [phone] ATTN: KELLY EXT 3134   Type of Clearance Requested: - Medical  - Pharmacy:  Hold Aspirin     Type of Anesthesia:   CHOICE   Additional requests/questions:   Elpidio Anis   02/26/2021, 2:08 PM

## 2021-02-26 NOTE — Telephone Encounter (Signed)
   Patient Name: Alex Jackson  DOB: Mar 03, 1946 MRN: [id number]  Primary Cardiologist: Tobias Alexander, MD (Inactive)  Chart reviewed as part of pre-operative protocol coverage. To summarize, based on ACC/AHA guidelines, Alex Jackson would be at acceptable risk for the planned procedure without further cardiovascular testing. Continue aspirin perioperatively per Dr. Kirke Corin, do not stop for surgery. Patient made aware. Will route this bundled recommendation to requesting provider via Epic fax function. Please call with questions.  Laurann Montana, PA-C 02/26/2021, 3:55 PM

## 2021-02-26 NOTE — Telephone Encounter (Signed)
   Name: Alex Jackson  DOB: 1945/11/03  MRN: [id number]   Primary Cardiologist: Tobias Alexander, MD (Inactive), more recently seen by Dr. Kirke Corin for [facility] evaluation  Chart reviewed as part of pre-operative protocol coverage. Patient was contacted 02/26/2021 in reference to pre-operative risk assessment for pending surgery as outlined below.  Alex Jackson was last seen on 10/2020 by Dr. Kirke Corin. Per chart, has history of CAD (NSTEMI 2016 s/p PCI to LAD with recurrent NSTEMI 09/2017 with PCI of  the distal right coronary artery with balloon angioplasty of the PDA through the stent struts). Also with hx of HTN, HLD, chronic diastolic CHF, prior LV Dysfunction that improved with last EF 2019 55-60%. Saw Dr. Kirke Corin 10/2020 for L big toe wound and concern for PAD -  but LE extremity studies showed normal ABIs with no evidence of peripheral arterial disease.  We are asked urgently to clear patient for knee surgery and hold ASA as requested. I reached out to patient for update on how he is doing. The patient affirms he has been doing well without any new cardiac symptoms. Able to exceed 4 METS without any cardiac symptoms. Therefore, based on ACC/AHA guidelines, the patient would be at acceptable risk for the planned procedure without further cardiovascular testing. The patient was advised that if he develops new symptoms prior to surgery to contact our office to arrange for a follow-up visit, and he verbalized understanding.  I reached out to Dr. Kirke Corin (patient has not yet established care with a new primary cardiologist - had recall for Dr. Shari Prows 12/2020.) Per Dr. Kirke Corin, "for patients with prior cardiac stents and surgery that is not considered high bleeding risk (like brain and spine), I do not recommend holding low-dose aspirin. Studies have shown better outcome if aspirin is continued." Since clearance was urgent, I will route to callback staff to please call orthopedic team to relay the clearance and Dr.  Jari Sportsman recommendation NOT to hold ASA. If the orthopedic team still feels they need to hold aspirin, please let preop team know as Dr. Kirke Corin would then plan to discuss directly with surgeon. Either way please route ortho response back to the preop pool - we will need to let the patient know this decision as well. I will also fax this message to requesting party.  Jewell Haught PA-C

## 2021-02-26 NOTE — Telephone Encounter (Signed)
I called Dr. Candise Bowens office and s/w Tresa Endo, surgery scheduler for Dr. Madelon Lips. I confirmed Dr. Madelon Lips ok with doing surgery whole pt is on ASA. I assured Tresa Endo that I will let the pre op provider know and we will get a clearance faxed over. Tresa Endo thanked me for the call and the help.

## 2021-02-26 NOTE — Telephone Encounter (Addendum)
Addendum to prior notes: I called pt to inform him of recommendation to continue baby aspirin. He also inquired for update on what dose he is taking of pregabalin so he can relay this to his PCP so I informed him of the last dose per rx (he states Dr. Shari Prows had wanted him to f/u with PCP for further refills which he planned to do so).

## 2021-02-28 DIAGNOSIS — M545 Low back pain, unspecified: Secondary | ICD-10-CM | POA: Diagnosis not present

## 2021-02-28 DIAGNOSIS — I10 Essential (primary) hypertension: Secondary | ICD-10-CM | POA: Diagnosis not present

## 2021-02-28 DIAGNOSIS — R972 Elevated prostate specific antigen [PSA]: Secondary | ICD-10-CM | POA: Diagnosis not present

## 2021-02-28 DIAGNOSIS — E785 Hyperlipidemia, unspecified: Secondary | ICD-10-CM | POA: Diagnosis not present

## 2021-02-28 DIAGNOSIS — E1169 Type 2 diabetes mellitus with other specified complication: Secondary | ICD-10-CM | POA: Diagnosis not present

## 2021-02-28 DIAGNOSIS — I2510 Atherosclerotic heart disease of native coronary artery without angina pectoris: Secondary | ICD-10-CM | POA: Diagnosis not present

## 2021-02-28 DIAGNOSIS — Z23 Encounter for immunization: Secondary | ICD-10-CM | POA: Diagnosis not present

## 2021-02-28 DIAGNOSIS — M25569 Pain in unspecified knee: Secondary | ICD-10-CM | POA: Diagnosis not present

## 2021-03-05 DIAGNOSIS — M948X6 Other specified disorders of cartilage, lower leg: Secondary | ICD-10-CM | POA: Diagnosis not present

## 2021-03-05 DIAGNOSIS — S83232A Complex tear of medial meniscus, current injury, left knee, initial encounter: Secondary | ICD-10-CM | POA: Diagnosis not present

## 2021-03-05 DIAGNOSIS — M1712 Unilateral primary osteoarthritis, left knee: Secondary | ICD-10-CM | POA: Diagnosis not present

## 2021-03-05 DIAGNOSIS — M6752 Plica syndrome, left knee: Secondary | ICD-10-CM | POA: Diagnosis not present

## 2021-03-05 DIAGNOSIS — S83242A Other tear of medial meniscus, current injury, left knee, initial encounter: Secondary | ICD-10-CM | POA: Diagnosis not present

## 2021-03-05 DIAGNOSIS — S83252A Bucket-handle tear of lateral meniscus, current injury, left knee, initial encounter: Secondary | ICD-10-CM | POA: Diagnosis not present

## 2021-03-05 DIAGNOSIS — X58XXXA Exposure to other specified factors, initial encounter: Secondary | ICD-10-CM | POA: Diagnosis not present

## 2021-03-05 DIAGNOSIS — G8918 Other acute postprocedural pain: Secondary | ICD-10-CM | POA: Diagnosis not present

## 2021-03-05 DIAGNOSIS — S83282A Other tear of lateral meniscus, current injury, left knee, initial encounter: Secondary | ICD-10-CM | POA: Diagnosis not present

## 2021-03-05 DIAGNOSIS — Y999 Unspecified external cause status: Secondary | ICD-10-CM | POA: Diagnosis not present

## 2021-03-07 DIAGNOSIS — M545 Low back pain, unspecified: Secondary | ICD-10-CM | POA: Diagnosis not present

## 2021-03-10 DIAGNOSIS — M545 Low back pain, unspecified: Secondary | ICD-10-CM | POA: Diagnosis not present

## 2021-03-13 ENCOUNTER — Other Ambulatory Visit: Payer: Self-pay | Admitting: Sports Medicine

## 2021-03-13 ENCOUNTER — Other Ambulatory Visit: Payer: Self-pay

## 2021-03-13 DIAGNOSIS — M48061 Spinal stenosis, lumbar region without neurogenic claudication: Secondary | ICD-10-CM

## 2021-03-13 DIAGNOSIS — M545 Low back pain, unspecified: Secondary | ICD-10-CM | POA: Diagnosis not present

## 2021-03-17 ENCOUNTER — Ambulatory Visit
Admission: RE | Admit: 2021-03-17 | Discharge: 2021-03-17 | Disposition: A | Discharge status: Home / Self Care | Payer: PPO | Source: Ambulatory Visit | Attending: Sports Medicine | Admitting: Sports Medicine

## 2021-03-17 ENCOUNTER — Other Ambulatory Visit: Payer: Self-pay

## 2021-03-17 DIAGNOSIS — M47816 Spondylosis without myelopathy or radiculopathy, lumbar region: Secondary | ICD-10-CM | POA: Diagnosis not present

## 2021-03-17 DIAGNOSIS — M48061 Spinal stenosis, lumbar region without neurogenic claudication: Secondary | ICD-10-CM

## 2021-03-17 MED ORDER — METHYLPREDNISOLONE ACETATE 40 MG/ML INJ SUSP (RADIOLOG
80.0000 mg | Freq: Once | INTRAMUSCULAR | Status: AC
  Administered 2021-03-17: 80 mg via EPIDURAL

## 2021-03-17 MED ORDER — IOPAMIDOL (ISOVUE-M 200) INJECTION 41%
1.0000 mL | Freq: Once | INTRAMUSCULAR | Status: AC
  Administered 2021-03-17: 1 mL via EPIDURAL

## 2021-03-17 NOTE — Discharge Instructions (Signed)

## 2021-03-24 DIAGNOSIS — M545 Low back pain, unspecified: Secondary | ICD-10-CM | POA: Diagnosis not present

## 2021-03-26 DIAGNOSIS — M545 Low back pain, unspecified: Secondary | ICD-10-CM | POA: Diagnosis not present

## 2021-03-31 DIAGNOSIS — M545 Low back pain, unspecified: Secondary | ICD-10-CM | POA: Diagnosis not present

## 2021-04-01 ENCOUNTER — Other Ambulatory Visit: Payer: Self-pay | Admitting: Sports Medicine

## 2021-04-01 DIAGNOSIS — M48061 Spinal stenosis, lumbar region without neurogenic claudication: Secondary | ICD-10-CM

## 2021-04-03 DIAGNOSIS — M545 Low back pain, unspecified: Secondary | ICD-10-CM | POA: Diagnosis not present

## 2021-04-07 DIAGNOSIS — M545 Low back pain, unspecified: Secondary | ICD-10-CM | POA: Diagnosis not present

## 2021-04-07 DIAGNOSIS — E11621 Type 2 diabetes mellitus with foot ulcer: Secondary | ICD-10-CM | POA: Diagnosis not present

## 2021-04-09 ENCOUNTER — Other Ambulatory Visit: Payer: Self-pay

## 2021-04-09 MED ORDER — PANTOPRAZOLE SODIUM 40 MG PO TBEC: 40.0000 mg | DELAYED_RELEASE_TABLET | Freq: Every day | ORAL | 0 refills | Status: DC

## 2021-04-10 ENCOUNTER — Ambulatory Visit
Admission: RE | Admit: 2021-04-10 | Discharge: 2021-04-10 | Disposition: A | Discharge status: Home / Self Care | Payer: PPO | Source: Ambulatory Visit | Attending: Sports Medicine | Admitting: Sports Medicine

## 2021-04-10 ENCOUNTER — Other Ambulatory Visit: Payer: Self-pay

## 2021-04-10 DIAGNOSIS — M47816 Spondylosis without myelopathy or radiculopathy, lumbar region: Secondary | ICD-10-CM | POA: Diagnosis not present

## 2021-04-10 DIAGNOSIS — M48061 Spinal stenosis, lumbar region without neurogenic claudication: Secondary | ICD-10-CM

## 2021-04-10 MED ORDER — METHYLPREDNISOLONE ACETATE 40 MG/ML INJ SUSP (RADIOLOG
80.0000 mg | Freq: Once | INTRAMUSCULAR | Status: AC
  Administered 2021-04-10: 80 mg via EPIDURAL

## 2021-04-10 MED ORDER — IOPAMIDOL (ISOVUE-M 200) INJECTION 41%
1.0000 mL | Freq: Once | INTRAMUSCULAR | Status: AC
  Administered 2021-04-10: 1 mL via EPIDURAL

## 2021-04-10 NOTE — Discharge Instructions (Signed)
Post Procedure Spinal Discharge Instruction Sheet ° °You may resume a regular diet and any medications that you routinely take (including pain medications) unless otherwise noted by MD. ° °No driving day of procedure. ° °Light activity throughout the rest of the day.  Do not do any strenuous work, exercise, bending or lifting.  The day following the procedure, you can resume normal physical activity but you should refrain from exercising or physical therapy for at least three days thereafter. ° °You may apply ice to the injection site, 20 minutes on, 20 minutes off, as needed. Do not apply ice directly to skin.  ° ° °Common Side Effects: ° °Headaches- take your usual medications as directed by your physician.  Increase your fluid intake.  Caffeinated beverages may be helpful.  Lie flat in bed until your headache resolves. ° °Restlessness or inability to sleep- you may have trouble sleeping for the next few days.  Ask your referring physician if you need any medication for sleep. ° °Facial flushing or redness- should subside within a few days. ° °Increased pain- a temporary increase in pain a day or two following your procedure is not unusual.  Take your pain medication as prescribed by your referring physician. ° °Leg cramps ° °Please contact our office at [phone] for the following symptoms: °Fever greater than 100 degrees. °Headaches unresolved with medication after 2-3 days. °Increased swelling, pain, or redness at injection site. ° ° °Thank you for visiting [facility] today.   ° °YOU MAY RESUME YOUR ASPIRIN ANYTIME AFTER INJECTION.  °

## 2021-04-23 DIAGNOSIS — M545 Low back pain, unspecified: Secondary | ICD-10-CM | POA: Diagnosis not present

## 2021-05-01 DIAGNOSIS — M545 Low back pain, unspecified: Secondary | ICD-10-CM | POA: Diagnosis not present

## 2021-05-06 ENCOUNTER — Other Ambulatory Visit: Payer: Self-pay | Admitting: Sports Medicine

## 2021-05-06 DIAGNOSIS — M545 Low back pain, unspecified: Secondary | ICD-10-CM

## 2021-05-08 DIAGNOSIS — R972 Elevated prostate specific antigen [PSA]: Secondary | ICD-10-CM | POA: Diagnosis not present

## 2021-05-08 DIAGNOSIS — L97509 Non-pressure chronic ulcer of other part of unspecified foot with unspecified severity: Secondary | ICD-10-CM | POA: Diagnosis not present

## 2021-05-08 DIAGNOSIS — Z7984 Long term (current) use of oral hypoglycemic drugs: Secondary | ICD-10-CM | POA: Diagnosis not present

## 2021-05-08 DIAGNOSIS — E1169 Type 2 diabetes mellitus with other specified complication: Secondary | ICD-10-CM | POA: Diagnosis not present

## 2021-05-16 ENCOUNTER — Ambulatory Visit
Admission: RE | Admit: 2021-05-16 | Discharge: 2021-05-16 | Disposition: A | Discharge status: Home / Self Care | Payer: PPO | Source: Ambulatory Visit | Attending: Sports Medicine | Admitting: Sports Medicine

## 2021-05-16 ENCOUNTER — Other Ambulatory Visit: Payer: Self-pay

## 2021-05-16 DIAGNOSIS — M48061 Spinal stenosis, lumbar region without neurogenic claudication: Secondary | ICD-10-CM | POA: Diagnosis not present

## 2021-05-16 DIAGNOSIS — M545 Low back pain, unspecified: Secondary | ICD-10-CM

## 2021-05-20 DIAGNOSIS — M5117 Intervertebral disc disorders with radiculopathy, lumbosacral region: Secondary | ICD-10-CM | POA: Diagnosis not present

## 2021-05-20 DIAGNOSIS — R262 Difficulty in walking, not elsewhere classified: Secondary | ICD-10-CM | POA: Diagnosis not present

## 2021-05-20 DIAGNOSIS — M25652 Stiffness of left hip, not elsewhere classified: Secondary | ICD-10-CM | POA: Diagnosis not present

## 2021-05-20 DIAGNOSIS — M25651 Stiffness of right hip, not elsewhere classified: Secondary | ICD-10-CM | POA: Diagnosis not present

## 2021-05-26 ENCOUNTER — Encounter (HOSPITAL_BASED_OUTPATIENT_CLINIC_OR_DEPARTMENT_OTHER): Payer: PPO | Attending: Internal Medicine | Admitting: Internal Medicine

## 2021-05-26 ENCOUNTER — Other Ambulatory Visit: Payer: Self-pay

## 2021-05-26 DIAGNOSIS — M545 Low back pain, unspecified: Secondary | ICD-10-CM | POA: Insufficient documentation

## 2021-05-26 DIAGNOSIS — L97522 Non-pressure chronic ulcer of other part of left foot with fat layer exposed: Secondary | ICD-10-CM | POA: Diagnosis not present

## 2021-05-26 DIAGNOSIS — E11621 Type 2 diabetes mellitus with foot ulcer: Secondary | ICD-10-CM | POA: Insufficient documentation

## 2021-05-26 DIAGNOSIS — Z955 Presence of coronary angioplasty implant and graft: Secondary | ICD-10-CM | POA: Diagnosis not present

## 2021-05-26 DIAGNOSIS — E782 Mixed hyperlipidemia: Secondary | ICD-10-CM | POA: Insufficient documentation

## 2021-05-26 DIAGNOSIS — U071 COVID-19: Secondary | ICD-10-CM | POA: Diagnosis not present

## 2021-05-26 DIAGNOSIS — J069 Acute upper respiratory infection, unspecified: Secondary | ICD-10-CM | POA: Diagnosis not present

## 2021-05-26 NOTE — Progress Notes (Signed)
ROBIE, MCNIEL ([id number]) Visit Report for 05/26/2021 Abuse Risk Screen Details Patient Name: Date of Service: Alex Jackson. 05/26/2021 9:30 A M Medical Record Number: [id number] Patient Account Number: [id number] Date of Birth/Sex: Treating RN: 03-24-1946 (76 y.o. Tammy Sours Primary Care Cyndee Giammarco: [name] Other Clinician: Referring Ishika Chesterfield: Treating Theola Cuellar/Extender: Zena Amos in Treatment: 0 Abuse Risk Screen Items Answer ABUSE RISK SCREEN: Has anyone close to you tried to hurt or harm you recentlyo No Do you feel uncomfortable with anyone in your familyo No Has anyone forced you do things that you didnt want to doo No Electronic Signature(s) Signed: 05/26/2021 5:00:04 PM By: Shawn Stall RN, BSN Entered By: Shawn Stall on 05/26/2021 09:40:50 -------------------------------------------------------------------------------- Activities of Daily Living Details Patient Name: Date of Service: Alex Jackson. 05/26/2021 9:30 A M Medical Record Number: [id number] Patient Account Number: [id number] Date of Birth/Sex: Treating RN: 1945/06/21 (76 y.o. Tammy Sours Primary Care Maryse Brierley: [name] Other Clinician: Referring Chaselynn Kepple: Treating Alexsia Klindt/Extender: Zena Amos in Treatment: 0 Activities of Daily Living Items Answer Activities of Daily Living (Please select one for each item) Drive Automobile Completely Able T Medications ake Completely Able Use T elephone Completely Able Care for Appearance Completely Able Use T oilet Completely Able Bath / Shower Completely Able Dress Self Completely Able Feed Self Completely Able Walk Completely Able Get In / Out Bed Completely Able Housework Completely Able Prepare Meals Completely Able Handle Money Completely Able Shop for Self Completely Able Electronic Signature(s) Signed: 05/26/2021 5:00:04 PM By: Shawn Stall RN, BSN Entered By:  Shawn Stall on 05/26/2021 09:41:05 -------------------------------------------------------------------------------- Education Screening Details Patient Name: Date of Service: Alex Jackson, Alex BERT W. 05/26/2021 9:30 A M Medical Record Number: [id number] Patient Account Number: [id number] Date of Birth/Sex: Treating RN: Nov 21, 1945 (76 y.o. Tammy Sours Primary Care Zeki Bedrosian: [name] Other Clinician: Referring Leary Mcnulty: Treating Esmirna Ravan/Extender: Zena Amos in Treatment: 0 Primary Learner Assessed: Patient Learning Preferences/Education Level/Primary Language Learning Preference: Explanation, Demonstration, Printed Material Highest Education Level: High School Preferred Language: English Cognitive Barrier Language Barrier: No Translator Needed: No Memory Deficit: No Emotional Barrier: No Cultural/Religious Beliefs Affecting Medical Care: No Physical Barrier Impaired Vision: Yes Glasses Impaired Hearing: No Decreased Hand dexterity: No Knowledge/Comprehension Knowledge Level: High Comprehension Level: High Ability to understand written instructions: High Ability to understand verbal instructions: High Motivation Anxiety Level: Calm Cooperation: Cooperative Education Importance: Acknowledges Need Interest in Health Problems: Asks Questions Perception: Coherent Willingness to Engage in Self-Management High Activities: Readiness to Engage in Self-Management High Activities: Electronic Signature(s) Signed: 05/26/2021 5:00:04 PM By: Shawn Stall RN, BSN Entered By: Shawn Stall on 05/26/2021 09:41:20 -------------------------------------------------------------------------------- Fall Risk Assessment Details Patient Name: Date of Service: Alex Jackson, Alex BERT W. 05/26/2021 9:30 A M Medical Record Number: [id number] Patient Account Number: [id number] Date of Birth/Sex: Treating RN: 12-08-1945 (76 y.o. Harlon Flor, Yvonne Kendall Primary Care  Harman Langhans: [name] Other Clinician: Referring Mareli Antunes: Treating Perrin Gens/Extender: Zena Amos in Treatment: 0 Fall Risk Assessment Items Have you had 2 or more falls in the last 12 monthso 0 No Have you had any fall that resulted in injury in the last 12 monthso 0 No FALLS RISK SCREEN History of falling - immediate or within 3 months 0 No Secondary diagnosis (Do you have 2 or more medical diagnoseso) 0 No Ambulatory aid None/bed rest/wheelchair/nurse 0 No Crutches/cane/walker 15 Yes Furniture 0 No Intravenous therapy Access/Saline/Heparin [employer]  0 No Gait/Transferring Normal/ bed rest/ wheelchair 0 Yes Weak (short steps with or without shuffle, stooped but able to lift head while walking, may seek 0 No support from furniture) Impaired (short steps with shuffle, may have difficulty arising from chair, head down, impaired 0 No balance) Mental Status Oriented to own ability 0 Yes Electronic Signature(s) Signed: 05/26/2021 5:00:04 PM By: Shawn Stall RN, BSN Entered By: Shawn Stall on 05/26/2021 09:41:31 -------------------------------------------------------------------------------- Foot Assessment Details Patient Name: Date of Service: Alex Jackson, Alex BERT W. 05/26/2021 9:30 A M Medical Record Number: [id number] Patient Account Number: [id number] Date of Birth/Sex: Treating RN: 1945-12-31 (76 y.o. Tammy Sours Primary Care Estalene Bergey: [name] Other Clinician: Referring Kyion Gautier: Treating Kamron Portee/Extender: Zena Amos in Treatment: 0 Foot Assessment Items Site Locations + = Sensation present, - = Sensation absent, C = Callus, U = Ulcer R = Redness, W = Warmth, M = Maceration, PU = Pre-ulcerative lesion F = Fissure, S = Swelling, D = Dryness Assessment Right: Left: Other Deformity: No No Prior Foot Ulcer: No No Prior Amputation: No No Charcot Joint: No No Ambulatory Status: Ambulatory With Help Assistance  Device: Walker [employer] trader) Signed: 05/26/2021 5:00:04 PM By: Shawn Stall RN, BSN Entered By: Shawn Stall on 05/26/2021 10:00:41 -------------------------------------------------------------------------------- Nutrition Risk Screening Details Patient Name: Date of Service: Alex Carp W. 05/26/2021 9:30 A M Medical Record Number: [id number] Patient Account Number: [id number] Date of Birth/Sex: Treating RN: 09/10/1945 (76 y.o. Harlon Flor, Millard.Loa Primary Care Jamyla Ard: [name] Other Clinician: Referring Annalis Kaczmarczyk: Treating Jasani Lengel/Extender: Zena Amos in Treatment: 0 Height (in): 71 Weight (lbs): 252 Body Mass Index (BMI): 35.1 Nutrition Risk Screening Items Score Screening NUTRITION RISK SCREEN: I have an illness or condition that made me change the kind and/or amount of food I eat 2 Yes I eat fewer than two meals per day 0 No I eat few fruits and vegetables, or milk products 0 No I have three or more drinks of beer, liquor or wine almost every day 0 No I have tooth or mouth problems that make it hard for me to eat 0 No I don't always have enough money to buy the food I need 0 No I eat alone most of the time 0 No I take three or more different prescribed or over-the-counter drugs a day 1 Yes Without wanting to, I have lost or gained 10 pounds in the last six months 0 No I am not always physically able to shop, cook and/or feed myself 0 No Nutrition Protocols Good Risk Protocol Provide education on elevated blood Moderate Risk Protocol 0 sugars and impact on wound healing, as applicable High Risk Proctocol Risk Level: Moderate Risk Score: 3 Electronic Signature(s) Signed: 05/26/2021 5:00:04 PM By: Shawn Stall RN, BSN Entered By: Shawn Stall on 05/26/2021 09:41:41

## 2021-05-26 NOTE — Progress Notes (Signed)
Alex Jackson ([id number]) Visit Report for 05/26/2021 Chief Complaint Document Details Patient Name: Date of Service: Alex Jackson. 05/26/2021 9:30 A M Medical Record Number: [id number] Patient Account Number: [id number] Date of Birth/Sex: Treating RN: [birth year] (76 y.o. M) Primary Care Provider: London Pepper Other Clinician: Referring Provider: Treating Provider/Extender: Merilyn Baba in Treatment: 0 Information Obtained from: Patient Chief Complaint Left great toe wound Electronic Signature(s) Signed: 05/26/2021 12:23:03 PM By: Kalman Shan DO Entered By: Kalman Shan on 05/26/2021 12:12:49 -------------------------------------------------------------------------------- Debridement Details Patient Name: Date of Service: Alex Jackson, Alex BERT W. 05/26/2021 9:30 A M Medical Record Number: [id number] Patient Account Number: [id number] Date of Birth/Sex: Treating RN: March 21, 1946 (76 y.o. Alex Jackson, Alex Jackson Primary Care Provider: London Pepper Other Clinician: Referring Provider: Treating Provider/Extender: Merilyn Baba in Treatment: 0 Debridement Performed for Assessment: Wound #1 Left T Great oe Performed By: Physician Kalman Shan, DO Debridement Type: Debridement Severity of Tissue Pre Debridement: Fat layer exposed Level of Consciousness (Pre-procedure): Awake and Alert Pre-procedure Verification/Time Out Yes - 10:50 Taken: Start Time: 10:51 Pain Control: Lidocaine 5% topical ointment T Area Debrided (L x W): otal 1 (cm) x 2 (cm) = 2 (cm) Tissue and other material debrided: Viable, Non-Viable, Callus, Slough, Subcutaneous, Skin: Dermis , Skin: Epidermis, Fibrin/Exudate, Slough Level: Skin/Subcutaneous Tissue Debridement Description: Excisional Instrument: Curette Bleeding: Minimum Hemostasis Achieved: Pressure End Time: 10:57 Procedural Pain: 0 Post Procedural Pain: 0 Response to Treatment:  Procedure was tolerated well Level of Consciousness (Post- Awake and Alert procedure): Post Debridement Measurements of Total Wound Length: (cm) 0.6 Width: (cm) 0.6 Depth: (cm) 0.4 Volume: (cm) 0.113 Character of Wound/Ulcer Post Debridement: Improved Severity of Tissue Post Debridement: Fat layer exposed Post Procedure Diagnosis Same as Pre-procedure Electronic Signature(s) Signed: 05/26/2021 12:23:03 PM By: Kalman Shan DO Signed: 05/26/2021 5:00:04 PM By: Deon Pilling RN, BSN Entered By: Deon Pilling on 05/26/2021 10:59:08 -------------------------------------------------------------------------------- HPI Details Patient Name: Date of Service: Alex Jackson, Alex BERT W. 05/26/2021 9:30 A M Medical Record Number: [id number] Patient Account Number: [id number] Date of Birth/Sex: Treating RN: May 30, 1945 (76 y.o. M) Primary Care Provider: London Pepper Other Clinician: Referring Provider: Treating Provider/Extender: Merilyn Baba in Treatment: 0 History of Present Illness HPI Description: Admission 05/26/2021 Alex Jackson is a 76 year old male with a past medical history of controlled type 2 diabetes on oral agents, chronic back pain and coronary artery disease status post stenting to the LAD that presents to the clinic for a 57-month history of nonhealing wound to the left great toe plantar aspect. He has peripheral neuropathy and is not sure how the wound developed. He has been following with Dr. Doran Durand for this issue and has been using Betadine to the area daily. He does not offload the area. He denies signs of infection. Electronic Signature(s) Signed: 05/26/2021 12:23:03 PM By: Kalman Shan DO Entered By: Kalman Shan on 05/26/2021 12:14:28 -------------------------------------------------------------------------------- Physical Exam Details Patient Name: Date of Service: Alex [place] W. 05/26/2021 9:30 A M Medical Record Number:  [id number] Patient Account Number: [id number] Date of Birth/Sex: Treating RN: Jul 06, 1945 (76 y.o. M) Primary Care Provider: London Pepper Other Clinician: Referring Provider: Treating Provider/Extender: Merilyn Baba in Treatment: 0 Constitutional respirations regular, non-labored and within target range for patient.. Cardiovascular 2+ dorsalis pedis/posterior tibialis pulses. Psychiatric pleasant and cooperative. Notes Left great toe: T the plantar aspect there is an open wound with nonviable tissue, granulation tissue and circumferential  callus with undermining. o Electronic Signature(s) Signed: 05/26/2021 12:23:03 PM By: Kalman Shan DO Entered By: Kalman Shan on 05/26/2021 12:14:54 -------------------------------------------------------------------------------- Physician Orders Details Patient Name: Date of Service: Alex Jackson, Alex BERT W. 05/26/2021 9:30 A M Medical Record Number: [id number] Patient Account Number: [id number] Date of Birth/Sex: Treating RN: 09-Dec-1945 (76 y.o. Alex Jackson, Alex Jackson Primary Care Provider: London Pepper Other Clinician: Referring Provider: Treating Provider/Extender: Merilyn Baba in Treatment: 0 Verbal / Phone Orders: No Diagnosis Coding ICD-10 Coding Code Description [phone] Non-pressure chronic ulcer of other part of left foot with fat layer exposed E11.621 Type 2 diabetes mellitus with foot ulcer Z95.5 Presence of coronary angioplasty implant and graft E78.5 Hyperlipidemia, unspecified M54.50 Low back pain, unspecified Follow-up Appointments ppointment in 1 week. - Dr. Heber [place] Return A Other: - [employer]- wound care supplier Bathing/ Shower/ Hygiene May shower and wash wound with soap and water. - with dressing changes. Edema Control - Lymphedema / SCD / Other Elevate legs to the level of the heart or above for 30 minutes daily and/or when sitting, a frequency of: - 3-4  times a day throughout the day. Avoid standing for long periods of time. Moisturize legs daily. - every night before bed. Off-Loading Wedge shoe to: - use while walking and standing. No bare feet. Wound Treatment Wound #1 - T Great oe Wound Laterality: Left Cleanser: Soap and Water 1 x Per Day/30 Days Discharge Instructions: May shower and wash wound with dial antibacterial soap and water prior to dressing change. Prim Dressing: Hydrofera Blue Ready Foam, 2.5 x2.5 in 1 x Per Day/30 Days ary Discharge Instructions: Apply to wound bed as instructed Secondary Dressing: Woven Gauze Sponges 2x2 in 1 x Per Day/30 Days Discharge Instructions: Apply over primary dressing and under the toe rolled gauze as directed. Secured With: Child psychotherapist, Sterile 2x75 (in/in) 1 x Per Day/30 Days Discharge Instructions: Secure with stretch gauze as directed. Secured With: 35M Medipore H Soft Cloth Surgical T ape, 4 x 10 (in/yd) 1 x Per Day/30 Days Discharge Instructions: Secure with tape as directed. Laboratory erobe culture (MICRO) - culture of left great toe. - (ICD10 E11.621 - Type 2 diabetes mellitus Bacteria identified in Unspecified specimen by A with foot ulcer) LOINC Code: [id number] Convenience Name: Areobic culture-specimen not specified Radiology X-ray, toe left great - x-ray of left great toe related to nonhealing diabetic foot ulcer looking for infection. CPT code - (ICD10 E11.621 - Type 2 diabetes mellitus with foot ulcer) Patient Medications llergies: prednisone A Notifications Medication Indication Start End lidocaine DOSE topical 5 % ointment - ointment topical applied only in clinic. Electronic Signature(s) Signed: 05/26/2021 12:23:03 PM By: Kalman Shan DO Entered By: Kalman Shan on 05/26/2021 12:17:55 Prescription 05/26/2021 -------------------------------------------------------------------------------- Vinetta Bergamo. Kalman Shan DO Patient  Name: Provider: [birth year] [id number] Date of Birth: NPI#: Jerilynn Mages [id number] Sex: DEA #: [phone] [id number] Phone #: License #: [address] Patient Address: [address] [place], [place] [address] [place], [place] [address] [phone] Allergies prednisone Provider's Orders X-ray, toe left great - ICD10: E11.621 - x-ray of left great toe related to nonhealing diabetic foot ulcer looking for infection. CPT code Hand Signature: Date(s): Prescription 05/26/2021 MIKAH, FRONHEISER. Kalman Shan DO Patient Name: Provider: 1946-04-09 [id number] Date of Birth: NPI#: Jerilynn Mages [id number] Sex: DEA #: [phone] Phone #: License #: [place] Patient Address: [address]  [place], [place] [address] [place], [place] [address] [phone] Allergies prednisone Medication Medication: Route: Strength: Form: lidocaine 5 % topical ointment topical 5% ointment Class: TOPICAL LOCAL ANESTHETICS Dose: Frequency / Time: Indication: ointment topical applied only in clinic. Number of Refills: Number of Units: 0 Generic Substitution: Start Date: End Date: One Time Use: Substitution Permitted No Note to Pharmacy: Hand Signature: Date(s): Electronic Signature(s) Signed: 05/26/2021 12:23:03 PM By: Kalman Shan DO Entered By: Kalman Shan on 05/26/2021 12:17:55 -------------------------------------------------------------------------------- Problem List Details Patient Name: Date of Service: Alex Jackson, Alex BERT W. 05/26/2021 9:30 A M Medical Record Number: [id number] Patient Account Number: [id number] Date of Birth/Sex: Treating RN: [birth year] (76 y.o. [name] Primary Care Provider: London Pepper Other Clinician: Referring Provider: Treating Provider/Extender: Merilyn Baba in  Treatment: 0 Active Problems ICD-10 Encounter Code Description Active Date MDM Diagnosis [id number] Non-pressure chronic ulcer of other part of left foot with fat layer exposed 05/26/2021 No Yes E11.621 Type 2 diabetes mellitus with foot ulcer 05/26/2021 No Yes Z95.5 Presence of coronary angioplasty implant and graft 05/26/2021 No Yes E78.5 Hyperlipidemia, unspecified 05/26/2021 No Yes M54.50 Low back pain, unspecified 05/26/2021 No Yes Inactive Problems Resolved Problems Electronic Signature(s) Signed: 05/26/2021 12:23:03 PM By: Kalman Shan DO Entered By: Kalman Shan on 05/26/2021 12:12:03 -------------------------------------------------------------------------------- Progress Note Details Patient Name: Date of Service: Alex Jackson, Alex BERT W. 05/26/2021 9:30 A M Medical Record Number: [id number] Patient Account Number: [id number] Date of Birth/Sex: Treating RN: July 31, 1945 (76 y.o. M) Primary Care Provider: London Pepper Other Clinician: Referring Provider: Treating Provider/Extender: Merilyn Baba in Treatment: 0 Subjective Chief Complaint Information obtained from Patient Left great toe wound History of Present Illness (HPI) Admission 05/26/2021 Mr. Briyan Criddle is a 76 year old male with a past medical history of controlled type 2 diabetes on oral agents, chronic back pain and coronary artery disease status post stenting to the LAD that presents to the clinic for a 50-month history of nonhealing wound to the left great toe plantar aspect. He has peripheral neuropathy and is not sure how the wound developed. He has been following with Dr. Doran Durand for this issue and has been using Betadine to the area daily. He does not offload the area. He denies signs of infection. Patient History Information obtained from Patient. Allergies prednisone (Reaction: anxious-side effects) Family History Cancer - Father,Mother, Hypertension - Mother, No family history  of Diabetes, Heart Disease, Hereditary Spherocytosis, Kidney Disease, Lung Disease, Seizures, Stroke, Thyroid Problems, Tuberculosis. Social History Former smoker - quit 1985, Alcohol Use - Daily - wine or scotch, Drug Use - No History, Caffeine Use - Never. Medical History Eyes Patient has history of Cataracts - removed Denies history of Glaucoma, Optic Neuritis Ear/Nose/Mouth/Throat Denies history of Chronic sinus problems/congestion, Middle ear problems Hematologic/Lymphatic Denies history of Hemophilia, Human Immunodeficiency Virus, Lymphedema, Sickle Cell Disease Respiratory Denies history of Aspiration, Asthma, Chronic Obstructive Pulmonary Disease (COPD), Pneumothorax, Sleep Apnea, Tuberculosis Cardiovascular Patient has history of Coronary Artery Disease, Hypertension, Myocardial Infarction - NSTEMI Denies history of Angina, Arrhythmia, Congestive Heart Failure, Deep Vein Thrombosis, Hypotension, Peripheral Arterial Disease, Peripheral Venous Disease, Phlebitis, Vasculitis Gastrointestinal Denies history of Cirrhosis , Colitis, Crohnoos, Hepatitis A, Hepatitis B, Hepatitis C Endocrine Patient has history of Type II Diabetes - PCP notes type II; per patient takes metformin been told prediabetic Denies history of Type I Diabetes Genitourinary Denies history of End Stage Renal Disease Immunological Denies history of Lupus Erythematosus, Raynaudoos, Scleroderma  Integumentary (Skin) Denies history of History of Burn Musculoskeletal Denies history of Gout, Rheumatoid Arthritis, Osteoarthritis, Osteomyelitis Neurologic Denies history of Dementia, Neuropathy, Quadriplegia, Paraplegia, Seizure Disorder Oncologic Denies history of Received Chemotherapy, Received Radiation Psychiatric Denies history of Anorexia/bulimia, Confinement Anxiety Patient is treated with Controlled Diet, Oral Agents. Blood sugar is not tested. Hospitalization/Surgery History - stents MRI 2017and2019. -  torn meniscus left 5 weeks ago. Medical A Surgical History Notes nd Constitutional Symptoms (General Health) Back pain x6 months Cardiovascular hyperlipidemia Genitourinary Elevated PSA Review of Systems (ROS) Constitutional Symptoms (General Health) Denies complaints or symptoms of Fatigue, Fever, Chills, Marked Weight Change. Eyes Complains or has symptoms of Glasses / Contacts. Denies complaints or symptoms of Dry Eyes, Vision Changes. Ear/Nose/Mouth/Throat Denies complaints or symptoms of Chronic sinus problems or rhinitis. Respiratory Denies complaints or symptoms of Chronic or frequent coughs, Shortness of Breath. Cardiovascular Denies complaints or symptoms of Chest pain. Gastrointestinal Denies complaints or symptoms of Frequent diarrhea, Nausea, Vomiting. Endocrine Denies complaints or symptoms of Heat/cold intolerance. Genitourinary Denies complaints or symptoms of Frequent urination. Integumentary (Skin) Complains or has symptoms of Wounds - left great toe. Musculoskeletal Denies complaints or symptoms of Muscle Pain, Muscle Weakness. Neurologic Denies complaints or symptoms of Numbness/parasthesias. Psychiatric Denies complaints or symptoms of Claustrophobia, Suicidal. Objective Constitutional respirations regular, non-labored and within target range for patient.. Vitals Time Taken: 9:35 AM, Height: 71 in, Source: Stated, Weight: 252 lbs, Source: Stated, BMI: 35.1, Temperature: 98.4 F, Pulse: 76 bpm, Respiratory Rate: 22 breaths/min, Blood Pressure: 157/87 mmHg. Cardiovascular 2+ dorsalis pedis/posterior tibialis pulses. Psychiatric pleasant and cooperative. General Notes: Left great toe: T the plantar aspect there is an open wound with nonviable tissue, granulation tissue and circumferential callus with o undermining. Integumentary (Hair, Skin) Wound #1 status is Open. Original cause of wound was Blister. The date acquired was: 10/02/2020. The wound is  located on the Left T Great. The wound oe measures 0.6cm length x 0.6cm width x 0.4cm depth; 0.283cm^2 area and 0.113cm^3 volume. There is Fat Layer (Subcutaneous Tissue) exposed. There is no tunneling or undermining noted. There is a medium amount of serosanguineous drainage noted. The wound margin is distinct with the outline attached to the wound base. There is large (67-100%) red granulation within the wound bed. There is a small (1-33%) amount of necrotic tissue within the wound bed including Adherent Slough. General Notes: callous to periwound. Assessment Active Problems ICD-10 Non-pressure chronic ulcer of other part of left foot with fat layer exposed Type 2 diabetes mellitus with foot ulcer Presence of coronary angioplasty implant and graft Hyperlipidemia, unspecified Low back pain, unspecified Patient presents with a 63-month history of nonhealing wound to the left great toe. He has a history of diabetes with peripheral neuropathy. His ABIs were 1.04 on the left. His glucose levels are well controlled. I debrided nonviable tissue. Due to the chronicity of the wound I recommended obtaining an x-ray. I also recommended aggressive offloading to the wound bed. We tried a wedge shoe in office however this was causing discomfort and we switched to a surgical shoe with an offloading felt pad. He will do Hydrofera Blue dressing changes daily. Also obtained a wound culture to identify any bioburden as he would likely benefit from an antibiotic ointment with dressing changes. No surrounding signs of infection. Follow-up in 1 week. 48 minutes was spent on the encounter including face-to-face, EMR review and coordination of care Procedures Wound #1 Pre-procedure diagnosis of Wound #1 is a Diabetic Wound/Ulcer of the Lower  Extremity located on the Left T Great .Severity of Tissue Pre Debridement is: oe Fat layer exposed. There was a Excisional Skin/Subcutaneous Tissue Debridement with a total  area of 2 sq cm performed by Kalman Shan, DO. With the following instrument(s): Curette to remove Viable and Non-Viable tissue/material. Material removed includes Callus, Subcutaneous Tissue, Slough, Skin: Dermis, Skin: Epidermis, and Fibrin/Exudate after achieving pain control using Lidocaine 5% topical ointment. A time out was conducted at 10:50, prior to the start of the procedure. A Minimum amount of bleeding was controlled with Pressure. The procedure was tolerated well with a pain level of 0 throughout and a pain level of 0 following the procedure. Post Debridement Measurements: 0.6cm length x 0.6cm width x 0.4cm depth; 0.113cm^3 volume. Character of Wound/Ulcer Post Debridement is improved. Severity of Tissue Post Debridement is: Fat layer exposed. Post procedure Diagnosis Wound #1: Same as Pre-Procedure Plan Follow-up Appointments: Return Appointment in 1 week. - Dr. Heber [place] Other: - [employer]- wound care supplier Bathing/ Shower/ Hygiene: May shower and wash wound with soap and water. - with dressing changes. Edema Control - Lymphedema / SCD / Other: Elevate legs to the level of the heart or above for 30 minutes daily and/or when sitting, a frequency of: - 3-4 times a day throughout the day. Avoid standing for long periods of time. Moisturize legs daily. - every night before bed. Off-Loading: Wedge shoe to: - use while walking and standing. No bare feet. Radiology ordered were: X-ray, toe left great - x-ray of left great toe related to nonhealing diabetic foot ulcer looking for infection. CPT code Laboratory ordered were: Areobic culture-specimen not specified - culture of left great toe. The following medication(s) was prescribed: lidocaine topical 5 % ointment ointment topical applied only in clinic. was prescribed at facility WOUND #1: - T Great Wound Laterality: Left oe Cleanser: Soap and Water 1 x Per Day/30 Days Discharge Instructions: May shower and wash  wound with dial antibacterial soap and water prior to dressing change. Prim Dressing: Hydrofera Blue Ready Foam, 2.5 x2.5 in 1 x Per Day/30 Days ary Discharge Instructions: Apply to wound bed as instructed Secondary Dressing: Woven Gauze Sponges 2x2 in 1 x Per Day/30 Days Discharge Instructions: Apply over primary dressing and under the toe rolled gauze as directed. Secured With: Child psychotherapist, Sterile 2x75 (in/in) 1 x Per Day/30 Days Discharge Instructions: Secure with stretch gauze as directed. Secured With: 27M Medipore H Soft Cloth Surgical T ape, 4 x 10 (in/yd) 1 x Per Day/30 Days Discharge Instructions: Secure with tape as directed. 1. In office sharp debridement 2. Hydrofera Blue 3. Wound culture 4. Follow-up in 1 week Electronic Signature(s) Signed: 05/26/2021 12:23:03 PM By: Kalman Shan DO Entered By: Kalman Shan on 05/26/2021 12:22:19 -------------------------------------------------------------------------------- HxROS Details Patient Name: Date of Service: Alex Jackson, Alex BERT W. 05/26/2021 9:30 A M Medical Record Number: [id number] Patient Account Number: [id number] Date of Birth/Sex: Treating RN: 25-Mar-1946 (76 y.o. [name] Primary Care Provider: London Pepper Other Clinician: Referring Provider: Treating Provider/Extender: Merilyn Baba in Treatment: 0 Information Obtained From Patient Constitutional Symptoms ([place]) Complaints and Symptoms: Negative for: Fatigue; Fever; Chills; Marked Weight Change Medical History: Past Medical History Notes: Back pain x6 months Eyes Complaints and Symptoms: Positive for: Glasses / Contacts Negative for: Dry Eyes; Vision Changes Medical History: Positive for: Cataracts - removed Negative for: Glaucoma; Optic Neuritis Ear/Nose/Mouth/Throat Complaints and Symptoms: Negative for: Chronic sinus problems or rhinitis Medical History: Negative for:  Chronic sinus  problems/congestion; Middle ear problems Respiratory Complaints and Symptoms: Negative for: Chronic or frequent coughs; Shortness of Breath Medical History: Negative for: Aspiration; Asthma; Chronic Obstructive Pulmonary Disease (COPD); Pneumothorax; Sleep Apnea; Tuberculosis Cardiovascular Complaints and Symptoms: Negative for: Chest pain Medical History: Positive for: Coronary Artery Disease; Hypertension; Myocardial Infarction - NSTEMI Negative for: Angina; Arrhythmia; Congestive Heart Failure; Deep Vein Thrombosis; Hypotension; Peripheral Arterial Disease; Peripheral Venous Disease; Phlebitis; Vasculitis Past Medical History Notes: hyperlipidemia Gastrointestinal Complaints and Symptoms: Negative for: Frequent diarrhea; Nausea; Vomiting Medical History: Negative for: Cirrhosis ; Colitis; Crohns; Hepatitis A; Hepatitis B; Hepatitis C Endocrine Complaints and Symptoms: Negative for: Heat/cold intolerance Medical History: Positive for: Type II Diabetes - PCP notes type II; per patient takes metformin been told prediabetic Negative for: Type I Diabetes Time with diabetes: prediabetic 3-4 years per pt Treated with: Oral agents, Diet Blood sugar tested every day: No Genitourinary Complaints and Symptoms: Negative for: Frequent urination Medical History: Negative for: End Stage Renal Disease Past Medical History Notes: Elevated PSA Integumentary (Skin) Complaints and Symptoms: Positive for: Wounds - left great toe Medical History: Negative for: History of Burn Musculoskeletal Complaints and Symptoms: Negative for: Muscle Pain; Muscle Weakness Medical History: Negative for: Gout; Rheumatoid Arthritis; Osteoarthritis; Osteomyelitis Neurologic Complaints and Symptoms: Negative for: Numbness/parasthesias Medical History: Negative for: Dementia; Neuropathy; Quadriplegia; Paraplegia; Seizure Disorder Psychiatric Complaints and Symptoms: Negative for: Claustrophobia;  Suicidal Medical History: Negative for: Anorexia/bulimia; Confinement Anxiety Hematologic/Lymphatic Medical History: Negative for: Hemophilia; Human Immunodeficiency Virus; Lymphedema; Sickle Cell Disease Immunological Medical History: Negative for: Lupus Erythematosus; Raynauds; Scleroderma Oncologic Medical History: Negative for: Received Chemotherapy; Received Radiation HBO Extended History Items Eyes: Cataracts Immunizations Pneumococcal Vaccine: Received Pneumococcal Vaccination: Yes Received Pneumococcal Vaccination On or After 60th Birthday: Yes Implantable Devices No devices added Hospitalization / Surgery History Type of Hospitalization/Surgery stents MRI 2017and2019 torn meniscus left 5 weeks ago Family and Social History Cancer: Yes - Father,Mother; Diabetes: No; Heart Disease: No; Hereditary Spherocytosis: No; Hypertension: Yes - Mother; Kidney Disease: No; Lung Disease: No; Seizures: No; Stroke: No; Thyroid Problems: No; Tuberculosis: No; Former smoker - quit 1985; Alcohol Use: Daily - wine or scotch; Drug Use: No History; Caffeine Use: Never; Financial Concerns: No; Food, Clothing or Shelter Needs: No; Support System Lacking: No; Transportation Concerns: No Electronic Signature(s) Signed: 05/26/2021 12:23:03 PM By: Kalman Shan DO Signed: 05/26/2021 5:00:04 PM By: Deon Pilling RN, BSN Entered By: Deon Pilling on 05/26/2021 09:59:39 -------------------------------------------------------------------------------- SuperBill Details Patient Name: Date of Service: Alex Jackson. 05/26/2021 Medical Record Number: [id number] Patient Account Number: [id number] Date of Birth/Sex: Treating RN: 1945-08-29 (76 y.o. 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 E78.5 Hyperlipidemia, unspecified M54.50 Low back pain, unspecified Facility Procedures CPT4 Code: [id number] Description: 99213 - WOUND CARE VISIT-LEV 3 EST PT Modifier: Quantity: 1 CPT4 Code: [id number] Description: 11042 - DEB SUBQ TISSUE 20 SQ CM/< ICD-10 Diagnosis Description L97.522 Non-pressure chronic ulcer of other part of left foot with fat layer exposed E11.621 Type 2 diabetes mellitus with foot ulcer Modifier: Quantity: 1 Physician Procedures : CPT4 Code Description Modifier [id number] - WC PHYS LEVEL 4 - NEW PT ICD-10 Diagnosis Description L97.522 Non-pressure chronic ulcer of other part of  left foot with fat layer exposed E11.621 Type 2 diabetes mellitus with foot ulcer Z95.5 Presence of  coronary angioplasty implant and graft M54.50 Low back pain, unspecified Quantity: 1 : [id number] 11042 - WC PHYS SUBQ TISS 20 SQ CM ICD-10 Diagnosis Description L97.522 Non-pressure chronic ulcer of other part of left foot with fat layer exposed E11.621 Type 2 diabetes mellitus with foot ulcer Quantity: 1 Electronic Signature(s) Signed: 05/26/2021 12:23:03 PM By: Kalman Shan DO Entered By: Kalman Shan on 05/26/2021 12:22:49

## 2021-05-26 NOTE — Progress Notes (Signed)
Alex Jackson ([id number]) Visit Report for 05/26/2021 Allergy List Details Patient Name: Date of Service: Alex Jackson. 05/26/2021 9:30 A M Medical Record Number: [id number] Patient Account Number: [id number] Date of Birth/Sex: Treating RN: Mar 02, 1946 (76 y.o. Alex Jackson Primary Care Alex Jackson: Alex Jackson Other Clinician: Referring Alex Jackson: Treating Alex Jackson/Extender: Alex Jackson in Treatment: 0 Allergies Active Allergies prednisone Reaction: anxious-side effects Allergy Notes Electronic Signature(s) Signed: 05/26/2021 5:00:04 PM By: Alex Pilling RN, BSN Entered By: Alex Jackson on 05/26/2021 09:46:51 -------------------------------------------------------------------------------- Arrival Information Details Patient Name: Date of Service: Alex Jackson, Alex Alex W. 05/26/2021 9:30 A M Medical Record Number: [id number] Patient Account Number: [id number] Date of Birth/Sex: Treating RN: 1945/12/21 (76 y.o. Alex Jackson Primary Care Alex Jackson: Alex Jackson Other Clinician: Referring Alex Jackson: Treating Alex Jackson/Extender: Alex Jackson in Treatment: 0 Visit Information Patient Arrived: Alex Jackson Time: 09:30 Accompanied By: self Transfer Assistance: None Patient Identification Verified: Yes Secondary Verification Process Completed: Yes Patient Requires Transmission-Based Precautions: No Patient Has Alerts: Yes Patient Alerts: 11/18/20 ABI L1.04 11/18/20 TBI L 1.35 Electronic Signature(s) Signed: 05/26/2021 5:00:04 PM By: Alex Pilling RN, BSN Entered By: Alex Jackson on 05/26/2021 09:35:11 -------------------------------------------------------------------------------- Clinic Level of Care Assessment Details Patient Name: Date of Service: Alex Jackson, [place] W. 05/26/2021 9:30 A M Medical Record Number: [id number] Patient Account Number: [id number] Date of Birth/Sex: Treating RN: 09-25-1945 (76 y.o. Alex Jackson Primary Care Samaira Holzworth: Alex Jackson Other Clinician: Referring Danay Mckellar: Treating Alex Jackson/Extender: Alex Jackson in Treatment: 0 Clinic Level of Care Assessment Items TOOL 1 Quantity Score X- 1 0 Use when EandM and Procedure is performed on INITIAL visit ASSESSMENTS - Nursing Assessment / Reassessment X- 1 20 General Physical Exam (combine w/ comprehensive assessment (listed just below) when performed on new pt. evals) X- 1 25 Comprehensive Assessment (HX, ROS, Risk Assessments, Wounds Hx, etc.) ASSESSMENTS - Wound and Skin Assessment / Reassessment X- 1 10 Dermatologic / Skin Assessment (not related to wound area) ASSESSMENTS - Ostomy and/or Continence Assessment and Care '[]'  - 0 Incontinence Assessment and Management '[]'  - 0 Ostomy Care Assessment and Management (repouching, etc.) PROCESS - Coordination of Care '[]'  - 0 Simple Patient / Family Education for ongoing care X- 1 20 Complex (extensive) Patient / Family Education for ongoing care X- 1 10 Staff obtains Programmer, systems, Records, T Results / Process Orders est '[]'  - 0 Staff telephones HHA, Nursing Homes / Clarify orders / etc '[]'  - 0 Routine Transfer to another Facility (non-emergent condition) '[]'  - 0 Routine Hospital Admission (non-emergent condition) X- 1 15 New Admissions / Biomedical engineer / Ordering NPWT Apligraf, etc. , '[]'  - 0 Emergency Hospital Admission (emergent condition) PROCESS - Special Needs '[]'  - 0 Pediatric / Minor Patient Management '[]'  - 0 Isolation Patient Management '[]'  - 0 Hearing / Language / Visual special needs '[]'  - 0 Assessment of Community assistance (transportation, D/C planning, etc.) '[]'  - 0 Additional assistance / Altered mentation '[]'  - 0 Support Surface(s) Assessment (bed, cushion, seat, etc.) INTERVENTIONS - Miscellaneous '[]'  - 0 External ear exam '[]'  - 0 Patient Transfer (multiple staff / Civil Service fast streamer / Similar devices) '[]'  - 0 Simple  Staple / Suture removal (25 or less) '[]'  - 0 Complex Staple / Suture removal (26 or more) '[]'  - 0 Hypo/Hyperglycemic Management (do not check if billed separately) '[]'  - 0 Ankle / Brachial Index (ABI) - do not check if billed separately Has the patient been seen  at the hospital within the last three years: Yes Total Score: 100 Level Of Care: New/Established - Level 3 Electronic Signature(s) Signed: 05/26/2021 5:00:04 PM By: Alex Pilling RN, BSN Entered By: Alex Jackson on 05/26/2021 11:08:19 -------------------------------------------------------------------------------- Encounter Discharge Information Details Patient Name: Date of Service: Alex Jackson, Alex Alex W. 05/26/2021 9:30 A M Medical Record Number: [id number] Patient Account Number: [id number] Date of Birth/Sex: Treating RN: 1946-01-12 (76 y.o. Alex Jackson Primary Care Alex Jackson: Alex Jackson Other Clinician: Referring Alex Jackson: Treating Alex Jackson/Extender: Alex Jackson in Treatment: 0 Encounter Discharge Information Items Post Procedure Vitals Discharge Condition: Stable Temperature (F): 98.4 Ambulatory Status: Walker Pulse (bpm): 76 Discharge Destination: Home Respiratory Rate (breaths/min): 20 Transportation: Private Auto Blood Pressure (mmHg): 157/87 Accompanied By: self Schedule Follow-up Appointment: Yes Clinical Summary of Care: Electronic Signature(s) Signed: 05/26/2021 5:00:04 PM By: Alex Pilling RN, BSN Entered By: Alex Jackson on 05/26/2021 11:09:33 -------------------------------------------------------------------------------- Lower Extremity Assessment Details Patient Name: Date of Service: Alex Jackson W. 05/26/2021 9:30 A M Medical Record Number: [id number] Patient Account Number: [id number] Date of Birth/Sex: Treating RN: 30-Apr-1946 (76 y.o. Alex Jackson Primary Care Harmony Sandell: Alex Jackson Other Clinician: Referring Brodric Schauer: Treating Lamoine Magallon/Extender:  Alex Jackson in Treatment: 0 Edema Assessment Assessed: Shirlyn Goltz: Yes] Patrice Paradise: No] Edema: [Left: Ye] [Right: s] Calf Left: Right: Point of Measurement: 36 cm From Medial Instep 40 cm Ankle Left: Right: Point of Measurement: 12 cm From Medial Instep 26.5 cm Knee To Floor Left: Right: From Medial Instep 46 cm Vascular Assessment Pulses: Dorsalis Pedis Palpable: [Left:Yes] Doppler Audible: [Left:Yes] Posterior Tibial Palpable: [Left:Yes] Doppler Audible: [Left:Yes] Electronic Signature(s) Signed: 05/26/2021 5:00:04 PM By: Alex Pilling RN, BSN Entered By: Alex Jackson on 05/26/2021 10:00:59 -------------------------------------------------------------------------------- Multi Wound Chart Details Patient Name: Date of Service: Alex Jackson, Alex Alex W. 05/26/2021 9:30 A M Medical Record Number: [id number] Patient Account Number: [id number] Date of Birth/Sex: Treating RN: 04-03-1946 (76 y.o. M) Primary Care Sol Englert: Alex Jackson Other Clinician: Referring Felina Tello: Treating Kymani Shimabukuro/Extender: Alex Jackson in Treatment: 0 Vital Signs Height(in): 71 Pulse(bpm): 6 Weight(lbs): 252 Blood Pressure(mmHg): 157/87 Body Mass Index(BMI): 35.1 Temperature(F): 98.4 Respiratory Rate(breaths/min): 22 Photos: [N/A:N/A] Left T Great oe N/A N/A Wound Location: Blister N/A N/A Wounding Event: Diabetic Wound/Ulcer of the Lower N/A N/A Primary Etiology: Extremity Cataracts, Coronary Artery Disease, N/A N/A Comorbid History: Hypertension, Myocardial Infarction, Type II Diabetes 10/02/2020 N/A N/A Date Acquired: 0 N/A N/A Weeks of Treatment: Open N/A N/A Wound Status: No N/A N/A Wound Recurrence: 0.6x0.6x0.4 N/A N/A Measurements L x W x D (cm) 0.283 N/A N/A A (cm) : rea 0.113 N/A N/A Volume (cm) : 0.00% N/A N/A % Reduction in A rea: 0.00% N/A N/A % Reduction in Volume: Grade 2 N/A N/A Classification: Medium N/A  N/A Exudate A mount: Serosanguineous N/A N/A Exudate Type: red, brown N/A N/A Exudate Color: Distinct, outline attached N/A N/A Wound Margin: Large (67-100%) N/A N/A Granulation A mount: Red N/A N/A Granulation Quality: Small (1-33%) N/A N/A Necrotic A mount: Fat Layer (Subcutaneous Tissue): Yes N/A N/A Exposed Structures: Fascia: No Tendon: No Muscle: No Joint: No Bone: No None N/A N/A Epithelialization: Debridement - Excisional N/A N/A Debridement: Pre-procedure Verification/Time Out 10:50 N/A N/A Taken: Lidocaine 5% topical ointment N/A N/A Pain Control: Callus, Subcutaneous, Slough N/A N/A Tissue Debrided: Skin/Subcutaneous Tissue N/A N/A Level: 2 N/A N/A Debridement A (sq cm): rea Curette N/A N/A Instrument: Minimum N/A N/A Bleeding: Pressure N/A N/A Hemostasis Achieved: 0  N/A N/A Procedural Pain: 0 N/A N/A Post Procedural Pain: Procedure was tolerated well N/A N/A Debridement Treatment Response: 0.6x0.6x0.4 N/A N/A Post Debridement Measurements L x W x D (cm) 0.113 N/A N/A Post Debridement Volume: (cm) callous to periwound. N/A N/A Assessment Notes: Debridement N/A N/A Procedures Performed: Treatment Notes Wound #1 (Toe Great) Wound Laterality: Left Cleanser Soap and Water Discharge Instruction: May shower and wash wound with dial antibacterial soap and water prior to dressing change. Peri-Wound Care Topical Primary Dressing Hydrofera Blue Ready Foam, 2.5 x2.5 in Discharge Instruction: Apply to wound bed as instructed Secondary Dressing Woven Gauze Sponges 2x2 in Discharge Instruction: Apply over primary dressing as directed. Secured With Conforming Stretch Gauze Bandage, Sterile 2x75 (in/in) Discharge Instruction: Secure with stretch gauze as directed. 54M Medipore H Soft Cloth Surgical T ape, 4 x 10 (in/yd) Discharge Instruction: Secure with tape as directed. Compression Wrap Compression Stockings Add-Ons Electronic  Signature(s) Signed: 05/26/2021 12:23:03 PM By: Kalman Shan DO Entered By: Kalman Shan on 05/26/2021 12:12:28 -------------------------------------------------------------------------------- Multi-Disciplinary Care Plan Details Patient Name: Date of Service: Alex Jackson, Alex Alex W. 05/26/2021 9:30 A M Medical Record Number: [id number] Patient Account Number: [id number] Date of Birth/Sex: Treating RN: 11/26/1945 (76 y.o. [name], Tammi Klippel Primary Care Rilyn Scroggs: Alex Jackson Other Clinician: Referring Zyniah Ferraiolo: Treating Arren Laminack/Extender: Alex Jackson in Treatment: 0 Active Inactive Nutrition Nursing Diagnoses: Potential for alteratiion in Nutrition/Potential for imbalanced nutrition Goals: Patient/caregiver agrees to and verbalizes understanding of need to obtain nutritional consultation Date Initiated: 05/26/2021 Target Resolution Date: 06/06/2021 Goal Status: Active Interventions: Assess patient nutrition upon admission and as needed per policy Provide education on nutrition Treatment Activities: Obtain HgA1c : 05/26/2021 Patient referred to Primary Care Physician for further nutritional evaluation : 05/26/2021 Notes: Orientation to the Wound Care Program Nursing Diagnoses: Knowledge deficit related to the wound healing center program Goals: Patient/caregiver will verbalize understanding of the [place] Date Initiated: 05/26/2021 Target Resolution Date: 05/30/2021 Goal Status: Active Interventions: Provide education on orientation to the wound center Notes: Pain, Acute or Chronic Nursing Diagnoses: Pain, acute or chronic: actual or potential Potential alteration in comfort, pain Goals: Patient will verbalize adequate pain control and receive pain control interventions during procedures as needed Date Initiated: 05/26/2021 Target Resolution Date: 06/06/2021 Goal Status: Active Patient/caregiver will verbalize comfort level  met Date Initiated: 05/26/2021 Target Resolution Date: 06/05/2021 Goal Status: Active Interventions: Complete pain assessment as per visit requirements Encourage patient to take pain medications as prescribed Provide education on pain management Reposition patient for comfort Treatment Activities: Administer pain control measures as ordered : 05/26/2021 Notes: Wound/Skin Impairment Nursing Diagnoses: Knowledge deficit related to ulceration/compromised skin integrity Goals: Patient/caregiver will verbalize understanding of skin care regimen Date Initiated: 05/26/2021 Target Resolution Date: 06/06/2021 Goal Status: Active Interventions: Assess patient/caregiver ability to perform ulcer/skin care regimen upon admission and as needed Assess ulceration(s) every visit Provide education on ulcer and skin care Treatment Activities: Skin care regimen initiated : 05/26/2021 Topical wound management initiated : 05/26/2021 Notes: Electronic Signature(s) Signed: 05/26/2021 5:00:04 PM By: Alex Pilling RN, BSN Entered By: Alex Jackson on 05/26/2021 10:53:40 -------------------------------------------------------------------------------- Pain Assessment Details Patient Name: Date of Service: Alex Jackson, Alex Alex W. 05/26/2021 9:30 A M Medical Record Number: [id number] Patient Account Number: [id number] Date of Birth/Sex: Treating RN: 1946/03/13 (76 y.o. Alex Jackson Primary Care Silvie Obremski: Alex Jackson Other Clinician: Referring Treylan Mcclintock: Treating Lanee Chain/Extender: Alex Jackson in Treatment: 0 Active Problems Location of Pain Severity and Description  of Pain Patient Has Paino No Site Locations Rate the pain. Current Pain Level: 0 Pain Management and Medication Current Pain Management: Medication: No Cold Application: No Rest: No Massage: No Activity: No T.E.N.S.: No Heat Application: No Leg drop or elevation: No Is the Current Pain Management Adequate:  Adequate How does your wound impact your activities of daily livingo Sleep: No Bathing: No Appetite: No Relationship With Others: No Bladder Continence: No Emotions: No Bowel Continence: No Work: No Toileting: No Drive: No Dressing: No Hobbies: No Engineer, maintenance) Signed: 05/26/2021 5:00:04 PM By: Alex Pilling RN, BSN Entered By: Alex Jackson on 05/26/2021 10:02:57 -------------------------------------------------------------------------------- Patient/Caregiver Education Details Patient Name: Date of Service: Alex Jackson 1/23/2023andnbsp9:30 A M Medical Record Number: [id number] Patient Account Number: [id number] Date of Birth/Gender: Treating RN: March 24, 1946 (76 y.o. Alex Jackson Primary Care Physician: Alex Jackson Other Clinician: Referring Physician: Treating Physician/Extender: Alex Jackson in Treatment: 0 Education Assessment Education Provided To: Patient Education Topics Provided Nutrition: Handouts: Elevated Blood Sugars: How Do They Affect Wound Healing, Nutrition Methods: Explain/Verbal, Printed Responses: Reinforcements needed [place]: o Handouts: Welcome T [employer] o Methods: Explain/Verbal, Printed Responses: Reinforcements needed Wound/Skin Impairment: Handouts: Caring for Your Ulcer, Skin Care Do's and Dont's Methods: Explain/Verbal, Printed Responses: Reinforcements needed Electronic Signature(s) Signed: 05/26/2021 5:00:04 PM By: Alex Pilling RN, BSN Entered By: Alex Jackson on 05/26/2021 10:54:14 -------------------------------------------------------------------------------- Wound Assessment Details Patient Name: Date of Service: Alex Jackson, Alex Alex W. 05/26/2021 9:30 A M Medical Record Number: [id number] Patient Account Number: [id number] Date of Birth/Sex: Treating RN: 1945-08-11 (76 y.o. [name], Meta.Reding Primary Care Alverta Caccamo: Alex Jackson Other  Clinician: Referring Darran Gabay: Treating Bernd Crom/Extender: Alex Jackson in Treatment: 0 Wound Status Wound Number: 1 Primary Diabetic Wound/Ulcer of the Lower Extremity Etiology: Wound Location: Left T Great oe Wound Open Wounding Event: Blister Status: Date Acquired: 10/02/2020 Comorbid Cataracts, Coronary Artery Disease, Hypertension, Myocardial Weeks Of Treatment: 0 History: Infarction, Type II Diabetes Clustered Wound: No Photos Wound Measurements Length: (cm) 0.6 Width: (cm) 0.6 Depth: (cm) 0.4 Area: (cm) 0.283 Volume: (cm) 0.113 % Reduction in Area: 0% % Reduction in Volume: 0% Epithelialization: None Tunneling: No Undermining: No Wound Description Classification: Grade 2 Wound Margin: Distinct, outline attached Exudate Amount: Medium Exudate Type: Serosanguineous Exudate Color: red, brown Foul Odor After Cleansing: No Slough/Fibrino Yes Wound Bed Granulation Amount: Large (67-100%) Exposed Structure Granulation Quality: Red Fascia Exposed: No Necrotic Amount: Small (1-33%) Fat Layer (Subcutaneous Tissue) Exposed: Yes Necrotic Quality: Adherent Slough Tendon Exposed: No Muscle Exposed: No Joint Exposed: No Bone Exposed: No Assessment Notes callous to periwound. Treatment Notes Wound #1 (Toe Great) Wound Laterality: Left Cleanser Soap and Water Discharge Instruction: May shower and wash wound with dial antibacterial soap and water prior to dressing change. Peri-Wound Care Topical Primary Dressing Hydrofera Blue Ready Foam, 2.5 x2.5 in Discharge Instruction: Apply to wound bed as instructed Secondary Dressing Woven Gauze Sponges 2x2 in Discharge Instruction: Apply over primary dressing as directed. Secured With Conforming Stretch Gauze Bandage, Sterile 2x75 (in/in) Discharge Instruction: Secure with stretch gauze as directed. 77M Medipore H Soft Cloth Surgical T ape, 4 x 10 (in/yd) Discharge Instruction: Secure with tape  as directed. Compression Wrap Compression Stockings Add-Ons Electronic Signature(s) Signed: 05/26/2021 5:00:04 PM By: Alex Pilling RN, BSN Entered By: Alex Jackson on 05/26/2021 10:03:09 -------------------------------------------------------------------------------- Vitals Details Patient Name: Date of Service: Alex Jackson, Alex Alex W. 05/26/2021 9:30 A  M Medical Record Number: [id number] Patient Account Number: [id number] Date of Birth/Sex: Treating RN: 1946/02/14 (76 y.o. [name], Meta.Reding Primary Care Verlon Carcione: Alex Jackson Other Clinician: Referring Nyeli Holtmeyer: Treating Graesyn Schreifels/Extender: Alex Jackson in Treatment: 0 Vital Signs Time Taken: 09:35 Temperature (F): 98.4 Height (in): 71 Pulse (bpm): 76 Source: Stated Respiratory Rate (breaths/min): 22 Weight (lbs): 252 Blood Pressure (mmHg): 157/87 Source: Stated Reference Range: 80 - 120 mg / dl Body Mass Index (BMI): 35.1 Electronic Signature(s) Signed: 05/26/2021 5:00:04 PM By: Alex Pilling RN, BSN Entered By: Alex Jackson on 05/26/2021 09:38:53

## 2021-05-27 DIAGNOSIS — R262 Difficulty in walking, not elsewhere classified: Secondary | ICD-10-CM | POA: Diagnosis not present

## 2021-05-27 DIAGNOSIS — E11621 Type 2 diabetes mellitus with foot ulcer: Secondary | ICD-10-CM | POA: Diagnosis not present

## 2021-05-27 DIAGNOSIS — U071 COVID-19: Secondary | ICD-10-CM | POA: Diagnosis not present

## 2021-05-27 DIAGNOSIS — M25652 Stiffness of left hip, not elsewhere classified: Secondary | ICD-10-CM | POA: Diagnosis not present

## 2021-05-27 DIAGNOSIS — M25651 Stiffness of right hip, not elsewhere classified: Secondary | ICD-10-CM | POA: Diagnosis not present

## 2021-05-27 DIAGNOSIS — L97522 Non-pressure chronic ulcer of other part of left foot with fat layer exposed: Secondary | ICD-10-CM | POA: Diagnosis not present

## 2021-05-27 DIAGNOSIS — M5117 Intervertebral disc disorders with radiculopathy, lumbosacral region: Secondary | ICD-10-CM | POA: Diagnosis not present

## 2021-05-27 DIAGNOSIS — J069 Acute upper respiratory infection, unspecified: Secondary | ICD-10-CM | POA: Diagnosis not present

## 2021-05-29 ENCOUNTER — Encounter (HOSPITAL_BASED_OUTPATIENT_CLINIC_OR_DEPARTMENT_OTHER): Payer: PPO | Admitting: Internal Medicine

## 2021-05-31 LAB — AEROBIC/ANAEROBIC CULTURE W GRAM STAIN (SURGICAL/DEEP WOUND): Gram Stain: NONE SEEN

## 2021-06-02 ENCOUNTER — Other Ambulatory Visit: Payer: Self-pay | Admitting: Neurological Surgery

## 2021-06-02 ENCOUNTER — Encounter (HOSPITAL_BASED_OUTPATIENT_CLINIC_OR_DEPARTMENT_OTHER): Payer: PPO | Admitting: Internal Medicine

## 2021-06-02 DIAGNOSIS — M5416 Radiculopathy, lumbar region: Secondary | ICD-10-CM | POA: Diagnosis not present

## 2021-06-02 DIAGNOSIS — M48061 Spinal stenosis, lumbar region without neurogenic claudication: Secondary | ICD-10-CM | POA: Diagnosis not present

## 2021-06-02 DIAGNOSIS — M47816 Spondylosis without myelopathy or radiculopathy, lumbar region: Secondary | ICD-10-CM | POA: Diagnosis not present

## 2021-06-02 DIAGNOSIS — I10 Essential (primary) hypertension: Secondary | ICD-10-CM | POA: Diagnosis not present

## 2021-06-03 ENCOUNTER — Ambulatory Visit
Admission: RE | Admit: 2021-06-03 | Discharge: 2021-06-03 | Disposition: A | Discharge status: Home / Self Care | Payer: PPO | Source: Ambulatory Visit | Attending: Internal Medicine | Admitting: Internal Medicine

## 2021-06-03 ENCOUNTER — Other Ambulatory Visit: Payer: Self-pay | Admitting: Internal Medicine

## 2021-06-03 ENCOUNTER — Ambulatory Visit
Admission: RE | Admit: 2021-06-03 | Discharge: 2021-06-03 | Disposition: A | Discharge status: Home / Self Care | Payer: PPO | Source: Ambulatory Visit | Attending: Neurological Surgery | Admitting: Neurological Surgery

## 2021-06-03 ENCOUNTER — Other Ambulatory Visit: Payer: Self-pay

## 2021-06-03 DIAGNOSIS — E11621 Type 2 diabetes mellitus with foot ulcer: Secondary | ICD-10-CM | POA: Diagnosis not present

## 2021-06-03 DIAGNOSIS — S91102A Unspecified open wound of left great toe without damage to nail, initial encounter: Secondary | ICD-10-CM | POA: Diagnosis not present

## 2021-06-03 DIAGNOSIS — L97529 Non-pressure chronic ulcer of other part of left foot with unspecified severity: Secondary | ICD-10-CM

## 2021-06-03 DIAGNOSIS — M5416 Radiculopathy, lumbar region: Secondary | ICD-10-CM

## 2021-06-03 DIAGNOSIS — M545 Low back pain, unspecified: Secondary | ICD-10-CM | POA: Diagnosis not present

## 2021-06-03 MED ORDER — METHYLPREDNISOLONE ACETATE 40 MG/ML INJ SUSP (RADIOLOG
80.0000 mg | Freq: Once | INTRAMUSCULAR | Status: AC
  Administered 2021-06-03: 80 mg via EPIDURAL

## 2021-06-03 MED ORDER — IOPAMIDOL (ISOVUE-M 200) INJECTION 41%
1.0000 mL | Freq: Once | INTRAMUSCULAR | Status: AC
  Administered 2021-06-03: 1 mL via EPIDURAL

## 2021-06-03 NOTE — Discharge Instructions (Signed)

## 2021-06-04 DIAGNOSIS — M25651 Stiffness of right hip, not elsewhere classified: Secondary | ICD-10-CM | POA: Diagnosis not present

## 2021-06-04 DIAGNOSIS — M25652 Stiffness of left hip, not elsewhere classified: Secondary | ICD-10-CM | POA: Diagnosis not present

## 2021-06-04 DIAGNOSIS — R262 Difficulty in walking, not elsewhere classified: Secondary | ICD-10-CM | POA: Diagnosis not present

## 2021-06-04 DIAGNOSIS — M5117 Intervertebral disc disorders with radiculopathy, lumbosacral region: Secondary | ICD-10-CM | POA: Diagnosis not present

## 2021-06-06 ENCOUNTER — Other Ambulatory Visit (HOSPITAL_COMMUNITY): Payer: Self-pay | Admitting: Internal Medicine

## 2021-06-06 ENCOUNTER — Encounter (HOSPITAL_BASED_OUTPATIENT_CLINIC_OR_DEPARTMENT_OTHER): Payer: PPO | Attending: Internal Medicine | Admitting: Internal Medicine

## 2021-06-06 ENCOUNTER — Other Ambulatory Visit: Payer: Self-pay

## 2021-06-06 DIAGNOSIS — L97522 Non-pressure chronic ulcer of other part of left foot with fat layer exposed: Secondary | ICD-10-CM | POA: Diagnosis not present

## 2021-06-06 DIAGNOSIS — E785 Hyperlipidemia, unspecified: Secondary | ICD-10-CM | POA: Diagnosis not present

## 2021-06-06 DIAGNOSIS — M5117 Intervertebral disc disorders with radiculopathy, lumbosacral region: Secondary | ICD-10-CM | POA: Diagnosis not present

## 2021-06-06 DIAGNOSIS — M545 Low back pain, unspecified: Secondary | ICD-10-CM | POA: Insufficient documentation

## 2021-06-06 DIAGNOSIS — E11621 Type 2 diabetes mellitus with foot ulcer: Secondary | ICD-10-CM | POA: Diagnosis not present

## 2021-06-06 DIAGNOSIS — M25651 Stiffness of right hip, not elsewhere classified: Secondary | ICD-10-CM | POA: Diagnosis not present

## 2021-06-06 DIAGNOSIS — R262 Difficulty in walking, not elsewhere classified: Secondary | ICD-10-CM | POA: Diagnosis not present

## 2021-06-06 DIAGNOSIS — L97529 Non-pressure chronic ulcer of other part of left foot with unspecified severity: Secondary | ICD-10-CM

## 2021-06-06 DIAGNOSIS — E08621 Diabetes mellitus due to underlying condition with foot ulcer: Secondary | ICD-10-CM

## 2021-06-06 DIAGNOSIS — M25652 Stiffness of left hip, not elsewhere classified: Secondary | ICD-10-CM | POA: Diagnosis not present

## 2021-06-06 DIAGNOSIS — E1142 Type 2 diabetes mellitus with diabetic polyneuropathy: Secondary | ICD-10-CM | POA: Diagnosis not present

## 2021-06-06 DIAGNOSIS — Z955 Presence of coronary angioplasty implant and graft: Secondary | ICD-10-CM | POA: Insufficient documentation

## 2021-06-09 NOTE — Progress Notes (Signed)
JLYN, BRACAMONTE ([id number]) Visit Report for 06/06/2021 Arrival Information Details Patient Name: Date of Service: Alex Jackson. 06/06/2021 8:45 A M Medical Record Number: [id number] Patient Account Number: [id number] Date of Birth/Sex: Treating RN: 1945-07-24 (76 y.o. Ernestene Mention Primary Care Roberta Angell: [place] Pepper Other Clinician: Referring Jakyron Fabro: Treating Brandee Markin/Extender: Merilyn Baba in Treatment: 1 Visit Information History Since Last Visit Added or deleted any medications: No Patient Arrived: Alex Jackson Any new allergies or adverse reactions: No Arrival Time: 08:51 Had a fall or experienced change in No Accompanied By: self activities of daily living that may affect Transfer Assistance: None risk of falls: Patient Identification Verified: Yes Signs or symptoms of abuse/neglect since last visito No Secondary Verification Process Completed: Yes Hospitalized since last visit: No Patient Requires Transmission-Based Precautions: No Implantable device outside of the clinic excluding No Patient Has Alerts: Yes cellular tissue based products placed in the center Patient Alerts: 11/18/20 ABI L1.04 since last visit: 11/18/20 TBI L 1.35 Has Dressing in Place as Prescribed: Yes Pain Present Now: No Electronic Signature(s) Signed: 06/09/2021 4:37:55 PM By: Baruch Gouty RN, BSN Entered By: Baruch Gouty on 06/06/2021 08:54:02 -------------------------------------------------------------------------------- Clinic Level of Care Assessment Details Patient Name: Date of Service: Alex [place] W. 06/06/2021 8:45 A M Medical Record Number: [id number] Patient Account Number: [id number] Date of Birth/Sex: Treating RN: 06-29-1945 (76 y.o. Ernestene Mention Primary Care Josecarlos Harriott: London Pepper Other Clinician: Referring Milana Salay: Treating Zeta Bucy/Extender: Merilyn Baba in Treatment: 1 Clinic Level of Care Assessment  Items TOOL 4 Quantity Score _0  - 0 Use when only an EandM is performed on FOLLOW-UP visit ASSESSMENTS - Nursing Assessment / Reassessment X- 1 10 Reassessment of Co-morbidities (includes updates in patient status) X- 1 5 Reassessment of Adherence to Treatment Plan ASSESSMENTS - Wound and Skin A ssessment / Reassessment X - Simple Wound Assessment / Reassessment - one wound 1 5 _1  - 0 Complex Wound Assessment / Reassessment - multiple wounds _2  - 0 Dermatologic / Skin Assessment (not related to wound area) ASSESSMENTS - Focused Assessment _3  - 0 Circumferential Edema Measurements - multi extremities _4  - 0 Nutritional Assessment / Counseling / Intervention X- 1 5 Lower Extremity Assessment (monofilament, tuning fork, pulses) _5  - 0 Peripheral Arterial Disease Assessment (using hand held doppler) ASSESSMENTS - Ostomy and/or Continence Assessment and Care _6  - 0 Incontinence Assessment and Management _7  - 0 Ostomy Care Assessment and Management (repouching, etc.) PROCESS - Coordination of Care X - Simple Patient / Family Education for ongoing care 1 15 _8  - 0 Complex (extensive) Patient / Family Education for ongoing care X- 1 10 Staff obtains Programmer, systems, Records, T Results / Process Orders est _9  - 0 Staff telephones HHA, Nursing Homes / Clarify orders / etc _10  - 0 Routine Transfer to another Facility (non-emergent condition) _11  - 0 Routine Hospital Admission (non-emergent condition) _12  - 0 New Admissions / Biomedical engineer / Ordering NPWT Apligraf, etc. , _13  - 0 Emergency Hospital Admission (emergent condition) X- 1 10 Simple Discharge Coordination _14  - 0 Complex (extensive) Discharge Coordination PROCESS - Special Needs _15  - 0 Pediatric / Minor Patient Management _16  - 0 Isolation Patient Management _17  - 0 Hearing / Language / Visual special needs _18  - 0 Assessment of Community assistance (transportation, D/C planning, etc.) _19  - 0 Additional  assistance / Altered mentation _20  - 0 Support Surface(s) Assessment (bed, cushion, seat, etc.) INTERVENTIONS - Wound Cleansing / Measurement X - Simple Wound  Cleansing - one wound 1 5 _0  - 0 Complex Wound Cleansing - multiple wounds X- 1 5 Wound Imaging (photographs - any number of wounds) _1  - 0 Wound Tracing (instead of photographs) X- 1 5 Simple Wound Measurement - one wound _2  - 0 Complex Wound Measurement - multiple wounds INTERVENTIONS - Wound Dressings X - Small Wound Dressing one or multiple wounds 1 10 _3  - 0 Medium Wound Dressing one or multiple wounds _4  - 0 Large Wound Dressing one or multiple wounds X- 1 5 Application of Medications - topical <[id number]>_1<\/[id number]>_4  - 0 Application of Medications - injection INTERVENTIONS - Miscellaneous _6  - 0 External ear exam _7  - 0 Specimen Collection (cultures, biopsies, blood, body fluids, etc.) _8  - 0 Specimen(s) / Culture(s) sent or taken to Lab for analysis _9  - 0 Patient Transfer (multiple staff / Civil Service fast streamer / Similar devices) _10  - 0 Simple Staple / Suture removal (25 or less) _11  - 0 Complex Staple / Suture removal (26 or more) _12  - 0 Hypo / Hyperglycemic Management (close monitor of Blood Glucose) _13  - 0 Ankle / Brachial Index (ABI) - do not check if billed separately X- 1 5 Vital Signs Has the patient been seen at the hospital within the last three years: Yes Total Score: 95 Level Of Care: New/Established - Level 3 Electronic Signature(s) Signed: 06/09/2021 4:37:55 PM By: Baruch Gouty RN, BSN Entered By: Baruch Gouty on 06/06/2021 09:57:29 -------------------------------------------------------------------------------- Encounter Discharge Information Details Patient Name: Date of Service: Alex Jackson, Alex [place] W. 06/06/2021 8:45 A M Medical Record Number: [id number] Patient Account Number: [id number] Date of Birth/Sex: Treating RN: [birth year] (76 y.o. Ernestene Mention Primary Care Veryl Winemiller: [place] Pepper Other  Clinician: Referring Ellory Khurana: Treating Dinna Severs/Extender: Merilyn Baba in Treatment: 1 Encounter Discharge Information Items Discharge Condition: Stable Ambulatory Status: Cane Discharge Destination: Home Transportation: Private Auto Accompanied By: self Schedule Follow-up Appointment: Yes Clinical Summary of Care: Patient Declined Electronic Signature(s) Signed: 06/09/2021 4:37:55 PM By: Baruch Gouty RN, BSN Entered By: Baruch Gouty on 06/06/2021 09:59:09 -------------------------------------------------------------------------------- Lower Extremity Assessment Details Patient Name: Date of Service: Alex [place] W. 06/06/2021 8:45 A M Medical Record Number: [id number] Patient Account Number: [id number] Date of Birth/Sex: Treating RN: 03-Nov-1945 (76 y.o. Ernestene Mention Primary Care Rainier Feuerborn: [place] Pepper Other Clinician: Referring Mauricia Mertens: Treating Ames Hoban/Extender: Merilyn Baba in Treatment: 1 Edema Assessment Assessed: Shirlyn Goltz: No] [Right: No] Edema: [Left: N] [Right: o] Calf Left: Right: Point of Measurement: 36 cm From Medial Instep 40 cm Ankle Left: Right: Point of Measurement: 12 cm From Medial Instep 26.5 cm Vascular Assessment Pulses: Dorsalis Pedis Palpable: [Left:Yes] Electronic Signature(s) Signed: 06/09/2021 4:37:55 PM By: Baruch Gouty RN, BSN Entered By: Baruch Gouty on 06/06/2021 08:58:13 -------------------------------------------------------------------------------- Multi Wound Chart Details Patient Name: Date of Service: Alex Jackson, Alex W. 06/06/2021 8:45 A M Medical Record Number: [id number] Patient Account Number: [id number] Date of Birth/Sex: Treating RN: [birth year] (76 y.o. Ernestene Mention Primary Care Rekia Kujala: [place] Pepper Other Clinician: Referring Clarice Bonaventure: Treating Rahma Meller/Extender: Merilyn Baba in Treatment: 1 Vital Signs Height(in):  33 Pulse(bpm): 68 Weight(lbs): 19 Blood Pressure(mmHg): 150/87 Body Mass Index(BMI): 35.1 Temperature(F): 97.7 Respiratory Rate(breaths/min): 20 Photos: [N/A:N/A] Left T Great oe N/A N/A Wound Location: Blister N/A N/A Wounding Event: Diabetic Wound/Ulcer of the Lower N/A N/A Primary Etiology: Extremity Cataracts, Coronary Artery Disease, N/A N/A Comorbid History: Hypertension, Myocardial Infarction, Type II Diabetes 10/02/2020 N/A N/A Date Acquired: 1 N/A N/A Weeks of  Treatment: Open N/A N/A Wound Status: No N/A N/A Wound Recurrence: 0.6x0.5x0.4 N/A N/A Measurements L x W x D (cm) 0.236 N/A N/A A (cm) : rea 0.094 N/A N/A Volume (cm) : 16.60% N/A N/A % Reduction in A rea: 16.80% N/A N/A % Reduction in Volume: 12 Starting Position 1 (o'clock): 12 Ending Position 1 (o'clock): 0.4 Maximum Distance 1 (cm): Yes N/A N/A Undermining: Grade 2 N/A N/A Classification: Medium N/A N/A Exudate A mount: Serosanguineous N/A N/A Exudate Type: red, brown N/A N/A Exudate Color: Distinct, outline attached N/A N/A Wound Margin: Large (67-100%) N/A N/A Granulation A mount: Red N/A N/A Granulation Quality: None Present (0%) N/A N/A Necrotic A mount: Fat Layer (Subcutaneous Tissue): Yes N/A N/A Exposed Structures: Fascia: No Tendon: No Muscle: No Joint: No Bone: No None N/A N/A Epithelialization: Treatment Notes Wound #1 (Toe Great) Wound Laterality: Left Cleanser Soap and Water Discharge Instruction: May shower and wash wound with dial antibacterial soap and water prior to dressing change. Peri-Wound Care Topical Gentamicin Discharge Instruction: As directed by physician Primary Dressing Hydrofera Blue Ready Foam, 2.5 x2.5 in Discharge Instruction: Apply to wound bed as instructed Secondary Dressing Woven Gauze Sponges 2x2 in Discharge Instruction: Apply over primary dressing and under the toe rolled gauze as directed. Optifoam Non-Adhesive  Dressing, 4x4 in Discharge Instruction: Apply over primary dressing cut to make foam donut Secured With Conforming Stretch Gauze Bandage, Sterile 2x75 (in/in) Discharge Instruction: Secure with stretch gauze as directed. 75M Medipore H Soft Cloth Surgical T ape, 4 x 10 (in/yd) Discharge Instruction: Secure with tape as directed. Compression Wrap Compression Stockings Add-Ons Electronic Signature(s) Signed: 06/06/2021 10:31:24 AM By: Kalman Shan DO Signed: 06/09/2021 4:37:55 PM By: Baruch Gouty RN, BSN Entered By: Kalman Shan on 06/06/2021 10:21:51 -------------------------------------------------------------------------------- [place] Details Patient Name: Date of Service: Alex Jackson, Alex [place] W. 06/06/2021 8:45 A M Medical Record Number: [id number] Patient Account Number: [id number] Date of Birth/Sex: Treating RN: 10-20-1945 (76 y.o. Ernestene Mention Primary Care Zenita Kister: [place] Pepper Other Clinician: Referring Teo Moede: Treating Rivkah Wolz/Extender: Merilyn Baba in Treatment: 1 Multidisciplinary Care Plan reviewed with physician Active Inactive Nutrition Nursing Diagnoses: Potential for alteratiion in Nutrition/Potential for imbalanced nutrition Goals: Patient/caregiver agrees to and verbalizes understanding of need to obtain nutritional consultation Date Initiated: 05/26/2021 Date Inactivated: 06/06/2021 Target Resolution Date: 06/06/2021 Goal Status: Met Patient/caregiver will maintain therapeutic glucose control Date Initiated: 06/06/2021 Target Resolution Date: 07/04/2021 Goal Status: Active Interventions: Assess patient nutrition upon admission and as needed per policy Provide education on nutrition Treatment Activities: Education provided on Nutrition : 05/26/2021 Obtain HgA1c : 05/26/2021 Patient referred to Primary Care Physician for further nutritional evaluation : 05/26/2021 Notes: Wound/Skin Impairment Nursing  Diagnoses: Knowledge deficit related to ulceration/compromised skin integrity Goals: Patient/caregiver will verbalize understanding of skin care regimen Date Initiated: 05/26/2021 Target Resolution Date: 07/04/2021 Goal Status: Active Ulcer/skin breakdown will have a volume reduction of 50% by week 8 Date Initiated: 06/06/2021 Target Resolution Date: 07/04/2021 Goal Status: Active Interventions: Assess patient/caregiver ability to perform ulcer/skin care regimen upon admission and as needed Assess ulceration(s) every visit Provide education on ulcer and skin care Treatment Activities: Skin care regimen initiated : 05/26/2021 Topical wound management initiated : 05/26/2021 Notes: Electronic Signature(s) Signed: 06/09/2021 4:37:55 PM By: Baruch Gouty RN, BSN Entered By: Baruch Gouty on 06/06/2021 09:05:32 -------------------------------------------------------------------------------- Pain Assessment Details Patient Name: Date of Service: Alex Jackson, Alex W. 06/06/2021 8:45 A M Medical Record Number: [id number] Patient Account Number: [id number] Date  of Birth/Sex: Treating RN: 1946-01-04 (76 y.o. Ernestene Mention Primary Care Kamarie Veno: [place] Pepper Other Clinician: Referring Cayson Kalb: Treating Ijeoma Loor/Extender: Merilyn Baba in Treatment: 1 Active Problems Location of Pain Severity and Description of Pain Patient Has Paino No Site Locations Rate the pain. Rate the pain. Current Pain Level: 0 Pain Management and Medication Current Pain Management: Notes reports chronic back pain Electronic Signature(s) Signed: 06/09/2021 4:37:55 PM By: Baruch Gouty RN, BSN Entered By: Baruch Gouty on 06/06/2021 08:55:44 -------------------------------------------------------------------------------- Patient/Caregiver Education Details Patient Name: Date of Service: Alex Jackson 2/3/2023andnbsp8:45 A M Medical Record Number: [id number] Patient  Account Number: [id number] Date of Birth/Gender: Treating RN: 1945/06/17 (76 y.o. Ernestene Mention Primary Care Physician: [place] Pepper Other Clinician: Referring Physician: Treating Physician/Extender: Merilyn Baba in Treatment: 1 Education Assessment Education Provided To: Patient Education Topics Provided Offloading: Methods: Explain/Verbal Responses: Reinforcements needed, State content correctly Wound/Skin Impairment: Methods: Explain/Verbal Responses: Reinforcements needed, State content correctly Electronic Signature(s) Signed: 06/09/2021 4:37:55 PM By: Baruch Gouty RN, BSN Entered By: Baruch Gouty on 06/06/2021 09:06:09 -------------------------------------------------------------------------------- Wound Assessment Details Patient Name: Date of Service: Alex [place] W. 06/06/2021 8:45 A M Medical Record Number: [id number] Patient Account Number: [id number] Date of Birth/Sex: Treating RN: Sep 15, 1945 (76 y.o. Ernestene Mention Primary Care Alexiana Laverdure: [place] Pepper Other Clinician: Referring Willy Vorce: Treating Arlayne Liggins/Extender: Merilyn Baba in Treatment: 1 Wound Status Wound Number: 1 Primary Diabetic Wound/Ulcer of the Lower Extremity Etiology: Wound Location: Left T Great oe Wound Open Wounding Event: Blister Status: Date Acquired: 10/02/2020 Comorbid Cataracts, Coronary Artery Disease, Hypertension, Myocardial Weeks Of Treatment: 1 History: Infarction, Type II Diabetes Clustered Wound: No Photos Wound Measurements Length: (cm) 0.6 Width: (cm) 0.5 Depth: (cm) 0.4 Area: (cm) 0.236 Volume: (cm) 0.094 % Reduction in Area: 16.6% % Reduction in Volume: 16.8% Epithelialization: None Undermining: Yes Starting Position (o'clock): 12 Ending Position (o'clock): 12 Maximum Distance: (cm) 0.4 Wound Description Classification: Grade 2 Wound Margin: Distinct, outline attached Exudate Amount:  Medium Exudate Type: Serosanguineous Exudate Color: red, brown Foul Odor After Cleansing: No Slough/Fibrino Yes Wound Bed Granulation Amount: Large (67-100%) Exposed Structure Granulation Quality: Red Fascia Exposed: No Necrotic Amount: None Present (0%) Fat Layer (Subcutaneous Tissue) Exposed: Yes Tendon Exposed: No Muscle Exposed: No Joint Exposed: No Bone Exposed: No Treatment Notes Wound #1 (Toe Great) Wound Laterality: Left Cleanser Soap and Water Discharge Instruction: May shower and wash wound with dial antibacterial soap and water prior to dressing change. Peri-Wound Care Topical Gentamicin Discharge Instruction: As directed by physician Primary Dressing Hydrofera Blue Ready Foam, 2.5 x2.5 in Discharge Instruction: Apply to wound bed as instructed Secondary Dressing Woven Gauze Sponges 2x2 in Discharge Instruction: Apply over primary dressing and under the toe rolled gauze as directed. Optifoam Non-Adhesive Dressing, 4x4 in Discharge Instruction: Apply over primary dressing cut to make foam donut Secured With Conforming Stretch Gauze Bandage, Sterile 2x75 (in/in) Discharge Instruction: Secure with stretch gauze as directed. 49M Medipore H Soft Cloth Surgical T ape, 4 x 10 (in/yd) Discharge Instruction: Secure with tape as directed. Compression Wrap Compression Stockings Add-Ons Electronic Signature(s) Signed: 06/09/2021 4:37:55 PM By: Baruch Gouty RN, BSN Entered By: Baruch Gouty on 06/06/2021 09:03:50 -------------------------------------------------------------------------------- Vitals Details Patient Name: Date of Service: Alex Jackson, Alex [place] W. 06/06/2021 8:45 A M Medical Record Number: [id number] Patient Account Number: [id number] Date of Birth/Sex: Treating RN: 03/08/1946 (76 y.o. Ernestene Mention Primary Care Kaio Kuhlman: [place] Pepper Other Clinician: Referring Shailey Butterbaugh:  Treating Ladale Sherburn/Extender: Merilyn Baba in  Treatment: 1 Vital Signs Time Taken: 08:54 Temperature (F): 97.7 Height (in): 71 Pulse (bpm): 75 Source: Stated Respiratory Rate (breaths/min): 20 Weight (lbs): 252 Blood Pressure (mmHg): 150/87 Source: Stated Reference Range: 80 - 120 mg / dl Body Mass Index (BMI): 35.1 Electronic Signature(s) Signed: 06/09/2021 4:37:55 PM By: Baruch Gouty RN, BSN Entered By: Baruch Gouty on 06/06/2021 08:56:04

## 2021-06-09 NOTE — Progress Notes (Signed)
Alex Jackson, [name] ([id number]) Visit Report for 06/06/2021 Chief Complaint Document Details Patient Name: Date of Service: Alex Jackson. 06/06/2021 8:45 A M Medical Record Number: [id number] Patient Account Number: [id number] Date of Birth/Sex: Treating RN: 27-Mar-1946 (76 y.o. Ernestene Mention Primary Care Provider: London Pepper Other Clinician: Referring Provider: Treating Provider/Extender: Merilyn Baba in Treatment: 1 Information Obtained from: Patient Chief Complaint Left great toe wound Electronic Signature(s) Signed: 06/06/2021 10:31:24 AM By: Kalman Shan DO Entered By: Kalman Shan on 06/06/2021 10:22:04 -------------------------------------------------------------------------------- HPI Details Patient Name: Date of Service: Alex Jackson, [name] 06/06/2021 8:45 A M Medical Record Number: [id number] Patient Account Number: [id number] Date of Birth/Sex: Treating RN: 02-17-1946 (76 y.o. Ernestene Mention Primary Care Provider: London Pepper Other Clinician: Referring Provider: Treating Provider/Extender: Merilyn Baba in Treatment: 1 History of Present Illness HPI Description: Admission 05/26/2021 Mr. Kaylan Fenger is a 76 year old male with a past medical history of controlled type 2 diabetes on oral agents, chronic back pain and coronary artery disease status post stenting to the LAD that presents to the clinic for a 9-month history of nonhealing wound to the left great toe plantar aspect. He has peripheral neuropathy and is not sure how the wound developed. He has been following with Dr. Doran Durand for this issue and has been using Betadine to the area daily. He does not offload the area. He denies signs of infection. 2/3; patient presents for follow-up. He obtained his left foot x-ray. He has been using gentamicin with hydrofera blue toe to the wound bed with dressing changes. He has no issues or complaints today.  He denies signs of infection. Electronic Signature(s) Signed: 06/06/2021 10:31:24 AM By: Kalman Shan DO Entered By: Kalman Shan on 06/06/2021 10:22:56 -------------------------------------------------------------------------------- Physical Exam Details Patient Name: Date of Service: Alex [place] W. 06/06/2021 8:45 A M Medical Record Number: [id number] Patient Account Number: [id number] Date of Birth/Sex: Treating RN: 09-06-1945 (76 y.o. Ernestene Mention Primary Care Provider: Other Clinician: London Pepper Referring Provider: Treating Provider/Extender: Merilyn Baba in Treatment: 1 Constitutional respirations regular, non-labored and within target range for patient.. Cardiovascular 2+ dorsalis pedis/posterior tibialis pulses. Psychiatric pleasant and cooperative. Notes Left great toe: T the plantar aspect there is an open wound with granulation tissue and circumferential callus with undermining. No surrounding signs of o infection. Electronic Signature(s) Signed: 06/06/2021 10:31:24 AM By: Kalman Shan DO Entered By: Kalman Shan on 06/06/2021 10:23:45 -------------------------------------------------------------------------------- Physician Orders Details Patient Name: Date of Service: Alex Jackson, [name] 06/06/2021 8:45 A M Medical Record Number: [id number] Patient Account Number: [id number] Date of Birth/Sex: Treating RN: 12/28/1945 (76 y.o. Ernestene Mention Primary Care Provider: London Pepper Other Clinician: Referring Provider: Treating Provider/Extender: Merilyn Baba in Treatment: 1 Verbal / Phone Orders: No Diagnosis Coding ICD-10 Coding Code Description [phone] Non-pressure chronic ulcer of other part of left foot with fat layer exposed E11.621 Type 2 diabetes mellitus with foot ulcer Z95.5 Presence of coronary angioplasty implant and graft E78.5 Hyperlipidemia, unspecified M54.50 Low back  pain, unspecified Follow-up Appointments ppointment in 2 weeks. - Dr. Heber Waverly Return A Other: - [employer]- wound care supplier Bathing/ Shower/ Hygiene May shower and wash wound with soap and water. - with dressing changes. Edema Control - Lymphedema / SCD / Other Elevate legs to the level of the heart or above for 30 minutes daily and/or when sitting, a frequency of: - 3-4 times  a day throughout the day. Avoid standing for long periods of time. Moisturize legs daily. - every night before bed. Wound Treatment Wound #1 - T Great oe Wound Laterality: Left Cleanser: Soap and Water 1 x Per Day/30 Days Discharge Instructions: May shower and wash wound with dial antibacterial soap and water prior to dressing change. Topical: Gentamicin 1 x Per Day/30 Days Discharge Instructions: As directed by physician Prim Dressing: Hydrofera Blue Ready Foam, 2.5 x2.5 in 1 x Per Day/30 Days ary Discharge Instructions: Apply to wound bed as instructed Secondary Dressing: Woven Gauze Sponges 2x2 in 1 x Per Day/30 Days Discharge Instructions: Apply over primary dressing and under the toe rolled gauze as directed. Secondary Dressing: Optifoam Non-Adhesive Dressing, 4x4 in 1 x Per Day/30 Days Discharge Instructions: Apply over primary dressing cut to make foam donut Secured With: Conforming Stretch Gauze Bandage, Sterile 2x75 (in/in) 1 x Per Day/30 Days Discharge Instructions: Secure with stretch gauze as directed. Secured With: 64M Medipore H Soft Cloth Surgical T ape, 4 x 10 (in/yd) 1 x Per Day/30 Days Discharge Instructions: Secure with tape as directed. Radiology MRI, lower extremity with/without contrast left foot - diabetic foot ulcer left great toe, R/O osteomyelitis - (ICD10 E11.621 - Type 2 diabetes mellitus with foot ulcer) Electronic Signature(s) Signed: 06/06/2021 10:31:24 AM By: Kalman Shan DO Entered By: Kalman Shan on 06/06/2021 10:24:03 Prescription  06/06/2021 -------------------------------------------------------------------------------- Alex Jackson. Kalman Shan DO Patient Name: Provider: 02-Nov-1945 [id number] Date of Birth: NPI#: Jerilynn Mages [id number] Sex: DEA #: [phone] Phone #: License #: [place] Patient Address: [address], [place] [address] [place], [place] [address] [phone] Allergies prednisone Provider's Orders MRI, lower extremity with/without contrast left foot - ICD10: E11.621 - diabetic foot ulcer left great toe, R/O osteomyelitis Hand Signature: Date(s): Electronic Signature(s) Signed: 06/06/2021 10:31:24 AM By: Kalman Shan DO Entered By: Kalman Shan on 06/06/2021 10:24:03 -------------------------------------------------------------------------------- Problem List Details Patient Name: Date of Service: Alex Jackson, [place] W. 06/06/2021 8:45 A M Medical Record Number: [id number] Patient Account Number: [id number] Date of Birth/Sex: Treating RN: 11-10-1945 (76 y.o. Ernestene Mention Primary Care Provider: London Pepper Other Clinician: Referring Provider: Treating Provider/Extender: Merilyn Baba in Treatment: 1 Active Problems ICD-10 Encounter Code Description Active Date MDM Diagnosis [phone] Non-pressure chronic ulcer of other part of left foot with fat layer exposed 05/26/2021 No Yes E11.621 Type 2 diabetes mellitus with foot ulcer 05/26/2021 No Yes Z95.5 Presence of coronary angioplasty implant and graft 05/26/2021 No Yes E78.5 Hyperlipidemia, unspecified 05/26/2021 No Yes M54.50 Low back pain, unspecified 05/26/2021 No Yes Inactive Problems Resolved Problems Electronic Signature(s) Signed: 06/06/2021 10:31:24 AM By: Kalman Shan DO Entered By: Kalman Shan on 06/06/2021  10:21:27 -------------------------------------------------------------------------------- Progress Note Details Patient Name: Date of Service: Alex Jackson, [place] W. 06/06/2021 8:45 A M Medical Record Number: [id number] Patient Account Number: [id number] Date of Birth/Sex: Treating RN: [birth year] (75 y.o. Ernestene Mention Primary Care Provider: London Pepper Other Clinician: Referring Provider: Treating Provider/Extender: Merilyn Baba in Treatment: 1 Subjective Chief Complaint Information obtained from Patient Left great toe wound History of Present Illness (HPI) Admission 05/26/2021 Mr. Angelito Verdejo is a 76 year old male with a past medical history of controlled type 2 diabetes on oral agents, chronic back pain and coronary artery disease status post stenting to the LAD that presents to the clinic for a 68-month history of nonhealing wound to the  left great toe plantar aspect. He has peripheral neuropathy and is not sure how the wound developed. He has been following with Dr. Doran Durand for this issue and has been using Betadine to the area daily. He does not offload the area. He denies signs of infection. 2/3; patient presents for follow-up. He obtained his left foot x-ray. He has been using gentamicin with hydrofera blue toe to the wound bed with dressing changes. He has no issues or complaints today. He denies signs of infection. Patient History Information obtained from Patient. Family History Cancer - Father,Mother, Hypertension - Mother, No family history of Diabetes, Heart Disease, Hereditary Spherocytosis, Kidney Disease, Lung Disease, Seizures, Stroke, Thyroid Problems, Tuberculosis. Social History Former smoker - quit 1985, Alcohol Use - Daily - wine or scotch, Drug Use - No History, Caffeine Use - Never. Medical History Eyes Patient has history of Cataracts - removed Denies history of Glaucoma, Optic Neuritis Ear/Nose/Mouth/Throat Denies history  of Chronic sinus problems/congestion, Middle ear problems Hematologic/Lymphatic Denies history of Hemophilia, Human Immunodeficiency Virus, Lymphedema, Sickle Cell Disease Respiratory Denies history of Aspiration, Asthma, Chronic Obstructive Pulmonary Disease (COPD), Pneumothorax, Sleep Apnea, Tuberculosis Cardiovascular Patient has history of Coronary Artery Disease, Hypertension, Myocardial Infarction - NSTEMI Denies history of Angina, Arrhythmia, Congestive Heart Failure, Deep Vein Thrombosis, Hypotension, Peripheral Arterial Disease, Peripheral Venous Disease, Phlebitis, Vasculitis Gastrointestinal Denies history of Cirrhosis , Colitis, Crohnoos, Hepatitis A, Hepatitis B, Hepatitis C Endocrine Patient has history of Type II Diabetes - PCP notes type II; per patient takes metformin been told prediabetic Denies history of Type I Diabetes Genitourinary Denies history of End Stage Renal Disease Immunological Denies history of Lupus Erythematosus, Raynaudoos, Scleroderma Integumentary (Skin) Denies history of History of Burn Musculoskeletal Denies history of Gout, Rheumatoid Arthritis, Osteoarthritis, Osteomyelitis Neurologic Denies history of Dementia, Neuropathy, Quadriplegia, Paraplegia, Seizure Disorder Oncologic Denies history of Received Chemotherapy, Received Radiation Psychiatric Denies history of Anorexia/bulimia, Confinement Anxiety Hospitalization/Surgery History - stents MRI 2017and2019. - torn meniscus left 5 weeks ago. Medical A Surgical History Notes nd Constitutional Symptoms (General Health) Back pain x6 months Cardiovascular hyperlipidemia Genitourinary Elevated PSA Objective Constitutional respirations regular, non-labored and within target range for patient.. Vitals Time Taken: 8:54 AM, Height: 71 in, Source: Stated, Weight: 252 lbs, Source: Stated, BMI: 35.1, Temperature: 97.7 F, Pulse: 75 bpm, Respiratory Rate: 20 breaths/min, Blood Pressure: 150/87  mmHg. Cardiovascular 2+ dorsalis pedis/posterior tibialis pulses. Psychiatric pleasant and cooperative. General Notes: Left great toe: T the plantar aspect there is an open wound with granulation tissue and circumferential callus with undermining. No surrounding o signs of infection. Integumentary (Hair, Skin) Wound #1 status is Open. Original cause of wound was Blister. The date acquired was: 10/02/2020. The wound has been in treatment 1 weeks. The wound is located on the Left T Great. The wound measures 0.6cm length x 0.5cm width x 0.4cm depth; 0.236cm^2 area and 0.094cm^3 volume. There is Fat Layer oe (Subcutaneous Tissue) exposed. There is undermining starting at 12:00 and ending at 12:00 with a maximum distance of 0.4cm. There is a medium amount of serosanguineous drainage noted. The wound margin is distinct with the outline attached to the wound base. There is large (67-100%) red granulation within the wound bed. There is no necrotic tissue within the wound bed. Assessment Active Problems ICD-10 Non-pressure chronic ulcer of other part of left foot with fat layer exposed Type 2 diabetes mellitus with foot ulcer Presence of coronary angioplasty implant and graft Hyperlipidemia, unspecified Low back pain, unspecified Patient's wound is stable. I did  a wound culture at last clinic visit that showed E. coli and alcaligenes that was sensitive to gentamicin. He started the ointment this week. There is no surrounding soft tissue infection and so I do not think he needs systemic antibiotics. I also recommended an x-ray and this showed probable erosion to the distal cortex of the distal phalanx. This is concerning for osteo. We will obtain an MRI. Plan Follow-up Appointments: Return Appointment in 2 weeks. - Dr. Heber Mendon Other: - [employer]- wound care supplier Bathing/ Shower/ Hygiene: May shower and wash wound with soap and water. - with dressing changes. Edema Control -  Lymphedema / SCD / Other: Elevate legs to the level of the heart or above for 30 minutes daily and/or when sitting, a frequency of: - 3-4 times a day throughout the day. Avoid standing for long periods of time. Moisturize legs daily. - every night before bed. Radiology ordered were: MRI, lower extremity with/without contrast left foot - diabetic foot ulcer left great toe, R/O osteomyelitis WOUND #1: - T Great Wound Laterality: Left oe Cleanser: Soap and Water 1 x Per Day/30 Days Discharge Instructions: May shower and wash wound with dial antibacterial soap and water prior to dressing change. Topical: Gentamicin 1 x Per Day/30 Days Discharge Instructions: As directed by physician Prim Dressing: Hydrofera Blue Ready Foam, 2.5 x2.5 in 1 x Per Day/30 Days ary Discharge Instructions: Apply to wound bed as instructed Secondary Dressing: Woven Gauze Sponges 2x2 in 1 x Per Day/30 Days Discharge Instructions: Apply over primary dressing and under the toe rolled gauze as directed. Secondary Dressing: Optifoam Non-Adhesive Dressing, 4x4 in 1 x Per Day/30 Days Discharge Instructions: Apply over primary dressing cut to make foam donut Secured With: Conforming Stretch Gauze Bandage, Sterile 2x75 (in/in) 1 x Per Day/30 Days Discharge Instructions: Secure with stretch gauze as directed. Secured With: 55M Medipore H Soft Cloth Surgical T ape, 4 x 10 (in/yd) 1 x Per Day/30 Days Discharge Instructions: Secure with tape as directed. 1. Gentamicin with Hydrofera Blue 2. Aggressive offloading 3. MRI of the left great toe 4. Follow-up in 2 weeks Electronic Signature(s) Signed: 06/06/2021 10:31:24 AM By: Kalman Shan DO Entered By: Kalman Shan on 06/06/2021 10:29:26 -------------------------------------------------------------------------------- HxROS Details Patient Name: Date of Service: Alex Jackson, [place] W. 06/06/2021 8:45 A M Medical Record Number: [id number] Patient Account Number:  [id number] Date of Birth/Sex: Treating RN: 1945-07-13 (75 y.o. Ernestene Mention Primary Care Provider: London Pepper Other Clinician: Referring Provider: Treating Provider/Extender: Merilyn Baba in Treatment: 1 Information Obtained From Patient Constitutional Symptoms (General Health) Medical History: Past Medical History Notes: Back pain x6 months Eyes Medical History: Positive for: Cataracts - removed Negative for: Glaucoma; Optic Neuritis Ear/Nose/Mouth/Throat Medical History: Negative for: Chronic sinus problems/congestion; Middle ear problems Hematologic/Lymphatic Medical History: Negative for: Hemophilia; Human Immunodeficiency Virus; Lymphedema; Sickle Cell Disease Respiratory Medical History: Negative for: Aspiration; Asthma; Chronic Obstructive Pulmonary Disease (COPD); Pneumothorax; Sleep Apnea; Tuberculosis Cardiovascular Medical History: Positive for: Coronary Artery Disease; Hypertension; Myocardial Infarction - NSTEMI Negative for: Angina; Arrhythmia; Congestive Heart Failure; Deep Vein Thrombosis; Hypotension; Peripheral Arterial Disease; Peripheral Venous Disease; Phlebitis; Vasculitis Past Medical History Notes: hyperlipidemia Gastrointestinal Medical History: Negative for: Cirrhosis ; Colitis; Crohns; Hepatitis A; Hepatitis B; Hepatitis C Endocrine Medical History: Positive for: Type II Diabetes - PCP notes type II; per patient takes metformin been told prediabetic Negative for: Type I Diabetes Time with diabetes: prediabetic 3-4 years per pt Treated with: Oral agents, Diet Blood sugar tested  every day: No Genitourinary Medical History: Negative for: End Stage Renal Disease Past Medical History Notes: Elevated PSA Immunological Medical History: Negative for: Lupus Erythematosus; Raynauds; Scleroderma Integumentary (Skin) Medical History: Negative for: History of Burn Musculoskeletal Medical History: Negative for:  Gout; Rheumatoid Arthritis; Osteoarthritis; Osteomyelitis Neurologic Medical History: Negative for: Dementia; Neuropathy; Quadriplegia; Paraplegia; Seizure Disorder Oncologic Medical History: Negative for: Received Chemotherapy; Received Radiation Psychiatric Medical History: Negative for: Anorexia/bulimia; Confinement Anxiety HBO Extended History Items Eyes: Cataracts Immunizations Pneumococcal Vaccine: Received Pneumococcal Vaccination: Yes Received Pneumococcal Vaccination On or After 60th Birthday: Yes Implantable Devices No devices added Hospitalization / Surgery History Type of Hospitalization/Surgery stents MRI 2017and2019 torn meniscus left 5 weeks ago Family and Social History Cancer: Yes - Father,Mother; Diabetes: No; Heart Disease: No; Hereditary Spherocytosis: No; Hypertension: Yes - Mother; Kidney Disease: No; Lung Disease: No; Seizures: No; Stroke: No; Thyroid Problems: No; Tuberculosis: No; Former smoker - quit 1985; Alcohol Use: Daily - wine or scotch; Drug Use: No History; Caffeine Use: Never; Financial Concerns: No; Food, Clothing or Shelter Needs: No; Support System Lacking: No; Transportation Concerns: No Electronic Signature(s) Signed: 06/06/2021 10:31:24 AM By: Kalman Shan DO Signed: 06/09/2021 4:37:55 PM By: Baruch Gouty RN, BSN Entered By: Kalman Shan on 06/06/2021 10:23:06 -------------------------------------------------------------------------------- [place] Details Patient Name: Date of Service: Alex Jackson. 06/06/2021 Medical Record Number: [id number] Patient Account Number: [id number] Date of Birth/Sex: Treating RN: [birth year] (75 y.o. Ernestene Mention Primary Care Provider: London Pepper Other Clinician: Referring Provider: Treating Provider/Extender: Merilyn Baba in Treatment: 1 Diagnosis Coding ICD-10 Codes Code Description [phone] Non-pressure chronic ulcer of other part of left foot with fat  layer exposed E11.621 Type 2 diabetes mellitus with foot ulcer Z95.5 Presence of coronary angioplasty implant and graft E78.5 Hyperlipidemia, unspecified M54.50 Low back pain, unspecified Facility Procedures CPT4 Code: [id number] Description: 99213 - WOUND CARE VISIT-LEV 3 EST PT Modifier: Quantity: 1 Physician Procedures : CPT4 Code Description Modifier [id number] - WC PHYS LEVEL 3 - EST PT ICD-10 Diagnosis Description L97.522 Non-pressure chronic ulcer of other part of left foot with fat layer exposed E11.621 Type 2 diabetes mellitus with foot ulcer Z95.5 Presence of  coronary angioplasty implant and graft E78.5 Hyperlipidemia, unspecified Quantity: 1 Electronic Signature(s) Signed: 06/06/2021 10:31:24 AM By: Kalman Shan DO Entered By: Kalman Shan on 06/06/2021 10:29:52

## 2021-06-11 DIAGNOSIS — M25652 Stiffness of left hip, not elsewhere classified: Secondary | ICD-10-CM | POA: Diagnosis not present

## 2021-06-11 DIAGNOSIS — R262 Difficulty in walking, not elsewhere classified: Secondary | ICD-10-CM | POA: Diagnosis not present

## 2021-06-11 DIAGNOSIS — M25651 Stiffness of right hip, not elsewhere classified: Secondary | ICD-10-CM | POA: Diagnosis not present

## 2021-06-11 DIAGNOSIS — M5117 Intervertebral disc disorders with radiculopathy, lumbosacral region: Secondary | ICD-10-CM | POA: Diagnosis not present

## 2021-06-12 ENCOUNTER — Other Ambulatory Visit: Payer: Self-pay

## 2021-06-12 MED ORDER — LISINOPRIL 40 MG PO TABS: 40.0000 mg | ORAL_TABLET | Freq: Every day | ORAL | 0 refills | Status: DC

## 2021-06-12 MED ORDER — CHLORTHALIDONE 25 MG PO TABS: 25.0000 mg | ORAL_TABLET | Freq: Every day | ORAL | 0 refills | Status: DC

## 2021-06-12 NOTE — Addendum Note (Signed)
Addended by: Margaret Pyle D on: 06/12/2021 08:14 AM   Modules accepted: Orders

## 2021-06-13 DIAGNOSIS — M5117 Intervertebral disc disorders with radiculopathy, lumbosacral region: Secondary | ICD-10-CM | POA: Diagnosis not present

## 2021-06-13 DIAGNOSIS — R262 Difficulty in walking, not elsewhere classified: Secondary | ICD-10-CM | POA: Diagnosis not present

## 2021-06-13 DIAGNOSIS — M25651 Stiffness of right hip, not elsewhere classified: Secondary | ICD-10-CM | POA: Diagnosis not present

## 2021-06-13 DIAGNOSIS — M25652 Stiffness of left hip, not elsewhere classified: Secondary | ICD-10-CM | POA: Diagnosis not present

## 2021-06-14 ENCOUNTER — Ambulatory Visit (HOSPITAL_COMMUNITY)
Admission: RE | Admit: 2021-06-14 | Discharge: 2021-06-14 | Disposition: A | Discharge status: Home / Self Care | Payer: PPO | Source: Ambulatory Visit | Attending: Internal Medicine | Admitting: Internal Medicine

## 2021-06-14 DIAGNOSIS — L97529 Non-pressure chronic ulcer of other part of left foot with unspecified severity: Secondary | ICD-10-CM | POA: Insufficient documentation

## 2021-06-14 DIAGNOSIS — E08621 Diabetes mellitus due to underlying condition with foot ulcer: Secondary | ICD-10-CM

## 2021-06-14 DIAGNOSIS — R600 Localized edema: Secondary | ICD-10-CM | POA: Diagnosis not present

## 2021-06-14 DIAGNOSIS — E11621 Type 2 diabetes mellitus with foot ulcer: Secondary | ICD-10-CM | POA: Diagnosis not present

## 2021-06-14 DIAGNOSIS — L97509 Non-pressure chronic ulcer of other part of unspecified foot with unspecified severity: Secondary | ICD-10-CM | POA: Diagnosis not present

## 2021-06-14 MED ORDER — GADOBUTROL 1 MMOL/ML IV SOLN
10.0000 mL | Freq: Once | INTRAVENOUS | Status: AC | PRN
  Administered 2021-06-14: 10 mL via INTRAVENOUS

## 2021-06-17 DIAGNOSIS — M5117 Intervertebral disc disorders with radiculopathy, lumbosacral region: Secondary | ICD-10-CM | POA: Diagnosis not present

## 2021-06-17 DIAGNOSIS — M25652 Stiffness of left hip, not elsewhere classified: Secondary | ICD-10-CM | POA: Diagnosis not present

## 2021-06-17 DIAGNOSIS — M25651 Stiffness of right hip, not elsewhere classified: Secondary | ICD-10-CM | POA: Diagnosis not present

## 2021-06-17 DIAGNOSIS — R262 Difficulty in walking, not elsewhere classified: Secondary | ICD-10-CM | POA: Diagnosis not present

## 2021-06-20 ENCOUNTER — Encounter (HOSPITAL_BASED_OUTPATIENT_CLINIC_OR_DEPARTMENT_OTHER): Payer: PPO | Admitting: Internal Medicine

## 2021-06-20 ENCOUNTER — Other Ambulatory Visit: Payer: Self-pay

## 2021-06-20 DIAGNOSIS — M545 Low back pain, unspecified: Secondary | ICD-10-CM | POA: Diagnosis not present

## 2021-06-20 DIAGNOSIS — E11621 Type 2 diabetes mellitus with foot ulcer: Secondary | ICD-10-CM | POA: Diagnosis not present

## 2021-06-20 DIAGNOSIS — L97522 Non-pressure chronic ulcer of other part of left foot with fat layer exposed: Secondary | ICD-10-CM | POA: Diagnosis not present

## 2021-06-20 DIAGNOSIS — Z955 Presence of coronary angioplasty implant and graft: Secondary | ICD-10-CM | POA: Diagnosis not present

## 2021-06-20 DIAGNOSIS — R262 Difficulty in walking, not elsewhere classified: Secondary | ICD-10-CM | POA: Diagnosis not present

## 2021-06-20 DIAGNOSIS — M25651 Stiffness of right hip, not elsewhere classified: Secondary | ICD-10-CM | POA: Diagnosis not present

## 2021-06-20 DIAGNOSIS — M25652 Stiffness of left hip, not elsewhere classified: Secondary | ICD-10-CM | POA: Diagnosis not present

## 2021-06-20 DIAGNOSIS — M5117 Intervertebral disc disorders with radiculopathy, lumbosacral region: Secondary | ICD-10-CM | POA: Diagnosis not present

## 2021-06-20 NOTE — Progress Notes (Signed)
DAMMON, STUEBER ([id number]) Visit Report for 06/20/2021 Chief Complaint Document Details Patient Name: Date of Service: Bufford Spikes. 06/20/2021 8:45 A M Medical Record Number: [id number] Patient Account Number: [id number] Date of Birth/Sex: Treating RN: Mar 25, 1946 (76 y.o. M) Primary Care Provider: London Pepper Other Clinician: Referring Provider: Treating Provider/Extender: Merilyn Baba in Treatment: 3 Information Obtained from: Patient Chief Complaint Left great toe wound Electronic Signature(s) Signed: 06/20/2021 10:42:31 AM By: Kalman Shan DO Entered By: Kalman Shan on 06/20/2021 10:37:37 -------------------------------------------------------------------------------- HPI Details Patient Name: Date of Service: [name], [name] 06/20/2021 8:45 A M Medical Record Number: [id number] Patient Account Number: [id number] Date of Birth/Sex: Treating RN: [birth year] (76 y.o. M) Primary Care Provider: London Pepper Other Clinician: Referring Provider: Treating Provider/Extender: Merilyn Baba in Treatment: 3 History of Present Illness HPI Description: Admission 05/26/2021 Mr. Gildardo Lardner is a 76 year old male with a past medical history of controlled type 2 diabetes on oral agents, chronic back pain and coronary artery disease status post stenting to the LAD that presents to the clinic for a 42-month history of nonhealing wound to the left great toe plantar aspect. He has peripheral neuropathy and is not sure how the wound developed. He has been following with Dr. Doran Durand for this issue and has been using Betadine to the area daily. He does not offload the area. He denies signs of infection. 2/3; patient presents for follow-up. He obtained his left foot x-ray. He has been using gentamicin with hydrofera blue toe to the wound bed with dressing changes. He has no issues or complaints today. He denies signs of  infection. 2/17; patient presents for follow-up. He had his MRI that showed bone marrow edema at the distal aspect concerning for osteomyelitis. Currently patient denies signs of infection. He feels well and has been using gentamicin with Hydrofera Blue to the wound bed. He states that he has ordered custom diabetic shoes. He obtains these at the end of the month. Electronic Signature(s) Signed: 06/20/2021 10:42:31 AM By: Kalman Shan DO Entered By: Kalman Shan on 06/20/2021 10:38:46 -------------------------------------------------------------------------------- Physical Exam Details Patient Name: Date of Service: [name] 06/20/2021 8:45 A M Medical Record Number: [id number] Patient Account Number: [id number] Date of Birth/Sex: Treating RN: 06/24/1945 (76 y.o. M) Primary Care Provider: London Pepper Other Clinician: Referring Provider: Treating Provider/Extender: Merilyn Baba in Treatment: 3 Constitutional respirations regular, non-labored and within target range for patient.. Cardiovascular 2+ dorsalis pedis/posterior tibialis pulses. Psychiatric pleasant and cooperative. Notes Left great toe: T the plantar aspect there is an open wound with granulation tissue and circumferential callus with undermining. No surrounding signs of o infection. Electronic Signature(s) Signed: 06/20/2021 10:42:31 AM By: Kalman Shan DO Entered By: Kalman Shan on 06/20/2021 10:39:19 -------------------------------------------------------------------------------- Physician Orders Details Patient Name: Date of Service: [name], [name] 06/20/2021 8:45 A M Medical Record Number: [id number] Patient Account Number: [id number] Date of Birth/Sex: Treating RN: 09-17-1945 (76 y.o. [name], [name] Primary Care Provider: London Pepper Other Clinician: Referring Provider: Treating Provider/Extender: Merilyn Baba in  Treatment: 3 Verbal / Phone Orders: No Diagnosis Coding Follow-up Appointments ppointment in 2 weeks. - Dr. Heber [place] Return A Other: - [employer]- wound care supplier Bathing/ Shower/ Hygiene May shower and wash wound with soap and water. - with dressing changes. Edema Control - Lymphedema / SCD / Other Elevate legs to the level of the heart or above for  30 minutes daily and/or when sitting, a frequency of: - 3-4 times a day throughout the day. Avoid standing for long periods of time. Moisturize legs daily. - every night before bed. Wound Treatment Wound #1 - T Great oe Wound Laterality: Left Cleanser: Soap and Water 1 x Per Day/30 Days Discharge Instructions: May shower and wash wound with dial antibacterial soap and water prior to dressing change. Topical: Gentamicin 1 x Per Day/30 Days Discharge Instructions: As directed by physician Prim Dressing: Hydrofera Blue Ready Foam, 2.5 x2.5 in 1 x Per Day/30 Days ary Discharge Instructions: Apply to wound bed as instructed Secondary Dressing: Woven Gauze Sponges 2x2 in 1 x Per Day/30 Days Discharge Instructions: Apply over primary dressing and under the toe rolled gauze as directed. Secondary Dressing: Optifoam Non-Adhesive Dressing, 4x4 in 1 x Per Day/30 Days Discharge Instructions: Apply over primary dressing cut to make foam donut Secured With: Conforming Stretch Gauze Bandage, Sterile 2x75 (in/in) 1 x Per Day/30 Days Discharge Instructions: Secure with stretch gauze as directed. Secured With: 8M Medipore H Soft Cloth Surgical T ape, 4 x 10 (in/yd) 1 x Per Day/30 Days Discharge Instructions: Secure with tape as directed. Consults Infectious Disease - Refer to Infectious Disease for +osteomyeltis to Left Great toe Electronic Signature(s) Signed: 06/20/2021 10:42:31 AM By: Kalman Shan DO Entered By: Kalman Shan on 06/20/2021 10:39:33 Prescription  06/20/2021 -------------------------------------------------------------------------------- Vinetta Bergamo. Kalman Shan DO Patient Name: Provider: [birth year] [id number] Date of Birth: NPI#: Jerilynn [id number] [id number] Sex: DEA #: [phone] Phone #: License #: [place] Patient Address: [address], [place] [address] [place], [place] [address] [phone] Allergies prednisone Provider's Orders Infectious Disease - Refer to Infectious Disease for +osteomyeltis to Left Great toe Hand Signature: Date(s): Electronic Signature(s) Signed: 06/20/2021 10:42:31 AM By: Kalman Shan DO Entered By: Kalman Shan on 06/20/2021 10:39:33 -------------------------------------------------------------------------------- Problem List Details Patient Name: Date of Service: [name], [name] 06/20/2021 8:45 A M Medical Record Number: [id number] Patient Account Number: [id number] Date of Birth/Sex: Treating RN: 24-Aug-1945 (76 y.o. M) Primary Care Provider: London Pepper Other Clinician: Referring Provider: Treating Provider/Extender: Merilyn Baba in Treatment: 3 Active Problems ICD-10 Encounter Code Description Active Date MDM Diagnosis [phone] Non-pressure chronic ulcer of other part of left foot with fat layer exposed 05/26/2021 No Yes E11.621 Type 2 diabetes mellitus with foot ulcer 05/26/2021 No Yes Z95.5 Presence of coronary angioplasty implant and graft 05/26/2021 No Yes E78.5 Hyperlipidemia, unspecified 05/26/2021 No Yes M54.50 Low back pain, unspecified 05/26/2021 No Yes Inactive Problems Resolved Problems Electronic Signature(s) Signed: 06/20/2021 10:42:31 AM By: Kalman Shan DO Entered By: Kalman Shan on 06/20/2021 10:28:16 -------------------------------------------------------------------------------- Progress Note Details Patient Name: Date of  Service: [name], [name] 06/20/2021 8:45 A M Medical Record Number: [id number] Patient Account Number: [id number] Date of Birth/Sex: Treating RN: [birth year] (76 y.o. M) Primary Care Provider: London Pepper Other Clinician: Referring Provider: Treating Provider/Extender: Merilyn Baba in Treatment: 3 Subjective Chief Complaint Information obtained from Patient Left great toe wound History of Present Illness (HPI) Admission 05/26/2021 Mr. Marko Mermelstein is a 77 year old male with a past medical history of controlled type 2 diabetes on oral agents, chronic back pain and coronary artery disease status post stenting to the LAD that presents to the clinic for a 60-month history of nonhealing wound to the left great toe plantar aspect. He has peripheral neuropathy and is not  sure how the wound developed. He has been following with Dr. Doran Durand for this issue and has been using Betadine to the area daily. He does not offload the area. He denies signs of infection. 2/3; patient presents for follow-up. He obtained his left foot x-ray. He has been using gentamicin with hydrofera blue toe to the wound bed with dressing changes. He has no issues or complaints today. He denies signs of infection. 2/17; patient presents for follow-up. He had his MRI that showed bone marrow edema at the distal aspect concerning for osteomyelitis. Currently patient denies signs of infection. He feels well and has been using gentamicin with Hydrofera Blue to the wound bed. He states that he has ordered custom diabetic shoes. He obtains these at the end of the month. Patient History Information obtained from Patient. Family History Cancer - Father,Mother, Hypertension - Mother, No family history of Diabetes, Heart Disease, Hereditary Spherocytosis, Kidney Disease, Lung Disease, Seizures, Stroke, Thyroid Problems, Tuberculosis. Social History Former smoker - quit 1985, Alcohol Use - Daily - wine  or scotch, Drug Use - No History, Caffeine Use - Never. Medical History Eyes Patient has history of Cataracts - removed Denies history of Glaucoma, Optic Neuritis Ear/Nose/Mouth/Throat Denies history of Chronic sinus problems/congestion, Middle ear problems Hematologic/Lymphatic Denies history of Hemophilia, Human Immunodeficiency Virus, Lymphedema, Sickle Cell Disease Respiratory Denies history of Aspiration, Asthma, Chronic Obstructive Pulmonary Disease (COPD), Pneumothorax, Sleep Apnea, Tuberculosis Cardiovascular Patient has history of Coronary Artery Disease, Hypertension, Myocardial Infarction - NSTEMI Denies history of Angina, Arrhythmia, Congestive Heart Failure, Deep Vein Thrombosis, Hypotension, Peripheral Arterial Disease, Peripheral Venous Disease, Phlebitis, Vasculitis Gastrointestinal Denies history of Cirrhosis , Colitis, Crohnoos, Hepatitis A, Hepatitis B, Hepatitis C Endocrine Patient has history of Type II Diabetes - PCP notes type II; per patient takes metformin been told prediabetic Denies history of Type I Diabetes Genitourinary Denies history of End Stage Renal Disease Immunological Denies history of Lupus Erythematosus, Raynaudoos, Scleroderma Integumentary (Skin) Denies history of History of Burn Musculoskeletal Denies history of Gout, Rheumatoid Arthritis, Osteoarthritis, Osteomyelitis Neurologic Denies history of Dementia, Neuropathy, Quadriplegia, Paraplegia, Seizure Disorder Oncologic Denies history of Received Chemotherapy, Received Radiation Psychiatric Denies history of Anorexia/bulimia, Confinement Anxiety Hospitalization/Surgery History - stents MRI 2017and2019. - torn meniscus left 5 weeks ago. Medical A Surgical History Notes nd Constitutional Symptoms (General Health) Back pain x6 months Cardiovascular hyperlipidemia Genitourinary Elevated PSA Objective Constitutional respirations regular, non-labored and within target range for  patient.. Vitals Time Taken: 8:52 AM, Height: 71 in, Weight: 252 lbs, BMI: 35.1, Temperature: 97.7 F, Pulse: 74 bpm, Respiratory Rate: 17 breaths/min, Blood Pressure: 172/89 mmHg. Cardiovascular 2+ dorsalis pedis/posterior tibialis pulses. Psychiatric pleasant and cooperative. General Notes: Left great toe: T the plantar aspect there is an open wound with granulation tissue and circumferential callus with undermining. No surrounding o signs of infection. Integumentary (Hair, Skin) Wound #1 status is Open. Original cause of wound was Blister. The date acquired was: 10/02/2020. The wound has been in treatment 3 weeks. The wound is located on the Left T Great. The wound measures 0.7cm length x 0.4cm width x 0.4cm depth; 0.22cm^2 area and 0.088cm^3 volume. There is Fat Layer oe (Subcutaneous Tissue) exposed. There is no tunneling noted, however, there is undermining starting at 11:00 and ending at 7:00 with a maximum distance of 0.5cm. There is a medium amount of serosanguineous drainage noted. The wound margin is distinct with the outline attached to the wound base. There is large (67-100%) red granulation within the wound bed. There is  no necrotic tissue within the wound bed. Assessment Active Problems ICD-10 Non-pressure chronic ulcer of other part of left foot with fat layer exposed Type 2 diabetes mellitus with foot ulcer Presence of coronary angioplasty implant and graft Hyperlipidemia, unspecified Low back pain, unspecified Patient's wound is stable. I recommended continuing Hydrofera Blue with gentamicin ointment. MRI showed some concern for osteomyelitis and I recommended a referral to infectious disease for recommendations for potential antibiotic course. I recommended aggressive offloading. We will add an offloading pad to the toe. He states he has custom orthotics that will arrive in 2 weeks. There were no signs of surrounding infection. Follow-up in 2 weeks. Plan Follow-up  Appointments: Return Appointment in 2 weeks. - Dr. Heber [place] Other: - [employer]- wound care supplier Bathing/ Shower/ Hygiene: May shower and wash wound with soap and water. - with dressing changes. Edema Control - Lymphedema / SCD / Other: Elevate legs to the level of the heart or above for 30 minutes daily and/or when sitting, a frequency of: - 3-4 times a day throughout the day. Avoid standing for long periods of time. Moisturize legs daily. - every night before bed. Consults ordered were: Infectious Disease - Refer to Infectious Disease for +osteomyeltis to Left Great toe WOUND #1: - T Great Wound Laterality: Left oe Cleanser: Soap and Water 1 x Per Day/30 Days Discharge Instructions: May shower and wash wound with dial antibacterial soap and water prior to dressing change. Topical: Gentamicin 1 x Per Day/30 Days Discharge Instructions: As directed by physician Prim Dressing: Hydrofera Blue Ready Foam, 2.5 x2.5 in 1 x Per Day/30 Days ary Discharge Instructions: Apply to wound bed as instructed Secondary Dressing: Woven Gauze Sponges 2x2 in 1 x Per Day/30 Days Discharge Instructions: Apply over primary dressing and under the toe rolled gauze as directed. Secondary Dressing: Optifoam Non-Adhesive Dressing, 4x4 in 1 x Per Day/30 Days Discharge Instructions: Apply over primary dressing cut to make foam donut Secured With: Conforming Stretch Gauze Bandage, Sterile 2x75 (in/in) 1 x Per Day/30 Days Discharge Instructions: Secure with stretch gauze as directed. Secured With: 106M Medipore H Soft Cloth Surgical T ape, 4 x 10 (in/yd) 1 x Per Day/30 Days Discharge Instructions: Secure with tape as directed. 1. Hydrofera Blue and gentamicin ointment 2. Aggressive offloading 3. Follow-up in 2 weeks 4. Referral to infectious disease for MRI results concerning for osteomyelitis of the left great toe Electronic Signature(s) Signed: 06/20/2021 10:42:31 AM By: Kalman Shan DO Entered By:  Kalman Shan on 06/20/2021 10:41:52 -------------------------------------------------------------------------------- HxROS Details Patient Name: Date of Service: [name], [name] 06/20/2021 8:45 A M Medical Record Number: [id number] Patient Account Number: [id number] Date of Birth/Sex: Treating RN: [birth year] (76 y.o. M) Primary Care Provider: London Pepper Other Clinician: Referring Provider: Treating Provider/Extender: Merilyn Baba in Treatment: 3 Information Obtained From Patient Constitutional Symptoms (General Health) Medical History: Past Medical History Notes: Back pain x6 months Eyes Medical History: Positive for: Cataracts - removed Negative for: Glaucoma; Optic Neuritis Ear/Nose/Mouth/Throat Medical History: Negative for: Chronic sinus problems/congestion; Middle ear problems Hematologic/Lymphatic Medical History: Negative for: Hemophilia; Human Immunodeficiency Virus; Lymphedema; Sickle Cell Disease Respiratory Medical History: Negative for: Aspiration; Asthma; Chronic Obstructive Pulmonary Disease (COPD); Pneumothorax; Sleep Apnea; Tuberculosis Cardiovascular Medical History: Positive for: Coronary Artery Disease; Hypertension; Myocardial Infarction - NSTEMI Negative for: Angina; Arrhythmia; Congestive Heart Failure; Deep Vein Thrombosis; Hypotension; Peripheral Arterial Disease; Peripheral Venous Disease; Phlebitis; Vasculitis Past Medical History Notes: hyperlipidemia Gastrointestinal Medical History: Negative for: Cirrhosis ; Colitis;  Crohns; Hepatitis A; Hepatitis B; Hepatitis C Endocrine Medical History: Positive for: Type II Diabetes - PCP notes type II; per patient takes metformin been told prediabetic Negative for: Type I Diabetes Time with diabetes: prediabetic 3-4 years per pt Treated with: Oral agents, Diet Blood sugar tested every day: No Genitourinary Medical History: Negative for: End Stage Renal Disease Past  Medical History Notes: Elevated PSA Immunological Medical History: Negative for: Lupus Erythematosus; Raynauds; Scleroderma Integumentary (Skin) Medical History: Negative for: History of Burn Musculoskeletal Medical History: Negative for: Gout; Rheumatoid Arthritis; Osteoarthritis; Osteomyelitis Neurologic Medical History: Negative for: Dementia; Neuropathy; Quadriplegia; Paraplegia; Seizure Disorder Oncologic Medical History: Negative for: Received Chemotherapy; Received Radiation Psychiatric Medical History: Negative for: Anorexia/bulimia; Confinement Anxiety HBO Extended History Items Eyes: Cataracts Immunizations Pneumococcal Vaccine: Received Pneumococcal Vaccination: Yes Received Pneumococcal Vaccination On or After 60th Birthday: Yes Implantable Devices No devices added Hospitalization / Surgery History Type of Hospitalization/Surgery stents MRI 2017and2019 torn meniscus left 5 weeks ago Family and Social History Cancer: Yes - Father,Mother; Diabetes: No; Heart Disease: No; Hereditary Spherocytosis: No; Hypertension: Yes - Mother; Kidney Disease: No; Lung Disease: No; Seizures: No; Stroke: No; Thyroid Problems: No; Tuberculosis: No; Former smoker - quit 1985; Alcohol Use: Daily - wine or scotch; Drug Use: No History; Caffeine Use: Never; Financial Concerns: No; Food, Clothing or Shelter Needs: No; Support System Lacking: No; Transportation Concerns: No Electronic Signature(s) Signed: 06/20/2021 10:42:31 AM By: Kalman Shan DO Entered By: Kalman Shan on 06/20/2021 10:38:55 -------------------------------------------------------------------------------- SuperBill Details Patient Name: Date of Service: [name], [name] 06/20/2021 Medical Record Number: [id number] Patient Account Number: [id number] Date of Birth/Sex: Treating RN: 1946/01/20 (76 y.o. [name], [name] Primary Care Provider: London Pepper Other Clinician: Referring Provider: Treating  Provider/Extender: Merilyn Baba in Treatment: 3 Diagnosis Coding ICD-10 Codes Code Description [phone] Non-pressure chronic ulcer of other part of left foot with fat layer exposed E11.621 Type 2 diabetes mellitus with foot ulcer Z95.5 Presence of coronary angioplasty implant and graft E78.5 Hyperlipidemia, unspecified M54.50 Low back pain, unspecified Facility Procedures CPT4 Code: [id number] Description: 99213 - WOUND CARE VISIT-LEV 3 EST PT Modifier: Quantity: 1 Physician Procedures : CPT4 Code Description Modifier [id number] - WC PHYS LEVEL 3 - EST PT ICD-10 Diagnosis Description L97.522 Non-pressure chronic ulcer of other part of left foot with fat layer exposed E11.621 Type 2 diabetes mellitus with foot ulcer Z95.5 Presence of  coronary angioplasty implant and graft M54.50 Low back pain, unspecified Quantity: 1 Electronic Signature(s) Signed: 06/20/2021 10:42:31 AM By: Kalman Shan DO Entered By: Kalman Shan on 06/20/2021 10:42:08

## 2021-06-20 NOTE — Progress Notes (Signed)
KERBY, BORNER ([id number]) Visit Report for 06/20/2021 Arrival Information Details Patient Name: Date of Service: Alex Jackson. 06/20/2021 8:45 A M Medical Record Number: [id number] Patient Account Number: [id number] Date of Birth/Sex: Treating RN: [birth year] (76 y.o. [name], [name] Primary Care Alexea Blase: London Pepper Other Clinician: Referring Dion Sibal: Treating Anais Denslow/Extender: Merilyn Baba in Treatment: 3 Visit Information History Since Last Visit Added or deleted any medications: No Patient Arrived: [name] Any new allergies or adverse reactions: No Arrival Time: 08:46 Had a fall or experienced change in No Accompanied By: self activities of daily living that may affect Transfer Assistance: Manual risk of falls: Patient Identification Verified: Yes Signs or symptoms of abuse/neglect since last visito No Secondary Verification Process Completed: Yes Hospitalized since last visit: No Patient Requires Transmission-Based Precautions: No Implantable device outside of the clinic excluding No Patient Has Alerts: Yes cellular tissue based products placed in the center Patient Alerts: 11/18/20 ABI L1.04 since last visit: 11/18/20 TBI L 1.35 Has Dressing in Place as Prescribed: Yes Pain Present Now: No Electronic Signature(s) Signed: 06/20/2021 12:52:20 PM By: Rhae Hammock RN Entered By: Rhae Hammock on 06/20/2021 08:52:54 -------------------------------------------------------------------------------- Clinic Level of Care Assessment Details Patient Name: Date of Service: [name] 06/20/2021 8:45 A M Medical Record Number: [id number] Patient Account Number: [id number] Date of Birth/Sex: Treating RN: 1946-03-01 (76 y.o. [name], [name] Primary Care Jontavia Leatherbury: London Pepper Other Clinician: Referring Jhane Lorio: Treating Anairis Knick/Extender: Merilyn Baba in Treatment: 3 Clinic Level of Care  Assessment Items TOOL 4 Quantity Score X- 1 0 Use when only an EandM is performed on FOLLOW-UP visit ASSESSMENTS - Nursing Assessment / Reassessment X- 1 10 Reassessment of Co-morbidities (includes updates in patient status) X- 1 5 Reassessment of Adherence to Treatment Plan ASSESSMENTS - Wound and Skin A ssessment / Reassessment X - Simple Wound Assessment / Reassessment - one wound 1 5 _0  - 0 Complex Wound Assessment / Reassessment - multiple wounds _1  - 0 Dermatologic / Skin Assessment (not related to wound area) ASSESSMENTS - Focused Assessment X- 1 5 Circumferential Edema Measurements - multi extremities _2  - 0 Nutritional Assessment / Counseling / Intervention _3  - 0 Lower Extremity Assessment (monofilament, tuning fork, pulses) _4  - 0 Peripheral Arterial Disease Assessment (using hand held doppler) ASSESSMENTS - Ostomy and/or Continence Assessment and Care _5  - 0 Incontinence Assessment and Management _6  - 0 Ostomy Care Assessment and Management (repouching, etc.) PROCESS - Coordination of Care X - Simple Patient / Family Education for ongoing care 1 15 _7  - 0 Complex (extensive) Patient / Family Education for ongoing care X- 1 10 Staff obtains Programmer, systems, Records, T Results / Process Orders est _8  - 0 Staff telephones HHA, Nursing Homes / Clarify orders / etc _9  - 0 Routine Transfer to another Facility (non-emergent condition) _10  - 0 Routine Hospital Admission (non-emergent condition) _11  - 0 New Admissions / Biomedical engineer / Ordering NPWT Apligraf, etc. , _12  - 0 Emergency Hospital Admission (emergent condition) X- 1 10 Simple Discharge Coordination _13  - 0 Complex (extensive) Discharge Coordination PROCESS - Special Needs _14  - 0 Pediatric / Minor Patient Management _15  - 0 Isolation Patient Management _16  - 0 Hearing / Language / Visual special needs _17  - 0 Assessment of Community assistance (transportation, D/C planning, etc.) _18  -  0 Additional assistance / Altered mentation _19  - 0 Support Surface(s) Assessment (bed, cushion, seat, etc.) INTERVENTIONS - Wound Cleansing / Measurement X - Simple Wound Cleansing -  one wound 1 5 _0  - 0 Complex Wound Cleansing - multiple wounds X- 1 5 Wound Imaging (photographs - any number of wounds) _1  - 0 Wound Tracing (instead of photographs) X- 1 5 Simple Wound Measurement - one wound _2  - 0 Complex Wound Measurement - multiple wounds INTERVENTIONS - Wound Dressings X - Small Wound Dressing one or multiple wounds 1 10 _3  - 0 Medium Wound Dressing one or multiple wounds _4  - 0 Large Wound Dressing one or multiple wounds X- 1 5 Application of Medications - topical <[id number]>_7<\/[id number]>_5  - 0 Application of Medications - injection INTERVENTIONS - Miscellaneous _6  - 0 External ear exam _7  - 0 Specimen Collection (cultures, biopsies, blood, body fluids, etc.) _8  - 0 Specimen(s) / Culture(s) sent or taken to Lab for analysis _9  - 0 Patient Transfer (multiple staff / Civil Service fast streamer / Similar devices) _10  - 0 Simple Staple / Suture removal (25 or less) _11  - 0 Complex Staple / Suture removal (26 or more) _12  - 0 Hypo / Hyperglycemic Management (close monitor of Blood Glucose) _13  - 0 Ankle / Brachial Index (ABI) - do not check if billed separately X- 1 5 Vital Signs Has the patient been seen at the hospital within the last three years: Yes Total Score: 95 Level Of Care: New/Established - Level 3 Electronic Signature(s) Signed: 06/20/2021 12:52:20 PM By: Rhae Hammock RN Entered By: Rhae Hammock on 06/20/2021 09:12:24 -------------------------------------------------------------------------------- Encounter Discharge Information Details Patient Name: Date of Service: [name], [name] 06/20/2021 8:45 A M Medical Record Number: [id number] Patient Account Number: [id number] Date of Birth/Sex: Treating RN: 1945/09/21 (76 y.o. [name], [name] Primary Care Jerita Wimbush: London Pepper  Other Clinician: Referring Aleeya Veitch: Treating Deshondra Worst/Extender: Merilyn Baba in Treatment: 3 Encounter Discharge Information Items Discharge Condition: Stable Ambulatory Status: Ambulatory Discharge Destination: Home Transportation: Private Auto Accompanied By: self Schedule Follow-up Appointment: Yes Clinical Summary of Care: Patient Declined Electronic Signature(s) Signed: 06/20/2021 12:52:20 PM By: Rhae Hammock RN Entered By: Rhae Hammock on 06/20/2021 09:13:06 -------------------------------------------------------------------------------- Lower Extremity Assessment Details Patient Name: Date of Service: [name] 06/20/2021 8:45 A M Medical Record Number: [id number] Patient Account Number: [id number] Date of Birth/Sex: Treating RN: 03-19-1946 (76 y.o. [name], [name] Primary Care Amadi Frady: London Pepper Other Clinician: Referring Nakisha Chai: Treating Kha Hari/Extender: Merilyn Baba in Treatment: 3 Edema Assessment Assessed: Shirlyn Goltz: Yes] Patrice Paradise: No] Edema: [Left: N] [Right: o] Calf Left: Right: Point of Measurement: 36 cm From Medial Instep 39.5 cm Ankle Left: Right: Point of Measurement: 12 cm From Medial Instep 26.5 cm Vascular Assessment Pulses: Dorsalis Pedis Palpable: [Left:Yes] Posterior Tibial Palpable: [Left:Yes] Electronic Signature(s) Signed: 06/20/2021 12:52:20 PM By: Rhae Hammock RN Entered By: Rhae Hammock on 06/20/2021 08:57:52 -------------------------------------------------------------------------------- Multi Wound Chart Details Patient Name: Date of Service: [name], [name] 06/20/2021 8:45 A M Medical Record Number: [id number] Patient Account Number: [id number] Date of Birth/Sex: Treating RN: 31-Mar-1946 (76 y.o. M) Primary Care Meisha Salone: London Pepper Other Clinician: Referring Dunbar Buras: Treating Psalm Arman/Extender: Merilyn Baba  in Treatment: 3 Vital Signs Height(in): 7 Pulse(bpm): 89 Weight(lbs): 252 Blood Pressure(mmHg): 172/89 Body Mass Index(BMI): 35.1 Temperature(F): 97.7 Respiratory Rate(breaths/min): 17 Photos: [N/A:N/A] Left T Great oe N/A N/A Wound Location: Blister N/A N/A Wounding Event: Diabetic Wound/Ulcer of the Lower N/A N/A Primary Etiology: Extremity Cataracts, Coronary Artery Disease, N/A N/A Comorbid History: Hypertension, Myocardial Infarction, Type II Diabetes 10/02/2020 N/A N/A Date Acquired: 3 N/A N/A Weeks of Treatment: Open N/A  N/A Wound Status: No N/A N/A Wound Recurrence: 0.7x0.4x0.4 N/A N/A Measurements L x W x D (cm) 0.22 N/A N/A A (cm) : rea 0.088 N/A N/A Volume (cm) : 22.30% N/A N/A % Reduction in A rea: 22.10% N/A N/A % Reduction in Volume: 11 Starting Position 1 (o'clock): 7 Ending Position 1 (o'clock): 0.5 Maximum Distance 1 (cm): Yes N/A N/A Undermining: Grade 3 N/A N/A Classification: Medium N/A N/A Exudate A mount: Serosanguineous N/A N/A Exudate Type: red, brown N/A N/A Exudate Color: Distinct, outline attached N/A N/A Wound Margin: Large (67-100%) N/A N/A Granulation A mount: Red N/A N/A Granulation Quality: None Present (0%) N/A N/A Necrotic A mount: Fat Layer (Subcutaneous Tissue): Yes N/A N/A Exposed Structures: Fascia: No Tendon: No Muscle: No Joint: No Bone: No None N/A N/A Epithelialization: Treatment Notes Wound #1 (Toe Great) Wound Laterality: Left Cleanser Soap and Water Discharge Instruction: May shower and wash wound with dial antibacterial soap and water prior to dressing change. Peri-Wound Care Topical Gentamicin Discharge Instruction: As directed by physician Primary Dressing Hydrofera Blue Ready Foam, 2.5 x2.5 in Discharge Instruction: Apply to wound bed as instructed Secondary Dressing Woven Gauze Sponges 2x2 in Discharge Instruction: Apply over primary dressing and under the toe rolled gauze as  directed. Optifoam Non-Adhesive Dressing, 4x4 in Discharge Instruction: Apply over primary dressing cut to make foam donut Secured With Conforming Stretch Gauze Bandage, Sterile 2x75 (in/in) Discharge Instruction: Secure with stretch gauze as directed. 24M Medipore H Soft Cloth Surgical T ape, 4 x 10 (in/yd) Discharge Instruction: Secure with tape as directed. Compression Wrap Compression Stockings Add-Ons Electronic Signature(s) Signed: 06/20/2021 10:42:31 AM By: Kalman Shan DO Entered By: Kalman Shan on 06/20/2021 10:28:48 -------------------------------------------------------------------------------- Multi-Disciplinary Care Plan Details Patient Name: Date of Service: [name], [name] 06/20/2021 8:45 A M Medical Record Number: [id number] Patient Account Number: [id number] Date of Birth/Sex: Treating RN: 28-Sep-1945 (76 y.o. [name], [name] Primary Care Ameshia Pewitt: London Pepper Other Clinician: Referring Jezlyn Westerfield: Treating Laketha Leopard/Extender: Merilyn Baba in Treatment: 3 Multidisciplinary Care Plan reviewed with physician Active Inactive Nutrition Nursing Diagnoses: Potential for alteratiion in Nutrition/Potential for imbalanced nutrition Goals: Patient/caregiver agrees to and verbalizes understanding of need to obtain nutritional consultation Date Initiated: 05/26/2021 Date Inactivated: 06/06/2021 Target Resolution Date: 06/06/2021 Goal Status: Met Patient/caregiver will maintain therapeutic glucose control Date Initiated: 06/06/2021 Target Resolution Date: 07/04/2021 Goal Status: Active Interventions: Assess patient nutrition upon admission and as needed per policy Provide education on nutrition Treatment Activities: Education provided on Nutrition : 05/26/2021 Obtain HgA1c : 05/26/2021 Patient referred to Primary Care Physician for further nutritional evaluation : 05/26/2021 Notes: Wound/Skin Impairment Nursing Diagnoses: Knowledge  deficit related to ulceration/compromised skin integrity Goals: Patient/caregiver will verbalize understanding of skin care regimen Date Initiated: 05/26/2021 Target Resolution Date: 07/04/2021 Goal Status: Active Ulcer/skin breakdown will have a volume reduction of 50% by week 8 Date Initiated: 06/06/2021 Target Resolution Date: 07/04/2021 Goal Status: Active Interventions: Assess patient/caregiver ability to perform ulcer/skin care regimen upon admission and as needed Assess ulceration(s) every visit Provide education on ulcer and skin care Treatment Activities: Skin care regimen initiated : 05/26/2021 Topical wound management initiated : 05/26/2021 Notes: Electronic Signature(s) Signed: 06/20/2021 12:52:20 PM By: Rhae Hammock RN Entered By: Rhae Hammock on 06/20/2021 09:10:03 -------------------------------------------------------------------------------- Pain Assessment Details Patient Name: Date of Service: [name], [name] 06/20/2021 8:45 A M Medical Record Number: [id number] Patient Account Number: [id number] Date of Birth/Sex: Treating RN: 1945-06-26 (76 y.o. [name] Primary Care Chelcee Korpi:  London Pepper Other Clinician: Referring Temeka Pore: Treating Anay Rathe/Extender: Merilyn Baba in Treatment: 3 Active Problems Location of Pain Severity and Description of Pain Patient Has Paino No Site Locations Pain Management and Medication Current Pain Management: Electronic Signature(s) Signed: 06/20/2021 12:52:20 PM By: Rhae Hammock RN Entered By: Rhae Hammock on 06/20/2021 08:56:30 -------------------------------------------------------------------------------- Patient/Caregiver Education Details Patient Name: Date of Service: Alex Jackson 2/17/2023andnbsp8:45 [place] Record Number: [id number] Patient Account Number: [id number] Date of Birth/Gender: Treating RN: 22-May-1945 (76 y.o. [name],  [place] Primary Care Physician: London Pepper Other Clinician: Referring Physician: Treating Physician/Extender: Merilyn Baba in Treatment: 3 Education Assessment Education Provided To: Patient Education Topics Provided Basic Hygiene: Methods: Explain/Verbal Responses: Reinforcements needed, State content correctly Nutrition: Methods: Explain/Verbal Responses: Reinforcements needed, State content correctly Wound/Skin Impairment: Methods: Explain/Verbal Responses: Reinforcements needed, State content correctly Electronic Signature(s) Signed: 06/20/2021 12:52:20 PM By: Rhae Hammock RN Entered By: Rhae Hammock on 06/20/2021 09:10:45 -------------------------------------------------------------------------------- Wound Assessment Details Patient Name: Date of Service: [name], [name] 06/20/2021 8:45 A M Medical Record Number: [id number] Patient Account Number: [id number] Date of Birth/Sex: Treating RN: 10/19/1945 (76 y.o. [name], [name] Primary Care Shalayne Leach: [place] Pepper Other Clinician: Referring Anshika Pethtel: Treating Meganne Rita/Extender: Merilyn Baba in Treatment: 3 Wound Status Wound Number: 1 Primary Diabetic Wound/Ulcer of the Lower Extremity Etiology: Wound Location: Left T Great oe Wound Open Wounding Event: Blister Status: Date Acquired: 10/02/2020 Comorbid Cataracts, Coronary Artery Disease, Hypertension, Myocardial Weeks Of Treatment: 3 History: Infarction, Type II Diabetes Clustered Wound: No Photos Wound Measurements Length: (cm) 0.7 Width: (cm) 0.4 Depth: (cm) 0.4 Area: (cm) 0.22 Volume: (cm) 0.088 % Reduction in Area: 22.3% % Reduction in Volume: 22.1% Epithelialization: None Tunneling: No Undermining: Yes Starting Position (o'clock): 11 Ending Position (o'clock): 7 Maximum Distance: (cm) 0.5 Wound Description Classification: Grade 3 Wound Margin: Distinct, outline  attached Exudate Amount: Medium Exudate Type: Serosanguineous Exudate Color: red, brown Foul Odor After Cleansing: No Slough/Fibrino Yes Wound Bed Granulation Amount: Large (67-100%) Exposed Structure Granulation Quality: Red Fascia Exposed: No Necrotic Amount: None Present (0%) Fat Layer (Subcutaneous Tissue) Exposed: Yes Tendon Exposed: No Muscle Exposed: No Joint Exposed: No Bone Exposed: No Treatment Notes Wound #1 (Toe Great) Wound Laterality: Left Cleanser Soap and Water Discharge Instruction: May shower and wash wound with dial antibacterial soap and water prior to dressing change. Peri-Wound Care Topical Gentamicin Discharge Instruction: As directed by physician Primary Dressing Hydrofera Blue Ready Foam, 2.5 x2.5 in Discharge Instruction: Apply to wound bed as instructed Secondary Dressing Woven Gauze Sponges 2x2 in Discharge Instruction: Apply over primary dressing and under the toe rolled gauze as directed. Optifoam Non-Adhesive Dressing, 4x4 in Discharge Instruction: Apply over primary dressing cut to make foam donut Secured With Conforming Stretch Gauze Bandage, Sterile 2x75 (in/in) Discharge Instruction: Secure with stretch gauze as directed. 39M Medipore H Soft Cloth Surgical T ape, 4 x 10 (in/yd) Discharge Instruction: Secure with tape as directed. Compression Wrap Compression Stockings Add-Ons Electronic Signature(s) Signed: 06/20/2021 12:52:20 PM By: Rhae Hammock RN Signed: 06/20/2021 12:57:37 PM By: Deon Pilling RN, BSN Entered By: Deon Pilling on 06/20/2021 09:04:59 -------------------------------------------------------------------------------- Vitals Details Patient Name: Date of Service: [name], [name] 06/20/2021 8:45 A M Medical Record Number: [id number] Patient Account Number: [id number] Date of Birth/Sex: Treating RN: 21-Oct-1945 (76 y.o. [name], [name] Primary Care Neera Teng: [place] Pepper Other Clinician: Referring  Rachella Basden: Treating Meckenzie Balsley/Extender: Merilyn Baba in Treatment:  3 Vital Signs Time Taken: 08:52 Temperature (F): 97.7 Height (in): 71 Pulse (bpm): 74 Weight (lbs): 252 Respiratory Rate (breaths/min): 17 Body Mass Index (BMI): 35.1 Blood Pressure (mmHg): 172/89 Reference Range: 80 - 120 mg / dl Electronic Signature(s) Signed: 06/20/2021 12:52:20 PM By: Rhae Hammock RN Entered By: Rhae Hammock on 06/20/2021 08:56:23

## 2021-06-24 DIAGNOSIS — M25652 Stiffness of left hip, not elsewhere classified: Secondary | ICD-10-CM | POA: Diagnosis not present

## 2021-06-24 DIAGNOSIS — M25651 Stiffness of right hip, not elsewhere classified: Secondary | ICD-10-CM | POA: Diagnosis not present

## 2021-06-24 DIAGNOSIS — M5117 Intervertebral disc disorders with radiculopathy, lumbosacral region: Secondary | ICD-10-CM | POA: Diagnosis not present

## 2021-06-24 DIAGNOSIS — R262 Difficulty in walking, not elsewhere classified: Secondary | ICD-10-CM | POA: Diagnosis not present

## 2021-06-26 ENCOUNTER — Encounter (HOSPITAL_BASED_OUTPATIENT_CLINIC_OR_DEPARTMENT_OTHER): Payer: PPO | Admitting: Internal Medicine

## 2021-06-26 DIAGNOSIS — M5117 Intervertebral disc disorders with radiculopathy, lumbosacral region: Secondary | ICD-10-CM | POA: Diagnosis not present

## 2021-06-26 DIAGNOSIS — M25651 Stiffness of right hip, not elsewhere classified: Secondary | ICD-10-CM | POA: Diagnosis not present

## 2021-06-26 DIAGNOSIS — M25652 Stiffness of left hip, not elsewhere classified: Secondary | ICD-10-CM | POA: Diagnosis not present

## 2021-06-26 DIAGNOSIS — R262 Difficulty in walking, not elsewhere classified: Secondary | ICD-10-CM | POA: Diagnosis not present

## 2021-06-27 DIAGNOSIS — L97522 Non-pressure chronic ulcer of other part of left foot with fat layer exposed: Secondary | ICD-10-CM | POA: Diagnosis not present

## 2021-06-27 DIAGNOSIS — I10 Essential (primary) hypertension: Secondary | ICD-10-CM | POA: Diagnosis not present

## 2021-06-27 DIAGNOSIS — E785 Hyperlipidemia, unspecified: Secondary | ICD-10-CM | POA: Diagnosis not present

## 2021-06-27 DIAGNOSIS — E1169 Type 2 diabetes mellitus with other specified complication: Secondary | ICD-10-CM | POA: Diagnosis not present

## 2021-06-27 DIAGNOSIS — E11621 Type 2 diabetes mellitus with foot ulcer: Secondary | ICD-10-CM | POA: Diagnosis not present

## 2021-07-01 DIAGNOSIS — M25651 Stiffness of right hip, not elsewhere classified: Secondary | ICD-10-CM | POA: Diagnosis not present

## 2021-07-01 DIAGNOSIS — R262 Difficulty in walking, not elsewhere classified: Secondary | ICD-10-CM | POA: Diagnosis not present

## 2021-07-01 DIAGNOSIS — M5117 Intervertebral disc disorders with radiculopathy, lumbosacral region: Secondary | ICD-10-CM | POA: Diagnosis not present

## 2021-07-01 DIAGNOSIS — M25652 Stiffness of left hip, not elsewhere classified: Secondary | ICD-10-CM | POA: Diagnosis not present

## 2021-07-02 DIAGNOSIS — E11621 Type 2 diabetes mellitus with foot ulcer: Secondary | ICD-10-CM | POA: Diagnosis not present

## 2021-07-02 DIAGNOSIS — M5416 Radiculopathy, lumbar region: Secondary | ICD-10-CM | POA: Diagnosis not present

## 2021-07-02 DIAGNOSIS — M461 Sacroiliitis, not elsewhere classified: Secondary | ICD-10-CM | POA: Diagnosis not present

## 2021-07-02 DIAGNOSIS — Z6835 Body mass index (BMI) 35.0-35.9, adult: Secondary | ICD-10-CM | POA: Diagnosis not present

## 2021-07-02 DIAGNOSIS — I10 Essential (primary) hypertension: Secondary | ICD-10-CM | POA: Diagnosis not present

## 2021-07-03 ENCOUNTER — Ambulatory Visit: Payer: PPO | Admitting: Internal Medicine

## 2021-07-03 DIAGNOSIS — R262 Difficulty in walking, not elsewhere classified: Secondary | ICD-10-CM | POA: Diagnosis not present

## 2021-07-03 DIAGNOSIS — M5117 Intervertebral disc disorders with radiculopathy, lumbosacral region: Secondary | ICD-10-CM | POA: Diagnosis not present

## 2021-07-03 DIAGNOSIS — M25651 Stiffness of right hip, not elsewhere classified: Secondary | ICD-10-CM | POA: Diagnosis not present

## 2021-07-03 DIAGNOSIS — M25652 Stiffness of left hip, not elsewhere classified: Secondary | ICD-10-CM | POA: Diagnosis not present

## 2021-07-04 ENCOUNTER — Other Ambulatory Visit: Payer: Self-pay

## 2021-07-04 ENCOUNTER — Encounter (HOSPITAL_BASED_OUTPATIENT_CLINIC_OR_DEPARTMENT_OTHER): Payer: PPO | Attending: Internal Medicine | Admitting: Internal Medicine

## 2021-07-04 DIAGNOSIS — L97522 Non-pressure chronic ulcer of other part of left foot with fat layer exposed: Secondary | ICD-10-CM | POA: Insufficient documentation

## 2021-07-04 DIAGNOSIS — M545 Low back pain, unspecified: Secondary | ICD-10-CM | POA: Diagnosis not present

## 2021-07-04 DIAGNOSIS — E1142 Type 2 diabetes mellitus with diabetic polyneuropathy: Secondary | ICD-10-CM | POA: Insufficient documentation

## 2021-07-04 DIAGNOSIS — I252 Old myocardial infarction: Secondary | ICD-10-CM | POA: Insufficient documentation

## 2021-07-04 DIAGNOSIS — Z87891 Personal history of nicotine dependence: Secondary | ICD-10-CM | POA: Diagnosis not present

## 2021-07-04 DIAGNOSIS — Z955 Presence of coronary angioplasty implant and graft: Secondary | ICD-10-CM | POA: Diagnosis not present

## 2021-07-04 DIAGNOSIS — I2510 Atherosclerotic heart disease of native coronary artery without angina pectoris: Secondary | ICD-10-CM | POA: Insufficient documentation

## 2021-07-04 DIAGNOSIS — I10 Essential (primary) hypertension: Secondary | ICD-10-CM | POA: Diagnosis not present

## 2021-07-04 DIAGNOSIS — E785 Hyperlipidemia, unspecified: Secondary | ICD-10-CM | POA: Diagnosis not present

## 2021-07-04 DIAGNOSIS — E11621 Type 2 diabetes mellitus with foot ulcer: Secondary | ICD-10-CM | POA: Insufficient documentation

## 2021-07-04 NOTE — Progress Notes (Signed)
Alex Jackson ([id number]) Visit Report for 07/04/2021 Arrival Information Details Patient Name: Date of Service: Alex Jackson. 07/04/2021 8:45 A M Medical Record Number: [id number] Patient Account Number: [id number] Date of Birth/Sex: Treating RN: [birth year] (76 y.o. [name] Primary Care Fusaye Wachtel: London Pepper Other Clinician: Referring August Longest: Treating Shandy Vi/Extender: Merilyn Baba in Treatment: 5 Visit Information History Since Last Visit Added or deleted any medications: No Patient Arrived: [name] Any new allergies or adverse reactions: No Arrival Time: 09:17 Had a fall or experienced change in No Transfer Assistance: None activities of daily living that may affect Patient Identification Verified: Yes risk of falls: Secondary Verification Process Completed: Yes Signs or symptoms of abuse/neglect since last visito No Patient Requires Transmission-Based Precautions: No Hospitalized since last visit: No Patient Has Alerts: Yes Implantable device outside of the clinic excluding No Patient Alerts: 11/18/20 ABI L1.04 cellular tissue based products placed in the center 11/18/20 TBI L 1.35 since last visit: Has Dressing in Place as Prescribed: Yes Pain Present Now: No Electronic Signature(s) Signed: 07/04/2021 12:45:34 PM By: Lorrin Kampa Entered By: Lorrin Chien on 07/04/2021 09:18:43 -------------------------------------------------------------------------------- Encounter Discharge Information Details Patient Name: Date of Service: Alex Jackson, Alex BERT W. 07/04/2021 8:45 A M Medical Record Number: [id number] Patient Account Number: [id number] Date of Birth/Sex: Treating RN: 01/27/1946 (75 y.o. [name] Primary Care Mercades Bajaj: London Pepper Other Clinician: Referring Terie Lear: Treating Joanna Borawski/Extender: Merilyn Baba in Treatment: 5 Encounter Discharge Information Items Post Procedure  Vitals Discharge Condition: Stable Temperature (F): 98.7 Ambulatory Status: Cane Pulse (bpm): 71 Discharge Destination: Home Respiratory Rate (breaths/min): 18 Transportation: Private Auto Blood Pressure (mmHg): 174/76 Schedule Follow-up Appointment: Yes Clinical Summary of Care: Provided on 07/04/2021 Form Type Recipient Paper Patient Patient Electronic Signature(s) Signed: 07/04/2021 12:45:34 PM By: Lorrin Gabriel Entered By: Lorrin Dasch on 07/04/2021 09:40:46 -------------------------------------------------------------------------------- Lower Extremity Assessment Details Patient Name: Date of Service: Alex [place] W. 07/04/2021 8:45 A M Medical Record Number: [id number] Patient Account Number: [id number] Date of Birth/Sex: Treating RN: 05-11-1945 (76 y.o. [name] Primary Care Martasia Talamante: London Pepper Other Clinician: Referring Jceon Alverio: Treating Rahsaan Weakland/Extender: Merilyn Baba in Treatment: 5 Edema Assessment Assessed: Shirlyn Goltz: Yes] Patrice Paradise: No] Edema: [Left: N] [Right: o] Calf Left: Right: Point of Measurement: 36 cm From Medial Instep 37 cm Ankle Left: Right: Point of Measurement: 12 cm From Medial Instep 23 cm Vascular Assessment Pulses: Dorsalis Pedis Palpable: [Left:Yes] Electronic Signature(s) Signed: 07/04/2021 12:45:34 PM By: Lorrin Detwiler Entered By: Lorrin Baise on 07/04/2021 09:19:55 -------------------------------------------------------------------------------- Multi Wound Chart Details Patient Name: Date of Service: Alex Jackson, Alex BERT W. 07/04/2021 8:45 A M Medical Record Number: [id number] Patient Account Number: [id number] Date of Birth/Sex: Treating RN: [birth year] (76 y.o. M) Primary Care Demetress Tift: London Pepper Other Clinician: Referring Ellarae Nevitt: Treating Janayah Zavada/Extender: Merilyn Baba in Treatment: 5 Vital Signs Height(in): 61 Pulse(bpm): 21 Weight(lbs): 78 Blood  Pressure(mmHg): 174/76 Body Mass Index(BMI): 35.1 Temperature(F): 98.7 Respiratory Rate(breaths/min): 18 Photos: [1:Left T Great oe] [N/A:N/A N/A] Wound Location: [1:Blister] [N/A:N/A] Wounding Event: [1:Diabetic Wound/Ulcer of the Lower] [N/A:N/A] Primary Etiology: [1:Extremity Cataracts, Coronary Artery Disease, N/A] Comorbid History: [1:Hypertension, Myocardial Infarction, Type II Diabetes 10/02/2020] [N/A:N/A] Date Acquired: [1:5] [N/A:N/A] Weeks of Treatment: [1:Open] [N/A:N/A] Wound Status: [1:No] [N/A:N/A] Wound Recurrence: [1:0.5x0.4x0.4] [N/A:N/A] Measurements L x W x D (cm) [1:0.157] [N/A:N/A] A (cm) : rea [1:0.063] [N/A:N/A] Volume (cm) : [1:44.50%] [N/A:N/A] % Reduction in A [1:rea: 44.20%] [N/A:N/A] %  Reduction in Volume: [1:3] Starting Position 1 (o'clock): [1:12] Ending Position 1 (o'clock): [1:0.5] Maximum Distance 1 (cm): [1:Yes] [N/A:N/A] Undermining: [1:Grade 3] [N/A:N/A] Classification: [1:Medium] [N/A:N/A] Exudate A mount: [1:Serosanguineous] [N/A:N/A] Exudate Type: [1:red, brown] [N/A:N/A] Exudate Color: [1:Distinct, outline attached] [N/A:N/A] Wound Margin: [1:Large (67-100%)] [N/A:N/A] Granulation A mount: [1:Red] [N/A:N/A] Granulation Quality: [1:Small (1-33%)] [N/A:N/A] Necrotic A mount: [1:Fat Layer (Subcutaneous Tissue): Yes N/A] Exposed Structures: [1:Fascia: No Tendon: No Muscle: No Joint: No Bone: No None] [N/A:N/A] Epithelialization: [1:Debridement - Excisional] [N/A:N/A] Debridement: Pre-procedure Verification/Time Out 09:24 [N/A:N/A] Taken: [1:Other] [N/A:N/A] Pain Control: [1:Callus, Subcutaneous] [N/A:N/A] Tissue Debrided: [1:Skin/Subcutaneous Tissue] [N/A:N/A] Level: [1:0.2] [N/A:N/A] Debridement A (sq cm): [1:rea Curette] [N/A:N/A] Instrument: [1:Minimum] [N/A:N/A] Bleeding: [1:Pressure] [N/A:N/A] Hemostasis A chieved: [1:Procedure was tolerated well] [N/A:N/A] Debridement Treatment Response: [1:0.5x0.4x0.4] [N/A:N/A] Post  Debridement Measurements L x W x D (cm) [1:0.063] [N/A:N/A] Post Debridement Volume: (cm) [1:Calloused periwound] [N/A:N/A] Assessment Notes: [1:Debridement] [N/A:N/A] Treatment Notes Wound #1 (Toe Great) Wound Laterality: Left Cleanser Soap and Water Discharge Instruction: May shower and wash wound with dial antibacterial soap and water prior to dressing change. Peri-Wound Care Topical Gentamicin Discharge Instruction: As directed by physician Primary Dressing Hydrofera Blue Ready Foam, 2.5 x2.5 in Discharge Instruction: Apply to wound bed as instructed Secondary Dressing Woven Gauze Sponges 2x2 in Discharge Instruction: Apply over primary dressing and under the toe rolled gauze as directed. Optifoam Non-Adhesive Dressing, 4x4 in Discharge Instruction: Cut to make foam donut Secured With Conforming Stretch Gauze Bandage, Sterile 2x75 (in/in) Discharge Instruction: Secure with stretch gauze as directed. 70M Medipore H Soft Cloth Surgical Tape, 4 x 10 (in/yd) Discharge Instruction: Secure with tape as directed. Compression Wrap Compression Stockings Add-Ons Electronic Signature(s) Signed: 07/04/2021 10:01:33 AM By: Kalman Shan DO Entered By: Kalman Shan on 07/04/2021 09:44:08 -------------------------------------------------------------------------------- Multi-Disciplinary Care Plan Details Patient Name: Date of Service: Alex Jackson, Alex BERT W. 07/04/2021 8:45 A M Medical Record Number: [id number] Patient Account Number: [id number] Date of Birth/Sex: Treating RN: 1945/12/01 (76 y.o. [name] Primary Care Julis Haubner: London Pepper Other Clinician: Referring Sai Moura: Treating Basya Casavant/Extender: Merilyn Baba in Treatment: 5 Multidisciplinary Care Plan reviewed with physician Active Inactive Nutrition Nursing Diagnoses: Potential for alteratiion in Nutrition/Potential for imbalanced nutrition Goals: Patient/caregiver agrees to and  verbalizes understanding of need to obtain nutritional consultation Date Initiated: 05/26/2021 Date Inactivated: 06/06/2021 Target Resolution Date: 06/06/2021 Goal Status: Met Patient/caregiver will maintain therapeutic glucose control Date Initiated: 06/06/2021 Target Resolution Date: 08/08/2021 Goal Status: Active Interventions: Assess patient nutrition upon admission and as needed per policy Provide education on nutrition Treatment Activities: Education provided on Nutrition : 06/20/2021 Obtain HgA1c : 05/26/2021 Patient referred to Primary Care Physician for further nutritional evaluation : 05/26/2021 Notes: Wound/Skin Impairment Nursing Diagnoses: Knowledge deficit related to ulceration/compromised skin integrity Goals: Patient/caregiver will verbalize understanding of skin care regimen Date Initiated: 05/26/2021 Target Resolution Date: 08/08/2021 Goal Status: Active Ulcer/skin breakdown will have a volume reduction of 50% by week 8 Date Initiated: 06/06/2021 Target Resolution Date: 08/08/2021 Goal Status: Active Interventions: Assess patient/caregiver ability to perform ulcer/skin care regimen upon admission and as needed Assess ulceration(s) every visit Provide education on ulcer and skin care Treatment Activities: Skin care regimen initiated : 05/26/2021 Topical wound management initiated : 05/26/2021 Notes: Electronic Signature(s) Signed: 07/04/2021 12:45:34 PM By: Lorrin Burmaster Entered By: Lorrin Wiles on 07/04/2021 09:17:26 -------------------------------------------------------------------------------- Pain Assessment Details Patient Name: Date of Service: Alex Jackson, Alex BERT W. 07/04/2021 8:45 A M Medical Record Number: [id number] Patient Account Number: [id number] Date  of Birth/Sex: Treating RN: 1945/08/03 (76 y.o. [name] Primary Care Breannah Kratt: London Pepper Other Clinician: Referring Hiedi Touchton: Treating Nawaf Strange/Extender: Merilyn Baba in  Treatment: 5 Active Problems Location of Pain Severity and Description of Pain Patient Has Paino No Site Locations Pain Management and Medication Current Pain Management: Electronic Signature(s) Signed: 07/04/2021 12:45:34 PM By: Lorrin Kimoto Entered By: Lorrin Mitchell on 07/04/2021 09:19:40 -------------------------------------------------------------------------------- Patient/Caregiver Education Details Patient Name: Date of Service: Alex Jackson 3/3/2023andnbsp8:45 A M Medical Record Number: [id number] Patient Account Number: [id number] Date of Birth/Gender: Treating RN: 09-10-1945 (76 y.o. [name] Primary Care Physician: London Pepper Other Clinician: Referring Physician: Treating Physician/Extender: Merilyn Baba in Treatment: 5 Education Assessment Education Provided To: Patient Education Topics Provided Nutrition: Methods: Explain/Verbal, Printed Responses: State content correctly Wound/Skin Impairment: Methods: Explain/Verbal, Printed Responses: State content correctly Electronic Signature(s) Signed: 07/04/2021 12:45:34 PM By: Lorrin Carvey Entered By: Lorrin Tuzzolino on 07/04/2021 09:17:46 -------------------------------------------------------------------------------- Wound Assessment Details Patient Name: Date of Service: Alex Jackson, Alex BERT W. 07/04/2021 8:45 A M Medical Record Number: [id number] Patient Account Number: [id number] Date of Birth/Sex: Treating RN: 1946-01-26 (76 y.o. [name] Primary Care Lylianna Fraiser: London Pepper Other Clinician: Referring Alondria Mousseau: Treating Pier Bosher/Extender: Merilyn Baba in Treatment: 5 Wound Status Wound Number: 1 Primary Diabetic Wound/Ulcer of the Lower Extremity Etiology: Wound Location: Left T Great oe Wound Open Wounding Event: Blister Status: Date Acquired: 10/02/2020 Comorbid Cataracts, Coronary Artery Disease, Hypertension,  Myocardial Weeks Of Treatment: 5 History: Infarction, Type II Diabetes Clustered Wound: No Photos Wound Measurements Length: (cm) 0.5 Width: (cm) 0.4 Depth: (cm) 0.4 Area: (cm) 0.157 Volume: (cm) 0.063 % Reduction in Area: 44.5% % Reduction in Volume: 44.2% Epithelialization: None Tunneling: No Undermining: Yes Starting Position (o'clock): 3 Ending Position (o'clock): 12 Maximum Distance: (cm) 0.5 Wound Description Classification: Grade 3 Wound Margin: Distinct, outline attached Exudate Amount: Medium Exudate Type: Serosanguineous Exudate Color: red, brown Foul Odor After Cleansing: No Slough/Fibrino Yes Wound Bed Granulation Amount: Large (67-100%) Exposed Structure Granulation Quality: Red Fascia Exposed: No Necrotic Amount: Small (1-33%) Fat Layer (Subcutaneous Tissue) Exposed: Yes Necrotic Quality: Adherent Slough Tendon Exposed: No Muscle Exposed: No Joint Exposed: No Bone Exposed: No Assessment Notes Calloused periwound Treatment Notes Wound #1 (Toe Great) Wound Laterality: Left Cleanser Soap and Water Discharge Instruction: May shower and wash wound with dial antibacterial soap and water prior to dressing change. Peri-Wound Care Topical Gentamicin Discharge Instruction: As directed by physician Primary Dressing Hydrofera Blue Ready Foam, 2.5 x2.5 in Discharge Instruction: Apply to wound bed as instructed Secondary Dressing Woven Gauze Sponges 2x2 in Discharge Instruction: Apply over primary dressing and under the toe rolled gauze as directed. Optifoam Non-Adhesive Dressing, 4x4 in Discharge Instruction: Cut to make foam donut Secured With Conforming Stretch Gauze Bandage, Sterile 2x75 (in/in) Discharge Instruction: Secure with stretch gauze as directed. 40M Medipore H Soft Cloth Surgical T ape, 4 x 10 (in/yd) Discharge Instruction: Secure with tape as directed. Compression Wrap Compression Stockings Add-Ons Electronic Signature(s) Signed:  07/04/2021 12:45:34 PM By: Lorrin Kempe Signed: 07/04/2021 12:46:23 PM By: Deon Pilling RN, BSN Entered By: Deon Pilling on 07/04/2021 09:22:22 -------------------------------------------------------------------------------- Vitals Details Patient Name: Date of Service: Alex Jackson, Alex BERT W. 07/04/2021 8:45 A M Medical Record Number: [id number] Patient Account Number: [id number] Date of Birth/Sex: Treating RN: 1946-01-09 (76 y.o. [name] Primary Care [place] Dube: London Pepper Other Clinician: Referring Eleena Grater: Treating Roslin Norwood/Extender: Merilyn Baba in  Treatment: 5 Vital Signs Time Taken: 09:19 Temperature (F): 98.7 Height (in): 71 Pulse (bpm): 71 Weight (lbs): 252 Respiratory Rate (breaths/min): 18 Body Mass Index (BMI): 35.1 Blood Pressure (mmHg): 174/76 Reference Range: 80 - 120 mg / dl Electronic Signature(s) Signed: 07/04/2021 12:45:34 PM By: Lorrin Yaun Entered By: Lorrin Beaulac on 07/04/2021 09:19:34

## 2021-07-04 NOTE — Progress Notes (Signed)
STEIN, BURGOS ([id number]) Visit Report for 07/04/2021 Chief Complaint Document Details Patient Name: Date of Service: Alex Jackson. 07/04/2021 8:45 A M Medical Record Number: [id number] Patient Account Number: [id number] Date of Birth/Sex: Treating RN: [birth year] (76 y.o. M) Primary Care Provider: London Pepper Other Clinician: Referring Provider: Treating Provider/Extender: Merilyn Baba in Treatment: 5 Information Obtained from: Patient Chief Complaint Left great toe wound Electronic Signature(s) Signed: 07/04/2021 10:01:33 AM By: Kalman Shan DO Entered By: Kalman Shan on 07/04/2021 09:45:48 -------------------------------------------------------------------------------- Debridement Details Patient Name: Date of Service: Alex Jackson, Alex BERT W. 07/04/2021 8:45 A M Medical Record Number: [id number] Patient Account Number: [id number] Date of Birth/Sex: Treating RN: 1945-12-15 (76 y.o. Alex Jackson Primary Care Provider: London Pepper Other Clinician: Referring Provider: Treating Provider/Extender: Merilyn Baba in Treatment: 5 Debridement Performed for Assessment: Wound #1 Left T Great oe Performed By: Physician Kalman Shan, DO Debridement Type: Debridement Severity of Tissue Pre Debridement: Fat layer exposed Level of Consciousness (Pre-procedure): Awake and Alert Pre-procedure Verification/Time Out Yes - 09:24 Taken: Start Time: 09:25 Pain Control: Other : Benzocaine T Area Debrided (L x W): otal 0.5 (cm) x 0.4 (cm) = 0.2 (cm) Tissue and other material debrided: Non-Viable, Callus, Subcutaneous Level: Skin/Subcutaneous Tissue Debridement Description: Excisional Instrument: Curette Bleeding: Minimum Hemostasis Achieved: Pressure End Time: 09:32 Response to Treatment: Procedure was tolerated well Level of Consciousness (Post- Awake and Alert procedure): Post Debridement Measurements of Total  Wound Length: (cm) 0.5 Width: (cm) 0.4 Depth: (cm) 0.4 Volume: (cm) 0.063 Character of Wound/Ulcer Post Debridement: Stable Severity of Tissue Post Debridement: Fat layer exposed Post Procedure Diagnosis Same as Pre-procedure Electronic Signature(s) Signed: 07/04/2021 10:01:33 AM By: Kalman Shan DO Signed: 07/04/2021 12:45:34 PM By: Lorrin Avino Entered By: Lorrin Labate on 07/04/2021 09:35:24 -------------------------------------------------------------------------------- HPI Details Patient Name: Date of Service: Alex Jackson, Alex BERT W. 07/04/2021 8:45 A M Medical Record Number: [id number] Patient Account Number: [id number] Date of Birth/Sex: Treating RN: 08-May-1945 (76 y.o. M) Primary Care Provider: London Pepper Other Clinician: Referring Provider: Treating Provider/Extender: Merilyn Baba in Treatment: 5 History of Present Illness HPI Description: Admission 05/26/2021 Alex Jackson is a 76 year old male with a past medical history of controlled type 2 diabetes on oral agents, chronic back pain and coronary artery disease status post stenting to the LAD that presents to the clinic for a 68-month history of nonhealing wound to the left great toe plantar aspect. He has peripheral neuropathy and is not sure how the wound developed. He has been following with Dr. Doran Durand for this issue and has been using Betadine to the area daily. He does not offload the area. He denies signs of infection. 2/3; patient presents for follow-up. He obtained his left foot x-ray. He has been using gentamicin with hydrofera blue toe to the wound bed with dressing changes. He has no issues or complaints today. He denies signs of infection. 2/17; patient presents for follow-up. He had his MRI that showed bone marrow edema at the distal aspect concerning for osteomyelitis. Currently patient denies signs of infection. He feels well and has been using gentamicin with Hydrofera Blue to  the wound bed. He states that he has ordered custom diabetic shoes. He obtains these at the end of the month. 3/3; patient presents for follow-up. He had a scheduled appointment with Dr. Megan Salon, infectious disease yesterday. Patient canceled and at this time does not want to follow-up with any other doctors for  this issue. He currently denies signs of infection. He has been using gentamicin ointment with Hydrofera Blue to the wound bed. He has orthopedic shoes and has an area that offloads the left great toe. Electronic Signature(s) Signed: 07/04/2021 10:01:33 AM By: Kalman Shan DO Entered By: Kalman Shan on 07/04/2021 09:51:52 -------------------------------------------------------------------------------- Physical Exam Details Patient Name: Date of Service: Alex [place] W. 07/04/2021 8:45 A M Medical Record Number: [id number] Patient Account Number: [id number] Date of Birth/Sex: Treating RN: Sep 17, 1945 (76 y.o. M) Primary Care Provider: London Pepper Other Clinician: Referring Provider: Treating Provider/Extender: Merilyn Baba in Treatment: 5 Constitutional respirations regular, non-labored and within target range for patient.. Cardiovascular 2+ dorsalis pedis/posterior tibialis pulses. Psychiatric pleasant and cooperative. Notes Left great toe: T the plantar aspect there is an open wound with granulation tissue non viable tissue and circumferential callus with undermining. No o surrounding signs of infection. Electronic Signature(s) Signed: 07/04/2021 10:01:33 AM By: Kalman Shan DO Entered By: Kalman Shan on 07/04/2021 09:54:20 -------------------------------------------------------------------------------- Physician Orders Details Patient Name: Date of Service: Alex Jackson, Alex BERT W. 07/04/2021 8:45 A M Medical Record Number: [id number] Patient Account Number: [id number] Date of Birth/Sex: Treating RN: 24-May-1945 (76 y.o. Alex Jackson Primary Care Provider: London Pepper Other Clinician: Referring Provider: Treating Provider/Extender: Merilyn Baba in Treatment: 5 Verbal / Phone Orders: No Diagnosis Coding ICD-10 Coding Code Description [phone] Non-pressure chronic ulcer of other part of left foot with fat layer exposed E11.621 Type 2 diabetes mellitus with foot ulcer Z95.5 Presence of coronary angioplasty implant and graft E78.5 Hyperlipidemia, unspecified M54.50 Low back pain, unspecified Follow-up Appointments ppointment in 2 weeks. - Dr. Heber [place] [facility], Room 7) Return A Other: - [employer]- wound care supplier Bathing/ Shower/ Hygiene May shower and wash wound with soap and water. - with dressing changes. Edema Control - Lymphedema / SCD / Other Elevate legs to the level of the heart or above for 30 minutes daily and/or when sitting, a frequency of: - 3-4 times a day throughout the day. Avoid standing for long periods of time. Moisturize legs daily. - every night before bed. Off-Loading Other: - Continue to wear Diabetic shoes Wound Treatment Wound #1 - T Great oe Wound Laterality: Left Cleanser: Soap and Water 1 x Per Day/30 Days Discharge Instructions: May shower and wash wound with dial antibacterial soap and water prior to dressing change. Topical: Gentamicin 1 x Per Day/30 Days Discharge Instructions: As directed by physician Prim Dressing: Hydrofera Blue Ready Foam, 2.5 x2.5 in 1 x Per Day/30 Days ary Discharge Instructions: Apply to wound bed as instructed Secondary Dressing: Woven Gauze Sponges 2x2 in 1 x Per Day/30 Days Discharge Instructions: Apply over primary dressing and under the toe rolled gauze as directed. Secondary Dressing: Optifoam Non-Adhesive Dressing, 4x4 in 1 x Per Day/30 Days Discharge Instructions: Cut to make foam donut Secured With: Conforming Stretch Gauze Bandage, Sterile 2x75 (in/in) 1 x Per Day/30 Days Discharge  Instructions: Secure with stretch gauze as directed. Secured With: 33M Medipore H Soft Cloth Surgical T ape, 4 x 10 (in/yd) 1 x Per Day/30 Days Discharge Instructions: Secure with tape as directed. Electronic Signature(s) Signed: 07/04/2021 10:01:33 AM By: Kalman Shan DO Entered By: Kalman Shan on 07/04/2021 09:54:57 -------------------------------------------------------------------------------- Problem List Details Patient Name: Date of Service: Alex Jackson, Alex BERT W. 07/04/2021 8:45 A M Medical Record Number: [id number] Patient Account Number: [id number] Date of Birth/Sex: Treating RN: [birth year] (76 y.o. Alex Jackson Primary  Care Provider: London Pepper Other Clinician: Referring Provider: Treating Provider/Extender: Merilyn Baba in Treatment: 5 Active Problems ICD-10 Encounter Code Description Active Date MDM Diagnosis [phone] Non-pressure chronic ulcer of other part of left foot with fat layer exposed 05/26/2021 No Yes E11.621 Type 2 diabetes mellitus with foot ulcer 05/26/2021 No Yes Z95.5 Presence of coronary angioplasty implant and graft 05/26/2021 No Yes E78.5 Hyperlipidemia, unspecified 05/26/2021 No Yes M54.50 Low back pain, unspecified 05/26/2021 No Yes Inactive Problems Resolved Problems Electronic Signature(s) Signed: 07/04/2021 10:01:33 AM By: Kalman Shan DO Entered By: Kalman Shan on 07/04/2021 09:43:52 -------------------------------------------------------------------------------- Progress Note Details Patient Name: Date of Service: Alex Jackson, Alex BERT W. 07/04/2021 8:45 A M Medical Record Number: [id number] Patient Account Number: [id number] Date of Birth/Sex: Treating RN: [birth year] (76 y.o. M) Primary Care Provider: London Pepper Other Clinician: Referring Provider: Treating Provider/Extender: Merilyn Baba in Treatment: 5 Subjective Chief Complaint Information obtained from Patient Left  great toe wound History of Present Illness (HPI) Admission 05/26/2021 Alex Jackson is a 77 year old male with a past medical history of controlled type 2 diabetes on oral agents, chronic back pain and coronary artery disease status post stenting to the LAD that presents to the clinic for a 12-month history of nonhealing wound to the left great toe plantar aspect. He has peripheral neuropathy and is not sure how the wound developed. He has been following with Dr. Doran Durand for this issue and has been using Betadine to the area daily. He does not offload the area. He denies signs of infection. 2/3; patient presents for follow-up. He obtained his left foot x-ray. He has been using gentamicin with hydrofera blue toe to the wound bed with dressing changes. He has no issues or complaints today. He denies signs of infection. 2/17; patient presents for follow-up. He had his MRI that showed bone marrow edema at the distal aspect concerning for osteomyelitis. Currently patient denies signs of infection. He feels well and has been using gentamicin with Hydrofera Blue to the wound bed. He states that he has ordered custom diabetic shoes. He obtains these at the end of the month. 3/3; patient presents for follow-up. He had a scheduled appointment with Dr. Megan Salon, infectious disease yesterday. Patient canceled and at this time does not want to follow-up with any other doctors for this issue. He currently denies signs of infection. He has been using gentamicin ointment with Hydrofera Blue to the wound bed. He has orthopedic shoes and has an area that offloads the left great toe. Patient History Information obtained from Patient. Family History Cancer - Father,Mother, Hypertension - Mother, No family history of Diabetes, Heart Disease, Hereditary Spherocytosis, Kidney Disease, Lung Disease, Seizures, Stroke, Thyroid Problems, Tuberculosis. Social History Former smoker - quit 1985, Alcohol Use - Daily - wine  or scotch, Drug Use - No History, Caffeine Use - Never. Medical History Eyes Patient has history of Cataracts - removed Denies history of Glaucoma, Optic Neuritis Ear/Nose/Mouth/Throat Denies history of Chronic sinus problems/congestion, Middle ear problems Hematologic/Lymphatic Denies history of Hemophilia, Human Immunodeficiency Virus, Lymphedema, Sickle Cell Disease Respiratory Denies history of Aspiration, Asthma, Chronic Obstructive Pulmonary Disease (COPD), Pneumothorax, Sleep Apnea, Tuberculosis Cardiovascular Patient has history of Coronary Artery Disease, Hypertension, Myocardial Infarction - NSTEMI Denies history of Angina, Arrhythmia, Congestive Heart Failure, Deep Vein Thrombosis, Hypotension, Peripheral Arterial Disease, Peripheral Venous Disease, Phlebitis, Vasculitis Gastrointestinal Denies history of Cirrhosis , Colitis, Crohnoos, Hepatitis A, Hepatitis B, Hepatitis C Endocrine Patient has history of Type II  Diabetes - PCP notes type II; per patient takes metformin been told prediabetic Denies history of Type I Diabetes Genitourinary Denies history of End Stage Renal Disease Immunological Denies history of Lupus Erythematosus, Raynaudoos, Scleroderma Integumentary (Skin) Denies history of History of Burn Musculoskeletal Denies history of Gout, Rheumatoid Arthritis, Osteoarthritis, Osteomyelitis Neurologic Denies history of Dementia, Neuropathy, Quadriplegia, Paraplegia, Seizure Disorder Oncologic Denies history of Received Chemotherapy, Received Radiation Psychiatric Denies history of Anorexia/bulimia, Confinement Anxiety Hospitalization/Surgery History - stents MRI 2017and2019. - torn meniscus left 5 weeks ago. Medical A Surgical History Notes nd Constitutional Symptoms (General Health) Back pain x6 months Cardiovascular hyperlipidemia Genitourinary Elevated PSA Objective Constitutional respirations regular, non-labored and within target range for  patient.. Vitals Time Taken: 9:19 AM, Height: 71 in, Weight: 252 lbs, BMI: 35.1, Temperature: 98.7 F, Pulse: 71 bpm, Respiratory Rate: 18 breaths/min, Blood Pressure: 174/76 mmHg. Cardiovascular 2+ dorsalis pedis/posterior tibialis pulses. Psychiatric pleasant and cooperative. General Notes: Left great toe: T the plantar aspect there is an open wound with granulation tissue non viable tissue and circumferential callus with o undermining. No surrounding signs of infection. Integumentary (Hair, Skin) Wound #1 status is Open. Original cause of wound was Blister. The date acquired was: 10/02/2020. The wound has been in treatment 5 weeks. The wound is located on the Left T Great. The wound measures 0.5cm length x 0.4cm width x 0.4cm depth; 0.157cm^2 area and 0.063cm^3 volume. There is Fat Layer oe (Subcutaneous Tissue) exposed. There is no tunneling noted, however, there is undermining starting at 3:00 and ending at 12:00 with a maximum distance of 0.5cm. There is a medium amount of serosanguineous drainage noted. The wound margin is distinct with the outline attached to the wound base. There is large (67-100%) red granulation within the wound bed. There is a small (1-33%) amount of necrotic tissue within the wound bed including Adherent Slough. General Notes: Calloused periwound Assessment Active Problems ICD-10 Non-pressure chronic ulcer of other part of left foot with fat layer exposed Type 2 diabetes mellitus with foot ulcer Presence of coronary angioplasty implant and graft Hyperlipidemia, unspecified Low back pain, unspecified Patient's wound is stable. I debrided nonviable tissue. No surrounding signs of infection. I recommended continuing gentamicin ointment with Hydrofera Blue and offloading. He cannot do an offloading cast or the defender boot due to his back pain and knee pain. He does have orthopedic shoes that have offloading for the toe. Patient had an MRI done on 06/14/2021 that  was concerning for osteomyelitis. I again went over the results with the patient and I had recommended an ID referral to discuss antibiotic regiment. Patient is adamant that he does not want to follow-up with infectious disease at this time. He is aware of the risk of losing his toe or even foot if his condition were to worsen. He knows to call with any questions or concerns. Procedures Wound #1 Pre-procedure diagnosis of Wound #1 is a Diabetic Wound/Ulcer of the Lower Extremity located on the Left T Great .Severity of Tissue Pre Debridement is: oe Fat layer exposed. There was a Excisional Skin/Subcutaneous Tissue Debridement with a total area of 0.2 sq cm performed by Kalman Shan, DO. With the following instrument(s): Curette to remove Non-Viable tissue/material. Material removed includes Callus and Subcutaneous Tissue and after achieving pain control using Other (Benzocaine). No specimens were taken. A time out was conducted at 09:24, prior to the start of the procedure. A Minimum amount of bleeding was controlled with Pressure. The procedure was tolerated well. Post Debridement Measurements:  0.5cm length x 0.4cm width x 0.4cm depth; 0.063cm^3 volume. Character of Wound/Ulcer Post Debridement is stable. Severity of Tissue Post Debridement is: Fat layer exposed. Post procedure Diagnosis Wound #1: Same as Pre-Procedure Plan Follow-up Appointments: Return Appointment in 2 weeks. - Dr. Heber [place] [facility], Room 7) Other: - [employer]- wound care supplier Bathing/ Shower/ Hygiene: May shower and wash wound with soap and water. - with dressing changes. Edema Control - Lymphedema / SCD / Other: Elevate legs to the level of the heart or above for 30 minutes daily and/or when sitting, a frequency of: - 3-4 times a day throughout the day. Avoid standing for long periods of time. Moisturize legs daily. - every night before bed. Off-Loading: Other: - Continue to wear Diabetic shoes WOUND #1:  - T Great Wound Laterality: Left oe Cleanser: Soap and Water 1 x Per Day/30 Days Discharge Instructions: May shower and wash wound with dial antibacterial soap and water prior to dressing change. Topical: Gentamicin 1 x Per Day/30 Days Discharge Instructions: As directed by physician Prim Dressing: Hydrofera Blue Ready Foam, 2.5 x2.5 in 1 x Per Day/30 Days ary Discharge Instructions: Apply to wound bed as instructed Secondary Dressing: Woven Gauze Sponges 2x2 in 1 x Per Day/30 Days Discharge Instructions: Apply over primary dressing and under the toe rolled gauze as directed. Secondary Dressing: Optifoam Non-Adhesive Dressing, 4x4 in 1 x Per Day/30 Days Discharge Instructions: Cut to make foam donut Secured With: Conforming Stretch Gauze Bandage, Sterile 2x75 (in/in) 1 x Per Day/30 Days Discharge Instructions: Secure with stretch gauze as directed. Secured With: 26M Medipore H Soft Cloth Surgical T ape, 4 x 10 (in/yd) 1 x Per Day/30 Days Discharge Instructions: Secure with tape as directed. 1. In office sharp debridement 2. Gentamicin ointment with Hydrofera Blue 3. Aggressive offloadingooorthopedic shoes with offloading felt pad Electronic Signature(s) Signed: 07/04/2021 10:01:33 AM By: Kalman Shan DO Entered By: Kalman Shan on 07/04/2021 10:00:04 -------------------------------------------------------------------------------- HxROS Details Patient Name: Date of Service: Alex Jackson, Alex BERT W. 07/04/2021 8:45 A M Medical Record Number: [id number] Patient Account Number: [id number] Date of Birth/Sex: Treating RN: 1946/04/08 (76 y.o. M) Primary Care Provider: London Pepper Other Clinician: Referring Provider: Treating Provider/Extender: Merilyn Baba in Treatment: 5 Information Obtained From Patient Constitutional Symptoms (General Health) Medical History: Past Medical History Notes: Back pain x6 months Eyes Medical History: Positive for:  Cataracts - removed Negative for: Glaucoma; Optic Neuritis Ear/Nose/Mouth/Throat Medical History: Negative for: Chronic sinus problems/congestion; Middle ear problems Hematologic/Lymphatic Medical History: Negative for: Hemophilia; Human Immunodeficiency Virus; Lymphedema; Sickle Cell Disease Respiratory Medical History: Negative for: Aspiration; Asthma; Chronic Obstructive Pulmonary Disease (COPD); Pneumothorax; Sleep Apnea; Tuberculosis Cardiovascular Medical History: Positive for: Coronary Artery Disease; Hypertension; Myocardial Infarction - NSTEMI Negative for: Angina; Arrhythmia; Congestive Heart Failure; Deep Vein Thrombosis; Hypotension; Peripheral Arterial Disease; Peripheral Venous Disease; Phlebitis; Vasculitis Past Medical History Notes: hyperlipidemia Gastrointestinal Medical History: Negative for: Cirrhosis ; Colitis; Crohns; Hepatitis A; Hepatitis B; Hepatitis C Endocrine Medical History: Positive for: Type II Diabetes - PCP notes type II; per patient takes metformin been told prediabetic Negative for: Type I Diabetes Time with diabetes: prediabetic 3-4 years per pt Treated with: Oral agents, Diet Blood sugar tested every day: No Genitourinary Medical History: Negative for: End Stage Renal Disease Past Medical History Notes: Elevated PSA Immunological Medical History: Negative for: Lupus Erythematosus; Raynauds; Scleroderma Integumentary (Skin) Medical History: Negative for: History of Burn Musculoskeletal Medical History: Negative for: Gout; Rheumatoid Arthritis; Osteoarthritis; Osteomyelitis Neurologic Medical History: Negative  for: Dementia; Neuropathy; Quadriplegia; Paraplegia; Seizure Disorder Oncologic Medical History: Negative for: Received Chemotherapy; Received Radiation Psychiatric Medical History: Negative for: Anorexia/bulimia; Confinement Anxiety HBO Extended History Items Eyes: Cataracts Immunizations Pneumococcal Vaccine: Received  Pneumococcal Vaccination: Yes Received Pneumococcal Vaccination On or After 60th Birthday: Yes Implantable Devices No devices added Hospitalization / Surgery History Type of Hospitalization/Surgery stents MRI 2017and2019 torn meniscus left 5 weeks ago Family and Social History Cancer: Yes - Father,Mother; Diabetes: No; Heart Disease: No; Hereditary Spherocytosis: No; Hypertension: Yes - Mother; Kidney Disease: No; Lung Disease: No; Seizures: No; Stroke: No; Thyroid Problems: No; Tuberculosis: No; Former smoker - quit 1985; Alcohol Use: Daily - wine or scotch; Drug Use: No History; Caffeine Use: Never; Financial Concerns: No; Food, Clothing or Shelter Needs: No; Support System Lacking: No; Transportation Concerns: No Electronic Signature(s) Signed: 07/04/2021 10:01:33 AM By: Kalman Shan DO Entered By: Kalman Shan on 07/04/2021 09:53:41 -------------------------------------------------------------------------------- SuperBill Details Patient Name: Date of Service: Alex Jackson, Alex BERT W. 07/04/2021 Medical Record Number: [id number] Patient Account Number: [id number] Date of Birth/Sex: Treating RN: 1945/08/31 (76 y.o. Alex Jackson Primary Care Provider: London Pepper Other Clinician: Referring Provider: Treating Provider/Extender: Merilyn Baba in Treatment: 5 Diagnosis Coding ICD-10 Codes Code Description [phone] Non-pressure chronic ulcer of other part of left foot with fat layer exposed E11.621 Type 2 diabetes mellitus with foot ulcer Z95.5 Presence of coronary angioplasty implant and graft E78.5 Hyperlipidemia, unspecified M54.50 Low back pain, unspecified Facility Procedures CPT4 Code: [id number] Description: [id number] - DEB SUBQ TISSUE 20 SQ CM/< ICD-10 Diagnosis Description L97.522 Non-pressure chronic ulcer of other part of left foot with fat layer exposed Modifier: Quantity: 1 Physician Procedures : CPT4 Code Description Modifier [id number] - WC PHYS SUBQ TISS 20 SQ CM ICD-10 Diagnosis Description L97.522 Non-pressure chronic ulcer of other part of left foot with fat layer exposed Quantity: 1 Electronic Signature(s) Signed: 07/04/2021 10:01:33 AM By: Kalman Shan DO Entered By: Kalman Shan on 07/04/2021 10:01:03

## 2021-07-07 ENCOUNTER — Other Ambulatory Visit: Payer: Self-pay | Admitting: Neurological Surgery

## 2021-07-07 DIAGNOSIS — M5416 Radiculopathy, lumbar region: Secondary | ICD-10-CM

## 2021-07-08 DIAGNOSIS — R262 Difficulty in walking, not elsewhere classified: Secondary | ICD-10-CM | POA: Diagnosis not present

## 2021-07-08 DIAGNOSIS — M5117 Intervertebral disc disorders with radiculopathy, lumbosacral region: Secondary | ICD-10-CM | POA: Diagnosis not present

## 2021-07-08 DIAGNOSIS — M25652 Stiffness of left hip, not elsewhere classified: Secondary | ICD-10-CM | POA: Diagnosis not present

## 2021-07-08 DIAGNOSIS — M25651 Stiffness of right hip, not elsewhere classified: Secondary | ICD-10-CM | POA: Diagnosis not present

## 2021-07-10 DIAGNOSIS — R262 Difficulty in walking, not elsewhere classified: Secondary | ICD-10-CM | POA: Diagnosis not present

## 2021-07-10 DIAGNOSIS — M5117 Intervertebral disc disorders with radiculopathy, lumbosacral region: Secondary | ICD-10-CM | POA: Diagnosis not present

## 2021-07-10 DIAGNOSIS — M25652 Stiffness of left hip, not elsewhere classified: Secondary | ICD-10-CM | POA: Diagnosis not present

## 2021-07-10 DIAGNOSIS — M25651 Stiffness of right hip, not elsewhere classified: Secondary | ICD-10-CM | POA: Diagnosis not present

## 2021-07-13 ENCOUNTER — Other Ambulatory Visit: Payer: Self-pay | Admitting: Cardiology

## 2021-07-14 ENCOUNTER — Other Ambulatory Visit: Payer: Self-pay | Admitting: Neurological Surgery

## 2021-07-14 ENCOUNTER — Other Ambulatory Visit: Payer: Self-pay | Admitting: *Deleted

## 2021-07-14 DIAGNOSIS — M5416 Radiculopathy, lumbar region: Secondary | ICD-10-CM

## 2021-07-14 MED ORDER — ROSUVASTATIN CALCIUM 10 MG PO TABS: 10.0000 mg | ORAL_TABLET | Freq: Every day | ORAL | 0 refills | Status: DC

## 2021-07-15 DIAGNOSIS — M25652 Stiffness of left hip, not elsewhere classified: Secondary | ICD-10-CM | POA: Diagnosis not present

## 2021-07-15 DIAGNOSIS — M25651 Stiffness of right hip, not elsewhere classified: Secondary | ICD-10-CM | POA: Diagnosis not present

## 2021-07-15 DIAGNOSIS — R262 Difficulty in walking, not elsewhere classified: Secondary | ICD-10-CM | POA: Diagnosis not present

## 2021-07-15 DIAGNOSIS — M5117 Intervertebral disc disorders with radiculopathy, lumbosacral region: Secondary | ICD-10-CM | POA: Diagnosis not present

## 2021-07-17 DIAGNOSIS — M5117 Intervertebral disc disorders with radiculopathy, lumbosacral region: Secondary | ICD-10-CM | POA: Diagnosis not present

## 2021-07-17 DIAGNOSIS — M25651 Stiffness of right hip, not elsewhere classified: Secondary | ICD-10-CM | POA: Diagnosis not present

## 2021-07-17 DIAGNOSIS — M25652 Stiffness of left hip, not elsewhere classified: Secondary | ICD-10-CM | POA: Diagnosis not present

## 2021-07-17 DIAGNOSIS — R262 Difficulty in walking, not elsewhere classified: Secondary | ICD-10-CM | POA: Diagnosis not present

## 2021-07-18 ENCOUNTER — Encounter (HOSPITAL_BASED_OUTPATIENT_CLINIC_OR_DEPARTMENT_OTHER): Payer: PPO | Admitting: Internal Medicine

## 2021-07-18 ENCOUNTER — Other Ambulatory Visit: Payer: Self-pay

## 2021-07-18 DIAGNOSIS — L97522 Non-pressure chronic ulcer of other part of left foot with fat layer exposed: Secondary | ICD-10-CM | POA: Diagnosis not present

## 2021-07-18 DIAGNOSIS — M545 Low back pain, unspecified: Secondary | ICD-10-CM | POA: Diagnosis not present

## 2021-07-18 DIAGNOSIS — Z955 Presence of coronary angioplasty implant and graft: Secondary | ICD-10-CM

## 2021-07-18 DIAGNOSIS — E11621 Type 2 diabetes mellitus with foot ulcer: Secondary | ICD-10-CM | POA: Diagnosis not present

## 2021-07-18 NOTE — Progress Notes (Signed)
JAHMEEK, SHIRK ([id number]) ?Visit Report for 07/18/2021 ?Chief Complaint Document Details ?Patient Name: Date of Service: ?Alex Jackson, [place] VQQV W. 07/18/2021 8:45 A M ?Medical Record Number: [id number] ?Patient Account Number: [id number] ?Date of Birth/Sex: Treating RN: ?[birth year] (76 y.o. M) ?Primary Care Provider: [name] Other Clinician: ?Referring Provider: ?Treating Provider/Extender: Geralyn Corwin ?[name] ?Weeks in Treatment: 7 ?Information Obtained from: Patient ?Chief Complaint ?Left great toe wound ?Electronic Signature(s) ?Signed: 07/18/2021 10:09:54 AM By: Geralyn Corwin DO ?Entered By: Geralyn Corwin on 07/18/2021 09:54:28 ?-------------------------------------------------------------------------------- ?HPI Details ?Patient Name: Date of Service: ?Alex Jackson, [place] PPIR W. 07/18/2021 8:45 A M ?Medical Record Number: [id number] ?Patient Account Number: [id number] ?Date of Birth/Sex: Treating RN: ?1945-06-26 (76 y.o. M) ?Primary Care Provider: [name] Other Clinician: ?Referring Provider: ?Treating Provider/Extender: Geralyn Corwin ?[name] ?Weeks in Treatment: 7 ?History of Present Illness ?HPI Description: Admission 05/26/2021 ?Mr. Alex Jackson is a 76 year old male with a past medical history of controlled type 2 diabetes on oral agents, chronic back pain and coronary artery ?disease status post stenting to the LAD that presents to the clinic for a 75-month history of nonhealing wound to the left great toe plantar aspect. He has ?peripheral neuropathy and is not sure how the wound developed. He has been following with Dr. Victorino Dike for this issue and has been using Betadine to the area ?daily. He does not offload the area. He denies signs of infection. ?2/3; patient presents for follow-up. He obtained his left foot x-ray. He has been using gentamicin with hydrofera blue toe to the wound bed with dressing ?changes. He has no issues or complaints today. He denies signs of  infection. ?2/17; patient presents for follow-up. He had his MRI that showed bone marrow edema at the distal aspect concerning for osteomyelitis. Currently patient denies ?signs of infection. He feels well and has been using gentamicin with Hydrofera Blue to the wound bed. He states that he has ordered custom diabetic shoes. ?He obtains these at the end of the month. ?3/3; patient presents for follow-up. He had a scheduled appointment with Dr. Orvan Falconer, infectious disease yesterday. Patient canceled and at this time does ?not want to follow-up with any other doctors for this issue. He currently denies signs of infection. He has been using gentamicin ointment with Hydrofera Blue ?to the wound bed. He has orthopedic shoes and has an area that offloads the left great toe. ?3/17; patient presents for follow-up. He reports stubbing his left great toe that has the wound. He has been using gentamicin ointment and Hydrofera Blue with ?dressing changes. He has orthopedic shoes that offload the left great toe however he is not wearing them today and he confirms that he does not wear these ?every day. ?Electronic Signature(s) ?Signed: 07/18/2021 10:09:54 AM By: Geralyn Corwin DO ?Entered By: Geralyn Corwin on 07/18/2021 09:55:32 ?-------------------------------------------------------------------------------- ?Physical Exam Details ?Patient Name: Date of Service: ?Alex [place], [place] YTKZ W. 07/18/2021 8:45 A M ?Medical Record Number: [id number] ?Patient Account Number: [id number] ?Date of Birth/Sex: Treating RN: ?01-Nov-1945 (76 y.o. M) ?Primary Care Provider: [name] Other Clinician: ?Referring Provider: ?Treating Provider/Extender: Geralyn Corwin ?[name] ?Weeks in Treatment: 7 ?Constitutional ?respirations regular, non-labored and within target range for patient.Marland Kitchen ?Cardiovascular ?2+ dorsalis pedis/posterior tibialis pulses. ?Psychiatric ?pleasant and cooperative. ?Notes ?Left great toe: T the plantar aspect there  is an open wound with granulation tissue throughout and circumferential undermining. No signs of infection. ?o ?Electronic Signature(s) ?Signed: 07/18/2021 10:09:54 AM By: Geralyn Corwin DO ?Entered By: Alex Jackson,  Alex Jackson on 07/18/2021 09:56:15 ?-------------------------------------------------------------------------------- ?Physician Orders Details ?Patient Name: Date of Service: ?Alex [place], [place] ZLDJ W. 07/18/2021 8:45 A M ?Medical Record Number: [id number] ?Patient Account Number: [id number] ?Date of Birth/Sex: Treating RN: ?1946/02/26 (76 y.o. Lytle Michaels ?Primary Care Provider: [name] Other Clinician: ?Referring Provider: ?Treating Provider/Extender: Geralyn Corwin ?[name] ?Weeks in Treatment: 7 ?Verbal / Phone Orders: No ?Diagnosis Coding ?ICD-10 Coding ?Code Description ?Q30.092 Non-pressure chronic ulcer of other part of left foot with fat layer exposed ?E11.621 Type 2 diabetes mellitus with foot ulcer ?Z95.5 Presence of coronary angioplasty implant and graft ?E78.5 Hyperlipidemia, unspecified ?M54.50 Low back pain, unspecified ?Follow-up Appointments ?ppointment in 1 week. - Dr. Mikey Jackson Alex Jackson, Room 7) ?Return A ?Other: - [employer]- wound care supplier ?Bathing/ Shower/ Hygiene ?May shower and wash wound with soap and water. - with dressing changes. ?Edema Control - Lymphedema / SCD / Other ?Elevate legs to the level of the heart or above for 30 minutes daily and/or when sitting, a frequency of: - 3-4 times a day throughout the day. ?Avoid standing for long periods of time. ?Moisturize legs daily. - every night before bed. ?Off-Loading ?Other: - Continue to wear Diabetic shoes ?Wound Treatment ?Wound #1 - T Great ?oe Wound Laterality: Left ?Cleanser: Soap and Water 1 x Per Day/30 Days ?Discharge Instructions: May shower and wash wound with dial antibacterial soap and water prior to dressing change. ?Topical: Gentamicin 1 x Per Day/30 Days ?Discharge Instructions: As directed by  physician ?Prim Dressing: Hydrofera Blue Ready Foam, 2.5 x2.5 in 1 x Per Day/30 Days ?ary ?Discharge Instructions: Apply to wound bed as instructed ?Secondary Dressing: Woven Gauze Sponges 2x2 in 1 x Per Day/30 Days ?Discharge Instructions: Apply over primary dressing and under the toe rolled gauze as directed. ?Secondary Dressing: Optifoam Non-Adhesive Dressing, 4x4 in 1 x Per Day/30 Days ?Discharge Instructions: Cut to make foam donut ?Secured With: Insurance underwriter, Sterile 2x75 (in/in) 1 x Per Day/30 Days ?Discharge Instructions: Secure with stretch gauze as directed. ?Secured With: 24M Medipore H Soft Cloth Surgical T ape, 4 x 10 (in/yd) 1 x Per Day/30 Days ?Discharge Instructions: Secure with tape as directed. ?Electronic Signature(s) ?Signed: 07/18/2021 10:09:54 AM By: Geralyn Corwin DO ?Entered By: Geralyn Corwin on 07/18/2021 09:56:50 ?-------------------------------------------------------------------------------- ?Problem List Details ?Patient Name: ?Date of Service: ?Alex [place], [place] ZRAQ W. 07/18/2021 8:45 A M ?Medical Record Number: [id number] ?Patient Account Number: [id number] ?Date of Birth/Sex: ?Treating RN: ?28-Aug-1945 (76 y.o. 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 05/26/2021 No Yes ?E11.621 Type 2 diabetes mellitus with foot ulcer 05/26/2021 No Yes ?Z95.5 Presence of coronary angioplasty implant and graft 05/26/2021 No Yes ?E78.5 Hyperlipidemia, unspecified 05/26/2021 No Yes ?M54.50 Low back pain, unspecified 05/26/2021 No Yes ?Inactive Problems ?Resolved Problems ?Electronic Signature(s) ?Signed: 07/18/2021 10:09:54 AM By: Geralyn Corwin DO ?Entered By: Geralyn Corwin on 07/18/2021  09:54:09 ?-------------------------------------------------------------------------------- ?Progress Note Details ?Patient Name: ?Date of Service: ?Alex [place], [place] LSLH W. 07/18/2021 8:45 A M ?Medical Record Number: [id number]

## 2021-07-18 NOTE — Progress Notes (Signed)
Alex, Jackson ([id number]) ?Visit Report for 07/18/2021 ?Arrival Information Details ?Patient Name: Date of Service: ?Alex Jackson, [place] Alex W. 07/18/2021 8:45 A M ?Medical Record Number: [id number] ?Patient Account Number: [id number] ?Date of Birth/Sex: Treating RN: ?[birth year] (76 y.o. M) Rolin Barry, Bobbi ?Primary Care Dawit Tankard: London Pepper Other Clinician: ?Referring Reace Breshears: ?Treating Martasia Talamante/Extender: Kalman Shan ?London Pepper ?Weeks in Treatment: 7 ?Visit Information History Since Last Visit ?Added or deleted any medications: No ?Patient Arrived: Alex Jackson ?Any new allergies or adverse reactions: No ?Arrival Time: 08:59 ?Had a fall or experienced change in No ?Accompanied By: self ?activities of daily living that may affect ?Transfer Assistance: None ?risk of falls: ?Patient Identification Verified: Yes ?Signs or symptoms of abuse/neglect since last visito No ?Secondary Verification Process Completed: Yes ?Hospitalized since last visit: No ?Patient Requires Transmission-Based Precautions: No ?Implantable device outside of the clinic excluding No ?Patient Has Alerts: Yes ?cellular tissue based products placed in the center ?Patient Alerts: 11/18/20 ABI L1.04 since last visit: ?11/18/20 TBI L 1.35 Has Dressing in Place as Prescribed: Yes ?Pain Present Now: No ?Electronic Signature(s) ?Signed: 07/18/2021 12:54:07 PM By: Deon Pilling RN, BSN ?Entered By: Deon Pilling on 07/18/2021 08:59:56 ?-------------------------------------------------------------------------------- ?Clinic Level of Care Assessment Details ?Patient Name: Date of Service: ?Alex Jackson, [place] Alex W. 07/18/2021 8:45 A M ?Medical Record Number: [id number] ?Patient Account Number: [id number] ?Date of Birth/Sex: Treating RN: ?1946-02-21 (76 y.o. Alex Jackson ?Primary Care Yolette Hastings: London Pepper Other Clinician: ?Referring Cyrus Ramsburg: ?Treating Kaidyn Hernandes/Extender: Kalman Shan ?London Pepper ?Weeks in Treatment: 7 ?Clinic Level of Care Assessment  Items ?TOOL 4 Quantity Score ?X- 1 0 ?Use when only an EandM is performed on FOLLOW-UP visit ?ASSESSMENTS - Nursing Assessment / Reassessment ?X- 1 10 ?Reassessment of Co-morbidities (includes updates in patient status) ?X- 1 5 ?Reassessment of Adherence to Treatment Plan ?ASSESSMENTS - Wound and Skin A ssessment / Reassessment ?X - Simple Wound Assessment / Reassessment - one wound 1 5 ?[]  - 0 ?Complex Wound Assessment / Reassessment - multiple wounds ?[]  - 0 ?Dermatologic / Skin Assessment (not related to wound area) ?ASSESSMENTS - Focused Assessment ?[]  - 0 ?Circumferential Edema Measurements - multi extremities ?[]  - 0 ?Nutritional Assessment / Counseling / Intervention ?[]  - 0 ?Lower Extremity Assessment (monofilament, tuning fork, pulses) ?[]  - 0 ?Peripheral Arterial Disease Assessment (using hand held doppler) ?ASSESSMENTS - Ostomy and/or Continence Assessment and Care ?[]  - 0 ?Incontinence Assessment and Management ?[]  - 0 ?Ostomy Care Assessment and Management (repouching, etc.) ?PROCESS - Coordination of Care ?[]  - 0 ?Simple Patient / Family Education for ongoing care ?X- 1 20 ?Complex (extensive) Patient / Family Education for ongoing care ?X- 1 10 ?Staff obtains Consents, Records, T Results / Process Orders ?est ?[]  - 0 ?Staff telephones HHA, Nursing Homes / Clarify orders / etc ?[]  - 0 ?Routine Transfer to another Facility (non-emergent condition) ?[]  - 0 ?Routine Hospital Admission (non-emergent condition) ?[]  - 0 ?New Admissions / Biomedical engineer / Ordering NPWT Apligraf, etc. ?, ?[]  - 0 ?Emergency Hospital Admission (emergent condition) ?[]  - 0 ?Simple Discharge Coordination ?[]  - 0 ?Complex (extensive) Discharge Coordination ?PROCESS - Special Needs ?[]  - 0 ?Pediatric / Minor Patient Management ?[]  - 0 ?Isolation Patient Management ?[]  - 0 ?Hearing / Language / Visual special needs ?[]  - 0 ?Assessment of Community assistance (transportation, D/C planning, etc.) ?[]  - 0 ?Additional  assistance / Altered mentation ?[]  - 0 ?Support Surface(s) Assessment (bed, cushion, seat, etc.) ?INTERVENTIONS - Wound Cleansing / Measurement ?X - Simple Wound Cleansing -  one wound 1 5 ?[]  - 0 ?Complex Wound Cleansing - multiple wounds ?X- 1 5 ?Wound Imaging (photographs - any number of wounds) ?[]  - 0 ?Wound Tracing (instead of photographs) ?X- 1 5 ?Simple Wound Measurement - one wound ?[]  - 0 ?Complex Wound Measurement - multiple wounds ?INTERVENTIONS - Wound Dressings ?X - Small Wound Dressing one or multiple wounds 1 10 ?[]  - 0 ?Medium Wound Dressing one or multiple wounds ?[]  - 0 ?Large Wound Dressing one or multiple wounds ?[]  - 0 ?Application of Medications - topical ?[]  - 0 ?Application of Medications - injection ?INTERVENTIONS - Miscellaneous ?[]  - 0 ?External ear exam ?[]  - 0 ?Specimen Collection (cultures, biopsies, blood, body fluids, etc.) ?[]  - 0 ?Specimen(s) / Culture(s) sent or taken to Lab for analysis ?[]  - 0 ?Patient Transfer (multiple staff / Civil Service fast streamer / Similar devices) ?[]  - 0 ?Simple Staple / Suture removal (25 or less) ?[]  - 0 ?Complex Staple / Suture removal (26 or more) ?[]  - 0 ?Hypo / Hyperglycemic Management (close monitor of Blood Glucose) ?[]  - 0 ?Ankle / Brachial Index (ABI) - do not check if billed separately ?X- 1 5 ?Vital Signs ?Has the patient been seen at the hospital within the last three years: Yes ?Total Score: 80 ?Level Of Care: New/Established - Level 3 ?Electronic Signature(s) ?Signed: 07/18/2021 1:03:11 PM By: Lorrin Salman ?Entered By: Lorrin Frerichs on 07/18/2021 09:24:35 ?-------------------------------------------------------------------------------- ?Encounter Discharge Information Details ?Patient Name: Date of Service: ?Alex Jackson, [place] Alex W. 07/18/2021 8:45 A M ?Medical Record Number: [id number] ?Patient Account Number: [id number] ?Date of Birth/Sex: Treating RN: ?31-May-1945 (76 y.o. Alex Jackson ?Primary Care Paxson Harrower: London Pepper Other Clinician: ?Referring  Thu Baggett: ?Treating Jashad Depaula/Extender: Kalman Shan ?[facility] Pepper ?Weeks in Treatment: 7 ?Encounter Discharge Information Items ?Discharge Condition: Stable ?Ambulatory Status: Alex Jackson ?Discharge Destination: Home ?Transportation: Private Auto ?Schedule Follow-up Appointment: Yes ?Clinical Summary of Care: Provided on 07/18/2021 ?Form Type Recipient ?Paper Patient Patient ?Electronic Signature(s) ?Signed: 07/18/2021 1:03:11 PM By: Lorrin Blackwell ?Entered By: Lorrin Burby on 07/18/2021 09:38:16 ?-------------------------------------------------------------------------------- ?Lower Extremity Assessment Details ?Patient Name: Date of Service: ?Alex Jackson, [place] Alex W. 07/18/2021 8:45 A M ?Medical Record Number: [id number] ?Patient Account Number: [id number] ?Date of Birth/Sex: Treating RN: ?[birth year] (76 y.o. M) Rolin Barry, Bobbi ?Primary Care Cherl Gorney: London Pepper Other Clinician: ?Referring Adyn Serna: ?Treating Nevada Mullett/Extender: Kalman Shan ?London Pepper ?Weeks in Treatment: 7 ?Edema Assessment ?Assessed: [Left: Yes] [Right: No] ?Edema: [Left: N] [Right: o] ?Calf ?Left: Right: ?Point of Measurement: 36 cm From Medial Instep 38 cm ?Ankle ?Left: Right: ?Point of Measurement: 12 cm From Medial Instep 22 cm ?Vascular Assessment ?Pulses: ?Dorsalis Pedis ?Palpable: [Left:Yes] ?Electronic Signature(s) ?Signed: 07/18/2021 12:54:07 PM By: Deon Pilling RN, BSN ?Entered By: Deon Pilling on 07/18/2021 09:01:53 ?-------------------------------------------------------------------------------- ?Multi Wound Chart Details ?Patient Name: ?Date of Service: ?Alex Jackson, [place] Alex W. 07/18/2021 8:45 A M ?Medical Record Number: [id number] ?Patient Account Number: [id number] ?Date of Birth/Sex: ?Treating RN: ?[birth year] (76 y.o. M) ?Primary Care Shakema Surita: London Pepper ?Other Clinician: ?Referring Robinson Brinkley: ?Treating Drucilla Cumber/Extender: Kalman Shan ?London Pepper ?Weeks in Treatment: 7 ?Vital Signs ?Height(in): 71 ?Pulse(bpm):  76 ?Weight(lbs): 252 ?Blood Pressure(mmHg): 163/89 ?Body Mass Index(BMI): 35.1 ?Temperature(??F): 98.4 ?Respiratory Rate(breaths/min): 20 ?Photos: [N/A:N/A] ?Left T Great ?oe N/A N/A ?Wound Location: ?Blister N/A N/A ?Wou

## 2021-07-22 DIAGNOSIS — M25651 Stiffness of right hip, not elsewhere classified: Secondary | ICD-10-CM | POA: Diagnosis not present

## 2021-07-22 DIAGNOSIS — M5117 Intervertebral disc disorders with radiculopathy, lumbosacral region: Secondary | ICD-10-CM | POA: Diagnosis not present

## 2021-07-22 DIAGNOSIS — M25652 Stiffness of left hip, not elsewhere classified: Secondary | ICD-10-CM | POA: Diagnosis not present

## 2021-07-22 DIAGNOSIS — R262 Difficulty in walking, not elsewhere classified: Secondary | ICD-10-CM | POA: Diagnosis not present

## 2021-07-23 ENCOUNTER — Other Ambulatory Visit: Payer: Self-pay

## 2021-07-23 MED ORDER — LISINOPRIL 40 MG PO TABS: 40.0000 mg | ORAL_TABLET | Freq: Every day | ORAL | 0 refills | Status: DC

## 2021-07-23 MED ORDER — PANTOPRAZOLE SODIUM 40 MG PO TBEC: 40.0000 mg | DELAYED_RELEASE_TABLET | Freq: Every day | ORAL | 0 refills | Status: DC

## 2021-07-23 MED ORDER — CHLORTHALIDONE 25 MG PO TABS: 25.0000 mg | ORAL_TABLET | Freq: Every day | ORAL | 0 refills | Status: DC

## 2021-07-24 DIAGNOSIS — M25652 Stiffness of left hip, not elsewhere classified: Secondary | ICD-10-CM | POA: Diagnosis not present

## 2021-07-24 DIAGNOSIS — M25651 Stiffness of right hip, not elsewhere classified: Secondary | ICD-10-CM | POA: Diagnosis not present

## 2021-07-24 DIAGNOSIS — R262 Difficulty in walking, not elsewhere classified: Secondary | ICD-10-CM | POA: Diagnosis not present

## 2021-07-24 DIAGNOSIS — M5117 Intervertebral disc disorders with radiculopathy, lumbosacral region: Secondary | ICD-10-CM | POA: Diagnosis not present

## 2021-07-28 ENCOUNTER — Encounter (HOSPITAL_BASED_OUTPATIENT_CLINIC_OR_DEPARTMENT_OTHER): Payer: PPO | Admitting: Internal Medicine

## 2021-07-28 ENCOUNTER — Other Ambulatory Visit: Payer: Self-pay

## 2021-07-28 DIAGNOSIS — M25651 Stiffness of right hip, not elsewhere classified: Secondary | ICD-10-CM | POA: Diagnosis not present

## 2021-07-28 DIAGNOSIS — E11621 Type 2 diabetes mellitus with foot ulcer: Secondary | ICD-10-CM | POA: Diagnosis not present

## 2021-07-28 DIAGNOSIS — M5117 Intervertebral disc disorders with radiculopathy, lumbosacral region: Secondary | ICD-10-CM | POA: Diagnosis not present

## 2021-07-28 DIAGNOSIS — L97522 Non-pressure chronic ulcer of other part of left foot with fat layer exposed: Secondary | ICD-10-CM | POA: Diagnosis not present

## 2021-07-28 DIAGNOSIS — R262 Difficulty in walking, not elsewhere classified: Secondary | ICD-10-CM | POA: Diagnosis not present

## 2021-07-28 DIAGNOSIS — M25652 Stiffness of left hip, not elsewhere classified: Secondary | ICD-10-CM | POA: Diagnosis not present

## 2021-07-28 NOTE — Progress Notes (Signed)
SILVESTER, REIERSON ([id number]) ?Visit Report for 07/28/2021 ?Chief Complaint Document Details ?Patient Name: Date of Service: ?Alex Jackson, [place] JQ[facility] W. 07/28/2021 8:45 A M ?Medical Record Number: [id number] ?Patient Account Number: [id number] ?Date of Birth/Sex: Treating RN: ?December 22, 1945 (76 y.o. M) ?Primary Care Provider: [name] Other Clinician: ?Referring Provider: ?Treating Provider/Extender: Geralyn Corwin ?[name] ?Weeks in Treatment: 9 ?Information Obtained from: Patient ?Chief Complaint ?Left great toe wound ?Electronic Signature(s) ?Signed: 07/28/2021 9:39:53 AM By: Geralyn Corwin DO ?Entered By: Geralyn Corwin on 07/28/2021 09:34:19 ?-------------------------------------------------------------------------------- ?Debridement Details ?Patient Name: Date of Service: ?Alex [place], [place] OXBD W. 07/28/2021 8:45 A M ?Medical Record Number: [id number] ?Patient Account Number: [id number] ?Date of Birth/Sex: Treating RN: ?[birth year] (76 y.o. Lytle Michaels ?Primary Care Provider: [name] Other Clinician: ?Referring Provider: ?Treating Provider/Extender: Geralyn Corwin ?[name] ?Weeks in Treatment: 9 ?Debridement Performed for Assessment: Wound #1 Left T Great ?oe ?Performed By: Physician Geralyn Corwin, DO ?Debridement Type: Debridement ?Severity of Tissue Pre Debridement: Fat layer exposed ?Level of Consciousness (Pre-procedure): Awake and Alert ?Pre-procedure Verification/Time Out Yes - 09:15 ?Taken: ?Start Time: 09:16 ?Pain Control: ?Other : Benzocaine ?T Area Debrided (L x W): ?otal 1 (cm) x 1 (cm) = 1 (cm?) ?Tissue and other material debrided: Non-Viable, Callus, Slough, Subcutaneous, Slough ?Level: Skin/Subcutaneous Tissue ?Debridement Description: Excisional ?Instrument: Curette ?Bleeding: Minimum ?Hemostasis Achieved: Pressure ?End Time: 09:21 ?Response to Treatment: Procedure was tolerated well ?Level of Consciousness (Post- Awake and Alert ?procedure): ?Post Debridement Measurements  of Total Wound ?Length: (cm) 1 ?Width: (cm) 1 ?Depth: (cm) 0.3 ?Volume: (cm?) 0.236 ?Character of Wound/Ulcer Post Debridement: Stable ?Severity of Tissue Post Debridement: Fat layer exposed ?Post Procedure Diagnosis ?Same as Pre-procedure ?Electronic Signature(s) ?Signed: 07/28/2021 9:39:53 AM By: Geralyn Corwin DO ?Signed: 07/28/2021 4:27:43 PM By: Antonieta Iba ?Entered By: Antonieta Iba on 07/28/2021 09:25:01 ?-------------------------------------------------------------------------------- ?HPI Details ?Patient Name: Date of Service: ?Alex [place], [place] [facility] W. 07/28/2021 8:45 A M ?Medical Record Number: [id number] ?Patient Account Number: [id number] ?Date of Birth/Sex: Treating RN: ?11-22-1945 (76 y.o. M) ?Primary Care Provider: [name] Other Clinician: ?Referring Provider: ?Treating Provider/Extender: Geralyn Corwin ?[name] ?Weeks in Treatment: 9 ?History of Present Illness ?HPI Description: Admission 05/26/2021 ?[name] is a 76 year old male with a past medical history of controlled type 2 diabetes on oral agents, chronic back pain and coronary artery ?disease status post stenting to the LAD that presents to the clinic for a 28-month history of nonhealing wound to the left great toe plantar aspect. He has ?peripheral neuropathy and is not sure how the wound developed. He has been following with Dr. Victorino Dike for this issue and has been using Betadine to the area ?daily. He does not offload the area. He denies signs of infection. ?2/3; patient presents for follow-up. He obtained his left foot x-ray. He has been using gentamicin with hydrofera blue toe to the wound bed with dressing ?changes. He has no issues or complaints today. He denies signs of infection. ?2/17; patient presents for follow-up. He had his MRI that showed bone marrow edema at the distal aspect concerning for osteomyelitis. Currently patient denies ?signs of infection. He feels well and has been using gentamicin with Hydrofera  Blue to the wound bed. He states that he has ordered custom diabetic shoes. ?He obtains these at the end of the month. ?3/3; patient presents for follow-up. He had a scheduled appointment with Dr. Orvan Falconer, infectious disease yesterday. Patient canceled and at this time does ?not want to follow-up with any other  doctors for this issue. He currently denies signs of infection. He has been using gentamicin ointment with Hydrofera Blue ?to the wound bed. He has orthopedic shoes and has an area that offloads the left great toe. ?3/17; patient presents for follow-up. He reports stubbing his left great toe that has the wound. He has been using gentamicin ointment and Hydrofera Blue with ?dressing changes. He has orthopedic shoes that offload the left great toe however he is not wearing them today and he confirms that he does not wear these ?every day. ?3/27; patient presents for follow-up. He has his surgical shoe with a cut out to the left great toe. Unfortunately his foot is shifting in the shoe and pressure ?continues as noted by the drainage location. He continues to use gentamicin ointment with Hydrofera Blue. He denies signs of infection. He reports less ?drainage to the wound site. ?Electronic Signature(s) ?Signed: 07/28/2021 9:39:53 AM By: Geralyn Corwin DO ?Entered By: Geralyn Corwin on 07/28/2021 09:37:11 ?-------------------------------------------------------------------------------- ?Physical Exam Details ?Patient Name: Date of Service: ?Alex [place], [place] MPNT W. 07/28/2021 8:45 A M ?Medical Record Number: [id number] ?Patient Account Number: [id number] ?Date of Birth/Sex: Treating RN: ?Oct 25, 1945 (76 y.o. M) ?Primary Care Provider: [name] Other Clinician: ?Referring Provider: ?Treating Provider/Extender: Geralyn Corwin ?[name] ?Weeks in Treatment: 9 ?Constitutional ?respirations regular, non-labored and within target range for patient.Marland Kitchen ?Cardiovascular ?2+ dorsalis pedis/posterior tibialis  pulses. ?Psychiatric ?pleasant and cooperative. ?Notes ?Left great toe: T the plantar aspect there is an open wound with granulation tissue, nonviable tissue and circumferential callus. No signs of surrounding ?o ?infection. ?Electronic Signature(s) ?Signed: 07/28/2021 9:39:53 AM By: Geralyn Corwin DO ?Entered By: Geralyn Corwin on 07/28/2021 09:36:49 ?-------------------------------------------------------------------------------- ?Physician Orders Details ?Patient Name: Date of Service: ?Alex [place], [place] GQQP W. 07/28/2021 8:45 A M ?Medical Record Number: [id number] ?Patient Account Number: [id number] ?Date of Birth/Sex: Treating RN: ?1945/07/12 (76 y.o. Lytle Michaels ?Primary Care Provider: [name] Other Clinician: ?Referring Provider: ?Treating Provider/Extender: Geralyn Corwin ?[name] ?Weeks in Treatment: 9 ?Verbal / Phone Orders: No ?Diagnosis Coding ?ICD-10 Coding ?Code Description ?Z12.458 Non-pressure chronic ulcer of other part of left foot with fat layer exposed ?E11.621 Type 2 diabetes mellitus with foot ulcer ?Z95.5 Presence of coronary angioplasty implant and graft ?E78.5 Hyperlipidemia, unspecified ?M54.50 Low back pain, unspecified ?Follow-up Appointments ?ppointment in 1 week. - Friday 08/08/21 Dr. Mikey Bussing Lennox Laity, Room 7) ?Return A ?ppointment in 2 weeks. - 08/22/21 Dr. Mikey Bussing Lennox Laity, Room 7) ?Return A ?Other: - [employer]- wound care supplier ?Bathing/ Shower/ Hygiene ?May shower and wash wound with soap and water. - with dressing changes. ?Edema Control - Lymphedema / SCD / Other ?Elevate legs to the level of the heart or above for 30 minutes daily and/or when sitting, a frequency of: - 3-4 times a day throughout the day. ?Avoid standing for long periods of time. ?Moisturize legs daily. - every night before bed. ?Off-Loading ?Other: - Continue to wear Surgical Shoe with offloading felt ?Wound Treatment ?Wound #1 - T Great ?oe Wound Laterality: Left ?Cleanser: Soap and Water 1  x Per Day/30 Days ?Discharge Instructions: May shower and wash wound with dial antibacterial soap and water prior to dressing change. ?Topical: Gentamicin 1 x Per Day/30 Days ?Discharge Instructions: As direct

## 2021-07-28 NOTE — Progress Notes (Signed)
Alex, Jackson ([id number]) ?Visit Report for 07/28/2021 ?Arrival Information Details ?Patient Name: Date of Service: ?Alex Jackson, [place] Alex W. 07/28/2021 8:45 A M ?Medical Record Number: [id number] ?Patient Account Number: [id number] ?Date of Birth/Sex: Treating RN: ?1945/12/10 (76 y.o. Alex Jackson ?Primary Care Rohan Juenger: London Pepper Other Clinician: ?Referring Deleon Passe: ?Treating Kareem Cathey/Extender: Kalman Shan ?[facility] Pepper ?Weeks in Treatment: 9 ?Visit Information History Since Last Visit ?Added or deleted any medications: No ?Patient Arrived: Alex Jackson ?Any new allergies or adverse reactions: No ?Arrival Time: 08:48 ?Had a fall or experienced change in No ?Transfer Assistance: None ?activities of daily living that may affect ?Patient Identification Verified: Yes ?risk of falls: ?Secondary Verification Process Completed: Yes ?Signs or symptoms of abuse/neglect since No ?Patient Requires Transmission-Based Precautions: No last visito ?Patient Has Alerts: Yes ?Hospitalized since last visit: No ?Patient Alerts: 11/18/20 ABI L1.04 ?Implantable device outside of the clinic No ?11/18/20 TBI L 1.35 ?excluding ?cellular tissue based products placed in the ?center ?since last visit: ?Has Dressing in Place as Prescribed: Yes ?Has Footwear/Offloading in Place as Yes ?Prescribed: ?Left: Surgical Shoe with Pressure Relief Insole ?Pain Present Now: No ?Electronic Signature(s) ?Signed: 07/28/2021 4:27:43 PM By: Lorrin Rosetti ?Entered By: Lorrin Zaccaro on 07/28/2021 08:50:43 ?-------------------------------------------------------------------------------- ?Encounter Discharge Information Details ?Patient Name: Date of Service: ?Alex [place], [place] Alex W. 07/28/2021 8:45 A M ?Medical Record Number: [id number] ?Patient Account Number: [id number] ?Date of Birth/Sex: Treating RN: ?06-18-1945 (76 y.o. Alex Jackson ?Primary Care Ashon Rosenberg: London Pepper Other Clinician: ?Referring Alex Jackson: ?Treating Shaune Westfall/Extender: Kalman Shan ?London Pepper ?Weeks in Treatment: 9 ?Encounter Discharge Information Items Post Procedure Vitals ?Discharge Condition: Stable ?Temperature (F): 99.0 ?Ambulatory Status: Alex Jackson ?Pulse (bpm): 80 ?Discharge Destination: Home ?Respiratory Rate (breaths/min): 20 ?Transportation: Private Auto ?Blood Pressure (mmHg): 155/85 ?Schedule Follow-up Appointment: Yes ?Clinical Summary of Care: Provided on 07/28/2021 ?Form Type Recipient ?Paper Patient Patient ?Electronic Signature(s) ?Signed: 07/28/2021 4:27:43 PM By: Lorrin Schake ?Entered By: Lorrin Spratt on 07/28/2021 09:39:29 ?-------------------------------------------------------------------------------- ?Lower Extremity Assessment Details ?Patient Name: ?Date of Service: ?Alex [place], [place] Alex W. 07/28/2021 8:45 A M ?Medical Record Number: [id number] ?Patient Account Number: [id number] ?Date of Birth/Sex: ?Treating RN: ?Dec 01, 1945 (76 y.o. Alex Jackson ?Primary Care Naythen Heikkila: London Pepper ?Other Clinician: ?Referring Alex Jackson: ?Treating Rio Kidane/Extender: Kalman Shan ?London Pepper ?Weeks in Treatment: 9 ?Edema Assessment ?Assessed: [Left: Yes] [Right: No] ?Edema: [Left: N] [Right: o] ?Calf ?Left: Right: ?Point of Measurement: 36 cm From Medial Instep 39.5 cm ?Ankle ?Left: Right: ?Point of Measurement: 12 cm From Medial Instep 24 cm ?Vascular Assessment ?Pulses: ?Dorsalis Pedis ?Palpable: [Left:Yes] ?Electronic Signature(s) ?Signed: 07/28/2021 4:27:43 PM By: Lorrin Tramble ?Entered By: Lorrin Bedgood on 07/28/2021 08:57:22 ?-------------------------------------------------------------------------------- ?Multi Wound Chart Details ?Patient Name: ?Date of Service: ?Alex [place], [place] Alex W. 07/28/2021 8:45 A M ?Medical Record Number: [id number] ?Patient Account Number: [id number] ?Date of Birth/Sex: ?Treating RN: ?Jul 25, 1945 (76 y.o. M) ?Primary Care Chirstina Haan: London Pepper ?Other Clinician: ?Referring Alex Jackson: ?Treating Alene Bergerson/Extender: Kalman Shan ?London Pepper ?Weeks in Treatment: 9 ?Vital Signs ?Height(in): 71 ?Pulse(bpm): 80 ?Weight(lbs): 252 ?Blood Pressure(mmHg): 155/85 ?Body Mass Index(BMI): 35.1 ?Temperature(??F): 99.0 ?Respiratory Rate(breaths/min): 20 ?Photos: [1:Left Darien Ramus oe] [N/A:N/A N/A] ?Wound Location: [1:Blister] [N/A:N/A] ?Wounding Event: [1:Diabetic Wound/Ulcer of the Lower] [N/A:N/A] ?Primary Etiology: [1:Extremity Cataracts, Coronary Artery Disease, N/A] ?Comorbid History: [1:Hypertension, Myocardial Infarction, Type II Diabetes 10/02/2020] [N/A:N/A] ?Date Acquired: [1:9] [N/A:N/A] ?Weeks of Treatment: [1:Open] [N/A:N/A] ?Wound Status: [1:No] [N/A:N/A] ?Wound Recurrence: [1:1x1x0.3] [N/A:N/A] ?Measurements L x W x D (cm) [1:0.785] [N/A:N/A] ?A (cm?) : ?rea [1:0.236] [  N/A:N/A] ?Volume (cm?) : [1:-177.40%] [N/A:N/A] ?% Reduction in A [1:rea: -108.80%] [N/A:N/A] ?% Reduction in Volume: [1:7] ?Starting Position 1 (o'clock): [1:10] ?Ending Position 1 (o'clock): [1:0.3] ?Maximum Distance 1 (cm): [1:Yes] [N/A:N/A] ?Undermining: [1:Grade 3] [N/A:N/A] ?Classification: [1:Medium] [N/A:N/A] ?Exudate A mount: [1:Serosanguineous] [N/A:N/A] ?Exudate Type: [1:red, brown] [N/A:N/A] ?Exudate Color: [1:Well defined, not attached] [N/A:N/A] ?Wound Margin: [1:Large (67-100%)] [N/A:N/A] ?Granulation A mount: [1:Red] [N/A:N/A] ?Granulation Quality: [1:Small (1-33%)] [N/A:N/A] ?Necrotic A mount: ?[1:Fat Layer (Subcutaneous Tissue): Yes N/A] ?Exposed Structures: ?[1:Fascia: No Tendon: No Muscle: No Joint: No Bone: No None] [N/A:N/A] ?Epithelialization: [1:Debridement - Excisional] [N/A:N/A] ?Debridement: ?Pre-procedure Verification/Time Out 09:15 [N/A:N/A] ?Taken: [1:Other] [N/A:N/A] ?Pain Control: [1:Callus, Subcutaneous, Slough] [N/A:N/A] ?Tissue Debrided: [1:Skin/Subcutaneous Tissue] [N/A:N/A] ?Level: [1:1] [N/A:N/A] ?Debridement A (sq cm): [1:rea Curette] [N/A:N/A] ?Instrument: [1:Minimum] [N/A:N/A] ?Bleeding: [1:Pressure] [N/A:N/A] ?Hemostasis A chieved:  [1:Procedure was tolerated well] [N/A:N/A] ?Debridement Treatment Response: [1:1x1x0.3] [N/A:N/A] ?Post Debridement Measurements L x ?W x D (cm) [1:0.236] [N/A:N/A] ?Post Debridement Volume: (cm?) [1:Debridement] [N/A:N/A] ?Treatment Notes ?Electronic Signature(s) ?Signed: 07/28/2021 9:39:53 AM By: Kalman Shan DO ?Entered By: Kalman Shan on 07/28/2021 09:34:10 ?-------------------------------------------------------------------------------- ?Multi-Disciplinary Care Plan Details ?Patient Name: ?Date of Service: ?Alex Jackson, [place] Alex W. 07/28/2021 8:45 A M ?Medical Record Number: [id number] ?Patient Account Number: [id number] ?Date of Birth/Sex: ?Treating RN: ?13-Jun-1945 (76 y.o. Alex Jackson ?Primary Care Duaine Radin: London Pepper ?Other Clinician: ?Referring Maliea Grandmaison: ?Treating Stefanie Hodgens/Extender: Kalman Shan ?[facility] Pepper ?Weeks in Treatment: 9 ?Multidisciplinary Care Plan reviewed with physician ?Active Inactive ?Nutrition ?Nursing Diagnoses: ?Potential for alteratiion in Nutrition/Potential for imbalanced nutrition ?Goals: ?Patient/caregiver agrees to and verbalizes understanding of need to obtain nutritional consultation ?Date Initiated: 05/26/2021 ?Date Inactivated: 06/06/2021 ?Target Resolution Date: 06/06/2021 ?Goal Status: Met ?Patient/caregiver will maintain therapeutic glucose control ?Date Initiated: 06/06/2021 ?Target Resolution Date: 08/08/2021 ?Goal Status: Active ?Interventions: ?Assess patient nutrition upon admission and as needed per policy ?Provide education on nutrition ?Treatment Activities: ?Education provided on Nutrition : 06/20/2021 ?Obtain HgA1c : 05/26/2021 ?Patient referred to Primary Care Physician for further nutritional evaluation : 05/26/2021 ?Notes: ?Wound/Skin Impairment ?Nursing Diagnoses: ?Knowledge deficit related to ulceration/compromised skin integrity ?Goals: ?Patient/caregiver will verbalize understanding of skin care regimen ?Date Initiated: 05/26/2021 ?Target Resolution  Date: 08/08/2021 ?Goal Status: Active ?Ulcer/skin breakdown will have a volume reduction of 50% by week 8 ?Date Initiated: 06/06/2021 ?Target Resolution Date: 08/08/2021 ?Goal Status: Active ?Interventions: ?Assess patient/caregi

## 2021-07-31 DIAGNOSIS — M25651 Stiffness of right hip, not elsewhere classified: Secondary | ICD-10-CM | POA: Diagnosis not present

## 2021-07-31 DIAGNOSIS — M25652 Stiffness of left hip, not elsewhere classified: Secondary | ICD-10-CM | POA: Diagnosis not present

## 2021-07-31 DIAGNOSIS — M5117 Intervertebral disc disorders with radiculopathy, lumbosacral region: Secondary | ICD-10-CM | POA: Diagnosis not present

## 2021-07-31 DIAGNOSIS — R262 Difficulty in walking, not elsewhere classified: Secondary | ICD-10-CM | POA: Diagnosis not present

## 2021-08-06 DIAGNOSIS — M25652 Stiffness of left hip, not elsewhere classified: Secondary | ICD-10-CM | POA: Diagnosis not present

## 2021-08-06 DIAGNOSIS — M5117 Intervertebral disc disorders with radiculopathy, lumbosacral region: Secondary | ICD-10-CM | POA: Diagnosis not present

## 2021-08-06 DIAGNOSIS — R262 Difficulty in walking, not elsewhere classified: Secondary | ICD-10-CM | POA: Diagnosis not present

## 2021-08-06 DIAGNOSIS — M25651 Stiffness of right hip, not elsewhere classified: Secondary | ICD-10-CM | POA: Diagnosis not present

## 2021-08-07 DIAGNOSIS — E1169 Type 2 diabetes mellitus with other specified complication: Secondary | ICD-10-CM | POA: Diagnosis not present

## 2021-08-07 DIAGNOSIS — E785 Hyperlipidemia, unspecified: Secondary | ICD-10-CM | POA: Diagnosis not present

## 2021-08-07 DIAGNOSIS — I10 Essential (primary) hypertension: Secondary | ICD-10-CM | POA: Diagnosis not present

## 2021-08-07 DIAGNOSIS — L97509 Non-pressure chronic ulcer of other part of unspecified foot with unspecified severity: Secondary | ICD-10-CM | POA: Diagnosis not present

## 2021-08-08 ENCOUNTER — Encounter (HOSPITAL_BASED_OUTPATIENT_CLINIC_OR_DEPARTMENT_OTHER): Payer: PPO | Admitting: Internal Medicine

## 2021-08-12 DIAGNOSIS — M25651 Stiffness of right hip, not elsewhere classified: Secondary | ICD-10-CM | POA: Diagnosis not present

## 2021-08-12 DIAGNOSIS — R262 Difficulty in walking, not elsewhere classified: Secondary | ICD-10-CM | POA: Diagnosis not present

## 2021-08-12 DIAGNOSIS — M5117 Intervertebral disc disorders with radiculopathy, lumbosacral region: Secondary | ICD-10-CM | POA: Diagnosis not present

## 2021-08-12 DIAGNOSIS — M25652 Stiffness of left hip, not elsewhere classified: Secondary | ICD-10-CM | POA: Diagnosis not present

## 2021-08-14 DIAGNOSIS — M5117 Intervertebral disc disorders with radiculopathy, lumbosacral region: Secondary | ICD-10-CM | POA: Diagnosis not present

## 2021-08-14 DIAGNOSIS — M25651 Stiffness of right hip, not elsewhere classified: Secondary | ICD-10-CM | POA: Diagnosis not present

## 2021-08-14 DIAGNOSIS — M25652 Stiffness of left hip, not elsewhere classified: Secondary | ICD-10-CM | POA: Diagnosis not present

## 2021-08-14 DIAGNOSIS — R262 Difficulty in walking, not elsewhere classified: Secondary | ICD-10-CM | POA: Diagnosis not present

## 2021-08-18 DIAGNOSIS — M5117 Intervertebral disc disorders with radiculopathy, lumbosacral region: Secondary | ICD-10-CM | POA: Diagnosis not present

## 2021-08-18 DIAGNOSIS — R262 Difficulty in walking, not elsewhere classified: Secondary | ICD-10-CM | POA: Diagnosis not present

## 2021-08-18 DIAGNOSIS — M25651 Stiffness of right hip, not elsewhere classified: Secondary | ICD-10-CM | POA: Diagnosis not present

## 2021-08-18 DIAGNOSIS — M25652 Stiffness of left hip, not elsewhere classified: Secondary | ICD-10-CM | POA: Diagnosis not present

## 2021-08-19 ENCOUNTER — Other Ambulatory Visit: Payer: Self-pay

## 2021-08-19 MED ORDER — ROSUVASTATIN CALCIUM 10 MG PO TABS: 10.0000 mg | ORAL_TABLET | Freq: Every day | ORAL | 0 refills | Status: DC

## 2021-08-22 ENCOUNTER — Encounter (HOSPITAL_BASED_OUTPATIENT_CLINIC_OR_DEPARTMENT_OTHER): Payer: PPO | Attending: Internal Medicine | Admitting: General Surgery

## 2021-08-22 DIAGNOSIS — L97522 Non-pressure chronic ulcer of other part of left foot with fat layer exposed: Secondary | ICD-10-CM | POA: Insufficient documentation

## 2021-08-22 DIAGNOSIS — Z955 Presence of coronary angioplasty implant and graft: Secondary | ICD-10-CM | POA: Diagnosis not present

## 2021-08-22 DIAGNOSIS — M545 Low back pain, unspecified: Secondary | ICD-10-CM | POA: Insufficient documentation

## 2021-08-22 DIAGNOSIS — E11621 Type 2 diabetes mellitus with foot ulcer: Secondary | ICD-10-CM | POA: Insufficient documentation

## 2021-08-22 DIAGNOSIS — E785 Hyperlipidemia, unspecified: Secondary | ICD-10-CM | POA: Diagnosis not present

## 2021-08-22 NOTE — Progress Notes (Signed)
STEN, DEMATTEO ([id number]) ?Visit Report for 08/22/2021 ?Arrival Information Details ?Patient Name: Date of Service: ?Sol Passer, [place] TKPT W. 08/22/2021 8:00 A M ?Medical Record Number: [id number] ?Patient Account Number: [id number] ?Date of Birth/Sex: Treating RN: ?1945/06/23 (76 y.o. Lytle Michaels ?Primary Care Zetta Stoneman: [name] Other Clinician: ?Referring Ayeden Gladman: ?Treating Kolbey Teichert/Extender: Geralyn Corwin ?[name] ?Weeks in Treatment: 12 ?Visit Information History Since Last Visit ?Added or deleted any medications: No ?Patient Arrived: Dan Humphreys ?Any new allergies or adverse reactions: No ?Arrival Time: 08:26 ?Had a fall or experienced change in No ?Transfer Assistance: None ?activities of daily living that may affect ?Patient Identification Verified: Yes ?risk of falls: ?Secondary Verification Process Completed: Yes ?Signs or symptoms of abuse/neglect since No ?Patient Requires Transmission-Based Precautions: No last visito ?Patient Has Alerts: Yes ?Hospitalized since last visit: No ?Patient Alerts: 11/18/20 ABI L1.04 ?Implantable device outside of the clinic No ?11/18/20 TBI L 1.35 ?excluding ?cellular tissue based products placed in the ?center ?since last visit: ?Has Dressing in Place as Prescribed: Yes ?Has Footwear/Offloading in Place as Yes ?Prescribed: ?Left: Surgical Shoe with Pressure Relief Insole ?Pain Present Now: No ?Electronic Signature(s) ?Signed: 08/22/2021 12:06:11 PM By: Antonieta Iba ?Entered By: Antonieta Iba on 08/22/2021 08:27:16 ?-------------------------------------------------------------------------------- ?Clinic Level of Care Assessment Details ?Patient Name: Date of Service: ?Sol Passer, [place] TZGY W. 08/22/2021 8:00 A M ?Medical Record Number: [id number] ?Patient Account Number: [id number] ?Date of Birth/Sex: Treating RN: ?[birth year] (76 y.o. Lytle Michaels ?Primary Care Mckinley Adelstein: [name] Other Clinician: ?Referring Montie Gelardi: ?Treating Marquet Faircloth/Extender: Geralyn Corwin ?[name] ?Weeks in Treatment: 12 ?Clinic Level of Care Assessment Items ?TOOL 4 Quantity Score ?X- 1 0 ?Use when only an EandM is performed on FOLLOW-UP visit ?ASSESSMENTS - Nursing Assessment / Reassessment ?X- 1 10 ?Reassessment of Co-morbidities (includes updates in patient status) ?X- 1 5 ?Reassessment of Adherence to Treatment Plan ?ASSESSMENTS - Wound and Skin A ssessment / Reassessment ?X - Simple Wound Assessment / Reassessment - one wound 1 5 ?[]  - 0 ?Complex Wound Assessment / Reassessment - multiple wounds ?[]  - 0 ?Dermatologic / Skin Assessment (not related to wound area) ?ASSESSMENTS - Focused Assessment ?[]  - 0 ?Circumferential Edema Measurements - multi extremities ?[]  - 0 ?Nutritional Assessment / Counseling / Intervention ?[]  - 0 ?Lower Extremity Assessment (monofilament, tuning fork, pulses) ?[]  - 0 ?Peripheral Arterial Disease Assessment (using hand held doppler) ?ASSESSMENTS - Ostomy and/or Continence Assessment and Care ?[]  - 0 ?Incontinence Assessment and Management ?[]  - 0 ?Ostomy Care Assessment and Management (repouching, etc.) ?PROCESS - Coordination of Care ?[]  - 0 ?Simple Patient / Family Education for ongoing care ?X- 1 20 ?Complex (extensive) Patient / Family Education for ongoing care ?[]  - 0 ?Staff obtains Consents, Records, T Results / Process Orders ?est ?[]  - 0 ?Staff telephones HHA, Nursing Homes / Clarify orders / etc ?[]  - 0 ?Routine Transfer to another Facility (non-emergent condition) ?[]  - 0 ?Routine Hospital Admission (non-emergent condition) ?[]  - 0 ?New Admissions / / Ordering NPWT Apligraf, etc. ?, ?[]  - 0 ?Emergency Hospital Admission (emergent condition) ?[]  - 0 ?Simple Discharge Coordination ?[]  - 0 ?Complex (extensive) Discharge Coordination ?PROCESS - Special Needs ?[]  - 0 ?Pediatric / Minor Patient Management ?[]  - 0 ?Isolation Patient Management ?[]  - 0 ?Hearing / Language / Visual special needs ?[]  - 0 ?Assessment of  Community assistance (transportation, D/C planning, etc.) ?[]  - 0 ?Additional assistance / Altered mentation ?[]  - 0 ?Support Surface(s) Assessment (bed, cushion, seat, etc.) ?INTERVENTIONS -  Wound Cleansing / Measurement ?X - Simple Wound Cleansing - one wound 1 5 ?[]  - 0 ?Complex Wound Cleansing - multiple wounds ?X- 1 5 ?Wound Imaging (photographs - any number of wounds) ?[]  - 0 ?Wound Tracing (instead of photographs) ?X- 1 5 ?Simple Wound Measurement - one wound ?[]  - 0 ?Complex Wound Measurement - multiple wounds ?INTERVENTIONS - Wound Dressings ?X - Small Wound Dressing one or multiple wounds 1 10 ?[]  - 0 ?Medium Wound Dressing one or multiple wounds ?[]  - 0 ?Large Wound Dressing one or multiple wounds ?[]  - 0 ?Application of Medications - topical ?[]  - 0 ?Application of Medications - injection ?INTERVENTIONS - Miscellaneous ?[]  - 0 ?External ear exam ?[]  - 0 ?Specimen Collection (cultures, biopsies, blood, body fluids, etc.) ?[]  - 0 ?Specimen(s) / Culture(s) sent or taken to Lab for analysis ?[]  - 0 ?Patient Transfer (multiple staff / / Similar devices) ?[]  - 0 ?Simple Staple / Suture removal (25 or less) ?[]  - 0 ?Complex Staple / Suture removal (26 or more) ?[]  - 0 ?Hypo / Hyperglycemic Management (close monitor of Blood Glucose) ?[]  - 0 ?Ankle / Brachial Index (ABI) - do not check if billed separately ?X- 1 5 ?Vital Signs ?Has the patient been seen at the hospital within the last three years: Yes ?Total Score: 70 ?Level Of Care: New/Established - Level 2 ?Electronic Signature(s) ?Signed: 08/22/2021 12:06:11 PM By: ?Entered By: on 08/22/2021 08:42:15 ?-------------------------------------------------------------------------------- ?Encounter Discharge Information Details ?Patient Name: Date of Service: ? ,  W. 08/22/2021 8:00 A M ?Medical Record Number: ?Patient Account Number: ?Date of Birth/Sex: Treating RN: ?[birth year] (76 y.o. ?Primary Care Takela Varden: Other Clinician: ?Referring Dierdre Mccalip: ?Treating Eamon Tantillo/Extender: ?08/24/2021 ?Weeks in Treatment: 12 ?Encounter Discharge Information Items ?Discharge Condition: Stable ?Ambulatory Status: Antonieta Iba ?Discharge Destination: Home ?Transportation: Private Auto ?Schedule Follow-up Appointment: Yes ?Clinical Summary of Care: Patient Declined ?Electronic Signature(s) ?Signed: 08/22/2021 12:06:11 PM By: 08/24/2021 ?Entered By: Sol Passer on 08/22/2021 08:41:49 ?-------------------------------------------------------------------------------- ?Patient/Caregiver Education Details ?Patient Name: Date of Service: ?EHUD, [name] 4/21/2023andnbsp8:00 A M ?Medical Record Number: [id number] ?Patient Account Number: 13/03/1946 ?Date of Birth/Gender: Treating RN: ?[birth year] (76 y.o. [name] ?Primary Care Physician: Geralyn Corwin Other Clinician: ?Referring Physician: ?Treating Physician/Extender: [name] ?Dan Humphreys ?Weeks in Treatment: 12 ?Education Assessment ?Education Provided To: ?Patient ?Education Topics Provided ?Offloading: ?Methods: Explain/Verbal ?Responses: State content correctly ?Wound/Skin Impairment: ?Methods: Demonstration, Explain/Verbal, Printed ?Responses: State content correctly ?Electronic Signature(s) ?Signed: 08/22/2021 12:06:11 PM By: Antonieta Iba ?Entered By: Antonieta Iba on 08/22/2021 08:41:33 ?-------------------------------------------------------------------------------- ?Wound Assessment Details ?Patient Name: ?Date of Service: ?Sol Passer, 03-03-1990 08/24/2021 W. 08/22/2021 8:00 A M ?Medical Record Number: [id number] ?Patient Account Number: 13/03/1946 ?Date of Birth/Sex: ?Treating RN: ?[birth year] (76 y.o. [name] ?Primary Care Hazael Olveda: Geralyn Corwin ?Other Clinician: ?Referring Mariachristina Holle: ?Treating Dereon Williamsen/Extender: [name] ?08/24/2021 ?Weeks in Treatment: 12 ?Wound Status ?Wound  Number: 1 Primary Diabetic Wound/Ulcer of the Lower Extremity ?Etiology: ?Wound Location: Left T [place] ?Wound Open ?Wounding Event: Blister ?Status: ?Date Acquired: 10/02/2020 ?Comorbid Cataracts, Coronary Artery Antonieta Iba

## 2021-08-22 NOTE — Progress Notes (Signed)
GAYNOR, FERRERAS ([id number]) ?Visit Report for 08/22/2021 ?SuperBill Details ?Patient Name: Date of Service: ?Sol Passer, [place] BERT W. 08/22/2021 ?Medical Record Number: [id number] ?Patient Account Number: [id number] ?Date of Birth/Sex: Treating RN: ?01/04/1946 (76 y.o. Lytle Michaels ?Primary Care Provider: [name] Other Clinician: ?Referring Provider: ?Treating Provider/Extender: Geralyn Corwin ?[name] ?Weeks in Treatment: 12 ?Diagnosis Coding ?ICD-10 Codes ?Code Description ?K48.185 Non-pressure chronic ulcer of other part of left foot with fat layer exposed ?E11.621 Type 2 diabetes mellitus with foot ulcer ?Z95.5 Presence of coronary angioplasty implant and graft ?E78.5 Hyperlipidemia, unspecified ?M54.50 Low back pain, unspecified ?Facility Procedures ?CPT4 Code Description Modifier Quantity ?[id number] 63785 - WOUND CARE VISIT-LEV 2 EST PT 1 ?Electronic Signature(s) ?Signed: 08/22/2021 12:06:11 PM By: Antonieta Iba ?Signed: 08/22/2021 4:54:21 PM By: Geralyn Corwin DO ?Entered By: Antonieta Iba on 08/22/2021 88:50:27 ?

## 2021-08-26 DIAGNOSIS — M5117 Intervertebral disc disorders with radiculopathy, lumbosacral region: Secondary | ICD-10-CM | POA: Diagnosis not present

## 2021-08-26 DIAGNOSIS — R262 Difficulty in walking, not elsewhere classified: Secondary | ICD-10-CM | POA: Diagnosis not present

## 2021-08-26 DIAGNOSIS — M25652 Stiffness of left hip, not elsewhere classified: Secondary | ICD-10-CM | POA: Diagnosis not present

## 2021-08-26 DIAGNOSIS — M25651 Stiffness of right hip, not elsewhere classified: Secondary | ICD-10-CM | POA: Diagnosis not present

## 2021-08-29 DIAGNOSIS — M25652 Stiffness of left hip, not elsewhere classified: Secondary | ICD-10-CM | POA: Diagnosis not present

## 2021-08-29 DIAGNOSIS — M25651 Stiffness of right hip, not elsewhere classified: Secondary | ICD-10-CM | POA: Diagnosis not present

## 2021-08-29 DIAGNOSIS — M5117 Intervertebral disc disorders with radiculopathy, lumbosacral region: Secondary | ICD-10-CM | POA: Diagnosis not present

## 2021-08-29 DIAGNOSIS — R262 Difficulty in walking, not elsewhere classified: Secondary | ICD-10-CM | POA: Diagnosis not present

## 2021-09-02 DIAGNOSIS — M25652 Stiffness of left hip, not elsewhere classified: Secondary | ICD-10-CM | POA: Diagnosis not present

## 2021-09-02 DIAGNOSIS — M25651 Stiffness of right hip, not elsewhere classified: Secondary | ICD-10-CM | POA: Diagnosis not present

## 2021-09-02 DIAGNOSIS — R262 Difficulty in walking, not elsewhere classified: Secondary | ICD-10-CM | POA: Diagnosis not present

## 2021-09-02 DIAGNOSIS — M5117 Intervertebral disc disorders with radiculopathy, lumbosacral region: Secondary | ICD-10-CM | POA: Diagnosis not present

## 2021-09-04 DIAGNOSIS — M5117 Intervertebral disc disorders with radiculopathy, lumbosacral region: Secondary | ICD-10-CM | POA: Diagnosis not present

## 2021-09-04 DIAGNOSIS — M25651 Stiffness of right hip, not elsewhere classified: Secondary | ICD-10-CM | POA: Diagnosis not present

## 2021-09-04 DIAGNOSIS — R262 Difficulty in walking, not elsewhere classified: Secondary | ICD-10-CM | POA: Diagnosis not present

## 2021-09-04 DIAGNOSIS — M25652 Stiffness of left hip, not elsewhere classified: Secondary | ICD-10-CM | POA: Diagnosis not present

## 2021-09-04 NOTE — Progress Notes (Deleted)
?Cardiology Office Note:   ? ?Date:  09/04/2021  ? ?ID:  Alex Jackson, DOB Jul 02, 1945, MRN [id number] ? ?PCP:  London Pepper, MD ?  ?[facility] Providers ?Cardiologist:  Ena Dawley, MD { ? ? ?Referring MD: London Pepper, MD  ? ? ?History of Present Illness:   ? ?Alex Jackson is a 76 y.o. male with a hx of CAD with history of NSTEMI in 2016 s/p PCI to LAD and 2019 s/p PCI to RCA and PDA, HTN, and DMII who was previously followed by Dr. Meda Coffee who now presents to clinic for follow-up. ? ?Per review of the record, the patient has history of NSTEMI  in 2016 and underwent PCI of the LAD.  His ejection fraction was moderately reduced but subsequently normalized.  He had recurrent non-STEMI in May 2019 and had PCI of the the distal right coronary artery with balloon angioplasty of the PDA through the stent struts. LVEF 55 to 60% with grade 1 DD on 2D echo at that time. He had lower extremity arterial Doppler in September 2021 which showed normal ABI bilaterally with mildly abnormal TBI. ? ?Last saw Dr. Fletcher Anon in 10/2020 for evaluation of possible PAD. LE arterial dopplers in 01/2020 normal.  ? ?Today, *** ? ?Past Medical History:  ?Diagnosis Date  ? CAD in native artery 03/01/2015  ? 1. cath - DES to LAD residual non obstructive RCA disease in 2016 b. Cath s/p Successful PCI with stenting of the distal RCA into the PLOM with a DES. Balloon angioplasty of the PDA through the stent struts.   ? Diabetes mellitus without complication ([place])   ? Edema of both lower extremities   ? Hyperlipidemia   ? Hypertension   ? Leg weakness   ? Overweight   ? Spinal stenosis   ? Ulcer of great toe ([place])   ? ? ?Past Surgical History:  ?Procedure Laterality Date  ? CARDIAC CATHETERIZATION N/A 03/01/2015  ? Procedure: Left Heart Cath and Coronary Angiography;  Surgeon: Burnell Blanks, MD;  Location: [facility];  Service: Cardiovascular;  Laterality: N/A;  ? CORONARY STENT INTERVENTION N/A 09/28/2017  ? Procedure:  CORONARY STENT INTERVENTION;  Surgeon: Martinique, Peter M, MD;  Location: [facility];  Service: Cardiovascular;  Laterality: N/A;  ? LEFT HEART CATH AND CORONARY ANGIOGRAPHY N/A 09/28/2017  ? Procedure: LEFT HEART CATH AND CORONARY ANGIOGRAPHY;  Surgeon: Martinique, Peter M, MD;  Location: [facility];  Service: Cardiovascular;  Laterality: N/A;  ? NO PAST SURGERIES    ? ? ?Current Medications: ?No outpatient medications have been marked as taking for the 09/08/21 encounter (Appointment) with Freada Bergeron, MD.  ?  ? ?Allergies:   Prednisone, Atorvastatin, and Pravastatin  ? ?Social History  ? ?Socioeconomic History  ? Marital status: Divorced  ?  Spouse name: Not on file  ? Number of children: Not on file  ? Years of education: Not on file  ? Highest education level: Not on file  ?Occupational History  ? Not on file  ?Tobacco Use  ? Smoking status: Former  ?  Packs/day: 1.00  ?  Types: Cigarettes  ?  Quit date: 07/03/1983  ?  Years since quitting: 38.2  ? Smokeless tobacco: Never  ? Tobacco comments:  ?  quit in Mar 1985  ?Vaping Use  ? Vaping Use: Never used  ?Substance and Sexual Activity  ? Alcohol use: Yes  ?  Alcohol/week: 0.0 standard drinks  ?  Comment: "  one or two drinks a night"  ? Drug use: No  ? Sexual activity: Not on file  ?Other Topics Concern  ? Not on file  ?Social History Narrative  ? Not on file  ? ?Social Determinants of Health  ? ?Financial Resource Strain: Not on file  ?Food Insecurity: Not on file  ?Transportation Needs: Not on file  ?Physical Activity: Not on file  ?Stress: Not on file  ?Social Connections: Not on file  ?  ? ?Family History: ?The patient's ***family history includes Arrhythmia in his sister; Liver cancer in his father; Thyroid cancer in his mother. ? ?ROS:   ?Please see the history of present illness.    ?*** All other systems reviewed and are negative. ? ?EKGs/Labs/Other Studies Reviewed:   ? ?The following studies were reviewed today: ?[place] 2019: ?Previously placed  Prox LAD drug eluting stent is widely patent. ?Mid RCA to Dist RCA lesion is 30% stenosed. ?RPDA lesion is 20% stenosed. ?Dist RCA lesion is 90% stenosed with 99% stenosed side branch in Post Atrio. ?A drug-eluting stent was successfully placed using a STENT SYNERGY DES 3.5X16. ?Post intervention, there is a 0% residual stenosis. ?Post intervention, the side branch was reduced to 0% residual stenosis. ?Ost RPDA lesion is 60% stenosed. ?Balloon angioplasty was performed. ?Post intervention, there is a 0% residual stenosis. ?LV end diastolic pressure is normal. ?  ?1. Single vessel obstructive CAD  ?2. Patent stent in the LAD ?3. Normal LVEDP ?4. Successful PCI with stenting of the distal RCA into the PLOM with a DES. Balloon angioplasty of the PDA through the stent struts.  ?  ?Plan: DAPT for one year. Risk factor modification. Anticipate DC in am. ? ?TTE 09/2017: ?Study Conclusions  ? ?- Left ventricle: The cavity size was normal. Wall thickness was  ?  increased in a pattern of mild LVH. Systolic function was normal.  ?  The estimated ejection fraction was in the range of 55% to 60%.  ?  Wall motion was normal; there were no regional wall motion  ?  abnormalities. Doppler parameters are consistent with abnormal  ?  left ventricular relaxation (grade 1 diastolic dysfunction).  ?- Mitral valve: Valve area by pressure half-time: 1.67 cm^2.  ? ?Impressions:  ? ?- Normal LV systolic function; mild diastolic dysfunction; mild  ?  LVH; trace MR and TR.  ? ?EKG:  EKG is *** ordered today.  The ekg ordered today demonstrates *** ? ?Recent Labs: ?02/16/2021: BUN 27; Creatinine, Ser 0.89; Hemoglobin 14.1; Platelets 225; Potassium 4.1; Sodium 135  ?Recent Lipid Panel ?   ?Component Value Date/Time  ? CHOL 141 06/26/2019 0845  ? TRIG 242 (H) 06/26/2019 0845  ? HDL 32 (L) 06/26/2019 0845  ? CHOLHDL 4.4 06/26/2019 0845  ? CHOLHDL 5.4 09/27/2017 0222  ? VLDL UNABLE TO CALCULATE IF TRIGLYCERIDE OVER 400 mg/dL 09/27/2017 0222  ?  [place] 69 06/26/2019 0845  ? ? ? ?Risk Assessment/Calculations:   ?{Does this patient have ATRIAL FIBRILLATION?[phone]} ? ?    ? ?Physical Exam:   ? ?VS:  There were no vitals taken for this visit.   ? ?Wt Readings from Last 3 Encounters:  ?02/16/21 255 lb (115.7 kg)  ?10/29/20 273 lb (123.8 kg)  ?10/03/20 268 lb (121.6 kg)  ?  ? ?GEN: *** Well nourished, well developed in no acute distress ?HEENT: Normal ?NECK: No JVD; No carotid bruits ?LYMPHATICS: No lymphadenopathy ?CARDIAC: ***RRR, no murmurs, rubs, gallops ?RESPIRATORY:  Clear to auscultation without rales, wheezing  or rhonchi  ?ABDOMEN: Soft, non-tender, non-distended ?MUSCULOSKELETAL:  No edema; No deformity  ?SKIN: Warm and dry ?NEUROLOGIC:  Alert and oriented x 3 ?PSYCHIATRIC:  Normal affect  ? ?ASSESSMENT:   ? ?No diagnosis found. ?PLAN:   ? ?In order of problems listed above: ? ?#CAD with history of NSTEMI: ?Patient with history of NSTEMI in 2016 with PCI to LAD and recurrent NSTEMI in 2019 s/p PCI to RCA and PDA. TTE 09/2017 with LVEF 55-60%, G1DD, normal RV, no significant valve disease. Currently, *** ?-Continue ASA 81mg  daily ?-Continue crestor 10mg  daily ?-Continue bisoprolol 10mg  daily ?-Continue lisinopril 40mg  daily ? ?#HTN: ?-Continue bisoprolol 10mg  daily ?-Continue lisinopril 40mg  daily ? ?#HLD: ?-Continue crestor 10mg  daily ?-Check lipids ? ?#DMII: ?-Management per PCP ? ?   ? ?{Are you ordering a CV Procedure (e.g. stress test, cath, DCCV, TEE, etc)?   Press F2        :[id number]  ? ? ?Medication Adjustments/Labs and Tests Ordered: ?Current medicines are reviewed at length with the patient today.  Concerns regarding medicines are outlined above.  ?No orders of the defined types were placed in this encounter. ? ?No orders of the defined types were placed in this encounter. ? ? ?There are no Patient Instructions on file for this visit.  ? ?Signed, ?Freada Bergeron, MD  ?09/04/2021 8:18 PM    ?[place] ?

## 2021-09-05 ENCOUNTER — Encounter (HOSPITAL_BASED_OUTPATIENT_CLINIC_OR_DEPARTMENT_OTHER): Payer: PPO | Attending: Internal Medicine | Admitting: Internal Medicine

## 2021-09-05 DIAGNOSIS — L080 Pyoderma: Secondary | ICD-10-CM | POA: Diagnosis not present

## 2021-09-05 DIAGNOSIS — E785 Hyperlipidemia, unspecified: Secondary | ICD-10-CM | POA: Diagnosis not present

## 2021-09-05 DIAGNOSIS — Z955 Presence of coronary angioplasty implant and graft: Secondary | ICD-10-CM | POA: Insufficient documentation

## 2021-09-05 DIAGNOSIS — G8929 Other chronic pain: Secondary | ICD-10-CM | POA: Insufficient documentation

## 2021-09-05 DIAGNOSIS — M545 Low back pain, unspecified: Secondary | ICD-10-CM | POA: Insufficient documentation

## 2021-09-05 DIAGNOSIS — E11621 Type 2 diabetes mellitus with foot ulcer: Secondary | ICD-10-CM | POA: Insufficient documentation

## 2021-09-05 DIAGNOSIS — L97522 Non-pressure chronic ulcer of other part of left foot with fat layer exposed: Secondary | ICD-10-CM | POA: Insufficient documentation

## 2021-09-05 DIAGNOSIS — B957 Other staphylococcus as the cause of diseases classified elsewhere: Secondary | ICD-10-CM | POA: Diagnosis not present

## 2021-09-08 ENCOUNTER — Encounter: Payer: Self-pay | Admitting: Cardiology

## 2021-09-08 ENCOUNTER — Ambulatory Visit (INDEPENDENT_AMBULATORY_CARE_PROVIDER_SITE_OTHER): Payer: PPO | Admitting: Cardiology

## 2021-09-08 VITALS — BP 138/82 | HR 73 | Ht 71.0 in | Wt 255.0 lb

## 2021-09-08 DIAGNOSIS — E119 Type 2 diabetes mellitus without complications: Secondary | ICD-10-CM | POA: Diagnosis not present

## 2021-09-08 DIAGNOSIS — Z955 Presence of coronary angioplasty implant and graft: Secondary | ICD-10-CM

## 2021-09-08 DIAGNOSIS — I5032 Chronic diastolic (congestive) heart failure: Secondary | ICD-10-CM

## 2021-09-08 DIAGNOSIS — E785 Hyperlipidemia, unspecified: Secondary | ICD-10-CM

## 2021-09-08 DIAGNOSIS — I2510 Atherosclerotic heart disease of native coronary artery without angina pectoris: Secondary | ICD-10-CM | POA: Diagnosis not present

## 2021-09-08 DIAGNOSIS — I10 Essential (primary) hypertension: Secondary | ICD-10-CM | POA: Diagnosis not present

## 2021-09-08 DIAGNOSIS — E669 Obesity, unspecified: Secondary | ICD-10-CM

## 2021-09-08 NOTE — Progress Notes (Signed)
?Cardiology Office Note:   ? ?Date:  09/08/2021  ? ?ID:  Alex Jackson, DOB 11/09/1945, MRN [id number] ? ?PCP:  London Pepper, MD ?  ?[facility] Providers ?Cardiologist:  Ena Dawley, MD { ? ? ?Referring MD: London Pepper, MD  ? ? ?History of Present Illness:   ? ?Alex Jackson is a 76 y.o. male with a hx of CAD with history of NSTEMI in 2016 s/p PCI to LAD and 2019 s/p PCI to RCA and PDA, HTN, and DMII who was previously followed by Dr. Meda Coffee who now presents to clinic for follow-up. ? ?Per review of the record, the patient has history of NSTEMI  in 2016 and underwent PCI of the LAD.  His ejection fraction was moderately reduced but subsequently normalized.  He had recurrent non-STEMI in May 2019 and had PCI of the the distal right coronary artery with balloon angioplasty of the PDA through the stent struts. LVEF 55 to 60% with grade 1 DD on 2D echo at that time. He had lower extremity arterial Doppler in September 2021 which showed normal ABI bilaterally with mildly abnormal TBI. ? ?Last saw Dr. Fletcher Anon in 10/2020 for evaluation of possible PAD. LE arterial dopplers in 01/2020 normal.  ? ?Today, the patient states that he is feeling fine from a heart perspective. He denies chest pain.  ? ?His main complaints are regarding his legs. He endorses neuropathy, as well as a slow healing lesion on his left great toe. He continues to suffer from bilateral LE weakness; he ambulates with a walker. Fortunately, no evidence of PAD on LE ABIs. ? ?Typically at night he is exhausted and is able to fall asleep quickly. However, he complains of urinary frequency up to 5 times a night regardless if he takes furosemide. Currently he is taking furosemide about once a week. Usually he does not notice any edema. ? ?He denies any palpitations, or shortness of breath. No lightheadedness, headaches, syncope, orthopnea, or PND. ? ?At his last visit with his PCP he states he had labs done. His latest A1C is 5.8 as of 09/06/21 per  his patient portal. ? ?Past Medical History:  ?Diagnosis Date  ? CAD in native artery 03/01/2015  ? 1. cath - DES to LAD residual non obstructive RCA disease in 2016 b. Cath s/p Successful PCI with stenting of the distal RCA into the PLOM with a DES. Balloon angioplasty of the PDA through the stent struts.   ? Diabetes mellitus without complication ([place])   ? Edema of both lower extremities   ? Hyperlipidemia   ? Hypertension   ? Leg weakness   ? Overweight   ? Spinal stenosis   ? Ulcer of great toe ([place])   ? ? ?Past Surgical History:  ?Procedure Laterality Date  ? CARDIAC CATHETERIZATION N/A 03/01/2015  ? Procedure: Left Heart Cath and Coronary Angiography;  Surgeon: Burnell Blanks, MD;  Location: [facility];  Service: Cardiovascular;  Laterality: N/A;  ? CORONARY STENT INTERVENTION N/A 09/28/2017  ? Procedure: CORONARY STENT INTERVENTION;  Surgeon: Martinique, Peter M, MD;  Location: [facility];  Service: Cardiovascular;  Laterality: N/A;  ? LEFT HEART CATH AND CORONARY ANGIOGRAPHY N/A 09/28/2017  ? Procedure: LEFT HEART CATH AND CORONARY ANGIOGRAPHY;  Surgeon: Martinique, Peter M, MD;  Location: [facility];  Service: Cardiovascular;  Laterality: N/A;  ? NO PAST SURGERIES    ? ? ?Current Medications: ?Current Meds  ?Medication Sig  ? acetaminophen (TYLENOL) 325  MG tablet Take 2 tablets (650 mg total) by mouth every 4 (four) hours as needed for headache or mild pain.  ? aspirin EC 81 MG tablet Take 81 mg by mouth daily.  ? bisoprolol (ZEBETA) 10 MG tablet TAKE ONE TABLET BY MOUTH ONCE DAILY  ? chlorthalidone (HYGROTON) 25 MG tablet Take 1 tablet (25 mg total) by mouth daily. Please make yearly appt with new Cardiologist Dr. Johney Frame for June 2023 before anymore refills. Thank you 1st attempt  ? cyclobenzaprine (FLEXERIL) 10 MG tablet Take 1 tablet (10 mg total) by mouth 2 (two) times daily as needed for up to 20 doses for muscle spasms.  ? fluticasone (FLONASE) 50 MCG/ACT nasal spray USE 1  SPRAY IN EACH NOSTRIL EVERY DAY  ? hydroxypropyl methylcellulose / hypromellose (ISOPTO TEARS / GONIOVISC) 2.5 % ophthalmic solution Place 1 drop into both eyes as needed for dry eyes.  ? lisinopril (ZESTRIL) 40 MG tablet Take 1 tablet (40 mg total) by mouth daily. Please make yearly appt with new Cardiologist Dr. Johney Frame for June 2023 before anymore refills. Thank you 1st attempt  ? loteprednol (LOTEMAX) 0.5 % ophthalmic suspension   ? metFORMIN (GLUCOPHAGE) 500 MG tablet Take 1 tablet (500 mg total) by mouth 2 (two) times daily.  ? Multiple Vitamin (MULTIVITAMIN WITH MINERALS) TABS tablet Take 1 tablet by mouth daily.  ? nitroGLYCERIN (NITROSTAT) 0.4 MG SL tablet Place 1 tablet (0.4 mg total) under the tongue every 5 (five) minutes x 3 doses as needed for chest pain.  ? omega-3 acid ethyl esters (LOVAZA) 1 g capsule TAKE ONE CAPSULE BY MOUTH EVERY MORNING and TAKE ONE CAPSULE BY MOUTH EVERYDAY AT BEDTIME  ? pantoprazole (PROTONIX) 40 MG tablet Take 1 tablet (40 mg total) by mouth daily. Please make yearly appt with Dr. Johney Frame new Cardiologist for June 2023 before anymore refills. Thank you 1st attempt  ? pregabalin (LYRICA) 75 MG capsule Take 1 capsule (75 mg total) by mouth 2 (two) times daily.  ? rosuvastatin (CRESTOR) 10 MG tablet Take 1 tablet (10 mg total) by mouth daily.  ?  ? ?Allergies:   Prednisone, Atorvastatin, and Pravastatin  ? ?Social History  ? ?Socioeconomic History  ? Marital status: Divorced  ?  Spouse name: Not on file  ? Number of children: Not on file  ? Years of education: Not on file  ? Highest education level: Not on file  ?Occupational History  ? Not on file  ?Tobacco Use  ? Smoking status: Former  ?  Packs/day: 1.00  ?  Types: Cigarettes  ?  Quit date: 07/03/1983  ?  Years since quitting: 38.2  ? Smokeless tobacco: Never  ? Tobacco comments:  ?  quit in Mar 1985  ?Vaping Use  ? Vaping Use: Never used  ?Substance and Sexual Activity  ? Alcohol use: Yes  ?  Alcohol/week: 0.0 standard  drinks  ?  Comment: "one or two drinks a night"  ? Drug use: No  ? Sexual activity: Not on file  ?Other Topics Concern  ? Not on file  ?Social History Narrative  ? Not on file  ? ?Social Determinants of Health  ? ?Financial Resource Strain: Not on file  ?Food Insecurity: Not on file  ?Transportation Needs: Not on file  ?Physical Activity: Not on file  ?Stress: Not on file  ?Social Connections: Not on file  ?  ? ?Family History: ?The patient's family history includes Arrhythmia in his sister; Liver cancer in his father; Thyroid cancer  in his mother. ? ?ROS:   ?Review of Systems  ?Constitutional:  Negative for chills and fever.  ?HENT:  Negative for congestion and nosebleeds.   ?Eyes:  Negative for pain.  ?Respiratory:  Negative for sputum production and shortness of breath.   ?Cardiovascular:  Negative for chest pain, palpitations, orthopnea, claudication, leg swelling and PND.  ?Gastrointestinal:  Negative for constipation and melena.  ?Genitourinary:  Positive for frequency. Negative for hematuria.  ?Musculoskeletal:  Positive for back pain, joint pain and myalgias. Negative for falls.  ?Neurological:  Positive for focal weakness (Bilateral LE). Negative for dizziness and seizures.  ?Endo/Heme/Allergies:  Negative for environmental allergies.  ?Psychiatric/Behavioral:  Negative for hallucinations and substance abuse.   ? ? ?EKGs/Labs/Other Studies Reviewed:   ? ?The following studies were reviewed today: ? ?Bilateral LE Venous Doppler 02/16/2021: ?Other Findings:  Peripheral edema noted. ?  ?IMPRESSION: ?No significant DVT in either extremity. ?  ?Left knee joint effusion. ? ?ABI Doppler 11/18/2020: ?Bilateral ABIs appear essentially unchanged compared to prior study on  ?01/12/2020. Bilateral TBIs appear increased compared to prior study on  ?01/12/2020.  ?   ?Summary:  ?Right: Resting right ankle-brachial index is within normal range. No  ?evidence of significant right lower extremity arterial disease. The right   ?toe-brachial index is normal.  ? ?TBIs increased by .35.  ? ?Left: Resting left ankle-brachial index is within normal range. No  ?evidence of significant left lower extremity arterial disease. The left  ?to

## 2021-09-08 NOTE — Patient Instructions (Signed)
Medication Instructions:  ?Your physician recommends that you continue on your current medications as directed. Please refer to the Current Medication list given to you today. ? ?*If you need a refill on your cardiac medications before your next appointment, please call your pharmacy* ? ? ?Lab Work: ?none ?If you have labs (blood work) drawn today and your tests are completely normal, you will receive your results only by: ?MyChart Message (if you have MyChart) OR ?A paper copy in the mail ?If you have any lab test that is abnormal or we need to change your treatment, we will call you to review the results. ? ? ?Testing/Procedures: ?none ? ? ?Follow-Up: ?At [facility], you and your health needs are our priority.  As part of our continuing mission to provide you with exceptional heart care, we have created designated Provider Care Teams.  These Care Teams include your primary Cardiologist (physician) and Advanced Practice Providers (APPs -  Physician Assistants and Nurse Practitioners) who all work together to provide you with the care you need, when you need it. ? ?We recommend signing up for the patient portal called "MyChart".  Sign up information is provided on this After Visit Summary.  MyChart is used to connect with patients for Virtual Visits (Telemedicine).  Patients are able to view lab/test results, encounter notes, upcoming appointments, etc.  Non-urgent messages can be sent to your provider as well.   ?To learn more about what you can do with MyChart, go to [email or web address].   ? ?Your next appointment:   ?6 month(s) ? ?The format for your next appointment:   ?In Person ? ?Provider:   ?Dr Laurance Flatten ? ?If primary card or EP is not listed click here to update    :1}  ? ? ?Other Instructions ? ? ?Important Information About Sugar ? ? ? ? ?  ?

## 2021-09-08 NOTE — Progress Notes (Signed)
PANCHO, RUSHING ([id number]) ?Visit Report for 09/05/2021 ?Chief Complaint Document Details ?Patient Name: Date of Service: ?Alex Jackson, [place] YCXK W. 09/05/2021 8:45 A M ?Medical Record Number: [id number] ?Patient Account Number: [id number] ?Date of Birth/Sex: Treating RN: ?Apr 20, 1946 (76 y.o. Lytle Michaels ?Primary Care Provider: [name] Other Clinician: ?Referring Provider: ?Treating Provider/Extender: Geralyn Corwin ?[name] ?Weeks in Treatment: 14 ?Information Obtained from: Patient ?Chief Complaint ?Left great toe wound ?Electronic Signature(s) ?Signed: 09/05/2021 10:28:18 AM By: Geralyn Corwin DO ?Entered By: Geralyn Corwin on 09/05/2021 09:56:47 ?-------------------------------------------------------------------------------- ?Debridement Details ?Patient Name: Date of Service: ?Alex [place], [place] HFWY W. 09/05/2021 8:45 A M ?Medical Record Number: [id number] ?Patient Account Number: [id number] ?Date of Birth/Sex: Treating RN: ?[birth year] (76 y.o. [name], [name] ?Primary Care Provider: [name] Other Clinician: ?Referring Provider: ?Treating Provider/Extender: Geralyn Corwin ?[name] ?Weeks in Treatment: 14 ?Debridement Performed for Assessment: Wound #1 Left T Great ?oe ?Performed By: Physician Geralyn Corwin, DO ?Debridement Type: Debridement ?Severity of Tissue Pre Debridement: Fat layer exposed ?Level of Consciousness (Pre-procedure): Awake and Alert ?Pre-procedure Verification/Time Out Yes - 09:25 ?Taken: ?Start Time: 09:25 ?Pain Control: Lidocaine ?T Area Debrided (L x W): ?otal 0.5 (cm) x 0.5 (cm) = 0.25 (cm?) ?Tissue and other material debrided: ?Viable, Non-Viable, Callus, Subcutaneous, Skin: Dermis , Skin: Epidermis ?Level: Skin/Subcutaneous Tissue ?Debridement Description: Excisional ?Instrument: Curette ?Bleeding: Minimum ?Hemostasis Achieved: Pressure ?End Time: 09:25 ?Procedural Pain: 0 ?Post Procedural Pain: 0 ?Response to Treatment: Procedure was tolerated well ?Level  of Consciousness (Post- Awake and Alert ?procedure): ?Post Debridement Measurements of Total Wound ?Length: (cm) 0.5 ?Width: (cm) 0.5 ?Depth: (cm) 0.9 ?Volume: (cm?) 0.177 ?Character of Wound/Ulcer Post Debridement: Improved ?Severity of Tissue Post Debridement: Fat layer exposed ?Post Procedure Diagnosis ?Same as Pre-procedure ?Electronic Signature(s) ?Signed: 09/05/2021 10:28:18 AM By: Geralyn Corwin DO ?Signed: 09/05/2021 12:37:27 PM By: Fonnie Mu RN ?Entered By: Fonnie Mu on 09/05/2021 09:28:24 ?-------------------------------------------------------------------------------- ?HPI Details ?Patient Name: Date of Service: ?Alex [place], [place] YDXA W. 09/05/2021 8:45 A M ?Medical Record Number: [id number] ?Patient Account Number: [id number] ?Date of Birth/Sex: Treating RN: ?[birth year] (76 y.o. Lytle Michaels ?Primary Care Provider: [name] Other Clinician: ?Referring Provider: ?Treating Provider/Extender: Geralyn Corwin ?[name] ?Weeks in Treatment: 14 ?History of Present Illness ?HPI Description: Admission 05/26/2021 ?Mr. Dene Nazir is a 76 year old male with a past medical history of controlled type 2 diabetes on oral agents, chronic back pain and coronary artery ?disease status post stenting to the LAD that presents to the clinic for a 19-month history of nonhealing wound to the left great toe plantar aspect. He has ?peripheral neuropathy and is not sure how the wound developed. He has been following with Dr. Victorino Dike for this issue and has been using Betadine to the area ?daily. He does not offload the area. He denies signs of infection. ?2/3; patient presents for follow-up. He obtained his left foot x-ray. He has been using gentamicin with hydrofera blue toe to the wound bed with dressing ?changes. He has no issues or complaints today. He denies signs of infection. ?2/17; patient presents for follow-up. He had his MRI that showed bone marrow edema at the distal aspect concerning for  osteomyelitis. Currently patient denies ?signs of infection. He feels well and has been using gentamicin with Hydrofera Blue to the wound bed. He states that he has ordered custom diabetic shoes. ?He obtains these at the end of the month. ?3/3; patient presents for follow-up. He had a scheduled appointment with Dr. Orvan Falconer, infectious disease yesterday.  Patient canceled and at this time does ?not want to follow-up with any other doctors for this issue. He currently denies signs of infection. He has been using gentamicin ointment with Hydrofera Blue ?to the wound bed. He has orthopedic shoes and has an area that offloads the left great toe. ?3/17; patient presents for follow-up. He reports stubbing his left great toe that has the wound. He has been using gentamicin ointment and Hydrofera Blue with ?dressing changes. He has orthopedic shoes that offload the left great toe however he is not wearing them today and he confirms that he does not wear these ?every day. ?3/27; patient presents for follow-up. He has his surgical shoe with a cut out to the left great toe. Unfortunately his foot is shifting in the shoe and pressure ?continues as noted by the drainage location. He continues to use gentamicin ointment with Hydrofera Blue. He denies signs of infection. He reports less ?drainage to the wound site. ?5/5; patient presents for follow-up. Unfortunately the patient did not follow-up as recommended. I have not seen this patient in over 4 weeks. He has no issues ?or complaints today. He states he is using gentamicin ointment and Hydrofera Blue however the blue dressing is not staying in place. He denies signs of ?systemic infection. ?Electronic Signature(s) ?Signed: 09/05/2021 10:28:18 AM By: Geralyn Corwin DO ?Entered By: Geralyn Corwin on 09/05/2021 09:58:20 ?-------------------------------------------------------------------------------- ?Physical Exam Details ?Patient Name: Date of Service: ?Alex Jackson, [place] HMCN W.  09/05/2021 8:45 A M ?Medical Record Number: [id number] ?Patient Account Number: [id number] ?Date of Birth/Sex: Treating RN: ?05-Oct-1945 (76 y.o. Lytle Michaels ?Primary Care Provider: [name] Other Clinician: ?Referring Provider: ?Treating Provider/Extender: Geralyn Corwin ?[name] ?Weeks in Treatment: 14 ?Constitutional ?respirations regular, non-labored and within target range for patient.Marland Kitchen ?Cardiovascular ?2+ dorsalis pedis/posterior tibialis pulses. ?Psychiatric ?pleasant and cooperative. ?Notes ?Left great toe: T the plantar aspect there is an open wound with granulation tissue, nonviable tissue and circumferential callus. No signs of surrounding ?o ?infection. ?Electronic Signature(s) ?Signed: 09/05/2021 10:28:18 AM By: Geralyn Corwin DO ?Entered By: Geralyn Corwin on 09/05/2021 09:58:56 ?-------------------------------------------------------------------------------- ?Physician Orders Details ?Patient Name: Date of Service: ?Alex [place], [place] OQHU W. 09/05/2021 8:45 A M ?Medical Record Number: [id number] ?Patient Account Number: [id number] ?Date of Birth/Sex: Treating RN: ?29-Apr-1946 (76 y.o. [name], [name] ?Primary Care Provider: [name] Other Clinician: ?Referring Provider: ?Treating Provider/Extender: Geralyn Corwin ?[name] ?Weeks in Treatment: 14 ?Verbal / Phone Orders: No ?Diagnosis Coding ?Follow-up Appointments ?ppointment in 1 week. - Friday Dr. Mikey Bussing and Lennox Laity, Room 7 ?Return A ?Other: - [employer]- wound care supplier ?Bathing/ Shower/ Hygiene ?May shower and wash wound with soap and water. - with dressing changes. ?Edema Control - Lymphedema / SCD / Other ?Elevate legs to the level of the heart or above for 30 minutes daily and/or when sitting, a frequency of: - 3-4 times a day throughout the day. ?Avoid standing for long periods of time. ?Moisturize legs daily. - every night before bed. ?Off-Loading ?Other: - Continue to wear Surgical Shoe with offloading  felt ?Wound Treatment ?Wound #1 - T Great ?oe Wound Laterality: Left ?Cleanser: Soap and Water 1 x Per Day/30 Days ?Discharge Instructions: May shower and wash wound with dial antibacterial soap and water prior t

## 2021-09-08 NOTE — Progress Notes (Signed)
JHALEN, ELEY ([id number]) ?Visit Report for 09/05/2021 ?Arrival Information Details ?Patient Name: Date of Service: ?[name], [name] 09/05/2021 8:45 A M ?Medical Record Number: [id number] ?Patient Account Number: [id number] ?Date of Birth/Sex: Treating RN: ?May 23, 1945 (76 y.o. [name], [name] ?Primary Care Osiris Charles: [facility] Other Clinician: ?Referring Maanav Kassabian: ?Treating Delena Casebeer/Extender: Kalman Shan ?[facility] ?Weeks in Treatment: 14 ?Visit Information History Since Last Visit ?Added or deleted any medications: No ?Patient Arrived: [name] ?Any new allergies or adverse reactions: No ?Arrival Time: 08:45 ?Had a fall or experienced change in No ?Accompanied By: self ?activities of daily living that may affect ?Transfer Assistance: None ?risk of falls: ?Patient Identification Verified: Yes ?Signs or symptoms of abuse/neglect since last visito No ?Secondary Verification Process Completed: Yes ?Hospitalized since last visit: No ?Patient Requires Transmission-Based Precautions: No ?Implantable device outside of the clinic excluding No ?Patient Has Alerts: Yes ?cellular tissue based products placed in the center ?Patient Alerts: 11/18/20 ABI L1.04 since last visit: ?11/18/20 TBI L 1.35 Has Dressing in Place as Prescribed: Yes ?Pain Present Now: No ?Electronic Signature(s) ?Signed: 09/05/2021 12:37:27 PM By: Rhae Hammock RN ?Entered By: Rhae Hammock on 09/05/2021 08:45:43 ?-------------------------------------------------------------------------------- ?Encounter Discharge Information Details ?Patient Name: Date of Service: ?[name], [name] 09/05/2021 8:45 A M ?Medical Record Number: [id number] ?Patient Account Number: [id number] ?Date of Birth/Sex: Treating RN: ?1945-09-24 (76 y.o. [name], [name] ?Primary Care Tatsuya Okray: [facility] Other Clinician: ?Referring Eloni Darius: ?Treating Rylann Munford/Extender: Kalman Shan ?[facility] ?Weeks in Treatment: 14 ?Encounter Discharge  Information Items ?Discharge Condition: Stable ?Ambulatory Status: [name] ?Discharge Destination: Home ?Transportation: Private Auto ?Accompanied By: self ?Schedule Follow-up Appointment: Yes ?Clinical Summary of Care: Patient Declined ?Electronic Signature(s) ?Signed: 09/05/2021 12:37:27 PM By: Rhae Hammock RN ?Entered By: Rhae Hammock on 09/05/2021 08:59:50 ?-------------------------------------------------------------------------------- ?Lower Extremity Assessment Details ?Patient Name: ?Date of Service: ?[name], [name] 09/05/2021 8:45 A M ?Medical Record Number: [id number] ?Patient Account Number: [id number] ?Date of Birth/Sex: ?Treating RN: ?1946/01/26 (76 y.o. [name], [name] ?Primary Care Adonus Uselman: [facility] ?Other Clinician: ?Referring Emmalee Solivan: ?Treating Judye Lorino/Extender: Kalman Shan ?[facility] ?Weeks in Treatment: 14 ?Edema Assessment ?Assessed: [Left: Yes] [Right: No] ?Edema: [Left: N] [Right: o] ?Calf ?Left: Right: ?Point of Measurement: 36 cm From Medial Instep 39.5 cm ?Ankle ?Left: Right: ?Point of Measurement: 12 cm From Medial Instep 24 cm ?Vascular Assessment ?Pulses: ?Dorsalis Pedis ?Palpable: [Left:Yes] ?Posterior Tibial ?Palpable: [Left:Yes] ?Electronic Signature(s) ?Signed: 09/05/2021 12:37:27 PM By: Rhae Hammock RN ?Entered By: Rhae Hammock on 09/05/2021 08:47:14 ?-------------------------------------------------------------------------------- ?Multi Wound Chart Details ?Patient Name: ?Date of Service: ?[name], [name] 09/05/2021 8:45 A M ?Medical Record Number: [id number] ?Patient Account Number: [id number] ?Date of Birth/Sex: ?Treating RN: ?30-Jun-1945 (76 y.o. [name] ?Primary Care Marsella Suman: [facility] ?Other Clinician: ?Referring Tiney Zipper: ?Treating Sammi Stolarz/Extender: Kalman Shan ?[facility] ?Weeks in Treatment: 14 ?Vital Signs ?Height(in): 71 ?Pulse(bpm): 74 ?Weight(lbs): 252 ?Blood Pressure(mmHg): 119/74 ?Body Mass Index(BMI):  35.1 ?Temperature(??F): 98.7 ?Respiratory Rate(breaths/min): 17 ?Photos: [1:Left Darien Ramus oe] [N/A:N/A N/A] ?Wound Location: [1:Blister] [N/A:N/A] ?Wounding Event: [1:Diabetic Wound/Ulcer of the Lower] [N/A:N/A] ?Primary Etiology: [1:Extremity Cataracts, Coronary Artery Disease, N/A] ?Comorbid History: [1:Hypertension, Myocardial Infarction, Type II Diabetes 10/02/2020] [N/A:N/A] ?Date Acquired: [1:14] [N/A:N/A] ?Weeks of Treatment: [1:Open] [N/A:N/A] ?Wound Status: [1:No] [N/A:N/A] ?Wound Recurrence: [1:0.5x0.5x0.9] [N/A:N/A] ?Measurements L x W x D (cm) [1:0.196] [N/A:N/A] ?A (cm?) : ?rea [1:0.177] [N/A:N/A] ?Volume (cm?) : [1:30.70%] [N/A:N/A] ?% Reduction in A [1:rea: -56.60%] [N/A:N/A] ?% Reduction in Volume: [1:12] ?Starting Position 1 (o'clock): [1:12] ?  Ending Position 1 (o'clock): [1:0.9] ?Maximum Distance 1 (cm): [1:Yes] [N/A:N/A] ?Undermining: [1:Grade 3] [N/A:N/A] ?Classification: [1:Medium] [N/A:N/A] ?Exudate A mount: [1:Serosanguineous] [N/A:N/A] ?Exudate Type: [1:red, brown] [N/A:N/A] ?Exudate Color: [1:Well defined, not attached] [N/A:N/A] ?Wound Margin: [1:Large (67-100%)] [N/A:N/A] ?Granulation A mount: [1:Red] [N/A:N/A] ?Granulation Quality: [1:Small (1-33%)] [N/A:N/A] ?Necrotic A mount: ?[1:Fat Layer (Subcutaneous Tissue): Yes N/A] ?Exposed Structures: ?[1:Fascia: No Tendon: No Muscle: No Joint: No Bone: No None] [N/A:N/A] ?Epithelialization: [1:Debridement - Excisional] [N/A:N/A] ?Debridement: ?Pre-procedure Verification/Time Out 09:25 [N/A:N/A] ?Taken: [1:Lidocaine] [N/A:N/A] ?Pain Control: [1:Callus, Subcutaneous] [N/A:N/A] ?Tissue Debrided: [1:Skin/Subcutaneous Tissue] [N/A:N/A] ?Level: [1:0.25] [N/A:N/A] ?Debridement A (sq cm): [1:rea Curette] [N/A:N/A] ?Instrument: [1:Minimum] [N/A:N/A] ?Bleeding: [1:Pressure] [N/A:N/A] ?Hemostasis A chieved: [1:0] [N/A:N/A] ?Procedural Pain: [1:0] [N/A:N/A] ?Post Procedural Pain: [1:Procedure was tolerated well] [N/A:N/A] ?Debridement Treatment Response:  [1:0.5x0.5x0.9] [N/A:N/A] ?Post Debridement Measurements L x ?W x D (cm) [1:0.177] [N/A:N/A] ?Post Debridement Volume: (cm?) [1:Debridement] [N/A:N/A] ?Treatment Notes ?Wound #1 (Toe Great) Wound Laterality: Left ?Cleanser ?Soap and Water ?Discharge Instruction: May shower and wash wound with dial antibacterial soap and water prior to dressing change. ?Peri-Wound Care ?Topical ?Gentamicin ?Discharge Instruction: As directed by physician ?Primary Dressing ?Hydrofera Blue Ready Foam, 2.5 x2.5 in ?Discharge Instruction: Apply to wound bed as instructed ?Secondary Dressing ?Optifoam Non-Adhesive Dressing, 4x4 in ?Discharge Instruction: Cut to make foam donut ?Woven Gauze Sponges 2x2 in ?Discharge Instruction: Apply over primary dressing and under the toe rolled gauze as directed. ?Secured With ?Conforming Stretch Gauze Bandage, Sterile 2x75 (in/in) ?Discharge Instruction: Secure with stretch gauze as directed. ?76M Medipore H Soft Cloth Surgical T ape, 4 x 10 (in/yd) ?Discharge Instruction: Secure with tape as directed. ?Compression Wrap ?Compression Stockings ?Add-Ons ?Electronic Signature(s) ?Signed: 09/05/2021 10:28:18 AM By: Kalman Shan DO ?Signed: 09/08/2021 10:25:41 AM By: Lorrin Badilla ?Entered By: Kalman Shan on 09/05/2021 09:56:32 ?-------------------------------------------------------------------------------- ?Multi-Disciplinary Care Plan Details ?Patient Name: ?Date of Service: ?[name], [name] 09/05/2021 8:45 A M ?Medical Record Number: [id number] ?Patient Account Number: [id number] ?Date of Birth/Sex: ?Treating RN: ?1945/05/20 (76 y.o. [name], [name] ?Primary Care Diontay Rosencrans: [facility] ?Other Clinician: ?Referring Lenor Provencher: ?Treating Graylen Noboa/Extender: Kalman Shan ?[facility] ?Weeks in Treatment: 14 ?Multidisciplinary Care Plan reviewed with physician ?Active Inactive ?Nutrition ?Nursing Diagnoses: ?Potential for alteratiion in Nutrition/Potential for imbalanced  nutrition ?Goals: ?Patient/caregiver agrees to and verbalizes understanding of need to obtain nutritional consultation ?Date Initiated: 05/26/2021 ?Date Inactivated: 06/06/2021 ?Target Resolution Date: 06/06/2021 ?Goal Status: Met ?Patient/

## 2021-09-09 ENCOUNTER — Telehealth: Payer: Self-pay | Admitting: Pharmacist Clinician (PhC)/ Clinical Pharmacy Specialist

## 2021-09-09 NOTE — Telephone Encounter (Signed)
Appointment set with [facility] Deboraha Sprang records show A1c > 6.5 in the past ? ?

## 2021-09-09 NOTE — Telephone Encounter (Signed)
-----   Message from Freada Bergeron, MD sent at 09/08/2021  9:09 AM EDT ----- ?Patient is very interested in GLP-1 if eligible. No contraindications.  ? ?Thank you all so much! ? ?-Heather ? ?

## 2021-09-11 DIAGNOSIS — M25652 Stiffness of left hip, not elsewhere classified: Secondary | ICD-10-CM | POA: Diagnosis not present

## 2021-09-11 DIAGNOSIS — M5117 Intervertebral disc disorders with radiculopathy, lumbosacral region: Secondary | ICD-10-CM | POA: Diagnosis not present

## 2021-09-11 DIAGNOSIS — M25651 Stiffness of right hip, not elsewhere classified: Secondary | ICD-10-CM | POA: Diagnosis not present

## 2021-09-11 DIAGNOSIS — R262 Difficulty in walking, not elsewhere classified: Secondary | ICD-10-CM | POA: Diagnosis not present

## 2021-09-12 ENCOUNTER — Encounter (HOSPITAL_BASED_OUTPATIENT_CLINIC_OR_DEPARTMENT_OTHER): Payer: PPO | Admitting: Internal Medicine

## 2021-09-16 ENCOUNTER — Encounter (HOSPITAL_BASED_OUTPATIENT_CLINIC_OR_DEPARTMENT_OTHER): Payer: PPO | Admitting: Internal Medicine

## 2021-09-16 DIAGNOSIS — L97522 Non-pressure chronic ulcer of other part of left foot with fat layer exposed: Secondary | ICD-10-CM

## 2021-09-16 NOTE — Progress Notes (Addendum)
RAYLEE, MADANI ([id number]) ?Visit Report for 09/16/2021 ?Arrival Information Details ?Patient Name: Date of Service: ?Alex Jackson. 09/16/2021 1:45 PM ?Medical Record Number: [id number] ?Patient Account Number: [id number] ?Date of Birth/Sex: Treating RN: ?08-02-1945 (76 y.o. [name] ?Primary Care Calab Sachse: [facility] Other Clinician: ?Referring Stuart Guillen: ?Treating Osmar Howton/Extender: Kalman Shan ?[facility] ?Weeks in Treatment: 16 ?Visit Information History Since Last Visit ?Added or deleted any medications: No ?Patient Arrived: [name] ?Any new allergies or adverse reactions: No ?Arrival Time: 13:58 ?Had a fall or experienced change in No ?Accompanied By: self ?activities of daily living that may affect ?Transfer Assistance: Manual ?risk of falls: ?Patient Identification Verified: Yes ?Signs or symptoms of abuse/neglect since last visito No ?Secondary Verification Process Completed: Yes ?Hospitalized since last visit: No ?Patient Requires Transmission-Based Precautions: No ?Implantable device outside of the clinic excluding No ?Patient Has Alerts: Yes ?cellular tissue based products placed in the center ?Patient Alerts: 11/18/20 ABI L1.04 since last visit: ?11/18/20 TBI L 1.35 Has Dressing in Place as Prescribed: Yes ?Pain Present Now: No ?Electronic Signature(s) ?Signed: 09/16/2021 5:12:39 PM By: Lorrin Grinnell ?Entered By: Lorrin Vonruden on 09/16/2021 13:58:58 ?-------------------------------------------------------------------------------- ?Complex / Palliative Patient Assessment Details ?Patient Name: Date of Service: ?Alex Jackson. 09/16/2021 1:45 PM ?Medical Record Number: [id number] ?Patient Account Number: [id number] ?Date of Birth/Sex: Treating RN: ?1946/03/09 (76 y.o. M) Rolin Barry, Bobbi ?Primary Care Valentin Benney: [facility] Other Clinician: ?Referring Sarita Hakanson: ?Treating Cecil Bixby/Extender: Kalman Shan ?[facility] ?Weeks in Treatment: 16 ?Complex Wound Management  Criteria ?Patient has remarkable or complex co-morbidities requiring medications or treatments that extend wound healing times. Examples: ?Diabetes mellitus with chronic renal failure or end stage renal disease requiring dialysis ?Advanced or poorly controlled rheumatoid arthritis ?Diabetes mellitus and end stage chronic obstructive pulmonary disease ?Active cancer with current chemo- or radiation therapy ?CAD, HTN, MI, NSTEMI, hyperlipidemia, DM type II, osteomyelitis ?Palliative Wound Management Criteria ?Care Approach ?Wound Care Plan: Complex Wound Management ?Electronic Signature(s) ?Signed: 09/17/2021 1:29:26 PM By: Deon Pilling RN, BSN ?Signed: 09/17/2021 4:38:19 PM By: Kalman Shan DO ?Entered By: Deon Pilling on 09/17/2021 13:29:25 ?-------------------------------------------------------------------------------- ?Encounter Discharge Information Details ?Patient Name: ?Date of Service: ?Alex Jackson. 09/16/2021 1:45 PM ?Medical Record Number: [id number] ?Patient Account Number: [id number] ?Date of Birth/Sex: ?Treating RN: ?02-04-1946 (76 y.o. [name] ?Primary Care Emanuell Morina: [facility] ?Other Clinician: ?Referring Masie Bermingham: ?Treating Brion Hedges/Extender: Kalman Shan ?[facility] ?Weeks in Treatment: 16 ?Encounter Discharge Information Items ?Discharge Condition: Stable ?Ambulatory Status: [name] ?Discharge Destination: Home ?Transportation: Private Auto ?Accompanied By: self ?Schedule Follow-up Appointment: Yes ?Clinical Summary of Care: Patient Declined ?Electronic Signature(s) ?Signed: 09/16/2021 5:12:39 PM By: Lorrin Radu ?Entered By: Lorrin Botero on 09/16/2021 14:11:19 ?-------------------------------------------------------------------------------- ?Lower Extremity Assessment Details ?Patient Name: ?Date of Service: ?Alex Jackson. 09/16/2021 1:45 PM ?Medical Record Number: [id number] ?Patient Account Number: [id number] ?Date of Birth/Sex: ?Treating RN: ?[birth year] (76  y.o. [name] ?Primary Care Kayleena Eke: [facility] ?Other Clinician: ?Referring Miklos Bidinger: ?Treating Athena Baltz/Extender: Kalman Shan ?[facility] ?Weeks in Treatment: 16 ?Edema Assessment ?Assessed: [Left: No] [Right: No] ?Edema: [Left: N] [Right: o] ?Calf ?Left: Right: ?Point of Measurement: 36 cm From Medial Instep 39.5 cm ?Ankle ?Left: Right: ?Point of Measurement: 12 cm From Medial Instep 24 cm ?Electronic Signature(s) ?Signed: 09/16/2021 5:12:39 PM By: Lorrin Popwell ?Entered By: Lorrin Termini on 09/16/2021 14:02:15 ?-------------------------------------------------------------------------------- ?Multi Wound Chart Details ?Patient Name: ?Date of Service: ?Alex Jackson. 09/16/2021 1:45 PM ?Medical Record Number: [id number] ?Patient  Account Number: [id number] ?Date of Birth/Sex: ?Treating RN: ?[birth year] (76 y.o. [name], [name] ?Primary Care Crystalee Ventress: [facility] ?Other Clinician: ?Referring Brady Plant: ?Treating Ramata Strothman/Extender: Kalman Shan ?[facility] ?Weeks in Treatment: 16 ?Vital Signs ?Height(in): 71 ?Pulse(bpm): 74 ?Weight(lbs): 252 ?Blood Pressure(mmHg): 113/66 ?Body Mass Index(BMI): 35.1 ?Temperature(??F): 99 ?Respiratory Rate(breaths/min): 17 ?Photos: [N/A:N/A] ?Left T Great ?oe N/A N/A ?Wound Location: ?Blister N/A N/A ?Wounding Event: ?Diabetic Wound/Ulcer of the Lower N/A N/A ?Primary Etiology: ?Extremity ?Cataracts, Coronary Artery Disease, N/A N/A ?Comorbid History: ?Hypertension, Myocardial Infarction, ?Type II Diabetes ?10/02/2020 N/A N/A ?Date Acquired: ?8 N/A N/A ?Weeks of Treatment: ?Open N/A N/A ?Wound Status: ?No N/A N/A ?Wound Recurrence: ?0.5x0.7x0.3 N/A N/A ?Measurements L x W x D (cm) ?0.275 N/A N/A ?A (cm?) : ?rea ?0.082 N/A N/A ?Volume (cm?) : ?2.80% N/A N/A ?% Reduction in A rea: ?27.40% N/A N/A ?% Reduction in Volume: ?9 ?Starting Position 1 (o'clock): ?2 ?Ending Position 1 (o'clock): ?0.3 ?Maximum Distance 1 (cm): ?Yes N/A N/A ?Undermining: ?Grade  3 N/A N/A ?Classification: ?Medium N/A N/A ?Exudate A mount: ?Serosanguineous N/A N/A ?Exudate Type: ?red, brown N/A N/A ?Exudate Color: ?Well defined, not attached N/A N/A ?Wound Margin: ?Large (67-100%) N/A N/A ?Granulation A mount: ?Red N/A N/A ?Granulation Quality: ?Small (1-33%) N/A N/A ?Necrotic A mount: ?Fat Layer (Subcutaneous Tissue): Yes N/A N/A ?Exposed Structures: ?Fascia: No ?Tendon: No ?Muscle: No ?Joint: No ?Bone: No ?None N/A N/A ?Epithelialization: ?Debridement - Excisional N/A N/A ?Debridement: ?Pre-procedure Verification/Time Out 14:15 N/A N/A ?Taken: ?Lidocaine N/A N/A ?Pain Control: ?Callus, Subcutaneous N/A N/A ?Tissue Debrided: ?Skin/Subcutaneous Tissue N/A N/A ?Level: ?0.35 N/A N/A ?Debridement A (sq cm): ?rea ?Curette N/A N/A ?Instrument: ?Minimum N/A N/A ?Bleeding: ?Pressure N/A N/A ?Hemostasis A chieved: ?0 N/A N/A ?Procedural Pain: ?0 N/A N/A ?Post Procedural Pain: ?Procedure was tolerated well N/A N/A ?Debridement Treatment Response: ?0.5x0.7x0.3 N/A N/A ?Post Debridement Measurements L x ?W x D (cm) ?0.082 N/A N/A ?Post Debridement Volume: (cm?) ?Debridement N/A N/A ?Procedures Performed: ?Treatment Notes ?Wound #1 (Toe Great) Wound Laterality: Left ?Cleanser ?Soap and Water ?Discharge Instruction: May shower and wash wound with dial antibacterial soap and water prior to dressing change. ?Peri-Wound Care ?Topical ?keystone ?Primary Dressing ?Secondary Dressing ?Optifoam Non-Adhesive Dressing, 4x4 in ?Discharge Instruction: Cut to make foam donut ?Woven Gauze Sponges 2x2 in ?Discharge Instruction: Apply over primary dressing and under the toe rolled gauze as directed. ?Secured With ?Conforming Stretch Gauze Bandage, Sterile 2x75 (in/in) ?Discharge Instruction: Secure with stretch gauze as directed. ?63M Medipore H Soft Cloth Surgical T ape, 4 x 10 (in/yd) ?Discharge Instruction: Secure with tape as directed. ?Compression Wrap ?Compression Stockings ?Add-Ons ?Electronic  Signature(s) ?Signed: 09/16/2021 2:55:25 PM By: Kalman Shan DO ?Signed: 09/16/2021 4:47:11 PM By: Rhae Hammock RN ?Entered By: Kalman Shan on 09/16/2021 14:43:49 ?---------------------------------------------------

## 2021-09-16 NOTE — Progress Notes (Signed)
Alex Jackson ([id number]) ?Visit Report for 09/16/2021 ?Chief Complaint Document Details ?Patient Name: Date of Service: ?Alex Jackson. 09/16/2021 1:45 PM ?Medical Record Number: [id number] ?Patient Account Number: [id number] ?Date of Birth/Sex: Treating RN: ?1945-08-01 (76 y.o. Alex Jackson, [name] ?Primary Care Provider: [name] Other Clinician: ?Referring Provider: ?Treating Provider/Extender: Geralyn Corwin ?[name] ?Weeks in Treatment: 16 ?Information Obtained from: Patient ?Chief Complaint ?Left great toe wound ?Electronic Signature(s) ?Signed: 09/16/2021 2:55:25 PM By: Geralyn Corwin DO ?Entered By: Geralyn Corwin on 09/16/2021 14:43:57 ?-------------------------------------------------------------------------------- ?Debridement Details ?Patient Name: Date of Service: ?Alex Jackson. 09/16/2021 1:45 PM ?Medical Record Number: [id number] ?Patient Account Number: [id number] ?Date of Birth/Sex: Treating RN: ?02-12-1946 (76 y.o. Alex Jackson ?Primary Care Provider: [name] Other Clinician: ?Referring Provider: ?Treating Provider/Extender: Geralyn Corwin ?[name] ?Weeks in Treatment: 16 ?Debridement Performed for Assessment: Wound #1 Left T Great ?oe ?Performed By: Physician Geralyn Corwin, DO ?Debridement Type: Debridement ?Severity of Tissue Pre Debridement: Fat layer exposed ?Level of Consciousness (Pre-procedure): Awake and Alert ?Pre-procedure Verification/Time Out Yes - 14:15 ?Taken: ?Start Time: 14:15 ?Pain Control: Lidocaine ?T Area Debrided (L x W): ?otal 0.5 (cm) x 0.7 (cm) = 0.35 (cm?) ?Tissue and other material debrided: ?Viable, Non-Viable, Callus, Subcutaneous, Skin: Dermis , Skin: Epidermis ?Level: Skin/Subcutaneous Tissue ?Debridement Description: Excisional ?Instrument: Curette ?Bleeding: Minimum ?Hemostasis Achieved: Pressure ?End Time: 14:15 ?Procedural Pain: 0 ?Post Procedural Pain: 0 ?Response to Treatment: Procedure was tolerated well ?Level  of Consciousness (Post- Awake and Alert ?procedure): ?Post Debridement Measurements of Total Wound ?Length: (cm) 0.5 ?Width: (cm) 0.7 ?Depth: (cm) 0.3 ?Volume: (cm?) 0.082 ?Character of Wound/Ulcer Post Debridement: Improved ?Severity of Tissue Post Debridement: Fat layer exposed ?Post Procedure Diagnosis ?Same as Pre-procedure ?Electronic Signature(s) ?Signed: 09/16/2021 2:55:25 PM By: Geralyn Corwin DO ?Signed: 09/16/2021 5:12:39 PM By: Antonieta Iba ?Entered By: Antonieta Iba on 09/16/2021 14:16:56 ?-------------------------------------------------------------------------------- ?HPI Details ?Patient Name: Date of Service: ?Alex Jackson. 09/16/2021 1:45 PM ?Medical Record Number: [id number] ?Patient Account Number: [id number] ?Date of Birth/Sex: Treating RN: ?1945/10/16 (76 y.o. Alex Jackson, [name] ?Primary Care Provider: [name] Other Clinician: ?Referring Provider: ?Treating Provider/Extender: Geralyn Corwin ?[name] ?Weeks in Treatment: 16 ?History of Present Illness ?HPI Description: Admission 05/26/2021 ?Mr. Alex Jackson is a 76 year old male with a past medical history of controlled type 2 diabetes on oral agents, chronic back pain and coronary artery ?disease status post stenting to the LAD that presents to the clinic for a 76-month history of nonhealing wound to the left great toe plantar aspect. He has ?peripheral neuropathy and is not sure how the wound developed. He has been following with Dr. Victorino Dike for this issue and has been using Betadine to the area ?daily. He does not offload the area. He denies signs of infection. ?2/3; patient presents for follow-up. He obtained his left foot x-ray. He has been using gentamicin with hydrofera blue toe to the wound bed with dressing ?changes. He has no issues or complaints today. He denies signs of infection. ?2/17; patient presents for follow-up. He had his MRI that showed bone marrow edema at the distal aspect concerning for  osteomyelitis. Currently patient denies ?signs of infection. He feels well and has been using gentamicin with Hydrofera Blue to the wound bed. He states that he has ordered custom diabetic shoes. ?He obtains these at the end of the month. ?3/3; patient presents for follow-up. He had a scheduled appointment with Dr. Orvan Falconer, infectious disease yesterday. Patient canceled and at  this time does ?not want to follow-up with any other doctors for this issue. He currently denies signs of infection. He has been using gentamicin ointment with Hydrofera Blue ?to the wound bed. He has orthopedic shoes and has an area that offloads the left great toe. ?3/17; patient presents for follow-up. He reports stubbing his left great toe that has the wound. He has been using gentamicin ointment and Hydrofera Blue with ?dressing changes. He has orthopedic shoes that offload the left great toe however he is not wearing them today and he confirms that he does not wear these ?every day. ?3/27; patient presents for follow-up. He has his surgical shoe with a cut out to the left great toe. Unfortunately his foot is shifting in the shoe and pressure ?continues as noted by the drainage location. He continues to use gentamicin ointment with Hydrofera Blue. He denies signs of infection. He reports less ?drainage to the wound site. ?5/5; patient presents for follow-up. Unfortunately the patient did not follow-up as recommended. I have not seen this patient in over 4 weeks. He has no issues ?or complaints today. He states he is using gentamicin ointment and Hydrofera Blue however the blue dressing is not staying in place. He denies signs of ?systemic infection. ?5/16; patient presents for follow-up. He obtained his [facility] antibiotics and has been using this daily for the past week. He reports improvement in wound ?healing. Due to financial constraints he states he would only be able to follow-up every 3 weeks. ?Electronic Signature(s) ?Signed:  09/16/2021 2:55:25 PM By: Geralyn Corwin DO ?Entered By: Geralyn Corwin on 09/16/2021 14:44:31 ?-------------------------------------------------------------------------------- ?Physical Exam Details ?Patient Name: Date of Service: ?Alex Jackson. 09/16/2021 1:45 PM ?Medical Record Number: [id number] ?Patient Account Number: [id number] ?Date of Birth/Sex: Treating RN: ?28-Mar-1946 (76 y.o. Alex Jackson, [name] ?Primary Care Provider: [name] Other Clinician: ?Referring Provider: ?Treating Provider/Extender: Geralyn Corwin ?[name] ?Weeks in Treatment: 16 ?Constitutional ?respirations regular, non-labored and within target range for patient.Marland Kitchen ?Cardiovascular ?2+ dorsalis pedis/posterior tibialis pulses. ?Psychiatric ?pleasant and cooperative. ?Notes ?Left great toe: T the plantar aspect there is an open wound with granulation tissue, nonviable tissue and circumferential callus. No signs of surrounding ?o ?infection. ?Electronic Signature(s) ?Signed: 09/16/2021 2:55:25 PM By: Geralyn Corwin DO ?Entered By: Geralyn Corwin on 09/16/2021 14:45:07 ?-------------------------------------------------------------------------------- ?Physician Orders Details ?Patient Name: Date of Service: ?Alex Jackson. 09/16/2021 1:45 PM ?Medical Record Number: [id number] ?Patient Account Number: [id number] ?Date of Birth/Sex: Treating RN: ?11-28-1945 (76 y.o. Alex Jackson ?Primary Care Provider: [name] Other Clinician: ?Referring Provider: ?Treating Provider/Extender: Geralyn Corwin ?[name] ?Weeks in Treatment: 16 ?Verbal / Phone Orders: No ?Diagnosis Coding ?Follow-up Appointments ?Return appointment in 3 weeks. - Dr. Mikey Bussing and Maryruth Bun Room # 9 ?Other: - [employer]- wound care supplier ?Bathing/ Shower/ Hygiene ?May shower and wash wound with soap and water. - with dressing changes. ?Edema Control - Lymphedema / SCD / Other ?Elevate legs to the level of the heart or above for 30  minutes daily and/or when sitting, a frequency of: - 3-4 times a day throughout the day. ?Avoid standing for long periods of time. ?Moisturize legs daily. - every night before bed. ?Off-Loading ?Other: - Continue to w

## 2021-09-18 ENCOUNTER — Encounter: Payer: Self-pay | Admitting: Pharmacist

## 2021-09-18 ENCOUNTER — Ambulatory Visit (INDEPENDENT_AMBULATORY_CARE_PROVIDER_SITE_OTHER): Payer: PPO | Admitting: Pharmacist

## 2021-09-18 VITALS — BP 131/75 | HR 71 | Wt 259.0 lb

## 2021-09-18 DIAGNOSIS — E119 Type 2 diabetes mellitus without complications: Secondary | ICD-10-CM | POA: Diagnosis not present

## 2021-09-18 DIAGNOSIS — M5117 Intervertebral disc disorders with radiculopathy, lumbosacral region: Secondary | ICD-10-CM | POA: Diagnosis not present

## 2021-09-18 DIAGNOSIS — I10 Essential (primary) hypertension: Secondary | ICD-10-CM | POA: Diagnosis not present

## 2021-09-18 DIAGNOSIS — M25651 Stiffness of right hip, not elsewhere classified: Secondary | ICD-10-CM | POA: Diagnosis not present

## 2021-09-18 DIAGNOSIS — R262 Difficulty in walking, not elsewhere classified: Secondary | ICD-10-CM | POA: Diagnosis not present

## 2021-09-18 DIAGNOSIS — M25652 Stiffness of left hip, not elsewhere classified: Secondary | ICD-10-CM | POA: Diagnosis not present

## 2021-09-18 MED ORDER — OZEMPIC (0.25 OR 0.5 MG/DOSE) 2 MG/1.5ML ~~LOC~~ SOPN: 0.5000 mg | PEN_INJECTOR | SUBCUTANEOUS | 1 refills | Status: DC

## 2021-09-18 NOTE — Patient Instructions (Addendum)
It was nice meeting you today  We would like to start you on a new medication called Ozempic  Your dose will be 0.25mg  once a week for 4 weeks  On week 5, you will increase your dose to 0.5mg  once a week  The medication increases to 1.0mg  and 2.0mg .  Please call or send a message when you would like to increase your dose  I will complete the prior authorization for you and contact you when it is approved  Feel free to go to [email or web address] if you need more information on how how to inject  Please call with any questions  Laural Golden, PharmD, BCACP, CDCES, CPP [address] [place], [place], [address] Phone: [phone], Fax: [phone]

## 2021-09-18 NOTE — Progress Notes (Signed)
Patient ID: Alex PrestoRobert W Liberman                 DOB: 29-Jul-1945                    MRN: [id number]     HPI: Alex Jackson is a 76 y.o. male patient referred to pharmacy clinic by Dr Shari ProwsPemberton to initiate weight loss therapy with GLP1-RA. PMH is significant for obesity, DM, NSTEMI, HLD and HTN. Most recent BMI 36.12.  Patient presents today in good spirits. Ambulates with a walker due to toe injury which then caused a meniscus tear. Is not able to exercise and struggles walking his dog.  Lives alone. Eats a large breakfast but does not have large meals for the rest of the day.  Currently on glipizide and metformin for T2DM.  Most recent A1c 5.8, does not check blood sugar at home. A1c 7.9 in June 2022.  Current weight management medications: n/a  Previously tried meds: n/a  Current meds that may affect weight: Glipizide  Baseline weight/BMI: 36.12  Insurance payor: [employer]   Labs: Lab Results  Component Value Date   HGBA1C 6.1 (H) 09/26/2017    Wt Readings from Last 1 Encounters:  09/08/21 255 lb (115.7 kg)    BP Readings from Last 1 Encounters:  09/08/21 138/82   Pulse Readings from Last 1 Encounters:  09/08/21 73       Component Value Date/Time   CHOL 141 06/26/2019 0845   TRIG 242 (H) 06/26/2019 0845   HDL 32 (L) 06/26/2019 0845   CHOLHDL 4.4 06/26/2019 0845   CHOLHDL 5.4 09/27/2017 0222   VLDL UNABLE TO CALCULATE IF TRIGLYCERIDE OVER 400 mg/dL 40/98/119105/27/2019 47820222   LDLCALC 69 06/26/2019 0845    Past Medical History:  Diagnosis Date   CAD in native artery 03/01/2015   1. cath - DES to LAD residual non obstructive RCA disease in 2016 b. Cath s/p Successful PCI with stenting of the distal RCA into the PLOM with a DES. Balloon angioplasty of the PDA through the stent struts.    Diabetes mellitus without complication (HCC)    Edema of both lower extremities    Hyperlipidemia    Hypertension    Leg weakness    Overweight    Spinal stenosis    Ulcer  of great toe (HCC)     Current Outpatient Medications on File Prior to Visit  Medication Sig Dispense Refill   acetaminophen (TYLENOL) 325 MG tablet Take 2 tablets (650 mg total) by mouth every 4 (four) hours as needed for headache or mild pain.     aspirin EC 81 MG tablet Take 81 mg by mouth daily.     bisoprolol (ZEBETA) 10 MG tablet TAKE ONE TABLET BY MOUTH ONCE DAILY 90 tablet 3   chlorthalidone (HYGROTON) 25 MG tablet Take 1 tablet (25 mg total) by mouth daily. Please make yearly appt with new Cardiologist Dr. Shari ProwsPemberton for June 2023 before anymore refills. Thank you 1st attempt 90 tablet 0   clindamycin (CLEOCIN) 150 MG capsule Take 1 capsule (150 mg total) by mouth every 6 (six) hours. 28 capsule 0   cyclobenzaprine (FLEXERIL) 10 MG tablet Take 1 tablet (10 mg total) by mouth 2 (two) times daily as needed for up to 20 doses for muscle spasms. 20 tablet 0   fluticasone (FLONASE) 50 MCG/ACT nasal spray USE 1 SPRAY IN EACH NOSTRIL EVERY DAY  4   furosemide (LASIX) 40 MG tablet  Take 1 tablet (40 mg total) by mouth daily. 90 tablet 3   hydroxypropyl methylcellulose / hypromellose (ISOPTO TEARS / GONIOVISC) 2.5 % ophthalmic solution Place 1 drop into both eyes as needed for dry eyes.     lisinopril (ZESTRIL) 40 MG tablet Take 1 tablet (40 mg total) by mouth daily. Please make yearly appt with new Cardiologist Dr. Shari Prows for June 2023 before anymore refills. Thank you 1st attempt 90 tablet 0   loteprednol (LOTEMAX) 0.5 % ophthalmic suspension      metFORMIN (GLUCOPHAGE) 500 MG tablet Take 1 tablet (500 mg total) by mouth 2 (two) times daily.  5   Multiple Vitamin (MULTIVITAMIN WITH MINERALS) TABS tablet Take 1 tablet by mouth daily.     nitroGLYCERIN (NITROSTAT) 0.4 MG SL tablet Place 1 tablet (0.4 mg total) under the tongue every 5 (five) minutes x 3 doses as needed for chest pain. 25 tablet 4   omega-3 acid ethyl esters (LOVAZA) 1 g capsule TAKE ONE CAPSULE BY MOUTH EVERY MORNING and TAKE ONE  CAPSULE BY MOUTH EVERYDAY AT BEDTIME 180 capsule 3   pantoprazole (PROTONIX) 40 MG tablet Take 1 tablet (40 mg total) by mouth daily. Please make yearly appt with Dr. Shari Prows new Cardiologist for June 2023 before anymore refills. Thank you 1st attempt 90 tablet 0   pregabalin (LYRICA) 75 MG capsule Take 1 capsule (75 mg total) by mouth 2 (two) times daily. 60 capsule 0   rosuvastatin (CRESTOR) 10 MG tablet Take 1 tablet (10 mg total) by mouth daily. 30 tablet 0   No current facility-administered medications on file prior to visit.    Allergies  Allergen Reactions   Prednisone Other (See Comments)    Makes him crazy   Atorvastatin Other (See Comments)    Pt reports "causes lower extremity muscle aches."   Pravastatin Other (See Comments)    Pt reports "causes bilateral calf cramping."     Assessment/Plan:  1. Weight loss/DM - Patient A1c 5.8 which is at goal of <7.0%. However due to recent lower extremity injuries is not able to exercise and has been gaining weight. Is tolerating metformin and glipizide but would benefit from Ozempic.    Using [employer], educated patient on mechanism of action, storage, site selection, priming the pen, dialing dose, and administration.  Recommended he d/c glipizide at this time due to risk of hypoglycemia and weight gain.  Confirmed patient  has no personal or family history of medullary thyroid carcinoma (MTC) or Multiple Endocrine Neoplasia syndrome type 2 (MEN 2). Will complete prior authorization and contact patient when approved. Patient will report back after 2 months if he would like to increase dose to 1.0mg .  Advised patient on common side effects including nausea, diarrhea, dyspepsia, decreased appetite, and fatigue. Counseled patient on reducing meal size and how to titrate medication to minimize side effects. Counseled patient to call if intolerable side effects or if experiencing dehydration, abdominal pain, or dizziness. Patient will  adhere to dietary modifications and will target at least 150 minutes of moderate intensity exercise weekly.    Titration Plan:  Will plan to follow the titration plan as below, pending patient is tolerating each dose before increasing to the next. Can slow titration if needed for tolerability.    -Month 1: Inject 0.25 SQ once weekly x 4 weeks -Month 2: Inject 0.5 SQ once weekly x 4 weeks  Laural Golden, PharmD, BCACP, CDCES, CPP [address], [address] [place], [place], [address] Phone: [phone],  Fax: [phone]

## 2021-09-19 ENCOUNTER — Ambulatory Visit (HOSPITAL_BASED_OUTPATIENT_CLINIC_OR_DEPARTMENT_OTHER): Payer: PPO | Admitting: Internal Medicine

## 2021-09-19 ENCOUNTER — Encounter (HOSPITAL_BASED_OUTPATIENT_CLINIC_OR_DEPARTMENT_OTHER): Payer: PPO | Admitting: Internal Medicine

## 2021-09-22 ENCOUNTER — Other Ambulatory Visit: Payer: Self-pay | Admitting: Neurological Surgery

## 2021-09-22 ENCOUNTER — Ambulatory Visit
Admission: RE | Admit: 2021-09-22 | Discharge: 2021-09-22 | Disposition: A | Discharge status: Home / Self Care | Payer: PPO | Source: Ambulatory Visit | Attending: Neurological Surgery | Admitting: Neurological Surgery

## 2021-09-22 DIAGNOSIS — M5416 Radiculopathy, lumbar region: Secondary | ICD-10-CM

## 2021-09-22 DIAGNOSIS — M533 Sacrococcygeal disorders, not elsewhere classified: Secondary | ICD-10-CM | POA: Diagnosis not present

## 2021-09-22 MED ORDER — IOPAMIDOL (ISOVUE-M 200) INJECTION 41%
1.0000 mL | Freq: Once | INTRAMUSCULAR | Status: AC
  Administered 2021-09-22: 1 mL via INTRA_ARTICULAR

## 2021-09-22 MED ORDER — METHYLPREDNISOLONE ACETATE 40 MG/ML INJ SUSP (RADIOLOG: 80.0000 mg | Freq: Once | INTRAMUSCULAR | Status: DC

## 2021-09-22 MED ORDER — IOPAMIDOL (ISOVUE-M 200) INJECTION 41%: 1.0000 mL | Freq: Once | INTRAMUSCULAR | Status: DC

## 2021-09-22 MED ORDER — METHYLPREDNISOLONE ACETATE 40 MG/ML INJ SUSP (RADIOLOG
80.0000 mg | Freq: Once | INTRAMUSCULAR | Status: AC
  Administered 2021-09-22: 80 mg via INTRA_ARTICULAR

## 2021-09-22 NOTE — Discharge Instructions (Signed)

## 2021-09-23 ENCOUNTER — Other Ambulatory Visit: Payer: Self-pay

## 2021-09-23 DIAGNOSIS — I10 Essential (primary) hypertension: Secondary | ICD-10-CM | POA: Diagnosis not present

## 2021-09-23 DIAGNOSIS — M25651 Stiffness of right hip, not elsewhere classified: Secondary | ICD-10-CM | POA: Diagnosis not present

## 2021-09-23 DIAGNOSIS — R262 Difficulty in walking, not elsewhere classified: Secondary | ICD-10-CM | POA: Diagnosis not present

## 2021-09-23 DIAGNOSIS — E1169 Type 2 diabetes mellitus with other specified complication: Secondary | ICD-10-CM | POA: Diagnosis not present

## 2021-09-23 DIAGNOSIS — M25652 Stiffness of left hip, not elsewhere classified: Secondary | ICD-10-CM | POA: Diagnosis not present

## 2021-09-23 DIAGNOSIS — M5117 Intervertebral disc disorders with radiculopathy, lumbosacral region: Secondary | ICD-10-CM | POA: Diagnosis not present

## 2021-09-23 DIAGNOSIS — E785 Hyperlipidemia, unspecified: Secondary | ICD-10-CM | POA: Diagnosis not present

## 2021-09-23 MED ORDER — ROSUVASTATIN CALCIUM 10 MG PO TABS: 10.0000 mg | ORAL_TABLET | Freq: Every day | ORAL | 3 refills | Status: DC

## 2021-09-25 DIAGNOSIS — M25652 Stiffness of left hip, not elsewhere classified: Secondary | ICD-10-CM | POA: Diagnosis not present

## 2021-09-25 DIAGNOSIS — R262 Difficulty in walking, not elsewhere classified: Secondary | ICD-10-CM | POA: Diagnosis not present

## 2021-09-25 DIAGNOSIS — M25651 Stiffness of right hip, not elsewhere classified: Secondary | ICD-10-CM | POA: Diagnosis not present

## 2021-09-25 DIAGNOSIS — M5117 Intervertebral disc disorders with radiculopathy, lumbosacral region: Secondary | ICD-10-CM | POA: Diagnosis not present

## 2021-10-01 DIAGNOSIS — R262 Difficulty in walking, not elsewhere classified: Secondary | ICD-10-CM | POA: Diagnosis not present

## 2021-10-01 DIAGNOSIS — M5117 Intervertebral disc disorders with radiculopathy, lumbosacral region: Secondary | ICD-10-CM | POA: Diagnosis not present

## 2021-10-01 DIAGNOSIS — M25651 Stiffness of right hip, not elsewhere classified: Secondary | ICD-10-CM | POA: Diagnosis not present

## 2021-10-01 DIAGNOSIS — M25652 Stiffness of left hip, not elsewhere classified: Secondary | ICD-10-CM | POA: Diagnosis not present

## 2021-10-07 ENCOUNTER — Encounter (HOSPITAL_BASED_OUTPATIENT_CLINIC_OR_DEPARTMENT_OTHER): Payer: Self-pay

## 2021-10-07 ENCOUNTER — Encounter (HOSPITAL_BASED_OUTPATIENT_CLINIC_OR_DEPARTMENT_OTHER): Payer: PPO | Admitting: General Surgery

## 2021-10-13 ENCOUNTER — Other Ambulatory Visit: Payer: Self-pay | Admitting: Physician Assistant

## 2021-10-13 ENCOUNTER — Other Ambulatory Visit: Payer: Self-pay | Admitting: Neurological Surgery

## 2021-10-13 ENCOUNTER — Ambulatory Visit
Admission: RE | Admit: 2021-10-13 | Discharge: 2021-10-13 | Disposition: A | Discharge status: Home / Self Care | Payer: PPO | Source: Ambulatory Visit | Attending: Neurological Surgery | Admitting: Neurological Surgery

## 2021-10-13 DIAGNOSIS — M47816 Spondylosis without myelopathy or radiculopathy, lumbar region: Secondary | ICD-10-CM | POA: Diagnosis not present

## 2021-10-13 DIAGNOSIS — I2510 Atherosclerotic heart disease of native coronary artery without angina pectoris: Secondary | ICD-10-CM

## 2021-10-13 DIAGNOSIS — E785 Hyperlipidemia, unspecified: Secondary | ICD-10-CM

## 2021-10-13 DIAGNOSIS — I25110 Atherosclerotic heart disease of native coronary artery with unstable angina pectoris: Secondary | ICD-10-CM

## 2021-10-13 DIAGNOSIS — M5416 Radiculopathy, lumbar region: Secondary | ICD-10-CM

## 2021-10-13 DIAGNOSIS — I10 Essential (primary) hypertension: Secondary | ICD-10-CM

## 2021-10-13 DIAGNOSIS — I255 Ischemic cardiomyopathy: Secondary | ICD-10-CM

## 2021-10-13 DIAGNOSIS — Z955 Presence of coronary angioplasty implant and graft: Secondary | ICD-10-CM

## 2021-10-13 MED ORDER — IOPAMIDOL (ISOVUE-M 200) INJECTION 41%
1.0000 mL | Freq: Once | INTRAMUSCULAR | Status: DC
Start: 2021-10-13 — End: 2021-10-13

## 2021-10-13 MED ORDER — METHYLPREDNISOLONE ACETATE 40 MG/ML INJ SUSP (RADIOLOG
80.0000 mg | Freq: Once | INTRAMUSCULAR | Status: AC
  Administered 2021-10-13: 80 mg via EPIDURAL

## 2021-10-13 MED ORDER — METHYLPREDNISOLONE ACETATE 40 MG/ML INJ SUSP (RADIOLOG: 80.0000 mg | Freq: Once | INTRAMUSCULAR | Status: DC

## 2021-10-13 MED ORDER — IOPAMIDOL (ISOVUE-M 200) INJECTION 41%
1.0000 mL | Freq: Once | INTRAMUSCULAR | Status: AC
  Administered 2021-10-13: 1 mL via EPIDURAL

## 2021-10-13 NOTE — Discharge Instructions (Signed)

## 2021-10-15 DIAGNOSIS — M25651 Stiffness of right hip, not elsewhere classified: Secondary | ICD-10-CM | POA: Diagnosis not present

## 2021-10-15 DIAGNOSIS — M25652 Stiffness of left hip, not elsewhere classified: Secondary | ICD-10-CM | POA: Diagnosis not present

## 2021-10-15 DIAGNOSIS — R262 Difficulty in walking, not elsewhere classified: Secondary | ICD-10-CM | POA: Diagnosis not present

## 2021-10-15 DIAGNOSIS — M5117 Intervertebral disc disorders with radiculopathy, lumbosacral region: Secondary | ICD-10-CM | POA: Diagnosis not present

## 2021-10-16 ENCOUNTER — Encounter (HOSPITAL_BASED_OUTPATIENT_CLINIC_OR_DEPARTMENT_OTHER): Payer: PPO | Attending: Internal Medicine | Admitting: Internal Medicine

## 2021-10-16 DIAGNOSIS — E785 Hyperlipidemia, unspecified: Secondary | ICD-10-CM | POA: Diagnosis not present

## 2021-10-16 DIAGNOSIS — E11621 Type 2 diabetes mellitus with foot ulcer: Secondary | ICD-10-CM | POA: Diagnosis not present

## 2021-10-16 DIAGNOSIS — Z955 Presence of coronary angioplasty implant and graft: Secondary | ICD-10-CM | POA: Diagnosis not present

## 2021-10-16 DIAGNOSIS — I2510 Atherosclerotic heart disease of native coronary artery without angina pectoris: Secondary | ICD-10-CM | POA: Diagnosis not present

## 2021-10-16 DIAGNOSIS — M5117 Intervertebral disc disorders with radiculopathy, lumbosacral region: Secondary | ICD-10-CM | POA: Diagnosis not present

## 2021-10-16 DIAGNOSIS — L97522 Non-pressure chronic ulcer of other part of left foot with fat layer exposed: Secondary | ICD-10-CM | POA: Insufficient documentation

## 2021-10-16 DIAGNOSIS — M545 Low back pain, unspecified: Secondary | ICD-10-CM | POA: Insufficient documentation

## 2021-10-16 DIAGNOSIS — Z87891 Personal history of nicotine dependence: Secondary | ICD-10-CM | POA: Diagnosis not present

## 2021-10-16 DIAGNOSIS — M25652 Stiffness of left hip, not elsewhere classified: Secondary | ICD-10-CM | POA: Diagnosis not present

## 2021-10-16 DIAGNOSIS — R262 Difficulty in walking, not elsewhere classified: Secondary | ICD-10-CM | POA: Diagnosis not present

## 2021-10-16 DIAGNOSIS — M25651 Stiffness of right hip, not elsewhere classified: Secondary | ICD-10-CM | POA: Diagnosis not present

## 2021-10-16 NOTE — Progress Notes (Signed)
DUTCH, PIECH ([id number]) Visit Report for 10/16/2021 Chief Complaint Document Details Patient Name: Date of Service: Alex Jackson. 10/16/2021 9:15 A M Medical Record Number: [id number] Patient Account Number: [id number] Date of Birth/Sex: Treating RN: Apr 17, 1946 (76 y.o. Alex Jackson, Alex Jackson Primary Care Provider: London Pepper Other Clinician: Referring Provider: Treating Provider/Extender: Merilyn Baba in Treatment: 20 Information Obtained from: Patient Chief Complaint Left great toe wound Electronic Signature(s) Signed: 10/16/2021 11:40:05 AM By: Kalman Shan DO Entered By: Kalman Shan on 10/16/2021 11:29:47 -------------------------------------------------------------------------------- Debridement Details Patient Name: Date of Service: Alex Jackson, Alex BERT W. 10/16/2021 9:15 A M Medical Record Number: [id number] Patient Account Number: [id number] Date of Birth/Sex: Treating RN: 28-Oct-1945 (76 y.o. Alex Jackson, Alex Jackson Primary Care Provider: London Pepper Other Clinician: Referring Provider: Treating Provider/Extender: Merilyn Baba in Treatment: 20 Debridement Performed for Assessment: Wound #1 Left T Great oe Performed By: Physician Kalman Shan, DO Debridement Type: Debridement Severity of Tissue Pre Debridement: Fat layer exposed Level of Consciousness (Pre-procedure): Awake and Alert Pre-procedure Verification/Time Out Yes - 09:50 Taken: Start Time: 09:50 Pain Control: Lidocaine T Area Debrided (L x W): otal 0.7 (cm) x 0.4 (cm) = 0.28 (cm) Tissue and other material debrided: Viable, Non-Viable, Slough, Subcutaneous, Slough Level: Skin/Subcutaneous Tissue Debridement Description: Excisional Instrument: Curette Bleeding: Minimum Hemostasis Achieved: Pressure End Time: 09:50 Procedural Pain: 0 Post Procedural Pain: 0 Response to Treatment: Procedure was tolerated well Level of Consciousness  (Post- Awake and Alert procedure): Post Debridement Measurements of Total Wound Length: (cm) 1.5 Width: (cm) 1.6 Depth: (cm) 0.5 Volume: (cm) 0.942 Character of Wound/Ulcer Post Debridement: Improved Severity of Tissue Post Debridement: Fat layer exposed Post Procedure Diagnosis Same as Pre-procedure Electronic Signature(s) Signed: 10/16/2021 11:40:05 AM By: Kalman Shan DO Signed: 10/16/2021 5:29:07 PM By: Rhae Hammock RN Entered By: Rhae Hammock on 10/16/2021 09:57:07 -------------------------------------------------------------------------------- HPI Details Patient Name: Date of Service: Alex Jackson, Alex BERT W. 10/16/2021 9:15 A M Medical Record Number: [id number] Patient Account Number: [id number] Date of Birth/Sex: Treating RN: March 25, 1946 (76 y.o. Alex Jackson Primary Care Provider: London Pepper Other Clinician: Referring Provider: Treating Provider/Extender: Merilyn Baba in Treatment: 20 History of Present Illness HPI Description: Admission 05/26/2021 Alex Jackson is a 76 year old male with a past medical history of controlled type 2 diabetes on oral agents, chronic back pain and coronary artery disease status post stenting to the LAD that presents to the clinic for a 34-month history of nonhealing wound to the left great toe plantar aspect. He has peripheral neuropathy and is not sure how the wound developed. He has been following with Dr. Doran Durand for this issue and has been using Betadine to the area daily. He does not offload the area. He denies signs of infection. 2/3; patient presents for follow-up. He obtained his left foot x-ray. He has been using gentamicin with hydrofera blue toe to the wound bed with dressing changes. He has no issues or complaints today. He denies signs of infection. 2/17; patient presents for follow-up. He had his MRI that showed bone marrow edema at the distal aspect concerning for osteomyelitis.  Currently patient denies signs of infection. He feels well and has been using gentamicin with Hydrofera Blue to the wound bed. He states that he has ordered custom diabetic shoes. He obtains these at the end of the month. 3/3; patient presents for follow-up. He had a scheduled appointment with Dr. Megan Salon, infectious disease yesterday. Patient canceled and at  this time does not want to follow-up with any other doctors for this issue. He currently denies signs of infection. He has been using gentamicin ointment with Hydrofera Blue to the wound bed. He has orthopedic shoes and has an area that offloads the left great toe. 3/17; patient presents for follow-up. He reports stubbing his left great toe that has the wound. He has been using gentamicin ointment and Hydrofera Blue with dressing changes. He has orthopedic shoes that offload the left great toe however he is not wearing them today and he confirms that he does not wear these every day. 3/27; patient presents for follow-up. He has his surgical shoe with a cut out to the left great toe. Unfortunately his foot is shifting in the shoe and pressure continues as noted by the drainage location. He continues to use gentamicin ointment with Hydrofera Blue. He denies signs of infection. He reports less drainage to the wound site. 5/5; patient presents for follow-up. Unfortunately the patient did not follow-up as recommended. I have not seen this patient in over 4 weeks. He has no issues or complaints today. He states he is using gentamicin ointment and Hydrofera Blue however the blue dressing is not staying in place. He denies signs of systemic infection. 5/16; patient presents for follow-up. He obtained his Keystone antibiotics and has been using this daily for the past week. He reports improvement in wound healing. Due to financial constraints he states he would only be able to follow-up every 3 weeks. 6/15; patient presents for follow-up. He has been  using Keystone antibiotic ointment daily. It is unclear if he is able to do this properly since he cannot reach his feet very easily. He has nobody to help him with dressing changes. He had an MRI of his left foot completed on 06/14/2021 that showed changes concerning for osteomyelitis to the left great toe. At that time I had referred him to infectious disease however he did not show up to his appointment and did not want to follow-up at that time. Today he states he would like to see infectious disease. We set up an appointment for him next Thursday at 61 with Dr. Megan Salon. He currently denies signs of infection. He is not able to aggressively offload this area. He is open to doing the total contact cast. Electronic Signature(s) Signed: 10/16/2021 11:40:05 AM By: Kalman Shan DO Entered By: Kalman Shan on 10/16/2021 11:33:04 -------------------------------------------------------------------------------- Physical Exam Details Patient Name: Date of Service: [name] 10/16/2021 9:15 A M Medical Record Number: [id number] Patient Account Number: [id number] Date of Birth/Sex: Treating RN: 1946/03/13 (76 y.o. Alex Jackson Primary Care Provider: London Pepper Other Clinician: Referring Provider: Treating Provider/Extender: Merilyn Baba in Treatment: 20 Constitutional respirations regular, non-labored and within target range for patient.. Cardiovascular 2+ dorsalis pedis/posterior tibialis pulses. Psychiatric pleasant and cooperative. Notes Left great toe: T the plantar aspect there is an open wound with granulation tissue, nonviable tissue and circumferential callus. No signs of surrounding o infection. Electronic Signature(s) Signed: 10/16/2021 11:40:05 AM By: Kalman Shan DO Entered By: Kalman Shan on 10/16/2021 11:33:33 -------------------------------------------------------------------------------- Physician Orders  Details Patient Name: Date of Service: Alex Jackson, Alex BERT W. 10/16/2021 9:15 A M Medical Record Number: [id number] Patient Account Number: [id number] Date of Birth/Sex: Treating RN: [birth year] (76 y.o. Alex Jackson Primary Care Provider: London Pepper Other Clinician: Referring Provider: Treating Provider/Extender: Merilyn Baba in Treatment: 20 Verbal / Phone Orders: No Diagnosis  Coding Follow-up Appointments ppointment in 1 week. - on Friday 10/24/21 @ 0915 w/ Dr. Tod Persia and Maryruth Bun Room # 9 Return A ***New pt. appt. w/ Dr. Orvan Falconer Thursday 10/23/21 @ 0930**** Bathing/ Shower/ Hygiene May shower and wash wound with soap and water. - with dressing changes. Edema Control - Lymphedema / SCD / Other Elevate legs to the level of the heart or above for 30 minutes daily and/or when sitting, a frequency of: - 3-4 times a day throughout the day. Avoid standing for long periods of time. Moisturize legs daily. - every night before bed. Off-Loading Other: - Continue to wear Surgical Shoe with offloading felt Wound Treatment Wound #1 - T Great oe Wound Laterality: Left Cleanser: Soap and Water 1 x Per Day/15 Days Discharge Instructions: May shower and wash wound with dial antibacterial soap and water prior to dressing change. Topical: keystone 1 x Per Day/15 Days Secondary Dressing: Optifoam Non-Adhesive Dressing, 4x4 in (DME) (Generic) 1 x Per Day/15 Days Discharge Instructions: Cut to make foam donut Secondary Dressing: Woven Gauze Sponges 2x2 in 1 x Per Day/15 Days Discharge Instructions: Apply over primary dressing and under the toe rolled gauze as directed. Secured With: Insurance underwriter, Sterile 2x75 (in/in) 1 x Per Day/15 Days Discharge Instructions: Secure with stretch gauze as directed. Secured With: 35M Medipore H Soft Cloth Surgical T ape, 4 x 10 (in/yd) 1 x Per Day/15 Days Discharge Instructions: Secure with tape as  directed. Consults Infectious Disease - Refer again to Infectious Disease for +osteo to left great toe with non-healing ulcer. Electronic Signature(s) Signed: 10/16/2021 5:03:07 PM By: Geralyn Corwin DO Signed: 10/16/2021 5:29:07 PM By: Fonnie Mu RN Previous Signature: 10/16/2021 11:40:05 AM Version By: Geralyn Corwin DO Entered By: Fonnie Mu on 10/16/2021 11:46:12 Prescription 10/16/2021 -------------------------------------------------------------------------------- Vivien Presto. Geralyn Corwin DO Patient Name: Provider: [birth year] [id number] Date of Birth: NPI#: Judie Petit [id number] Sex: DEA #: [id number] Phone #: License #: Eligha Bridegroom [facility] Patient Address: [address] [place], [place] [address] [address] [place], [place] [address] [phone] Allergies prednisone Provider's Orders Infectious Disease - Refer again to Infectious Disease for +osteo to left great toe with non-healing ulcer. Hand Signature: Date(s): Electronic Signature(s) Signed: 10/16/2021 5:03:07 PM By: Geralyn Corwin DO Signed: 10/16/2021 5:29:07 PM By: Fonnie Mu RN Previous Signature: 10/16/2021 11:40:05 AM Version By: Geralyn Corwin DO Entered By: Fonnie Mu on 10/16/2021 11:46:12 -------------------------------------------------------------------------------- Problem List Details Patient Name: Date of Service: Alex Jackson, Alex BERT W. 10/16/2021 9:15 A M Medical Record Number: [id number] Patient Account Number: [id number] Date of Birth/Sex: Treating RN: 1945/05/22 (76 y.o. Alex Jackson, Alex Jackson Primary Care Provider: [name] Other Clinician: Referring Provider: Treating Provider/Extender: Zena Amos in Treatment: 20 Active Problems ICD-10 Encounter Code Description Active Date MDM Diagnosis [phone] Non-pressure chronic ulcer of other part of left foot with fat  layer exposed 05/26/2021 No Yes E11.621 Type 2 diabetes mellitus with foot ulcer 05/26/2021 No Yes Z95.5 Presence of coronary angioplasty implant and graft 05/26/2021 No Yes E78.5 Hyperlipidemia, unspecified 05/26/2021 No Yes M54.50 Low back pain, unspecified 05/26/2021 No Yes Inactive Problems Resolved Problems Electronic Signature(s) Signed: 10/16/2021 11:40:05 AM By: Geralyn Corwin DO Entered By: Geralyn Corwin on 10/16/2021 11:10:36 -------------------------------------------------------------------------------- Progress Note Details Patient Name: Date of Service: Alex Jackson, Alex BERT W. 10/16/2021 9:15 A M Medical Record Number: [id number] Patient Account Number: [id number] Date of Birth/Sex: Treating RN: 19-Jun-1945 (76 y.o. Alex Jackson, Alex Jackson  Primary Care Provider: London Pepper Other Clinician: Referring Provider: Treating Provider/Extender: Merilyn Baba in Treatment: 20 Subjective Chief Complaint Information obtained from Patient Left great toe wound History of Present Illness (HPI) Admission 05/26/2021 Mr. Alex Jackson is a 76 year old male with a past medical history of controlled type 2 diabetes on oral agents, chronic back pain and coronary artery disease status post stenting to the LAD that presents to the clinic for a 61-month history of nonhealing wound to the left great toe plantar aspect. He has peripheral neuropathy and is not sure how the wound developed. He has been following with Dr. Doran Durand for this issue and has been using Betadine to the area daily. He does not offload the area. He denies signs of infection. 2/3; patient presents for follow-up. He obtained his left foot x-ray. He has been using gentamicin with hydrofera blue toe to the wound bed with dressing changes. He has no issues or complaints today. He denies signs of infection. 2/17; patient presents for follow-up. He had his MRI that showed bone marrow edema at the distal aspect  concerning for osteomyelitis. Currently patient denies signs of infection. He feels well and has been using gentamicin with Hydrofera Blue to the wound bed. He states that he has ordered custom diabetic shoes. He obtains these at the end of the month. 3/3; patient presents for follow-up. He had a scheduled appointment with Dr. Megan Salon, infectious disease yesterday. Patient canceled and at this time does not want to follow-up with any other doctors for this issue. He currently denies signs of infection. He has been using gentamicin ointment with Hydrofera Blue to the wound bed. He has orthopedic shoes and has an area that offloads the left great toe. 3/17; patient presents for follow-up. He reports stubbing his left great toe that has the wound. He has been using gentamicin ointment and Hydrofera Blue with dressing changes. He has orthopedic shoes that offload the left great toe however he is not wearing them today and he confirms that he does not wear these every day. 3/27; patient presents for follow-up. He has his surgical shoe with a cut out to the left great toe. Unfortunately his foot is shifting in the shoe and pressure continues as noted by the drainage location. He continues to use gentamicin ointment with Hydrofera Blue. He denies signs of infection. He reports less drainage to the wound site. 5/5; patient presents for follow-up. Unfortunately the patient did not follow-up as recommended. I have not seen this patient in over 4 weeks. He has no issues or complaints today. He states he is using gentamicin ointment and Hydrofera Blue however the blue dressing is not staying in place. He denies signs of systemic infection. 5/16; patient presents for follow-up. He obtained his Keystone antibiotics and has been using this daily for the past week. He reports improvement in wound healing. Due to financial constraints he states he would only be able to follow-up every 3 weeks. 6/15; patient  presents for follow-up. He has been using Keystone antibiotic ointment daily. It is unclear if he is able to do this properly since he cannot reach his feet very easily. He has nobody to help him with dressing changes. He had an MRI of his left foot completed on 06/14/2021 that showed changes concerning for osteomyelitis to the left great toe. At that time I had referred him to infectious disease however he did not show up to his appointment and did not want to follow-up at that  time. Today he states he would like to see infectious disease. We set up an appointment for him next Thursday at 930 with Dr. Orvan Falconer. He currently denies signs of infection. He is not able to aggressively offload this area. He is open to doing the total contact cast. Patient History Information obtained from Patient. Family History Cancer - Father,Mother, Hypertension - Mother, No family history of Diabetes, Heart Disease, Hereditary Spherocytosis, Kidney Disease, Lung Disease, Seizures, Stroke, Thyroid Problems, Tuberculosis. Social History Former smoker - quit 1985, Alcohol Use - Daily - wine or scotch, Drug Use - No History, Caffeine Use - Never. Medical History Eyes Patient has history of Cataracts - removed Denies history of Glaucoma, Optic Neuritis Ear/Nose/Mouth/Throat Denies history of Chronic sinus problems/congestion, Middle ear problems Hematologic/Lymphatic Denies history of Hemophilia, Human Immunodeficiency Virus, Lymphedema, Sickle Cell Disease Respiratory Denies history of Aspiration, Asthma, Chronic Obstructive Pulmonary Disease (COPD), Pneumothorax, Sleep Apnea, Tuberculosis Cardiovascular Patient has history of Coronary Artery Disease, Hypertension, Myocardial Infarction - NSTEMI Denies history of Angina, Arrhythmia, Congestive Heart Failure, Deep Vein Thrombosis, Hypotension, Peripheral Arterial Disease, Peripheral Venous Disease, Phlebitis, Vasculitis Gastrointestinal Denies history of  Cirrhosis , Colitis, Crohnoos, Hepatitis A, Hepatitis B, Hepatitis C Endocrine Patient has history of Type II Diabetes - PCP notes type II; per patient takes metformin been told prediabetic Denies history of Type I Diabetes Genitourinary Denies history of End Stage Renal Disease Immunological Denies history of Lupus Erythematosus, Raynaudoos, Scleroderma Integumentary (Skin) Denies history of History of Burn Musculoskeletal Denies history of Gout, Rheumatoid Arthritis, Osteoarthritis, Osteomyelitis Neurologic Denies history of Dementia, Neuropathy, Quadriplegia, Paraplegia, Seizure Disorder Oncologic Denies history of Received Chemotherapy, Received Radiation Psychiatric Denies history of Anorexia/bulimia, Confinement Anxiety Hospitalization/Surgery History - stents MRI 2017and2019. - torn meniscus left 5 weeks ago. Medical A Surgical History Notes nd Constitutional Symptoms (General Health) Back pain x6 months Cardiovascular hyperlipidemia Genitourinary Elevated PSA Objective Constitutional respirations regular, non-labored and within target range for patient.. Vitals Time Taken: 9:13 AM, Height: 71 in, Weight: 252 lbs, BMI: 35.1, Temperature: 98.6 F, Pulse: 68 bpm, Respiratory Rate: 17 breaths/min, Blood Pressure: 146/88 mmHg. Cardiovascular 2+ dorsalis pedis/posterior tibialis pulses. Psychiatric pleasant and cooperative. General Notes: Left great toe: T the plantar aspect there is an open wound with granulation tissue, nonviable tissue and circumferential callus. No signs of o surrounding infection. Integumentary (Hair, Skin) Wound #1 status is Open. Original cause of wound was Blister. The date acquired was: 10/02/2020. The wound has been in treatment 20 weeks. The wound is located on the Left T Great. The wound measures 1.5cm length x 1.6cm width x 0.5cm depth; 1.885cm^2 area and 0.942cm^3 volume. There is Fat Layer oe (Subcutaneous Tissue) exposed. There is no  tunneling noted, however, there is undermining starting at 12:00 and ending at 12:00. There is a medium amount of serosanguineous drainage noted. The wound margin is well defined and not attached to the wound base. There is large (67-100%) red granulation within the wound bed. There is a small (1-33%) amount of necrotic tissue within the wound bed. Assessment Active Problems ICD-10 Non-pressure chronic ulcer of other part of left foot with fat layer exposed Type 2 diabetes mellitus with foot ulcer Presence of coronary angioplasty implant and graft Hyperlipidemia, unspecified Low back pain, unspecified Patient's wound is much larger today although the depth is not significantly different. I debrided nonviable tissue. No surrounding signs of infection. Ideally I would put him in a current total contact cast however I would first like for him to be evaluated  by infectious disease for findings concerning for osteomyelitis seen on imaging. We will refer back to ID. In the meantime he can continue Keystone antibiotics and aggressive offloading. Procedures Wound #1 Pre-procedure diagnosis of Wound #1 is a Diabetic Wound/Ulcer of the Lower Extremity located on the Left T Great .Severity of Tissue Pre Debridement is: oe Fat layer exposed. There was a Excisional Skin/Subcutaneous Tissue Debridement with a total area of 0.28 sq cm performed by Kalman Shan, DO. With the following instrument(s): Curette to remove Viable and Non-Viable tissue/material. Material removed includes Subcutaneous Tissue and Slough and after achieving pain control using Lidocaine. No specimens were taken. A time out was conducted at 09:50, prior to the start of the procedure. A Minimum amount of bleeding was controlled with Pressure. The procedure was tolerated well with a pain level of 0 throughout and a pain level of 0 following the procedure. Post Debridement Measurements: 1.5cm length x 1.6cm width x 0.5cm depth; 0.942cm^3  volume. Character of Wound/Ulcer Post Debridement is improved. Severity of Tissue Post Debridement is: Fat layer exposed. Post procedure Diagnosis Wound #1: Same as Pre-Procedure Plan Follow-up Appointments: Return Appointment in 1 week. - on Friday 10/24/21 @ 0915 w/ Dr. Martie Round and Allayne Butcher Room # 9 ***New pt. appt. w/ Dr. Megan Salon Thursday 10/23/21 @ 0930**** Bathing/ Shower/ Hygiene: May shower and wash wound with soap and water. - with dressing changes. Edema Control - Lymphedema / SCD / Other: Elevate legs to the level of the heart or above for 30 minutes daily and/or when sitting, a frequency of: - 3-4 times a day throughout the day. Avoid standing for long periods of time. Moisturize legs daily. - every night before bed. Off-Loading: Other: - Continue to wear Surgical Shoe with offloading felt Consults ordered were: Infectious Disease - Refer again to Infectious Disease for +osteo to left great toe with non-healing ulcer. WOUND #1: - T Great Wound Laterality: Left oe Cleanser: Soap and Water 1 x Per Day/30 Days Discharge Instructions: May shower and wash wound with dial antibacterial soap and water prior to dressing change. Topical: keystone 1 x Per Day/30 Days Secondary Dressing: Optifoam Non-Adhesive Dressing, 4x4 in 1 x Per Day/30 Days Discharge Instructions: Cut to make foam donut Secondary Dressing: Woven Gauze Sponges 2x2 in 1 x Per Day/30 Days Discharge Instructions: Apply over primary dressing and under the toe rolled gauze as directed. Secured With: Child psychotherapist, Sterile 2x75 (in/in) 1 x Per Day/30 Days Discharge Instructions: Secure with stretch gauze as directed. Secured With: 69M Medipore H Soft Cloth Surgical T ape, 4 x 10 (in/yd) 1 x Per Day/30 Days Discharge Instructions: Secure with tape as directed. 1. In office sharp debridement 2. Keystone antibiotic 3. Aggressive offloading with surgical shoe and offloading felt pad 4. Referral to  infectious disease 5. Follow-up in 1 week Electronic Signature(s) Signed: 10/16/2021 11:40:05 AM By: Kalman Shan DO Entered By: Kalman Shan on 10/16/2021 11:39:24 -------------------------------------------------------------------------------- HxROS Details Patient Name: Date of Service: Alex Jackson, Alex BERT W. 10/16/2021 9:15 A M Medical Record Number: [id number] Patient Account Number: [id number] Date of Birth/Sex: Treating RN: 01/25/1946 (76 y.o. Alex Jackson, Alex Jackson Primary Care Provider: London Pepper Other Clinician: Referring Provider: Treating Provider/Extender: Merilyn Baba in Treatment: 80 Information Obtained From Patient Constitutional Symptoms (General Health) Medical History: Past Medical History Notes: Back pain x6 months Eyes Medical History: Positive for: Cataracts - removed Negative for: Glaucoma; Optic Neuritis Ear/Nose/Mouth/Throat Medical History: Negative for: Chronic sinus problems/congestion; Middle ear problems  Hematologic/Lymphatic Medical History: Negative for: Hemophilia; Human Immunodeficiency Virus; Lymphedema; Sickle Cell Disease Respiratory Medical History: Negative for: Aspiration; Asthma; Chronic Obstructive Pulmonary Disease (COPD); Pneumothorax; Sleep Apnea; Tuberculosis Cardiovascular Medical History: Positive for: Coronary Artery Disease; Hypertension; Myocardial Infarction - NSTEMI Negative for: Angina; Arrhythmia; Congestive Heart Failure; Deep Vein Thrombosis; Hypotension; Peripheral Arterial Disease; Peripheral Venous Disease; Phlebitis; Vasculitis Past Medical History Notes: hyperlipidemia Gastrointestinal Medical History: Negative for: Cirrhosis ; Colitis; Crohns; Hepatitis A; Hepatitis B; Hepatitis C Endocrine Medical History: Positive for: Type II Diabetes - PCP notes type II; per patient takes metformin been told prediabetic Negative for: Type I Diabetes Time with diabetes: prediabetic 3-4  years per pt Treated with: Oral agents, Diet Blood sugar tested every day: No Genitourinary Medical History: Negative for: End Stage Renal Disease Past Medical History Notes: Elevated PSA Immunological Medical History: Negative for: Lupus Erythematosus; Raynauds; Scleroderma Integumentary (Skin) Medical History: Negative for: History of Burn Musculoskeletal Medical History: Negative for: Gout; Rheumatoid Arthritis; Osteoarthritis; Osteomyelitis Neurologic Medical History: Negative for: Dementia; Neuropathy; Quadriplegia; Paraplegia; Seizure Disorder Oncologic Medical History: Negative for: Received Chemotherapy; Received Radiation Psychiatric Medical History: Negative for: Anorexia/bulimia; Confinement Anxiety HBO Extended History Items Eyes: Cataracts Immunizations Pneumococcal Vaccine: Received Pneumococcal Vaccination: Yes Received Pneumococcal Vaccination On or After 60th Birthday: Yes Implantable Devices No devices added Hospitalization / Surgery History Type of Hospitalization/Surgery stents MRI 2017and2019 torn meniscus left 5 weeks ago Family and Social History Cancer: Yes - Father,Mother; Diabetes: No; Heart Disease: No; Hereditary Spherocytosis: No; Hypertension: Yes - Mother; Kidney Disease: No; Lung Disease: No; Seizures: No; Stroke: No; Thyroid Problems: No; Tuberculosis: No; Former smoker - quit 1985; Alcohol Use: Daily - wine or scotch; Drug Use: No History; Caffeine Use: Never; Financial Concerns: No; Food, Clothing or Shelter Needs: No; Support System Lacking: No; Transportation Concerns: No Electronic Signature(s) Signed: 10/16/2021 11:40:05 AM By: Kalman Shan DO Signed: 10/16/2021 5:29:07 PM By: Rhae Hammock RN Entered By: Kalman Shan on 10/16/2021 11:33:10 -------------------------------------------------------------------------------- SuperBill Details Patient Name: Date of Service: Alex Jackson, Alex BERT W. 10/16/2021 Medical Record  Number: [id number] Patient Account Number: [id number] Date of Birth/Sex: Treating RN: Sep 19, 1945 (76 y.o. Alex Jackson, Alex Jackson Primary Care Provider: London Pepper Other Clinician: Referring Provider: Treating Provider/Extender: Merilyn Baba in Treatment: 20 Diagnosis Coding ICD-10 Codes Code Description [id number] Non-pressure chronic ulcer of other part of left foot with fat layer exposed E11.621 Type 2 diabetes mellitus with foot ulcer Z95.5 Presence of coronary angioplasty implant and graft E78.5 Hyperlipidemia, unspecified M54.50 Low back pain, unspecified Facility Procedures CPT4 Code: [id number] Description: [id number] - DEB SUBQ TISSUE 20 SQ CM/< ICD-10 Diagnosis Description L97.522 Non-pressure chronic ulcer of other part of left foot with fat layer exposed Modifier: Quantity: 1 Physician Procedures : CPT4 Code Description Modifier [id number] - WC PHYS SUBQ TISS 20 SQ CM ICD-10 Diagnosis Description L97.522 Non-pressure chronic ulcer of other part of left foot with fat layer exposed Quantity: 1 Electronic Signature(s) Signed: 10/16/2021 11:40:05 AM By: Kalman Shan DO Entered By: Kalman Shan on 10/16/2021 11:39:43

## 2021-10-16 NOTE — Progress Notes (Signed)
Alex Jackson ([id number]) Visit Report for 10/16/2021 Arrival Information Details Patient Name: Date of Service: Alex Jackson. 10/16/2021 9:15 A M Medical Record Number: [id number] Patient Account Number: [id number] Date of Birth/Sex: Treating RN: [birth year] (76 y.o. Alex Jackson, [name] Primary Care Enslee Bibbins: London Pepper Other Clinician: Referring Bao Bazen: Treating Anetra Czerwinski/Extender: Merilyn Baba in Treatment: 36 Visit Information History Since Last Visit Added or deleted any medications: No Patient Arrived: [name] Any new allergies or adverse reactions: No Arrival Time: 09:13 Had a fall or experienced change in No Accompanied By: self activities of daily living that may affect Transfer Assistance: None risk of falls: Patient Identification Verified: Yes Signs or symptoms of abuse/neglect since last visito No Secondary Verification Process Completed: Yes Hospitalized since last visit: No Patient Requires Transmission-Based Precautions: No Implantable device outside of the clinic excluding No Patient Has Alerts: Yes cellular tissue based products placed in the center Patient Alerts: 11/18/20 ABI L1.04 since last visit: 11/18/20 TBI L 1.35 Has Dressing in Place as Prescribed: Yes Pain Present Now: Yes Electronic Signature(s) Signed: 10/16/2021 5:29:07 PM By: Rhae Hammock RN Entered By: Rhae Hammock on 10/16/2021 09:13:43 -------------------------------------------------------------------------------- Encounter Discharge Information Details Patient Name: Date of Service: Leeroy Jackson, [name] 10/16/2021 9:15 A M Medical Record Number: [id number] Patient Account Number: [id number] Date of Birth/Sex: Treating RN: 26-Aug-1945 (76 y.o. Alex Jackson, [name] Primary Care Myrical Andujo: London Pepper Other Clinician: Referring Voyd Groft: Treating Lejuan Botto/Extender: Merilyn Baba in Treatment: 20 Encounter Discharge  Information Items Post Procedure Vitals Discharge Condition: Stable Temperature (F): 98.7 Ambulatory Status: Ambulatory Pulse (bpm): 74 Discharge Destination: Home Respiratory Rate (breaths/min): 17 Transportation: Private Auto Blood Pressure (mmHg): 134/74 Accompanied By: self Schedule Follow-up Appointment: Yes Clinical Summary of Care: Patient Declined Electronic Signature(s) Signed: 10/16/2021 5:29:07 PM By: Rhae Hammock RN Entered By: Rhae Hammock on 10/16/2021 10:15:49 -------------------------------------------------------------------------------- Lower Extremity Assessment Details Patient Name: Date of Service: Leeroy Jackson, [name] 10/16/2021 9:15 A M Medical Record Number: [id number] Patient Account Number: [id number] Date of Birth/Sex: Treating RN: [birth year] (76 y.o. Alex Jackson, [name] Primary Care [name]: London Pepper Other Clinician: Referring Lorenso Quirino: Treating Amiah Frohlich/Extender: Merilyn Baba in Treatment: 20 Edema Assessment Assessed: Shirlyn Goltz: Yes] Patrice Paradise: No] Edema: [Left: N] [Right: o] Calf Left: Right: Point of Measurement: 36 cm From Medial Instep 38 cm Ankle Left: Right: Point of Measurement: 12 cm From Medial Instep 24 cm Vascular Assessment Pulses: Dorsalis Pedis Palpable: [Left:Yes] Posterior Tibial Palpable: [Left:Yes] Electronic Signature(s) Signed: 10/16/2021 5:29:07 PM By: Rhae Hammock RN Entered By: Rhae Hammock on 10/16/2021 09:35:23 -------------------------------------------------------------------------------- Multi Wound Chart Details Patient Name: Date of Service: Leeroy Jackson, [name] 10/16/2021 9:15 A M Medical Record Number: [id number] Patient Account Number: [id number] Date of Birth/Sex: Treating RN: Sep 15, 1945 (76 y.o. Alex Jackson, [name] Primary Care Deisy Ozbun: London Pepper Other Clinician: Referring Sander Speckman: Treating Cleopha Indelicato/Extender: Merilyn Baba  in Treatment: 20 Vital Signs Height(in): 71 Pulse(bpm): 18 Weight(lbs): 252 Blood Pressure(mmHg): 146/88 Body Mass Index(BMI): 35.1 Temperature(F): 98.6 Respiratory Rate(breaths/min): 17 Photos: [1:Left T Great oe] [N/A:N/A N/A] Wound Location: [1:Blister] [N/A:N/A] Wounding Event: [1:Diabetic Wound/Ulcer of the Lower] [N/A:N/A] Primary Etiology: [1:Extremity Cataracts, Coronary Artery Disease, N/A] Comorbid History: [1:Hypertension, Myocardial Infarction, Type II Diabetes 10/02/2020] [N/A:N/A] Date Acquired: [1:20] [N/A:N/A] Weeks of Treatment: [1:Open] [N/A:N/A] Wound Status: [1:No] [N/A:N/A] Wound Recurrence: [1:1.5x1.6x0.5] [N/A:N/A] Measurements L x W x D (cm) [1:1.885] [N/A:N/A] A (cm) : rea [1:0.942] [N/A:N/A] Volume (cm) : [1:-566.10%] [N/A:N/A] %  Reduction in A [1:rea: -733.60%] [N/A:N/A] % Reduction in Volume: [1:12] Starting Position 1 (o'clock): [1:12] Ending Position 1 (o'clock): [1:Yes] [N/A:N/A] Undermining: [1:Grade 3] [N/A:N/A] Classification: [1:Medium] [N/A:N/A] Exudate A mount: [1:Serosanguineous] [N/A:N/A] Exudate Type: [1:red, brown] [N/A:N/A] Exudate Color: [1:Well defined, not attached] [N/A:N/A] Wound Margin: [1:Large (67-100%)] [N/A:N/A] Granulation A mount: [1:Red] [N/A:N/A] Granulation Quality: [1:Small (1-33%)] [N/A:N/A] Necrotic A mount: [1:Fat Layer (Subcutaneous Tissue): Yes N/A] Exposed Structures: [1:Fascia: No Tendon: No Muscle: No Joint: No Bone: No None] [N/A:N/A] Epithelialization: [1:Debridement - Excisional] [N/A:N/A] Debridement: Pre-procedure Verification/Time Out 09:50 [N/A:N/A] Taken: [1:Lidocaine] [N/A:N/A] Pain Control: [1:Subcutaneous, Slough] [N/A:N/A] Tissue Debrided: [1:Skin/Subcutaneous Tissue] [N/A:N/A] Level: [1:0.28] [N/A:N/A] Debridement A (sq cm): [1:rea Curette] [N/A:N/A] Instrument: [1:Minimum] [N/A:N/A] Bleeding: [1:Pressure] [N/A:N/A] Hemostasis A chieved: [1:0] [N/A:N/A] Procedural Pain: [1:0]  [N/A:N/A] Post Procedural Pain: [1:Procedure was tolerated well] [N/A:N/A] Debridement Treatment Response: [1:1.5x1.6x0.5] [N/A:N/A] Post Debridement Measurements L x W x D (cm) [1:0.942] [N/A:N/A] Post Debridement Volume: (cm) [1:Debridement] [N/A:N/A] Treatment Notes Wound #1 (Toe Great) Wound Laterality: Left Cleanser Soap and Water Discharge Instruction: May shower and wash wound with dial antibacterial soap and water prior to dressing change. Peri-Wound Care Topical keystone Primary Dressing Secondary Dressing Optifoam Non-Adhesive Dressing, 4x4 in Discharge Instruction: Cut to make foam donut Woven Gauze Sponges 2x2 in Discharge Instruction: Apply over primary dressing and under the toe rolled gauze as directed. Secured With Conforming Stretch Gauze Bandage, Sterile 2x75 (in/in) Discharge Instruction: Secure with stretch gauze as directed. 72M Medipore H Soft Cloth Surgical T ape, 4 x 10 (in/yd) Discharge Instruction: Secure with tape as directed. Compression Wrap Compression Stockings Add-Ons Electronic Signature(s) Signed: 10/16/2021 11:40:05 AM By: Kalman Shan DO Signed: 10/16/2021 5:29:07 PM By: Rhae Hammock RN Entered By: Kalman Shan on 10/16/2021 11:29:28 -------------------------------------------------------------------------------- Multi-Disciplinary Care Plan Details Patient Name: Date of Service: Leeroy Jackson, [name] 10/16/2021 9:15 A M Medical Record Number: [id number] Patient Account Number: [id number] Date of Birth/Sex: Treating RN: 09-20-1945 (76 y.o. Alex Jackson, [name] Primary Care Addisyn Leclaire: London Pepper Other Clinician: Referring Dionicio Shelnutt: Treating Rolly Magri/Extender: Merilyn Baba in Treatment: 20 Multidisciplinary Care Plan reviewed with physician Active Inactive Nutrition Nursing Diagnoses: Potential for alteratiion in Nutrition/Potential for imbalanced nutrition Goals: Patient/caregiver agrees to and  verbalizes understanding of need to obtain nutritional consultation Date Initiated: 05/26/2021 Date Inactivated: 06/06/2021 Target Resolution Date: 06/06/2021 Goal Status: Met Patient/caregiver will maintain therapeutic glucose control Date Initiated: 06/06/2021 Target Resolution Date: 11/01/2021 Goal Status: Active Interventions: Assess patient nutrition upon admission and as needed per policy Provide education on nutrition Treatment Activities: Education provided on Nutrition : 07/04/2021 Obtain HgA1c : 05/26/2021 Patient referred to Primary Care Physician for further nutritional evaluation : 05/26/2021 Notes: Wound/Skin Impairment Nursing Diagnoses: Knowledge deficit related to ulceration/compromised skin integrity Goals: Patient/caregiver will verbalize understanding of skin care regimen Date Initiated: 05/26/2021 Target Resolution Date: 11/01/2021 Goal Status: Active Ulcer/skin breakdown will have a volume reduction of 50% by week 8 Date Initiated: 06/06/2021 Target Resolution Date: 11/01/2021 Goal Status: Active Interventions: Assess patient/caregiver ability to perform ulcer/skin care regimen upon admission and as needed Assess ulceration(s) every visit Provide education on ulcer and skin care Treatment Activities: Skin care regimen initiated : 05/26/2021 Topical wound management initiated : 05/26/2021 Notes: Electronic Signature(s) Signed: 10/16/2021 5:29:07 PM By: Rhae Hammock RN Entered By: Rhae Hammock on 10/16/2021 09:58:59 -------------------------------------------------------------------------------- Pain Assessment Details Patient Name: Date of Service: Leeroy Jackson, [name] 10/16/2021 9:15 A M Medical Record Number: [id number] Patient Account Number: [id number] Date of Birth/Sex: Treating RN:  March 14, 1946 (76 y.o. Alex Jackson, [name] Primary Care Stepahnie Campo: London Pepper Other Clinician: Referring Virginia Curl: Treating Timarion Agcaoili/Extender: Merilyn Baba in Treatment: 20 Active Problems Location of Pain Severity and Description of Pain Patient Has Paino Yes Site Locations Pain Location: Generalized Pain, Pain in Ulcers With Dressing Change: Yes Duration of the Pain. Constant / Intermittento Intermittent Rate the pain. Current Pain Level: 7 Worst Pain Level: 10 Least Pain Level: 0 Tolerable Pain Level: 7 Character of Pain Describe the Pain: Aching Pain Management and Medication Current Pain Management: Medication: No Cold Application: No Rest: No Massage: No Activity: No T.E.N.S.: No Heat Application: No Leg drop or elevation: No Is the Current Pain Management Adequate: Adequate How does your wound impact your activities of daily livingo Sleep: No Bathing: No Appetite: No Relationship With Others: No Bladder Continence: No Emotions: No Bowel Continence: No Work: No Toileting: No Drive: No Dressing: No Hobbies: No Electronic Signature(s) Signed: 10/16/2021 5:29:07 PM By: Rhae Hammock RN Entered By: Rhae Hammock on 10/16/2021 09:14:31 -------------------------------------------------------------------------------- Patient/Caregiver Education Details Patient Name: Date of Service: Alex Jackson 6/15/2023andnbsp9:15 [place] Record Number: [id number] Patient Account Number: [id number] Date of Birth/Gender: Treating RN: 1946/04/12 (76 y.o. [name] Primary Care Physician: London Pepper Other Clinician: Referring Physician: Treating Physician/Extender: Merilyn Baba in Treatment: 20 Education Assessment Education Provided To: Patient Education Topics Provided Nutrition: Methods: Explain/Verbal Responses: Reinforcements needed, State content correctly Wound/Skin Impairment: Methods: Explain/Verbal Responses: Reinforcements needed, State content correctly Electronic Signature(s) Signed: 10/16/2021 5:29:07 PM By: Rhae Hammock RN Entered  By: Rhae Hammock on 10/16/2021 09:59:14 -------------------------------------------------------------------------------- Wound Assessment Details Patient Name: Date of Service: Leeroy Jackson, [name] 10/16/2021 9:15 A M Medical Record Number: [id number] Patient Account Number: [id number] Date of Birth/Sex: Treating RN: 26-Sep-1945 (76 y.o. Alex Jackson, [name] Primary Care Peyton Rossner: London Pepper Other Clinician: Referring Kilynn Fitzsimmons: Treating Malacki Mcphearson/Extender: Merilyn Baba in Treatment: 20 Wound Status Wound Number: 1 Primary Diabetic Wound/Ulcer of the Lower Extremity Etiology: Wound Location: Left T Great oe Wound Open Wounding Event: Blister Status: Date Acquired: 10/02/2020 Comorbid Cataracts, Coronary Artery Disease, Hypertension, Myocardial Weeks Of Treatment: 20 History: Infarction, Type II Diabetes Clustered Wound: No Photos Wound Measurements Length: (cm) 1.5 Width: (cm) 1.6 Depth: (cm) 0.5 Area: (cm) 1.885 Volume: (cm) 0.942 % Reduction in Area: -566.1% % Reduction in Volume: -733.6% Epithelialization: None Tunneling: No Undermining: Yes Starting Position (o'clock): 12 Ending Position (o'clock): 12 Wound Description Classification: Grade 3 Wound Margin: Well defined, not attached Exudate Amount: Medium Exudate Type: Serosanguineous Exudate Color: red, brown Foul Odor After Cleansing: No Slough/Fibrino Yes Wound Bed Granulation Amount: Large (67-100%) Exposed Structure Granulation Quality: Red Fascia Exposed: No Necrotic Amount: Small (1-33%) Fat Layer (Subcutaneous Tissue) Exposed: Yes Tendon Exposed: No Muscle Exposed: No Joint Exposed: No Bone Exposed: No Treatment Notes Wound #1 (Toe Great) Wound Laterality: Left Cleanser Soap and Water Discharge Instruction: May shower and wash wound with dial antibacterial soap and water prior to dressing change. Peri-Wound Care Topical keystone Primary Dressing Secondary  Dressing Optifoam Non-Adhesive Dressing, 4x4 in Discharge Instruction: Cut to make foam donut Woven Gauze Sponges 2x2 in Discharge Instruction: Apply over primary dressing and under the toe rolled gauze as directed. Secured With Conforming Stretch Gauze Bandage, Sterile 2x75 (in/in) Discharge Instruction: Secure with stretch gauze as directed. 40M Medipore H Soft Cloth Surgical T ape, 4 x 10 (in/yd) Discharge Instruction: Secure with tape as directed. Compression Wrap Compression Stockings Add-Ons  Electronic Signature(s) Signed: 10/16/2021 5:29:07 PM By: Rhae Hammock RN Entered By: Rhae Hammock on 10/16/2021 09:57:33 -------------------------------------------------------------------------------- Vitals Details Patient Name: Date of Service: Leeroy Jackson, [name] 10/16/2021 9:15 A M Medical Record Number: [id number] Patient Account Number: [id number] Date of Birth/Sex: Treating RN: 08-01-1945 (76 y.o. Alex Jackson, [name] Primary Care Isidro Monks: London Pepper Other Clinician: Referring Belkis Norbeck: Treating Ibrohim Simmers/Extender: Merilyn Baba in Treatment: 20 Vital Signs Time Taken: 09:13 Temperature (F): 98.6 Height (in): 71 Pulse (bpm): 68 Weight (lbs): 252 Respiratory Rate (breaths/min): 17 Body Mass Index (BMI): 35.1 Blood Pressure (mmHg): 146/88 Reference Range: 80 - 120 mg / dl Electronic Signature(s) Signed: 10/16/2021 5:29:07 PM By: Rhae Hammock RN Entered By: Rhae Hammock on 10/16/2021 09:14:02

## 2021-10-17 ENCOUNTER — Other Ambulatory Visit: Payer: Self-pay | Admitting: Cardiovascular Disease

## 2021-10-17 ENCOUNTER — Encounter (HOSPITAL_BASED_OUTPATIENT_CLINIC_OR_DEPARTMENT_OTHER): Payer: PPO | Admitting: Internal Medicine

## 2021-10-17 NOTE — Telephone Encounter (Signed)
Refill Request.  

## 2021-10-20 DIAGNOSIS — S91102A Unspecified open wound of left great toe without damage to nail, initial encounter: Secondary | ICD-10-CM | POA: Diagnosis not present

## 2021-10-21 DIAGNOSIS — M25651 Stiffness of right hip, not elsewhere classified: Secondary | ICD-10-CM | POA: Diagnosis not present

## 2021-10-21 DIAGNOSIS — M25652 Stiffness of left hip, not elsewhere classified: Secondary | ICD-10-CM | POA: Diagnosis not present

## 2021-10-21 DIAGNOSIS — M5117 Intervertebral disc disorders with radiculopathy, lumbosacral region: Secondary | ICD-10-CM | POA: Diagnosis not present

## 2021-10-21 DIAGNOSIS — R262 Difficulty in walking, not elsewhere classified: Secondary | ICD-10-CM | POA: Diagnosis not present

## 2021-10-22 DIAGNOSIS — M869 Osteomyelitis, unspecified: Secondary | ICD-10-CM | POA: Insufficient documentation

## 2021-10-22 DIAGNOSIS — L089 Local infection of the skin and subcutaneous tissue, unspecified: Secondary | ICD-10-CM | POA: Insufficient documentation

## 2021-10-23 ENCOUNTER — Other Ambulatory Visit: Payer: Self-pay

## 2021-10-23 ENCOUNTER — Encounter: Payer: Self-pay | Admitting: Internal Medicine

## 2021-10-23 ENCOUNTER — Ambulatory Visit (INDEPENDENT_AMBULATORY_CARE_PROVIDER_SITE_OTHER): Payer: PPO | Admitting: Internal Medicine

## 2021-10-23 DIAGNOSIS — L089 Local infection of the skin and subcutaneous tissue, unspecified: Secondary | ICD-10-CM | POA: Diagnosis not present

## 2021-10-23 DIAGNOSIS — M86672 Other chronic osteomyelitis, left ankle and foot: Secondary | ICD-10-CM | POA: Diagnosis not present

## 2021-10-23 DIAGNOSIS — E11628 Type 2 diabetes mellitus with other skin complications: Secondary | ICD-10-CM | POA: Diagnosis not present

## 2021-10-23 DIAGNOSIS — M25651 Stiffness of right hip, not elsewhere classified: Secondary | ICD-10-CM | POA: Diagnosis not present

## 2021-10-23 DIAGNOSIS — R262 Difficulty in walking, not elsewhere classified: Secondary | ICD-10-CM | POA: Diagnosis not present

## 2021-10-23 DIAGNOSIS — M25652 Stiffness of left hip, not elsewhere classified: Secondary | ICD-10-CM | POA: Diagnosis not present

## 2021-10-23 DIAGNOSIS — M5117 Intervertebral disc disorders with radiculopathy, lumbosacral region: Secondary | ICD-10-CM | POA: Diagnosis not present

## 2021-10-23 MED ORDER — AMOXICILLIN-POT CLAVULANATE 875-125 MG PO TABS: 1.0000 | ORAL_TABLET | Freq: Two times a day (BID) | ORAL | 1 refills | Status: DC

## 2021-10-23 MED ORDER — SULFAMETHOXAZOLE-TRIMETHOPRIM 800-160 MG PO TABS: 1.0000 | ORAL_TABLET | Freq: Two times a day (BID) | ORAL | 1 refills | Status: DC

## 2021-10-23 NOTE — Progress Notes (Addendum)
      [facility]  Reason for Consult: Diabetic foot infection with left foot osteomyelitis Referring Provider: Dr. Jessica Hoffman  Assessment: Although Bob's chronic plantar ulcer does not appear to be infected, his recent MRI suggests that he has developed osteomyelitis of the distal tuft of his left great toe.  Untreated this is likely to progress.  I reviewed treatment options including empiric oral antibiotics versus PICC placement and IV antibiotic therapy.  After discussion we agreed on starting oral trimethoprim/sulfamethoxazole and amoxicillin/clavulanate.  This will cover the organisms grown in January as well as the other, mixed aerobic and anaerobic flora that are expected.  I will check his inflammatory markers and renal function today and see him back in 4 weeks.   Plan: CBC, BMP, ESR and CRP today Start trimethoprim/sulfamethoxazole and amoxicillin/clavulanate Follow-up in 4 weeks  Patient Active Problem List   Diagnosis Date Noted   Diabetic infection of left foot (HCC) 10/22/2021    Priority: High   Osteomyelitis of left foot (HCC) 10/22/2021    Priority: High   Piriformis syndrome of left side 07/23/2017   Coronary artery disease involving native coronary artery of native heart with unstable angina pectoris (HCC) 02/11/2017   Lumbar spondylosis 02/11/2017   Iliotibial band syndrome of both sides 02/11/2017   H/O hyperglycemia 02/11/2017   Chronic lower back pain 02/11/2017   Cardiomyopathy, ischemic 09/16/2015   SOB (shortness of breath) 09/16/2015   LV dysfunction 09/16/2015   Hyperlipidemia 03/02/2015   Essential hypertension 03/02/2015   S/P drug eluting coronary stent placement 03/02/2015   NSTEMI (non-ST elevated myocardial infarction) (HCC) 03/01/2015    Patient's Medications  New Prescriptions   No medications on file  Previous Medications   ACETAMINOPHEN (TYLENOL) 325 MG TABLET    Take 2 tablets (650 mg total) by mouth  every 4 (four) hours as needed for headache or mild pain.   ASPIRIN EC 81 MG TABLET    Take 81 mg by mouth daily.   BISOPROLOL (ZEBETA) 10 MG TABLET    TAKE ONE TABLET BY MOUTH EVERY MORNING   CHLORTHALIDONE (HYGROTON) 25 MG TABLET    TAKE ONE TABLET BY MOUTH ONCE DAILY   CYCLOBENZAPRINE (FLEXERIL) 10 MG TABLET    Take 1 tablet (10 mg total) by mouth 2 (two) times daily as needed for up to 20 doses for muscle spasms.   FLUTICASONE (FLONASE) 50 MCG/ACT NASAL SPRAY    USE 1 SPRAY IN EACH NOSTRIL EVERY DAY   FUROSEMIDE (LASIX) 40 MG TABLET    Take 1 tablet (40 mg total) by mouth daily.   HYDROXYPROPYL METHYLCELLULOSE / HYPROMELLOSE (ISOPTO TEARS / GONIOVISC) 2.5 % OPHTHALMIC SOLUTION    Place 1 drop into both eyes as needed for dry eyes.   LISINOPRIL (ZESTRIL) 40 MG TABLET    TAKE ONE TABLET BY MOUTH ONCE DAILY   LOTEPREDNOL (LOTEMAX) 0.5 % OPHTHALMIC SUSPENSION       METFORMIN (GLUCOPHAGE) 500 MG TABLET    Take 1 tablet (500 mg total) by mouth 2 (two) times daily.   MULTIPLE VITAMIN (MULTIVITAMIN WITH MINERALS) TABS TABLET    Take 1 tablet by mouth daily.   NITROGLYCERIN (NITROSTAT) 0.4 MG SL TABLET    Place 1 tablet (0.4 mg total) under the tongue every 5 (five) minutes x 3 doses as needed for chest pain.   OMEGA-3 ACID ETHYL ESTERS (LOVAZA) 1 G CAPSULE    TAKE ONE CAPSULE BY MOUTH EVERY MORNING and TAKE   ONE CAPSULE BY MOUTH EVERYDAY AT BEDTIME   PANTOPRAZOLE (PROTONIX) 40 MG TABLET    TAKE ONE TABLET BY MOUTH ONCE DAILY   PREGABALIN (LYRICA) 75 MG CAPSULE    Take 1 capsule (75 mg total) by mouth 2 (two) times daily.   ROSUVASTATIN (CRESTOR) 10 MG TABLET    Take 1 tablet (10 mg total) by mouth daily.   SEMAGLUTIDE,0.25 OR 0.5MG/DOS, (OZEMPIC, 0.25 OR 0.5 MG/DOSE,) 2 MG/1.5ML SOPN    Inject 0.5 mg into the skin once a week.  Modified Medications   No medications on file  Discontinued Medications   No medications on file    HPI: Alex Jackson is a 76 y.o. male with diabetes and peripheral  neuropathy who developed a blister on the plantar surface of his left great toe on 10/02/2020.  The blister opened and drained and he has had a plantar ulcer ever since.  He was evaluated by orthopedics last year and amputation was recommended.  He did not like that recommendation.  A wound culture in January grew E. coli and Alcaligenes.  Molecular testing of a wound swab specimen on 09/08/2021 detected Enterococcus, coagulase-negative staph, Enterococcus, Staph aureus and Strep agalactiae.  He has not been on any antibiotics.  An MRI in February revealed:  MRI 06/14/2021 IMPRESSION: 1. Soft tissue ulcer at the tip of the great toe. Bone marrow edema in the distal aspect of the first distal phalanx concerning for osteomyelitis. No drainable fluid collection to suggest an abscess. 2. Bipartite medial hallux sesamoid with marrow edema as can be seen with sesamoiditis.  Full contact casting is being planned.  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  He has had problems with left knee arthritis and right thigh pain.  He is seeing a physical therapist twice weekly.  He lives alone.  Review of Systems: Review of Systems  Constitutional:  Negative for chills, diaphoresis and fever.  Musculoskeletal:  Positive for joint pain.      Past Medical History:  Diagnosis Date   CAD in native artery 03/01/2015   1. cath - DES to LAD residual non obstructive RCA disease in 2016 b. Cath s/p Successful PCI with stenting of the distal RCA into the PLOM with a DES. Balloon angioplasty of the PDA through the stent struts.    Diabetes mellitus without complication (HCC)    Edema of both lower extremities    Hyperlipidemia    Hypertension    Leg weakness    Overweight    Spinal stenosis    Ulcer of great toe (HCC)     Social History   Tobacco Use   Smoking status: Former    Packs/day: 1.00    Types: Cigarettes     Quit date: 07/03/1983    Years since quitting: 38.3   Smokeless tobacco: Never   Tobacco comments:    quit in Mar 1985  Vaping Use   Vaping Use: Never used  Substance Use Topics   Alcohol use: Yes    Alcohol/week: 0.0 standard drinks of alcohol    Comment: "one or two drinks a night"   Drug use: No    Family History  Problem Relation Age of Onset   Thyroid cancer Mother    Liver cancer Father        pancreatic   Arrhythmia Sister    Allergies  Allergen Reactions  Prednisone Other (See Comments)    Makes him crazy   Atorvastatin Other (See Comments)    Pt reports "causes lower extremity muscle aches."   Pravastatin Other (See Comments)    Pt reports "causes bilateral calf cramping."    OBJECTIVE: There were no vitals filed for this visit. There is no height or weight on file to calculate BMI.   Physical Exam Constitutional:      Comments: He is in good spirits.  Cardiovascular:     Rate and Rhythm: Normal rate.  Pulmonary:     Effort: Pulmonary effort is normal.  Musculoskeletal:     Comments: He has no unusual swelling, redness or tenderness of his left great toe.  He has a 10 mm ulcer on the plantar surface of that toe.  There is no drainage or odor.  There is no exposed bone.      Microbiology: Left great toe wound culture 05/26/2021 E. coli and Alcaligenes  Michel Bickers, [facility] [phone] pager   [phone] cell 10/23/2021, 9:30 AM

## 2021-10-23 NOTE — Assessment & Plan Note (Signed)
Although his chronic plantar ulcer does not appear to be infected, his recent MRI suggest that he has developed osteomyelitis of the distal tuft of his left great toe.  Untreated this is likely to progress.  I reviewed treatment options including empiric oral antibiotics versus PICC placement and IV antibiotic therapy.  After discussion we agreed on starting oral trimethoprim/sulfamethoxazole and amoxicillin/clavulanate.  This will cover the organisms grown in January as well as the other, mixed aerobic and anaerobic flora that are expected.  I will check his inflammatory markers and renal function today and see him back in 4 weeks.

## 2021-10-24 ENCOUNTER — Encounter (HOSPITAL_BASED_OUTPATIENT_CLINIC_OR_DEPARTMENT_OTHER): Payer: PPO | Admitting: Internal Medicine

## 2021-10-24 DIAGNOSIS — L97522 Non-pressure chronic ulcer of other part of left foot with fat layer exposed: Secondary | ICD-10-CM | POA: Diagnosis not present

## 2021-10-24 LAB — CBC
HCT: 43.6 % (ref 38.5–50.0)
Hemoglobin: 15.3 g/dL (ref 13.2–17.1)
MCH: 32.1 pg (ref 27.0–33.0)
MCHC: 35.1 g/dL (ref 32.0–36.0)
MCV: 91.4 fL (ref 80.0–100.0)
MPV: 9.6 fL (ref 7.5–12.5)
Platelets: 234 10*3/uL (ref 140–400)
RBC: 4.77 10*6/uL (ref 4.20–5.80)
RDW: 11.9 % (ref 11.0–15.0)
WBC: 9.7 10*3/uL (ref 3.8–10.8)

## 2021-10-24 LAB — BASIC METABOLIC PANEL
BUN: 20 mg/dL (ref 7–25)
CO2: 31 mmol/L (ref 20–32)
Calcium: 11 mg/dL — ABNORMAL HIGH (ref 8.6–10.3)
Chloride: 98 mmol/L (ref 98–110)
Creat: 0.96 mg/dL (ref 0.70–1.28)
Glucose, Bld: 120 mg/dL — ABNORMAL HIGH (ref 65–99)
Potassium: 3.9 mmol/L (ref 3.5–5.3)
Sodium: 137 mmol/L (ref 135–146)

## 2021-10-24 LAB — SEDIMENTATION RATE: Sed Rate: 9 mm/h (ref 0–20)

## 2021-10-24 LAB — C-REACTIVE PROTEIN: CRP: 4.2 mg/L (ref <8.0)

## 2021-10-27 ENCOUNTER — Encounter (HOSPITAL_BASED_OUTPATIENT_CLINIC_OR_DEPARTMENT_OTHER): Payer: PPO | Admitting: Internal Medicine

## 2021-10-27 DIAGNOSIS — L97522 Non-pressure chronic ulcer of other part of left foot with fat layer exposed: Secondary | ICD-10-CM | POA: Diagnosis not present

## 2021-10-30 ENCOUNTER — Encounter (HOSPITAL_BASED_OUTPATIENT_CLINIC_OR_DEPARTMENT_OTHER): Payer: PPO | Admitting: Internal Medicine

## 2021-10-30 DIAGNOSIS — L97522 Non-pressure chronic ulcer of other part of left foot with fat layer exposed: Secondary | ICD-10-CM | POA: Diagnosis not present

## 2021-10-30 DIAGNOSIS — E11621 Type 2 diabetes mellitus with foot ulcer: Secondary | ICD-10-CM

## 2021-10-31 ENCOUNTER — Ambulatory Visit (HOSPITAL_BASED_OUTPATIENT_CLINIC_OR_DEPARTMENT_OTHER): Payer: PPO | Admitting: Internal Medicine

## 2021-10-31 NOTE — Progress Notes (Signed)
KYRESE, GARTMAN ([id number]) Visit Report for 10/30/2021 Arrival Information Details Patient Name: Date of Service: Alex Jackson. 10/30/2021 9:15 A M Medical Record Number: [id number] Patient Account Number: [id number] Date of Birth/Sex: Treating RN: March 22, 1946 (76 y.o. [name] Primary Care Elky Funches: London Pepper Other Clinician: Referring Preet Perrier: Treating Leilany Digeronimo/Extender: Merilyn Baba in Treatment: 65 Visit Information History Since Last Visit Added or deleted any medications: No Patient Arrived: Alex Jackson Any new allergies or adverse reactions: No Arrival Time: 09:13 Had a fall or experienced change in No Transfer Assistance: None activities of daily living that may affect Patient Identification Verified: Yes risk of falls: Secondary Verification Process Completed: Yes Signs or symptoms of abuse/neglect since last visito No Patient Requires Transmission-Based Precautions: No Hospitalized since last visit: No Patient Has Alerts: Yes Implantable device outside of the clinic excluding No Patient Alerts: 11/18/20 ABI L1.04 cellular tissue based products placed in the center 11/18/20 TBI L 1.35 since last visit: Has Dressing in Place as Prescribed: Yes Has Footwear/Offloading in Place as Prescribed: Yes Left: T Contact Cast otal Pain Present Now: No Electronic Signature(s) Signed: 10/30/2021 4:43:44 PM By: Lorrin Inoue Entered By: Lorrin Threats on 10/30/2021 09:19:27 -------------------------------------------------------------------------------- Encounter Discharge Information Details Patient Name: Date of Service: Alex Jackson, Alex BERT W. 10/30/2021 9:15 A M Medical Record Number: [id number] Patient Account Number: [id number] Date of Birth/Sex: Treating RN: 03-Dec-1945 (76 y.o. [name] Primary Care Antania Hoefling: London Pepper Other Clinician: Referring Jalen Oberry: Treating Albany Winslow/Extender: Merilyn Baba in Treatment: 57 Encounter Discharge Information Items Discharge Condition: Stable Ambulatory Status: Walker Discharge Destination: Home Transportation: Private Auto Schedule Follow-up Appointment: Yes Clinical Summary of Care: Provided on 10/30/2021 Form Type Recipient Paper Patient Patient Electronic Signature(s) Signed: 10/30/2021 4:43:44 PM By: Lorrin Poncedeleon Entered By: Lorrin Dusek on 10/30/2021 10:19:07 -------------------------------------------------------------------------------- Lower Extremity Assessment Details Patient Name: Date of Service: Alex Berlin W. 10/30/2021 9:15 A M Medical Record Number: [id number] Patient Account Number: [id number] Date of Birth/Sex: Treating RN: May 19, 1945 (76 y.o. [name] Primary Care Mak Bonny: London Pepper Other Clinician: Referring Vinh Sachs: Treating Brealynn Contino/Extender: Merilyn Baba in Treatment: 22 Edema Assessment Assessed: Shirlyn Goltz: Yes] Patrice Paradise: No] Edema: [Left: N] [Right: o] Calf Left: Right: Point of Measurement: 36 cm From Medial Instep 38 cm Ankle Left: Right: Point of Measurement: 12 cm From Medial Instep 22.7 cm Vascular Assessment Pulses: Dorsalis Pedis Palpable: [Left:Yes] Electronic Signature(s) Signed: 10/30/2021 4:43:44 PM By: Lorrin Stubblefield Entered By: Lorrin Paci on 10/30/2021 09:30:45 -------------------------------------------------------------------------------- Multi Wound Chart Details Patient Name: Date of Service: Alex Jackson, Alex BERT W. 10/30/2021 9:15 A M Medical Record Number: [id number] Patient Account Number: [id number] Date of Birth/Sex: Treating RN: Sep 28, 1945 (76 y.o. [name], [name] Primary Care Lona Six: London Pepper Other Clinician: Referring Khyri Hinzman: Treating Kartier Bennison/Extender: Merilyn Baba in Treatment: 22 Vital Signs Height(in): 64 Pulse(bpm): 99 Weight(lbs): 43 Blood Pressure(mmHg):  117/74 Body Mass Index(BMI): 35.1 Temperature(F): 98.2 Respiratory Rate(breaths/min): 18 Photos: [1:Left T Great oe] [N/A:N/A N/A] Wound Location: [1:Blister] [N/A:N/A] Wounding Event: [1:Diabetic Wound/Ulcer of the Lower] [N/A:N/A] Primary Etiology: [1:Extremity Cataracts, Coronary Artery Disease, N/A] Comorbid History: [1:Hypertension, Myocardial Infarction, Type II Diabetes 10/02/2020] [N/A:N/A] Date Acquired: [1:22] [N/A:N/A] Weeks of Treatment: [1:Open] [N/A:N/A] Wound Status: [1:No] [N/A:N/A] Wound Recurrence: [1:0.6x0.6x0.2] [N/A:N/A] Measurements L x W x D (cm) [1:0.283] [N/A:N/A] A (cm) : rea [1:0.057] [N/A:N/A] Volume (cm) : [1:0.00%] [N/A:N/A] % Reduction in A rea: [1:49.60%] [N/A:N/A] % Reduction in Volume: [  1:Grade 3] [N/A:N/A] Classification: [1:Medium] [N/A:N/A] Exudate A mount: [1:Serosanguineous] [N/A:N/A] Exudate Type: [1:red, brown] [N/A:N/A] Exudate Color: [1:Distinct, outline attached] [N/A:N/A] Wound Margin: [1:Large (67-100%)] [N/A:N/A] Granulation A mount: [1:Red, Pink] [N/A:N/A] Granulation Quality: [1:None Present (0%)] [N/A:N/A] Necrotic A mount: [1:Fat Layer (Subcutaneous Tissue): Yes N/A] Exposed Structures: [1:Fascia: No Tendon: No Muscle: No Joint: No Bone: No Medium (34-66%)] [N/A:N/A] Epithelialization: [1:Calloused Periwound] [N/A:N/A] Assessment Notes: [1:T Contact Cast otal] [N/A:N/A] Treatment Notes Wound #1 (Toe Great) Wound Laterality: Left Cleanser Peri-Wound Care Topical topical antibiotics Discharge Instruction: [facility] topical antibiotics apply a small amount to wound. Primary Dressing Hydrofera Blue Ready Foam, 2.5 x2.5 in Discharge Instruction: Apply to wound bed as instructed Secondary Dressing Woven Gauze Sponges 2x2 in Discharge Instruction: Apply over primary dressing as directed. Secured With Conforming Stretch Gauze Bandage, Sterile 2x75 (in/in) Discharge Instruction: Secure with stretch gauze as  directed. Compression Wrap Compression Stockings Add-Ons Electronic Signature(s) Signed: 10/30/2021 10:47:26 AM By: Kalman Shan DO Signed: 10/31/2021 11:43:29 AM By: Rhae Hammock RN Entered By: Kalman Shan on 10/30/2021 10:33:47 -------------------------------------------------------------------------------- Multi-Disciplinary Care Plan Details Patient Name: Date of Service: Alex Jackson, Alex BERT W. 10/30/2021 9:15 A M Medical Record Number: [id number] Patient Account Number: [id number] Date of Birth/Sex: Treating RN: 04-Dec-1945 (76 y.o. [name] Primary Care Olen Eaves: London Pepper Other Clinician: Referring Emylia Latella: Treating James Senn/Extender: Merilyn Baba in Treatment: 8 Multidisciplinary Care Plan reviewed with physician Active Inactive Nutrition Nursing Diagnoses: Potential for alteratiion in Nutrition/Potential for imbalanced nutrition Goals: Patient/caregiver agrees to and verbalizes understanding of need to obtain nutritional consultation Date Initiated: 05/26/2021 Date Inactivated: 06/06/2021 Target Resolution Date: 06/06/2021 Goal Status: Met Patient/caregiver will maintain therapeutic glucose control Date Initiated: 06/06/2021 Target Resolution Date: 11/01/2021 Goal Status: Active Interventions: Assess patient nutrition upon admission and as needed per policy Provide education on nutrition Treatment Activities: Education provided on Nutrition : 10/16/2021 Obtain HgA1c : 05/26/2021 Patient referred to Primary Care Physician for further nutritional evaluation : 05/26/2021 Notes: Wound/Skin Impairment Nursing Diagnoses: Knowledge deficit related to ulceration/compromised skin integrity Goals: Patient/caregiver will verbalize understanding of skin care regimen Date Initiated: 05/26/2021 Target Resolution Date: 11/01/2021 Goal Status: Active Ulcer/skin breakdown will have a volume reduction of 50% by week 8 Date Initiated:  06/06/2021 Target Resolution Date: 11/01/2021 Goal Status: Active Interventions: Assess patient/caregiver ability to perform ulcer/skin care regimen upon admission and as needed Assess ulceration(s) every visit Provide education on ulcer and skin care Treatment Activities: Skin care regimen initiated : 05/26/2021 Topical wound management initiated : 05/26/2021 Notes: Electronic Signature(s) Signed: 10/30/2021 4:43:44 PM By: Lorrin Coldren Entered By: Lorrin Polcyn on 10/30/2021 09:13:17 -------------------------------------------------------------------------------- Pain Assessment Details Patient Name: Date of Service: Alex Jackson, Alex BERT W. 10/30/2021 9:15 A M Medical Record Number: [id number] Patient Account Number: [id number] Date of Birth/Sex: Treating RN: 1946-02-24 (76 y.o. [name] Primary Care Rashelle Ireland: London Pepper Other Clinician: Referring Didi Ganaway: Treating Dragan Tamburrino/Extender: Merilyn Baba in Treatment: 22 Active Problems Location of Pain Severity and Description of Pain Patient Has Paino No Site Locations Pain Management and Medication Current Pain Management: Electronic Signature(s) Signed: 10/30/2021 4:43:44 PM By: Lorrin Elkhatib Entered By: Lorrin Ducey on 10/30/2021 09:20:04 -------------------------------------------------------------------------------- Patient/Caregiver Education Details Patient Name: Date of Service: Alex Jackson 6/29/2023andnbsp9:15 A M Medical Record Number: [id number] Patient Account Number: [id number] Date of Birth/Gender: Treating RN: 01/16/1946 (76 y.o. [name] Primary Care Physician: London Pepper Other Clinician: Referring Physician: Treating Physician/Extender: Merilyn Baba in Treatment:  22 Education Assessment Education Provided To: Patient Education Topics Provided Offloading: Methods: Explain/Verbal, Printed Responses: State content  correctly Wound/Skin Impairment: Methods: Explain/Verbal, Printed Responses: State content correctly Electronic Signature(s) Signed: 10/30/2021 4:43:44 PM By: Lorrin Akens Entered By: Lorrin Calvin on 10/30/2021 09:13:44 -------------------------------------------------------------------------------- Wound Assessment Details Patient Name: Date of Service: Alex Jackson, Alex BERT W. 10/30/2021 9:15 A M Medical Record Number: [id number] Patient Account Number: [id number] Date of Birth/Sex: Treating RN: June 12, 1945 (76 y.o. [name] Primary Care Aalyah Mansouri: London Pepper Other Clinician: Referring Mc Bloodworth: Treating Cash Duce/Extender: Merilyn Baba in Treatment: 22 Wound Status Wound Number: 1 Primary Diabetic Wound/Ulcer of the Lower Extremity Etiology: Wound Location: Left T Great oe Wound Open Wounding Event: Blister Status: Date Acquired: 10/02/2020 Comorbid Cataracts, Coronary Artery Disease, Hypertension, Myocardial Weeks Of Treatment: 22 History: Infarction, Type II Diabetes Clustered Wound: No Photos Wound Measurements Length: (cm) 0.6 % Redu Width: (cm) 0.6 % Redu Depth: (cm) 0.2 Epithe Area: (cm) 0.283 Tunne Volume: (cm) 0.057 Under ction in Area: 0% ction in Volume: 49.6% lialization: Medium (34-66%) ling: No mining: No Wound Description Classification: Grade 3 Foul O Wound Margin: Distinct, outline attached Slough Exudate Amount: Medium Exudate Type: Serosanguineous Exudate Color: red, brown dor After Cleansing: No /Fibrino No Wound Bed Granulation Amount: Large (67-100%) Exposed Structure Granulation Quality: Red, Pink Fascia Exposed: No Necrotic Amount: None Present (0%) Fat Layer (Subcutaneous Tissue) Exposed: Yes Tendon Exposed: No Muscle Exposed: No Joint Exposed: No Bone Exposed: No Assessment Notes Calloused Periwound Treatment Notes Wound #1 (Toe Great) Wound Laterality: Left Cleanser Peri-Wound  Care Topical topical antibiotics Discharge Instruction: [facility] topical antibiotics apply a small amount to wound. Primary Dressing Hydrofera Blue Ready Foam, 2.5 x2.5 in Discharge Instruction: Apply to wound bed as instructed Secondary Dressing Woven Gauze Sponges 2x2 in Discharge Instruction: Apply over primary dressing as directed. Secured With Conforming Stretch Gauze Bandage, Sterile 2x75 (in/in) Discharge Instruction: Secure with stretch gauze as directed. Compression Wrap Compression Stockings Add-Ons Electronic Signature(s) Signed: 10/30/2021 4:43:44 PM By: Lorrin Spiering Entered By: Lorrin Brass on 10/30/2021 09:30:31 -------------------------------------------------------------------------------- Vitals Details Patient Name: Date of Service: Alex Jackson, Alex BERT W. 10/30/2021 9:15 A M Medical Record Number: [id number] Patient Account Number: [id number] Date of Birth/Sex: Treating RN: Jun 16, 1945 (76 y.o. [name] Primary Care Lakresha Stifter: London Pepper Other Clinician: Referring Teja Costen: Treating Nykira Reddix/Extender: Merilyn Baba in Treatment: 22 Vital Signs Time Taken: 09:19 Temperature (F): 98.2 Height (in): 71 Pulse (bpm): 75 Weight (lbs): 252 Respiratory Rate (breaths/min): 18 Body Mass Index (BMI): 35.1 Blood Pressure (mmHg): 117/74 Reference Range: 80 - 120 mg / dl Electronic Signature(s) Signed: 10/30/2021 4:43:44 PM By: Lorrin Staib Entered By: Lorrin Pless on 10/30/2021 09:19:56

## 2021-10-31 NOTE — Progress Notes (Signed)
Alex Jackson ([id number]) Visit Report for 10/30/2021 Chief Complaint Document Details Patient Name: Date of Service: Alex Jackson. 10/30/2021 9:15 A M Medical Record Number: [id number] Patient Account Number: [id number] Date of Birth/Sex: Treating RN: January 12, 1946 (76 y.o. Alex Jackson, [name] Primary Care Provider: London Jackson Other Clinician: Referring Provider: Treating Provider/Extender: Merilyn Baba in Treatment: 22 Information Obtained from: Patient Chief Complaint Left great toe wound Electronic Signature(s) Signed: 10/30/2021 10:47:26 AM By: Alex Jackson Entered By: Alex Jackson on 10/30/2021 10:34:09 -------------------------------------------------------------------------------- HPI Details Patient Name: Date of Service: Alex Jackson, Alex BERT W. 10/30/2021 9:15 A M Medical Record Number: [id number] Patient Account Number: [id number] Date of Birth/Sex: Treating RN: 20-Feb-1946 (76 y.o. Alex Jackson Primary Care Provider: London Jackson Other Clinician: Referring Provider: Treating Provider/Extender: Merilyn Baba in Treatment: 22 History of Present Illness HPI Description: Admission 05/26/2021 Alex Jackson is a 76 year old male with a past medical history of controlled type 2 diabetes on oral agents, chronic back pain and coronary artery disease status post stenting to the LAD that presents to the clinic for a 94-month history of nonhealing wound to the left great toe plantar aspect. He has peripheral neuropathy and is not sure how the wound developed. He has been following with Alex Jackson for this issue and has been using Betadine to the area daily. He does not offload the area. He denies signs of infection. 2/3; patient presents for follow-up. He obtained his left foot x-ray. He has been using gentamicin with hydrofera blue toe to the wound bed with dressing changes. He has no issues or  complaints today. He denies signs of infection. 2/17; patient presents for follow-up. He had his MRI that showed bone marrow edema at the distal aspect concerning for osteomyelitis. Currently patient denies signs of infection. He feels well and has been using gentamicin with Hydrofera Blue to the wound bed. He states that he has ordered custom diabetic shoes. He obtains these at the end of the month. 3/3; patient presents for follow-up. He had a scheduled appointment with Alex Jackson, infectious disease yesterday. Patient canceled and at this time does not want to follow-up with any other doctors for this issue. He currently denies signs of infection. He has been using gentamicin ointment with Hydrofera Blue to the wound bed. He has orthopedic shoes and has an area that offloads the left great toe. 3/17; patient presents for follow-up. He reports stubbing his left great toe that has the wound. He has been using gentamicin ointment and Hydrofera Blue with dressing changes. He has orthopedic shoes that offload the left great toe however he is not wearing them today and he confirms that he does not wear these every day. 3/27; patient presents for follow-up. He has his surgical shoe with a cut out to the left great toe. Unfortunately his foot is shifting in the shoe and pressure continues as noted by the drainage location. He continues to use gentamicin ointment with Hydrofera Blue. He denies signs of infection. He reports less drainage to the wound site. 5/5; patient presents for follow-up. Unfortunately the patient did not follow-up as recommended. I have not seen this patient in over 4 weeks. He has no issues or complaints today. He states he is using gentamicin ointment and Hydrofera Blue however the blue dressing is not staying in place. He denies signs of systemic infection. 5/16; patient presents for follow-up. He obtained his Keystone antibiotics and has been using  this daily for the past week.  He reports improvement in wound healing. Due to financial constraints he states he would only be able to follow-up every 3 weeks. 6/15; patient presents for follow-up. He has been using Keystone antibiotic ointment daily. It is unclear if he is able to Jackson this properly since he cannot reach his feet very easily. He has nobody to help him with dressing changes. He had an MRI of his left foot completed on 06/14/2021 that showed changes concerning for osteomyelitis to the left great toe. At that time I had referred him to infectious disease however he did not show up to his appointment and did not want to follow-up at that time. Today he states he would like to see infectious disease. We set up an appointment for him next Thursday at 46 with Alex Jackson. He currently denies signs of infection. He is not able to aggressively offload this area. He is open to doing the total contact cast. 6/23; patient presents for follow-up. He saw Alex Jackson with infectious disease for MRI findings suggesting osteomyelitis in the distal tuft of the left great toe. CRP and sed rate were normal. Patient was started on Bactrim and Augmentin. No active signs of acute infection at this time. Patient has been using Keystone antibiotics. He is open to doing the total contact cast today. 6/26; patient presents for follow-up. Patient has been taking Bactrim and Augmentin without issues. He had a total contact cast placed last Friday and presents for obligatory cast change. He reports no issues with the cast. 6/29; patient presents for follow-up. He presents for his obligatory cast exchange. He had no issues with the cast over the past couple of days. Electronic Signature(s) Signed: 10/30/2021 10:47:26 AM By: Alex Jackson Entered By: Alex Jackson on 10/30/2021 10:44:49 -------------------------------------------------------------------------------- Physical Exam Details Patient Name: Date of Service: Alex [place] W. 10/30/2021 9:15 A M Medical Record Number: [id number] Patient Account Number: [id number] Date of Birth/Sex: Treating RN: 10/09/1945 (77 y.o. Alex Jackson Primary Care Provider: London Jackson Other Clinician: Referring Provider: Treating Provider/Extender: Merilyn Baba in Treatment: 22 Constitutional respirations regular, non-labored and within target range for patient.. Cardiovascular 2+ dorsalis pedis/posterior tibialis pulses. Psychiatric pleasant and cooperative. Notes Left great toe: T the plantar aspect there is an open wound with granulation tissue throughout. o Electronic Signature(s) Signed: 10/30/2021 10:47:26 AM By: Alex Jackson Entered By: Alex Jackson on 10/30/2021 10:45:10 -------------------------------------------------------------------------------- Physician Orders Details Patient Name: Date of Service: Alex Jackson, Alex BERT W. 10/30/2021 9:15 A M Medical Record Number: [id number] Patient Account Number: [id number] Date of Birth/Sex: Treating RN: 12-08-1945 (76 y.o. [name] Primary Care Provider: London Jackson Other Clinician: Referring Provider: Treating Provider/Extender: Merilyn Baba in Treatment: 57 Verbal / Phone Orders: No Diagnosis Coding ICD-10 Coding Code Description [phone] Non-pressure chronic ulcer of other part of left foot with fat layer exposed E11.621 Type 2 diabetes mellitus with foot ulcer Z95.5 Presence of coronary angioplasty implant and graft E78.5 Hyperlipidemia, unspecified M54.50 Low back pain, unspecified Follow-up Appointments ppointment in 1 week. - Thursday 10:00am 11/07/2021 w/ Dr. Heber Humboldt and Allayne Butcher Room # 9 Return A Bathing/ Shower/ Hygiene May shower and wash wound with soap and water. - with dressing changes. Edema Control - Lymphedema / SCD / Other Elevate legs to the level of the heart or above for 30 minutes daily and/or when sitting, a  frequency of: - 3-4 times a day throughout  the day. Avoid standing for long periods of time. Moisturize legs daily. - every night before bed. Off-Loading Total Contact Cast to Left Lower Extremity Wound Treatment Wound #1 - T Great oe Wound Laterality: Left Topical: topical antibiotics 1 x Per Week [facility]charge Instructions: [facility] apply a small amount to wound. Prim Dressing: Hydrofera Blue Ready Foam, 2.5 x2.5 in 1 x Per Week ary Discharge Instructions: Apply to wound bed as instructed Secondary Dressing: Woven Gauze Sponges 2x2 in 1 x Per Week Discharge Instructions: Apply over primary dressing as directed. Secured With: Child psychotherapist, Sterile 2x75 (in/in) 1 x Per Week Discharge Instructions: Secure with stretch gauze as directed. Electronic Signature(s) Signed: 10/30/2021 10:47:26 AM By: Alex Jackson Entered By: Alex Jackson on 10/30/2021 10:45:18 -------------------------------------------------------------------------------- Problem List Details Patient Name: Date of Service: Alex Jackson, Alex BERT W. 10/30/2021 9:15 A M Medical Record Number: [id number] Patient Account Number: [id number] Date of Birth/Sex: Treating RN: January 31, 1946 (76 y.o. [name] Primary Care Provider: London Jackson Other Clinician: Referring Provider: Treating Provider/Extender: Merilyn Baba in Treatment: 66 Active Problems ICD-10 Encounter Code Description Active Date MDM Diagnosis [phone] Non-pressure chronic ulcer of other part of left foot with fat layer exposed 05/26/2021 No Yes E11.621 Type 2 diabetes mellitus with foot ulcer 05/26/2021 No Yes Z95.5 Presence of coronary angioplasty implant and graft 05/26/2021 No Yes E78.5 Hyperlipidemia, unspecified 05/26/2021 No Yes M54.50 Low back pain, unspecified 05/26/2021 No Yes Inactive Problems Resolved Problems Electronic Signature(s) Signed: 10/30/2021 10:47:26 AM  By: Alex Jackson Entered By: Alex Jackson on 10/30/2021 10:33:40 -------------------------------------------------------------------------------- Progress Note Details Patient Name: Date of Service: Alex Jackson, Alex BERT W. 10/30/2021 9:15 A M Medical Record Number: [id number] Patient Account Number: [id number] Date of Birth/Sex: Treating RN: [birth year] (76 y.o. Alex Jackson, [name] Primary Care Provider: London Jackson Other Clinician: Referring Provider: Treating Provider/Extender: Merilyn Baba in Treatment: 67 Subjective Chief Complaint Information obtained from Patient Left great toe wound History of Present Illness (HPI) Admission 05/26/2021 Mr. Naki Wax is a 76 year old male with a past medical history of controlled type 2 diabetes on oral agents, chronic back pain and coronary artery disease status post stenting to the LAD that presents to the clinic for a 28-month history of nonhealing wound to the left great toe plantar aspect. He has peripheral neuropathy and is not sure how the wound developed. He has been following with Alex Jackson for this issue and has been using Betadine to the area daily. He does not offload the area. He denies signs of infection. 2/3; patient presents for follow-up. He obtained his left foot x-ray. He has been using gentamicin with hydrofera blue toe to the wound bed with dressing changes. He has no issues or complaints today. He denies signs of infection. 2/17; patient presents for follow-up. He had his MRI that showed bone marrow edema at the distal aspect concerning for osteomyelitis. Currently patient denies signs of infection. He feels well and has been using gentamicin with Hydrofera Blue to the wound bed. He states that he has ordered custom diabetic shoes. He obtains these at the end of the month. 3/3; patient presents for follow-up. He had a scheduled appointment with Alex Jackson, infectious disease yesterday.  Patient canceled and at this time does not want to follow-up with any other doctors for this issue. He currently denies signs of infection. He has been using gentamicin ointment with Hydrofera Blue to the wound bed. He has  orthopedic shoes and has an area that offloads the left great toe. 3/17; patient presents for follow-up. He reports stubbing his left great toe that has the wound. He has been using gentamicin ointment and Hydrofera Blue with dressing changes. He has orthopedic shoes that offload the left great toe however he is not wearing them today and he confirms that he does not wear these every day. 3/27; patient presents for follow-up. He has his surgical shoe with a cut out to the left great toe. Unfortunately his foot is shifting in the shoe and pressure continues as noted by the drainage location. He continues to use gentamicin ointment with Hydrofera Blue. He denies signs of infection. He reports less drainage to the wound site. 5/5; patient presents for follow-up. Unfortunately the patient did not follow-up as recommended. I have not seen this patient in over 4 weeks. He has no issues or complaints today. He states he is using gentamicin ointment and Hydrofera Blue however the blue dressing is not staying in place. He denies signs of systemic infection. 5/16; patient presents for follow-up. He obtained his [facility] antibiotics and has been using this daily for the past week. He reports improvement in wound healing. Due to financial constraints he states he would only be able to follow-up every 3 weeks. 6/15; patient presents for follow-up. He has been using Keystone antibiotic ointment daily. It is unclear if he is able to Jackson this properly since he cannot reach his feet very easily. He has nobody to help him with dressing changes. He had an MRI of his left foot completed on 06/14/2021 that showed changes concerning for osteomyelitis to the left great toe. At that time I had referred him  to infectious disease however he did not show up to his appointment and did not want to follow-up at that time. Today he states he would like to see infectious disease. We set up an appointment for him next Thursday at 50 with Alex Jackson. He currently denies signs of infection. He is not able to aggressively offload this area. He is open to doing the total contact cast. 6/23; patient presents for follow-up. He saw Alex Jackson with infectious disease for MRI findings suggesting osteomyelitis in the distal tuft of the left great toe. CRP and sed rate were normal. Patient was started on Bactrim and Augmentin. No active signs of acute infection at this time. Patient has been using Keystone antibiotics. He is open to doing the total contact cast today. 6/26; patient presents for follow-up. Patient has been taking Bactrim and Augmentin without issues. He had a total contact cast placed last Friday and presents for obligatory cast change. He reports no issues with the cast. 6/29; patient presents for follow-up. He presents for his obligatory cast exchange. He had no issues with the cast over the past couple of days. Patient History Information obtained from Patient. Family History Cancer - Father,Mother, Hypertension - Mother, No family history of Diabetes, Heart Disease, Hereditary Spherocytosis, Kidney Disease, Lung Disease, Seizures, Stroke, Thyroid Problems, Tuberculosis. Social History Former smoker - quit 1985, Alcohol Use - Daily - wine or scotch, Drug Use - No History, Caffeine Use - Never. Medical History Eyes Patient has history of Cataracts - removed Denies history of Glaucoma, Optic Neuritis Ear/Nose/Mouth/Throat Denies history of Chronic sinus problems/congestion, Middle ear problems Hematologic/Lymphatic Denies history of Hemophilia, Human Immunodeficiency Virus, Lymphedema, Sickle Cell Disease Respiratory Denies history of Aspiration, Asthma, Chronic Obstructive Pulmonary Disease  (COPD), Pneumothorax, Sleep Apnea, Tuberculosis Cardiovascular Patient  has history of Coronary Artery Disease, Hypertension, Myocardial Infarction - NSTEMI Denies history of Angina, Arrhythmia, Congestive Heart Failure, Deep Vein Thrombosis, Hypotension, Peripheral Arterial Disease, Peripheral Venous Disease, Phlebitis, Vasculitis Gastrointestinal Denies history of Cirrhosis , Colitis, Crohnoos, Hepatitis A, Hepatitis B, Hepatitis C Endocrine Patient has history of Type II Diabetes - PCP notes type II; per patient takes metformin been told prediabetic Denies history of Type I Diabetes Genitourinary Denies history of End Stage Renal Disease Immunological Denies history of Lupus Erythematosus, Raynaudoos, Scleroderma Integumentary (Skin) Denies history of History of Burn Musculoskeletal Denies history of Gout, Rheumatoid Arthritis, Osteoarthritis, Osteomyelitis Neurologic Denies history of Dementia, Neuropathy, Quadriplegia, Paraplegia, Seizure Disorder Oncologic Denies history of Received Chemotherapy, Received Radiation Psychiatric Denies history of Anorexia/bulimia, Confinement Anxiety Hospitalization/Surgery History - stents MRI 2017and2019. - torn meniscus left 5 weeks ago. Medical A Surgical History Notes nd Constitutional Symptoms (General Health) Back pain x6 months Cardiovascular hyperlipidemia Genitourinary Elevated PSA Objective Constitutional respirations regular, non-labored and within target range for patient.. Vitals Time Taken: 9:19 AM, Height: 71 in, Weight: 252 lbs, BMI: 35.1, Temperature: 98.2 F, Pulse: 75 bpm, Respiratory Rate: 18 breaths/min, Blood Pressure: 117/74 mmHg. Cardiovascular 2+ dorsalis pedis/posterior tibialis pulses. Psychiatric pleasant and cooperative. General Notes: Left great toe: T the plantar aspect there is an open wound with granulation tissue throughout. o Integumentary (Hair, Skin) Wound #1 status is Open. Original cause of  wound was Blister. The date acquired was: 10/02/2020. The wound has been in treatment 22 weeks. The wound is located on the Left T Great. The wound measures 0.6cm length x 0.6cm width x 0.2cm depth; 0.283cm^2 area and 0.057cm^3 volume. There is Fat Layer oe (Subcutaneous Tissue) exposed. There is no tunneling or undermining noted. There is a medium amount of serosanguineous drainage noted. The wound margin is distinct with the outline attached to the wound base. There is large (67-100%) red, pink granulation within the wound bed. There is no necrotic tissue within the wound bed. General Notes: Calloused Periwound Assessment Active Problems ICD-10 Non-pressure chronic ulcer of other part of left foot with fat layer exposed Type 2 diabetes mellitus with foot ulcer Presence of coronary angioplasty implant and graft Hyperlipidemia, unspecified Low back pain, unspecified Patient did well with the total contact cast. The wound has shown improvement in size in a very short period of time. I recommended continuing with Keystone antibiotic and Hydrofera Blue. The total contact cast was replaced in standard fashion today. Procedures Wound #1 Pre-procedure diagnosis of Wound #1 is a Diabetic Wound/Ulcer of the Lower Extremity located on the Left T Great . There was a T Contact Cast oe otal Procedure by Geralyn Corwin, Jackson. Post procedure Diagnosis Wound #1: Same as Pre-Procedure Plan Follow-up Appointments: Return Appointment in 1 week. - Thursday 10:00am 11/07/2021 w/ Dr. Mikey Bussing and Maryruth Bun Room # 9 Bathing/ Shower/ Hygiene: May shower and wash wound with soap and water. - with dressing changes. Edema Control - Lymphedema / SCD / Other: Elevate legs to the level of the heart or above for 30 minutes daily and/or when sitting, a frequency of: - 3-4 times a day throughout the day. Avoid standing for long periods of time. Moisturize legs daily. - every night before bed. Off-Loading: T Contact Cast to  Left Lower Extremity otal WOUND #1: - T Great Wound Laterality: Left oe Topical: topical antibiotics 1 x Per Week/ Discharge Instructions: [facility] apply a small amount to wound. Prim Dressing: Hydrofera Blue Ready Foam, 2.5 x2.5 in 1 x  Per Week/ ary Discharge Instructions: Apply to wound bed as instructed Secondary Dressing: Woven Gauze Sponges 2x2 in 1 x Per Week/ Discharge Instructions: Apply over primary dressing as directed. Secured With: Insurance underwriter, Sterile 2x75 (in/in) 1 x Per Week/ Discharge Instructions: Secure with stretch gauze as directed. 1. [facility] antibiotic with Hydrofera Blue under total contact cast 2. Follow-up in 1 week Electronic Signature(s) Signed: 10/30/2021 10:47:26 AM By: Geralyn Corwin Jackson Entered By: Geralyn Corwin on 10/30/2021 10:46:55 -------------------------------------------------------------------------------- HxROS Details Patient Name: Date of Service: Alex Jackson, Alex BERT W. 10/30/2021 9:15 A M Medical Record Number: [id number] Patient Account Number: [id number] Date of Birth/Sex: Treating RN: [birth year] (76 y.o. [name], [name] Primary Care Provider: [name] Other Clinician: Referring Provider: Treating Provider/Extender: Zena Amos in Treatment: 22 Information Obtained From Patient Constitutional Symptoms (General Health) Medical History: Past Medical History Notes: Back pain x6 months Eyes Medical History: Positive for: Cataracts - removed Negative for: Glaucoma; Optic Neuritis Ear/Nose/Mouth/Throat Medical History: Negative for: Chronic sinus problems/congestion; Middle ear problems Hematologic/Lymphatic Medical History: Negative for: Hemophilia; Human Immunodeficiency Virus; Lymphedema; Sickle Cell Disease Respiratory Medical History: Negative for: Aspiration; Asthma; Chronic Obstructive Pulmonary Disease (COPD); Pneumothorax; Sleep  Apnea; Tuberculosis Cardiovascular Medical History: Positive for: Coronary Artery Disease; Hypertension; Myocardial Infarction - NSTEMI Negative for: Angina; Arrhythmia; Congestive Heart Failure; Deep Vein Thrombosis; Hypotension; Peripheral Arterial Disease; Peripheral Venous Disease; Phlebitis; Vasculitis Past Medical History Notes: hyperlipidemia Gastrointestinal Medical History: Negative for: Cirrhosis ; Colitis; Crohns; Hepatitis A; Hepatitis B; Hepatitis C Endocrine Medical History: Positive for: Type II Diabetes - PCP notes type II; per patient takes metformin been told prediabetic Negative for: Type I Diabetes Time with diabetes: prediabetic 3-4 years per pt Treated with: Oral agents, Diet Blood sugar tested every day: No Genitourinary Medical History: Negative for: End Stage Renal Disease Past Medical History Notes: Elevated PSA Immunological Medical History: Negative for: Lupus Erythematosus; Raynauds; Scleroderma Integumentary (Skin) Medical History: Negative for: History of Burn Musculoskeletal Medical History: Negative for: Gout; Rheumatoid Arthritis; Osteoarthritis; Osteomyelitis Neurologic Medical History: Negative for: Dementia; Neuropathy; Quadriplegia; Paraplegia; Seizure Disorder Oncologic Medical History: Negative for: Received Chemotherapy; Received Radiation Psychiatric Medical History: Negative for: Anorexia/bulimia; Confinement Anxiety HBO Extended History Items Eyes: Cataracts Immunizations Pneumococcal Vaccine: Received Pneumococcal Vaccination: Yes Received Pneumococcal Vaccination On or After 60th Birthday: Yes Implantable Devices No devices added Hospitalization / Surgery History Type of Hospitalization/Surgery stents MRI 2017and2019 torn meniscus left 5 weeks ago Family and Social History Cancer: Yes - Father,Mother; Diabetes: No; Heart Disease: No; Hereditary Spherocytosis: No; Hypertension: Yes - Mother; Kidney Disease: No;  Lung Disease: No; Seizures: No; Stroke: No; Thyroid Problems: No; Tuberculosis: No; Former smoker - quit 1985; Alcohol Use: Daily - wine or scotch; Drug Use: No History; Caffeine Use: Never; Financial Concerns: No; Food, Clothing or Shelter Needs: No; Support System Lacking: No; Transportation Concerns: No Electronic Signature(s) Signed: 10/30/2021 10:47:26 AM By: Geralyn Corwin Jackson Signed: 10/31/2021 11:43:29 AM By: Fonnie Mu RN Entered By: Geralyn Corwin on 10/30/2021 10:44:53 -------------------------------------------------------------------------------- Total Contact Cast Details Patient Name: Date of Service: Alex Jackson, Alex BERT W. 10/30/2021 9:15 A M Medical Record Number: [id number] Patient Account Number: [id number] Date of Birth/Sex: Treating RN: 1945/12/31 (76 y.o. Lytle Michaels Primary Care Provider: [name] Other Clinician: Referring Provider: Treating Provider/Extender: Zena Amos in Treatment: 22 T Contact Cast Applied for Wound Assessment: otal Wound #1 Left T Great oe Performed By: Physician Geralyn Corwin, Jackson Post Procedure Diagnosis Same as  Pre-procedure Electronic Signature(s) Signed: 10/30/2021 10:47:26 AM By: Alex Jackson Signed: 10/30/2021 4:43:44 PM By: Lorrin Haydel Entered By: Lorrin Heo on 10/30/2021 09:50:43 -------------------------------------------------------------------------------- SuperBill Details Patient Name: Date of Service: Alex Jackson, Alex BERT W. 10/30/2021 Medical Record Number: [id number] Patient Account Number: [id number] Date of Birth/Sex: Treating RN: [birth year] (76 y.o. [name] Primary Care Provider: London Jackson Other Clinician: Referring Provider: Treating Provider/Extender: Merilyn Baba in Treatment: 22 Diagnosis Coding ICD-10 Codes Code Description [phone] Non-pressure chronic ulcer of other part of left foot with fat layer  exposed E11.621 Type 2 diabetes mellitus with foot ulcer Z95.5 Presence of coronary angioplasty implant and graft E78.5 Hyperlipidemia, unspecified M54.50 Low back pain, unspecified Facility Procedures CPT4 Code: [id number] Description: 29445 - APPLY TOTAL CONTACT LEG CAST ICD-10 Diagnosis Description L97.522 Non-pressure chronic ulcer of other part of left foot with fat layer exposed E11.621 Type 2 diabetes mellitus with foot ulcer Modifier: Quantity: 1 Physician Procedures : CPT4 Code Description Modifier [id number] - WC PHYS APPLY TOTAL CONTACT CAST ICD-10 Diagnosis Description L97.522 Non-pressure chronic ulcer of other part of left foot with fat layer exposed E11.621 Type 2 diabetes mellitus with foot ulcer Quantity: 1 Electronic Signature(s) Signed: 10/30/2021 10:47:26 AM By: Alex Jackson Entered By: Alex Jackson on 10/30/2021 10:47:06

## 2021-11-05 DIAGNOSIS — M25652 Stiffness of left hip, not elsewhere classified: Secondary | ICD-10-CM | POA: Diagnosis not present

## 2021-11-05 DIAGNOSIS — M25651 Stiffness of right hip, not elsewhere classified: Secondary | ICD-10-CM | POA: Diagnosis not present

## 2021-11-05 DIAGNOSIS — M5117 Intervertebral disc disorders with radiculopathy, lumbosacral region: Secondary | ICD-10-CM | POA: Diagnosis not present

## 2021-11-05 DIAGNOSIS — R262 Difficulty in walking, not elsewhere classified: Secondary | ICD-10-CM | POA: Diagnosis not present

## 2021-11-06 ENCOUNTER — Encounter (HOSPITAL_BASED_OUTPATIENT_CLINIC_OR_DEPARTMENT_OTHER): Payer: PPO | Attending: Internal Medicine | Admitting: Internal Medicine

## 2021-11-06 DIAGNOSIS — Z955 Presence of coronary angioplasty implant and graft: Secondary | ICD-10-CM | POA: Insufficient documentation

## 2021-11-06 DIAGNOSIS — E785 Hyperlipidemia, unspecified: Secondary | ICD-10-CM | POA: Insufficient documentation

## 2021-11-06 DIAGNOSIS — E11621 Type 2 diabetes mellitus with foot ulcer: Secondary | ICD-10-CM | POA: Diagnosis not present

## 2021-11-06 DIAGNOSIS — M545 Low back pain, unspecified: Secondary | ICD-10-CM | POA: Diagnosis not present

## 2021-11-06 DIAGNOSIS — L97522 Non-pressure chronic ulcer of other part of left foot with fat layer exposed: Secondary | ICD-10-CM | POA: Insufficient documentation

## 2021-11-06 NOTE — Progress Notes (Signed)
Alex Jackson, Alex Jackson ([id number]) Visit Report for 11/06/2021 Arrival Information Details Patient Name: Date of Service: Alex Jackson. 11/06/2021 9:30 A M Medical Record Number: [id number] Patient Account Number: [id number] Date of Birth/Sex: Treating RN: 1945/10/24 (76 y.o. [name], [name] Primary Care Kol Consuegra: London Pepper Other Clinician: Referring Dashauna Heymann: Treating Deanna Boehlke/Extender: Merilyn Baba in Treatment: 23 Visit Information History Since Last Visit Added or deleted any medications: No Patient Arrived: Alex Jackson Any new allergies or adverse reactions: No Arrival Time: 10:49 Had a fall or experienced change in No Accompanied By: self activities of daily living that may affect Transfer Assistance: Manual risk of falls: Patient Identification Verified: Yes Signs or symptoms of abuse/neglect since last visito No Secondary Verification Process Completed: Yes Hospitalized since last visit: No Patient Requires Transmission-Based Precautions: No Implantable device outside of the clinic excluding No Patient Has Alerts: Yes cellular tissue based products placed in the center Patient Alerts: 11/18/20 ABI L1.04 since last visit: 11/18/20 TBI L 1.35 Has Dressing in Place as Prescribed: Yes Pain Present Now: No Electronic Signature(s) Signed: 11/06/2021 4:05:15 PM By: Rhae Hammock RN Entered By: Rhae Hammock on 11/06/2021 10:49:35 -------------------------------------------------------------------------------- Encounter Discharge Information Details Patient Name: Date of Service: Alex Jackson, Alex BERT W. 11/06/2021 9:30 A M Medical Record Number: [id number] Patient Account Number: [id number] Date of Birth/Sex: Treating RN: Mar 31, 1946 (76 y.o. [name], [name] Primary Care Fusae Florio: London Pepper Other Clinician: Referring Jearlene Bridwell: Treating Lerline Valdivia/Extender: Merilyn Baba in Treatment: 23 Encounter Discharge  Information Items Discharge Condition: Stable Ambulatory Status: Walker Discharge Destination: Home Transportation: Private Auto Accompanied By: self Schedule Follow-up Appointment: Yes Clinical Summary of Care: Patient Declined Electronic Signature(s) Signed: 11/06/2021 4:05:15 PM By: Rhae Hammock RN Entered By: Rhae Hammock on 11/06/2021 11:56:25 -------------------------------------------------------------------------------- Lower Extremity Assessment Details Patient Name: Date of Service: Alex [place] W. 11/06/2021 9:30 A M Medical Record Number: [id number] Patient Account Number: [id number] Date of Birth/Sex: Treating RN: Sep 29, 1945 (76 y.o. [name], [name] Primary Care Sohrab Keelan: London Pepper Other Clinician: Referring Dareon Nunziato: Treating Pearl Bents/Extender: Merilyn Baba in Treatment: 23 Edema Assessment Assessed: Shirlyn Goltz: Yes] Patrice Paradise: No] Edema: [Left: N] [Right: o] Calf Left: Right: Point of Measurement: 36 cm From Medial Instep 38 cm Ankle Left: Right: Point of Measurement: 12 cm From Medial Instep 22.7 cm Vascular Assessment Pulses: Dorsalis Pedis Palpable: [Left:Yes] Posterior Tibial Palpable: [Left:Yes] Electronic Signature(s) Signed: 11/06/2021 4:05:15 PM By: Rhae Hammock RN Entered By: Rhae Hammock on 11/06/2021 10:51:42 -------------------------------------------------------------------------------- Multi Wound Chart Details Patient Name: Date of Service: Alex Jackson, Alex BERT W. 11/06/2021 9:30 A M Medical Record Number: [id number] Patient Account Number: [id number] Date of Birth/Sex: Treating RN: [birth year] (76 y.o. [name], [name] Primary Care Farrell Broerman: London Pepper Other Clinician: Referring Briscoe Daniello: Treating Joanell Cressler/Extender: Merilyn Baba in Treatment: 23 Vital Signs Height(in): 77 Pulse(bpm): 79 Weight(lbs): 42 Blood Pressure(mmHg): 131/84 Body Mass Index(BMI):  35.1 Temperature(F): 98 Respiratory Rate(breaths/min): 12 Photos: [1:Left T Great oe] [N/A:N/A N/A] Wound Location: [1:Blister] [N/A:N/A] Wounding Event: [1:Diabetic Wound/Ulcer of the Lower] [N/A:N/A] Primary Etiology: [1:Extremity Cataracts, Coronary Artery Disease, N/A] Comorbid History: [1:Hypertension, Myocardial Infarction, Type II Diabetes 10/02/2020] [N/A:N/A] Date Acquired: [1:23] [N/A:N/A] Weeks of Treatment: [1:Open] [N/A:N/A] Wound Status: [1:No] [N/A:N/A] Wound Recurrence: [1:0.6x0.3x0.2] [N/A:N/A] Measurements L x W x D (cm) [1:0.141] [N/A:N/A] A (cm) : rea [1:0.028] [N/A:N/A] Volume (cm) : [1:50.20%] [N/A:N/A] % Reduction in A rea: [1:75.20%] [N/A:N/A] % Reduction in Volume: [1:9] Starting Position 1 (o'clock): [1:6]  Ending Position 1 (o'clock): [1:0.3] Maximum Distance 1 (cm): [1:Yes] [N/A:N/A] Undermining: [1:Grade 3] [N/A:N/A] Classification: [1:Medium] [N/A:N/A] Exudate A mount: [1:Serosanguineous] [N/A:N/A] Exudate Type: [1:red, brown] [N/A:N/A] Exudate Color: [1:Distinct, outline attached] [N/A:N/A] Wound Margin: [1:Large (67-100%)] [N/A:N/A] Granulation A mount: [1:Red, Pink] [N/A:N/A] Granulation Quality: [1:None Present (0%)] [N/A:N/A] Necrotic A mount: [1:Fat Layer (Subcutaneous Tissue): Yes N/A] Exposed Structures: [1:Fascia: No Tendon: No Muscle: No Joint: No Bone: No Medium (34-66%)] [N/A:N/A] Epithelialization: [1:T Contact Cast otal] [N/A:N/A] Treatment Notes Wound #1 (Toe Great) Wound Laterality: Left Cleanser Peri-Wound Care Topical topical antibiotics Discharge Instruction: [facility] topical antibiotics apply a small amount to wound. Primary Dressing Hydrofera Blue Ready Foam, 2.5 x2.5 in Discharge Instruction: Apply to wound bed as instructed Secondary Dressing Woven Gauze Sponges 2x2 in Discharge Instruction: Apply over primary dressing as directed. Secured With Conforming Stretch Gauze Bandage, Sterile 2x75  (in/in) Discharge Instruction: Secure with stretch gauze as directed. Compression Wrap Compression Stockings Add-Ons Electronic Signature(s) Signed: 11/06/2021 12:03:16 PM By: Kalman Shan DO Signed: 11/06/2021 4:05:15 PM By: Rhae Hammock RN Entered By: Kalman Shan on 11/06/2021 11:58:02 -------------------------------------------------------------------------------- Multi-Disciplinary Care Plan Details Patient Name: Date of Service: Alex Jackson, Alex BERT W. 11/06/2021 9:30 A M Medical Record Number: [id number] Patient Account Number: [id number] Date of Birth/Sex: Treating RN: 12/05/1945 (76 y.o. [name], [name] Primary Care Heriberto Stmartin: London Pepper Other Clinician: Referring Kylar Leonhardt: Treating Kaylen Motl/Extender: Merilyn Baba in Treatment: 23 Multidisciplinary Care Plan reviewed with physician Active Inactive Nutrition Nursing Diagnoses: Potential for alteratiion in Nutrition/Potential for imbalanced nutrition Goals: Patient/caregiver agrees to and verbalizes understanding of need to obtain nutritional consultation Date Initiated: 05/26/2021 Date Inactivated: 06/06/2021 Target Resolution Date: 06/06/2021 Goal Status: Met Patient/caregiver will maintain therapeutic glucose control Date Initiated: 06/06/2021 Target Resolution Date: 11/28/2021 Goal Status: Active Interventions: Assess patient nutrition upon admission and as needed per policy Provide education on nutrition Treatment Activities: Education provided on Nutrition : 09/05/2021 Obtain HgA1c : 05/26/2021 Patient referred to Primary Care Physician for further nutritional evaluation : 05/26/2021 Notes: Wound/Skin Impairment Nursing Diagnoses: Knowledge deficit related to ulceration/compromised skin integrity Goals: Patient/caregiver will verbalize understanding of skin care regimen Date Initiated: 05/26/2021 Target Resolution Date: 11/28/2021 Goal Status: Active Ulcer/skin breakdown will  have a volume reduction of 50% by week 8 Date Initiated: 06/06/2021 Target Resolution Date: 11/28/2021 Goal Status: Active Interventions: Assess patient/caregiver ability to perform ulcer/skin care regimen upon admission and as needed Assess ulceration(s) every visit Provide education on ulcer and skin care Treatment Activities: Skin care regimen initiated : 05/26/2021 Topical wound management initiated : 05/26/2021 Notes: Electronic Signature(s) Signed: 11/06/2021 4:05:15 PM By: Rhae Hammock RN Entered By: Rhae Hammock on 11/06/2021 11:15:05 -------------------------------------------------------------------------------- Pain Assessment Details Patient Name: Date of Service: Alex Jackson, Alex BERT W. 11/06/2021 9:30 A M Medical Record Number: [id number] Patient Account Number: [id number] Date of Birth/Sex: Treating RN: 03-18-1946 (77 y.o. [name], [name] Primary Care Asiana Benninger: London Pepper Other Clinician: Referring Mateya Torti: Treating Georgina Krist/Extender: Merilyn Baba in Treatment: 23 Active Problems Location of Pain Severity and Description of Pain Patient Has Paino Yes Site Locations Pain Location: Pain in Ulcers Duration of the Pain. Constant / Intermittento Intermittent Rate the pain. Current Pain Level: 4 Worst Pain Level: 10 Least Pain Level: 0 Tolerable Pain Level: 4 Character of Pain Describe the Pain: Aching Pain Management and Medication Current Pain Management: Medication: No Cold Application: No Rest: No Massage: No Activity: No T.E.N.S.: No Heat Application: No Leg drop or elevation: No Is the Current  Pain Management Adequate: Adequate How does your wound impact your activities of daily livingo Sleep: No Bathing: No Appetite: No Relationship With Others: No Bladder Continence: No Emotions: No Bowel Continence: No Work: No Toileting: No Drive: No Dressing: No Hobbies: No Electronic Signature(s) Signed: 11/06/2021  4:05:15 PM By: Rhae Hammock RN Entered By: Rhae Hammock on 11/06/2021 10:51:36 -------------------------------------------------------------------------------- Patient/Caregiver Education Details Patient Name: Date of Service: Alex Jackson 7/6/2023andnbsp9:30 A M Medical Record Number: [id number] Patient Account Number: [id number] Date of Birth/Gender: Treating RN: 11-29-1945 (75 y.o. [name] Primary Care Physician: London Pepper Other Clinician: Referring Physician: Treating Physician/Extender: Merilyn Baba in Treatment: 23 Education Assessment Education Provided To: Patient Education Topics Provided Nutrition: Methods: Explain/Verbal Responses: Reinforcements needed, State content correctly Electronic Signature(s) Signed: 11/06/2021 4:05:15 PM By: Rhae Hammock RN Entered By: Rhae Hammock on 11/06/2021 11:15:22 -------------------------------------------------------------------------------- Wound Assessment Details Patient Name: Date of Service: Alex Jackson, Alex BERT W. 11/06/2021 9:30 A M Medical Record Number: [id number] Patient Account Number: [id number] Date of Birth/Sex: Treating RN: [birth year] (76 y.o. [name], [name] Primary Care Jeny Nield: London Pepper Other Clinician: Referring Nadiya Pieratt: Treating Shakea Isip/Extender: Merilyn Baba in Treatment: 23 Wound Status Wound Number: 1 Primary Diabetic Wound/Ulcer of the Lower Extremity Etiology: Wound Location: Left T Great oe Wound Open Wounding Event: Blister Status: Date Acquired: 10/02/2020 Comorbid Cataracts, Coronary Artery Disease, Hypertension, Myocardial Weeks Of Treatment: 23 History: Infarction, Type II Diabetes Clustered Wound: No Photos Wound Measurements Length: (cm) 0.6 Width: (cm) 0.3 Depth: (cm) 0.2 Area: (cm) 0.141 Volume: (cm) 0.028 % Reduction in Area: 50.2% % Reduction in Volume:  75.2% Epithelialization: Medium (34-66%) Tunneling: No Undermining: Yes Starting Position (o'clock): 9 Ending Position (o'clock): 6 Maximum Distance: (cm) 0.3 Wound Description Classification: Grade 3 Wound Margin: Distinct, outline attached Exudate Amount: Medium Exudate Type: Serosanguineous Exudate Color: red, brown Foul Odor After Cleansing: No Slough/Fibrino No Wound Bed Granulation Amount: Large (67-100%) Exposed Structure Granulation Quality: Red, Pink Fascia Exposed: No Necrotic Amount: None Present (0%) Fat Layer (Subcutaneous Tissue) Exposed: Yes Tendon Exposed: No Muscle Exposed: No Joint Exposed: No Bone Exposed: No Treatment Notes Wound #1 (Toe Great) Wound Laterality: Left Cleanser Peri-Wound Care Topical topical antibiotics Discharge Instruction: [facility] topical antibiotics apply a small amount to wound. Primary Dressing Hydrofera Blue Ready Foam, 2.5 x2.5 in Discharge Instruction: Apply to wound bed as instructed Secondary Dressing Woven Gauze Sponges 2x2 in Discharge Instruction: Apply over primary dressing as directed. Secured With Conforming Stretch Gauze Bandage, Sterile 2x75 (in/in) Discharge Instruction: Secure with stretch gauze as directed. Compression Wrap Compression Stockings Add-Ons Electronic Signature(s) Signed: 11/06/2021 4:05:15 PM By: Rhae Hammock RN Signed: 11/06/2021 4:18:35 PM By: Deon Pilling RN, BSN Entered By: Deon Pilling on 11/06/2021 11:05:55 -------------------------------------------------------------------------------- Vitals Details Patient Name: Date of Service: Alex Jackson, Alex BERT W. 11/06/2021 9:30 A M Medical Record Number: [id number] Patient Account Number: [id number] Date of Birth/Sex: Treating RN: 04/24/1946 (76 y.o. [name], [name] Primary Care Jaiyah Beining: London Pepper Other Clinician: Referring Luwanna Brossman: Treating Alanta Scobey/Extender: Merilyn Baba in Treatment:  23 Vital Signs Time Taken: 10:49 Temperature (F): 98 Height (in): 71 Pulse (bpm): 72 Weight (lbs): 252 Respiratory Rate (breaths/min): 17 Body Mass Index (BMI): 35.1 Blood Pressure (mmHg): 131/84 Reference Range: 80 - 120 mg / dl Electronic Signature(s) Signed: 11/06/2021 4:05:15 PM By: Rhae Hammock RN Signed: 11/06/2021 4:05:15 PM By: Rhae Hammock RN Entered By: Rhae Hammock on 11/06/2021 10:49:56

## 2021-11-06 NOTE — Progress Notes (Signed)
Alex, Jackson ([id number]) Visit Report for 11/06/2021 Chief Complaint Document Details Patient Name: Date of Service: Alex Jackson. 11/06/2021 9:30 A M Medical Record Number: [id number] Patient Account Number: [id number] Date of Birth/Sex: Treating RN: [birth year] (76 y.o. Alex Jackson, [name] Primary Care Provider: London Jackson Other Clinician: Referring Provider: Treating Provider/Extender: Alex Jackson in Treatment: 23 Information Obtained from: Patient Chief Complaint Left great toe wound Electronic Signature(s) Signed: 11/06/2021 12:03:16 PM By: Alex Shan DO Entered By: Alex Jackson on 11/06/2021 11:58:11 -------------------------------------------------------------------------------- HPI Details Patient Name: Date of Service: Alex Jackson, Alex BERT W. 11/06/2021 9:30 A M Medical Record Number: [id number] Patient Account Number: [id number] Date of Birth/Sex: Treating RN: 05-08-1945 (76 y.o. Alex Jackson Primary Care Provider: London Jackson Other Clinician: Referring Provider: Treating Provider/Extender: Alex Jackson in Treatment: 23 History of Present Illness HPI Description: Admission 05/26/2021 Mr. Alex Jackson is a 76 year old male with a past medical history of controlled type 2 diabetes on oral agents, chronic back pain and coronary artery disease status post stenting to the LAD that presents to the clinic for a 62-month history of nonhealing wound to the left great toe plantar aspect. He has peripheral neuropathy and is not sure how the wound developed. He has been following with Dr. Doran Jackson for this issue and has been using Betadine to the area daily. He does not offload the area. He denies signs of infection. 2/3; patient presents for follow-up. He obtained his left foot x-ray. He has been using gentamicin with hydrofera blue toe to the wound bed with dressing changes. He has no issues or complaints  today. He denies signs of infection. 2/17; patient presents for follow-up. He had his MRI that showed bone marrow edema at the distal aspect concerning for osteomyelitis. Currently patient denies signs of infection. He feels well and has been using gentamicin with Hydrofera Blue to the wound bed. He states that he has ordered custom diabetic shoes. He obtains these at the end of the month. 3/3; patient presents for follow-up. He had a scheduled appointment with Dr. Megan Jackson, infectious disease yesterday. Patient canceled and at this time does not want to follow-up with any other doctors for this issue. He currently denies signs of infection. He has been using gentamicin ointment with Hydrofera Blue to the wound bed. He has orthopedic shoes and has an area that offloads the left great toe. 3/17; patient presents for follow-up. He reports stubbing his left great toe that has the wound. He has been using gentamicin ointment and Hydrofera Blue with dressing changes. He has orthopedic shoes that offload the left great toe however he is not wearing them today and he confirms that he does not wear these every day. 3/27; patient presents for follow-up. He has his surgical shoe with a cut out to the left great toe. Unfortunately his foot is shifting in the shoe and pressure continues as noted by the drainage location. He continues to use gentamicin ointment with Hydrofera Blue. He denies signs of infection. He reports less drainage to the wound site. 5/5; patient presents for follow-up. Unfortunately the patient did not follow-up as recommended. I have not seen this patient in over 4 weeks. He has no issues or complaints today. He states he is using gentamicin ointment and Hydrofera Blue however the blue dressing is not staying in place. He denies signs of systemic infection. 5/16; patient presents for follow-up. He obtained his [facility] antibiotics and has been using  this daily for the past week. He reports  improvement in wound healing. Due to financial constraints he states he would only be able to follow-up every 3 weeks. 6/15; patient presents for follow-up. He has been using [facility] antibiotic ointment daily. It is unclear if he is able to do this properly since he cannot reach his feet very easily. He has nobody to help him with dressing changes. He had an MRI of his left foot completed on 06/14/2021 that showed changes concerning for osteomyelitis to the left great toe. At that time I had referred him to infectious disease however he did not show up to his appointment and did not want to follow-up at that time. Today he states he would like to see infectious disease. We set up an appointment for him next Thursday at 55 with Dr. Megan Jackson. He currently denies signs of infection. He is not able to aggressively offload this area. He is open to doing the total contact cast. 6/23; patient presents for follow-up. He saw Dr. Megan Jackson with infectious disease for MRI findings suggesting osteomyelitis in the distal tuft of the left great toe. CRP and sed rate were normal. Patient was started on Bactrim and Augmentin. No active signs of acute infection at this time. Patient has been using [facility] antibiotics. He is open to doing the total contact cast today. 6/26; patient presents for follow-up. Patient has been taking Bactrim and Augmentin without issues. He had a total contact cast placed last Friday and presents for obligatory cast change. He reports no issues with the cast. 6/29; patient presents for follow-up. He presents for his obligatory cast exchange. He had no issues with the cast over the past couple of days. 7/6; patient presents for follow-up. He tolerated the total contact cast well. We have been using Hydrofera Blue although it appears its not staying well in place after the cast was removed. He denies signs of infection. Electronic Signature(s) Signed: 11/06/2021 12:03:16 PM By: Alex Shan DO Entered By: Alex Jackson on 11/06/2021 11:59:22 -------------------------------------------------------------------------------- Physical Exam Details Patient Name: Date of Service: Alex Alex W. 11/06/2021 9:30 A M Medical Record Number: [id number] Patient Account Number: [id number] Date of Birth/Sex: Treating RN: 1946/05/01 (76 y.o. Alex Jackson Primary Care Provider: London Jackson Other Clinician: Referring Provider: Treating Provider/Extender: Alex Jackson in Treatment: 23 Constitutional respirations regular, non-labored and within target range for patient.. Cardiovascular 2+ dorsalis pedis/posterior tibialis pulses. Psychiatric pleasant and cooperative. Notes Left great toe: T the plantar aspect there is an open wound with granulation tissue throughout. o Electronic Signature(s) Signed: 11/06/2021 12:03:16 PM By: Alex Shan DO Entered By: Alex Jackson on 11/06/2021 12:00:05 -------------------------------------------------------------------------------- Physician Orders Details Patient Name: Date of Service: Alex Jackson, Alex BERT W. 11/06/2021 9:30 A M Medical Record Number: [id number] Patient Account Number: [id number] Date of Birth/Sex: Treating RN: 11/12/1945 (76 y.o. Alex Jackson Primary Care Provider: London Jackson Other Clinician: Referring Provider: Treating Provider/Extender: Alex Jackson in Treatment: 56 Verbal / Phone Orders: No Diagnosis Coding Follow-up Appointments ppointment in 1 week. - Thursday 11/13/2021 @ 1430 w/ Dr. Heber Bethel and Allayne Butcher Room # 9 Return A Bathing/ Shower/ Hygiene May shower and wash wound with soap and water. - with dressing changes. Edema Control - Lymphedema / SCD / Other Elevate legs to the level of the heart or above for 30 minutes daily and/or when sitting, a frequency of: - 3-4 times a day throughout the day. Avoid standing for  long periods  of time. Moisturize legs daily. - every night before bed. Off-Loading Total Contact Cast to Left Lower Extremity Wound Treatment Wound #1 - T Great oe Wound Laterality: Left Topical: topical antibiotics 1 x Per Week [facility]charge Instructions: [facility] apply a small amount to wound. Prim Dressing: KerraCel Ag Gelling Fiber Dressing, 4x5 in (silver alginate) 1 x Per Week ary Discharge Instructions: Apply silver alginate to wound bed as instructed Secondary Dressing: Woven Gauze Sponges 2x2 in 1 x Per Week Discharge Instructions: Apply over primary dressing as directed. Secured With: Insurance underwriter, Sterile 2x75 (in/in) 1 x Per Week Discharge Instructions: Secure with stretch gauze as directed. Electronic Signature(s) Signed: 11/06/2021 12:03:16 PM By: Geralyn Corwin DO Entered By: Geralyn Corwin on 11/06/2021 12:00:15 -------------------------------------------------------------------------------- Problem List Details Patient Name: Date of Service: Alex Jackson, Alex BERT W. 11/06/2021 9:30 A M Medical Record Number: [id number] Patient Account Number: [id number] Date of Birth/Sex: Treating RN: 1945-05-25 (76 y.o. [name], [name] Primary Care Provider: [name] Other Clinician: Referring Provider: Treating Provider/Extender: Zena Amos in Treatment: 23 Active Problems ICD-10 Encounter Code Description Active Date MDM Diagnosis [id number] Non-pressure chronic ulcer of other part of left foot with fat layer exposed 05/26/2021 No Yes E11.621 Type 2 diabetes mellitus with foot ulcer 05/26/2021 No Yes Z95.5 Presence of coronary angioplasty implant and graft 05/26/2021 No Yes E78.5 Hyperlipidemia, unspecified 05/26/2021 No Yes M54.50 Low back pain, unspecified 05/26/2021 No Yes Inactive Problems Resolved Problems Electronic Signature(s) Signed: 11/06/2021 12:03:16 PM By: Geralyn Corwin DO Entered By: Geralyn Corwin on 11/06/2021 11:57:25 -------------------------------------------------------------------------------- Progress Note Details Patient Name: Date of Service: Alex Jackson, Alex BERT W. 11/06/2021 9:30 A M Medical Record Number: [id number] Patient Account Number: [id number] Date of Birth/Sex: Treating RN: Feb 24, 1946 (76 y.o. [name], [name] Primary Care Provider: [name] Other Clinician: Referring Provider: Treating Provider/Extender: Zena Amos in Treatment: 23 Subjective Chief Complaint Information obtained from Patient Left great toe wound History of Present Illness (HPI) Admission 05/26/2021 Mr. Tykel Badie is a 76 year old male with a past medical history of controlled type 2 diabetes on oral agents, chronic back pain and coronary artery disease status post stenting to the LAD that presents to the clinic for a 92-month history of nonhealing wound to the left great toe plantar aspect. He has peripheral neuropathy and is not sure how the wound developed. He has been following with Dr. Victorino Dike for this issue and has been using Betadine to the area daily. He does not offload the area. He denies signs of infection. 2/3; patient presents for follow-up. He obtained his left foot x-ray. He has been using gentamicin with hydrofera blue toe to the wound bed with dressing changes. He has no issues or complaints today. He denies signs of infection. 2/17; patient presents for follow-up. He had his MRI that showed bone marrow edema at the distal aspect concerning for osteomyelitis. Currently patient denies signs of infection. He feels well and has been using gentamicin with Hydrofera Blue to the wound bed. He states that he has ordered custom diabetic shoes. He obtains these at the end of the month. 3/3; patient presents for follow-up. He had a scheduled appointment with Dr. Orvan Falconer, infectious disease yesterday. Patient canceled and at this time does not want  to follow-up with any other doctors for this issue. He currently denies signs of infection. He has been using gentamicin ointment with Hydrofera Blue to the wound bed. He has  orthopedic shoes and has an area that offloads the left great toe. 3/17; patient presents for follow-up. He reports stubbing his left great toe that has the wound. He has been using gentamicin ointment and Hydrofera Blue with dressing changes. He has orthopedic shoes that offload the left great toe however he is not wearing them today and he confirms that he does not wear these every day. 3/27; patient presents for follow-up. He has his surgical shoe with a cut out to the left great toe. Unfortunately his foot is shifting in the shoe and pressure continues as noted by the drainage location. He continues to use gentamicin ointment with Hydrofera Blue. He denies signs of infection. He reports less drainage to the wound site. 5/5; patient presents for follow-up. Unfortunately the patient did not follow-up as recommended. I have not seen this patient in over 4 weeks. He has no issues or complaints today. He states he is using gentamicin ointment and Hydrofera Blue however the blue dressing is not staying in place. He denies signs of systemic infection. 5/16; patient presents for follow-up. He obtained his [facility] antibiotics and has been using this daily for the past week. He reports improvement in wound healing. Due to financial constraints he states he would only be able to follow-up every 3 weeks. 6/15; patient presents for follow-up. He has been using [facility] antibiotic ointment daily. It is unclear if he is able to do this properly since he cannot reach his feet very easily. He has nobody to help him with dressing changes. He had an MRI of his left foot completed on 06/14/2021 that showed changes concerning for osteomyelitis to the left great toe. At that time I had referred him to infectious disease however he did not show up  to his appointment and did not want to follow-up at that time. Today he states he would like to see infectious disease. We set up an appointment for him next Thursday at 31 with Dr. Megan Jackson. He currently denies signs of infection. He is not able to aggressively offload this area. He is open to doing the total contact cast. 6/23; patient presents for follow-up. He saw Dr. Megan Jackson with infectious disease for MRI findings suggesting osteomyelitis in the distal tuft of the left great toe. CRP and sed rate were normal. Patient was started on Bactrim and Augmentin. No active signs of acute infection at this time. Patient has been using [facility] antibiotics. He is open to doing the total contact cast today. 6/26; patient presents for follow-up. Patient has been taking Bactrim and Augmentin without issues. He had a total contact cast placed last Friday and presents for obligatory cast change. He reports no issues with the cast. 6/29; patient presents for follow-up. He presents for his obligatory cast exchange. He had no issues with the cast over the past couple of days. 7/6; patient presents for follow-up. He tolerated the total contact cast well. We have been using Hydrofera Blue although it appears its not staying well in place after the cast was removed. He denies signs of infection. Patient History Information obtained from Patient. Family History Cancer - Father,Mother, Hypertension - Mother, No family history of Diabetes, Heart Disease, Hereditary Spherocytosis, Kidney Disease, Lung Disease, Seizures, Stroke, Thyroid Problems, Tuberculosis. Social History Former smoker - quit 1985, Alcohol Use - Daily - wine or scotch, Drug Use - No History, Caffeine Use - Never. Medical History Eyes Patient has history of Cataracts - removed Denies history of Glaucoma, Optic Neuritis Ear/Nose/Mouth/Throat Denies history  of Chronic sinus problems/congestion, Middle ear problems Hematologic/Lymphatic Denies  history of Hemophilia, Human Immunodeficiency Virus, Lymphedema, Sickle Cell Disease Respiratory Denies history of Aspiration, Asthma, Chronic Obstructive Pulmonary Disease (COPD), Pneumothorax, Sleep Apnea, Tuberculosis Cardiovascular Patient has history of Coronary Artery Disease, Hypertension, Myocardial Infarction - NSTEMI Denies history of Angina, Arrhythmia, Congestive Heart Failure, Deep Vein Thrombosis, Hypotension, Peripheral Arterial Disease, Peripheral Venous Disease, Phlebitis, Vasculitis Gastrointestinal Denies history of Cirrhosis , Colitis, Crohnoos, Hepatitis A, Hepatitis B, Hepatitis C Endocrine Patient has history of Type II Diabetes - PCP notes type II; per patient takes metformin been told prediabetic Denies history of Type I Diabetes Genitourinary Denies history of End Stage Renal Disease Immunological Denies history of Lupus Erythematosus, Raynaudoos, Scleroderma Integumentary (Skin) Denies history of History of Burn Musculoskeletal Denies history of Gout, Rheumatoid Arthritis, Osteoarthritis, Osteomyelitis Neurologic Denies history of Dementia, Neuropathy, Quadriplegia, Paraplegia, Seizure Disorder Oncologic Denies history of Received Chemotherapy, Received Radiation Psychiatric Denies history of Anorexia/bulimia, Confinement Anxiety Hospitalization/Surgery History - stents MRI 2017and2019. - torn meniscus left 5 weeks ago. Medical A Surgical History Notes nd Constitutional Symptoms (General Health) Back pain x6 months Cardiovascular hyperlipidemia Genitourinary Elevated PSA Objective Constitutional respirations regular, non-labored and within target range for patient.. Vitals Time Taken: 10:49 AM, Height: 71 in, Weight: 252 lbs, BMI: 35.1, Temperature: 98 F, Pulse: 72 bpm, Respiratory Rate: 17 breaths/min, Blood Pressure: 131/84 mmHg. Cardiovascular 2+ dorsalis pedis/posterior tibialis pulses. Psychiatric pleasant and cooperative. General Notes:  Left great toe: T the plantar aspect there is an open wound with granulation tissue throughout. o Integumentary (Hair, Skin) Wound #1 status is Open. Original cause of wound was Blister. The date acquired was: 10/02/2020. The wound has been in treatment 23 weeks. The wound is located on the Left T Great. The wound measures 0.6cm length x 0.3cm width x 0.2cm depth; 0.141cm^2 area and 0.028cm^3 volume. There is Fat Layer oe (Subcutaneous Tissue) exposed. There is no tunneling noted, however, there is undermining starting at 9:00 and ending at 6:00 with a maximum distance of 0.3cm. There is a medium amount of serosanguineous drainage noted. The wound margin is distinct with the outline attached to the wound base. There is large (67-100%) red, pink granulation within the wound bed. There is no necrotic tissue within the wound bed. Assessment Active Problems ICD-10 Non-pressure chronic ulcer of other part of left foot with fat layer exposed Type 2 diabetes mellitus with foot ulcer Presence of coronary angioplasty implant and graft Hyperlipidemia, unspecified Low back pain, unspecified Patient's wound has shown improvement in size in appearance since last clinic visit. No need for debridement today. Since the [facility] is not staying in place very well I recommended switching to silver alginate but also continuing with the [facility] antibiotics. T contact cast was placed in standard otal fashion today. Procedures Wound #1 Pre-procedure diagnosis of Wound #1 is a Diabetic Wound/Ulcer of the Lower Extremity located on the Left T Great . There was a T Contact Cast oe otal Procedure by Alex Shan, DO. Post procedure Diagnosis Wound #1: Same as Pre-Procedure Plan Follow-up Appointments: Return Appointment in 1 week. - Thursday 11/13/2021 @ 1430 w/ Dr. Heber Stella and Allayne Butcher Room # 9 Bathing/ Shower/ Hygiene: May shower and wash wound with soap and water. - with dressing changes. Edema  Control - Lymphedema / SCD / Other: Elevate legs to the level of the heart or above for 30 minutes daily and/or when sitting, a frequency of: - 3-4 times a day throughout the day. Avoid standing for  long periods of time. Moisturize legs daily. - every night before bed. Off-Loading: T Contact Cast to Left Lower Extremity otal WOUND #1: - T Great Wound Laterality: Left oe Topical: topical antibiotics 1 x Per Week/ Discharge Instructions: [facility] apply a small amount to wound. Prim Dressing: KerraCel Ag Gelling Fiber Dressing, 4x5 in (silver alginate) 1 x Per Week/ ary Discharge Instructions: Apply silver alginate to wound bed as instructed Secondary Dressing: Woven Gauze Sponges 2x2 in 1 x Per Week/ Discharge Instructions: Apply over primary dressing as directed. Secured With: Insurance underwriter, Sterile 2x75 (in/in) 1 x Per Week/ Discharge Instructions: Secure with stretch gauze as directed. 1. TCC placed in standard fashion 2. Silver alginate with [facility] antibiotic 3. Follow-up in 1 week Electronic Signature(s) Signed: 11/06/2021 12:03:16 PM By: Geralyn Corwin DO Entered By: Geralyn Corwin on 11/06/2021 12:02:15 -------------------------------------------------------------------------------- HxROS Details Patient Name: Date of Service: Alex Jackson, Alex BERT W. 11/06/2021 9:30 A M Medical Record Number: [id number] Patient Account Number: [id number] Date of Birth/Sex: Treating RN: May 06, 1945 (76 y.o. [name], [name] Primary Care Provider: [name] Other Clinician: Referring Provider: Treating Provider/Extender: Zena Amos in Treatment: 23 Information Obtained From Patient Constitutional Symptoms (General Health) Medical History: Past Medical History Notes: Back pain x6 months Eyes Medical History: Positive for: Cataracts - removed Negative for: Glaucoma; Optic  Neuritis Ear/Nose/Mouth/Throat Medical History: Negative for: Chronic sinus problems/congestion; Middle ear problems Hematologic/Lymphatic Medical History: Negative for: Hemophilia; Human Immunodeficiency Virus; Lymphedema; Sickle Cell Disease Respiratory Medical History: Negative for: Aspiration; Asthma; Chronic Obstructive Pulmonary Disease (COPD); Pneumothorax; Sleep Apnea; Tuberculosis Cardiovascular Medical History: Positive for: Coronary Artery Disease; Hypertension; Myocardial Infarction - NSTEMI Negative for: Angina; Arrhythmia; Congestive Heart Failure; Deep Vein Thrombosis; Hypotension; Peripheral Arterial Disease; Peripheral Venous Disease; Phlebitis; Vasculitis Past Medical History Notes: hyperlipidemia Gastrointestinal Medical History: Negative for: Cirrhosis ; Colitis; Crohns; Hepatitis A; Hepatitis B; Hepatitis C Endocrine Medical History: Positive for: Type II Diabetes - PCP notes type II; per patient takes metformin been told prediabetic Negative for: Type I Diabetes Time with diabetes: prediabetic 3-4 years per pt Treated with: Oral agents, Diet Blood sugar tested every day: No Genitourinary Medical History: Negative for: End Stage Renal Disease Past Medical History Notes: Elevated PSA Immunological Medical History: Negative for: Lupus Erythematosus; Raynauds; Scleroderma Integumentary (Skin) Medical History: Negative for: History of Burn Musculoskeletal Medical History: Negative for: Gout; Rheumatoid Arthritis; Osteoarthritis; Osteomyelitis Neurologic Medical History: Negative for: Dementia; Neuropathy; Quadriplegia; Paraplegia; Seizure Disorder Oncologic Medical History: Negative for: Received Chemotherapy; Received Radiation Psychiatric Medical History: Negative for: Anorexia/bulimia; Confinement Anxiety HBO Extended History Items Eyes: Cataracts Immunizations Pneumococcal Vaccine: Received Pneumococcal Vaccination: Yes Received  Pneumococcal Vaccination On or After 60th Birthday: Yes Implantable Devices No devices added Hospitalization / Surgery History Type of Hospitalization/Surgery stents MRI 2017and2019 torn meniscus left 5 weeks ago Family and Social History Cancer: Yes - Father,Mother; Diabetes: No; Heart Disease: No; Hereditary Spherocytosis: No; Hypertension: Yes - Mother; Kidney Disease: No; Lung Disease: No; Seizures: No; Stroke: No; Thyroid Problems: No; Tuberculosis: No; Former smoker - quit 1985; Alcohol Use: Daily - wine or scotch; Drug Use: No History; Caffeine Use: Never; Financial Concerns: No; Food, Clothing or Shelter Needs: No; Support System Lacking: No; Transportation Concerns: No Electronic Signature(s) Signed: 11/06/2021 12:03:16 PM By: Geralyn Corwin DO Signed: 11/06/2021 4:05:15 PM By: Fonnie Mu RN Entered By: Geralyn Corwin on 11/06/2021 11:59:46 -------------------------------------------------------------------------------- Total Contact Cast Details Patient Name: Date of Service: Alex Jackson, Alex BERT W. 11/06/2021  9:30 A M Medical Record Number: [id number] Patient Account Number: [id number] Date of Birth/Sex: Treating RN: 08-23-1945 (76 y.o. Alex Jackson, [name] Primary Care Provider: London Jackson Other Clinician: Referring Provider: Treating Provider/Extender: Alex Jackson in Treatment: 23 T Contact Cast Applied for Wound Assessment: otal Wound #1 Left T Great oe Performed By: Physician Alex Shan, DO Post Procedure Diagnosis Same as Pre-procedure Electronic Signature(s) Signed: 11/06/2021 12:03:16 PM By: Alex Shan DO Signed: 11/06/2021 4:05:15 PM By: Rhae Hammock RN Entered By: Rhae Hammock on 11/06/2021 11:11:22 -------------------------------------------------------------------------------- SuperBill Details Patient Name: Date of Service: Alex Jackson, Alex BERT W. 11/06/2021 Medical Record Number: [id number] Patient  Account Number: [id number] Date of Birth/Sex: Treating RN: [birth year] (76 y.o. Alex Jackson, [name] Primary Care Provider: London Jackson Other Clinician: Referring Provider: Treating Provider/Extender: Alex Jackson in Treatment: 23 Diagnosis Coding ICD-10 Codes Code Description [phone] Non-pressure chronic ulcer of other part of left foot with fat layer exposed E11.621 Type 2 diabetes mellitus with foot ulcer Z95.5 Presence of coronary angioplasty implant and graft E78.5 Hyperlipidemia, unspecified M54.50 Low back pain, unspecified Facility Procedures CPT4 Code: [id number] Description: 29445 - APPLY TOTAL CONTACT LEG CAST ICD-10 Diagnosis Description L97.522 Non-pressure chronic ulcer of other part of left foot with fat layer exposed E11.621 Type 2 diabetes mellitus with foot ulcer Modifier: Quantity: 1 Physician Procedures : CPT4 Code Description Modifier [id number] - WC PHYS APPLY TOTAL CONTACT CAST ICD-10 Diagnosis Description L97.522 Non-pressure chronic ulcer of other part of left foot with fat layer exposed E11.621 Type 2 diabetes mellitus with foot ulcer Quantity: 1 Electronic Signature(s) Signed: 11/06/2021 12:03:16 PM By: Alex Shan DO Entered By: Alex Jackson on 11/06/2021 12:02:24

## 2021-11-07 ENCOUNTER — Encounter (HOSPITAL_BASED_OUTPATIENT_CLINIC_OR_DEPARTMENT_OTHER): Payer: PPO | Admitting: Internal Medicine

## 2021-11-11 ENCOUNTER — Telehealth: Payer: Self-pay | Admitting: Pharmacist

## 2021-11-11 NOTE — Telephone Encounter (Signed)
Patient called stating he has felt nauseous with vomiting this week after starting antibiotics ~2 weeks ago. States he started feeling bad ~8 hours after injecting his weekly Ozempic yesterday. He has been eating yogurt and probiotic gummies which seemed to relieve some of his symptoms today. Has tried Tums without relief. Already taking pantoprazole once daily. Confirmed he is taking the Augmentin and Bactrim with food twice daily (breakfast and dinner). Offered anti-emetic which he politely declined at this time.   Patient initially opposed to continuing antibiotics and wanted to only take "half" the doses. Explained that it is important to take the prescribed dosages to prevent resistance. He agreed to continue taking the antibiotics through the rest of this week and see how he is feeling. Scheduled for follow-up with Dr. Orvan Falconer next Thursday. If any further concerns, please advise.  Thanks,  Margarite Gouge, PharmD, CPP, BCIDP Clinical Pharmacist Practitioner Infectious Diseases Clinical Pharmacist [facility] for Infectious Disease

## 2021-11-12 ENCOUNTER — Telehealth: Payer: Self-pay | Admitting: Cardiology

## 2021-11-12 NOTE — Telephone Encounter (Signed)
Pt c/o medication issue:  1. Name of Medication: Semaglutide,0.25 or 0.5MG /DOS, (OZEMPIC, 0.25 OR 0.5 MG/DOSE,) 2 MG/1.5ML SOPN  2. How are you currently taking this medication (dosage and times per day)? As prescribed  3. Are you having a reaction (difficulty breathing--STAT)? Yes  4. What is your medication issue? Increase in medication is causing him to vomit and have diarrhea. Requesting a callback from Flora to discuss decreasing medication back to original dosage.

## 2021-11-12 NOTE — Telephone Encounter (Signed)
Spoke with patient. Tolerated Ozempic 0.25mg  well but took doses of 0.5 and vomited afterwards both times.  Reports he lost weight on 0.25mg  and felt well and would like to remain on 0.25.  Advised that was fine. Does not need refill at this time.

## 2021-11-13 ENCOUNTER — Encounter (HOSPITAL_BASED_OUTPATIENT_CLINIC_OR_DEPARTMENT_OTHER): Payer: PPO | Admitting: Internal Medicine

## 2021-11-13 DIAGNOSIS — M25651 Stiffness of right hip, not elsewhere classified: Secondary | ICD-10-CM | POA: Diagnosis not present

## 2021-11-13 DIAGNOSIS — R262 Difficulty in walking, not elsewhere classified: Secondary | ICD-10-CM | POA: Diagnosis not present

## 2021-11-13 DIAGNOSIS — M25652 Stiffness of left hip, not elsewhere classified: Secondary | ICD-10-CM | POA: Diagnosis not present

## 2021-11-13 DIAGNOSIS — L97522 Non-pressure chronic ulcer of other part of left foot with fat layer exposed: Secondary | ICD-10-CM | POA: Diagnosis not present

## 2021-11-13 DIAGNOSIS — M5117 Intervertebral disc disorders with radiculopathy, lumbosacral region: Secondary | ICD-10-CM | POA: Diagnosis not present

## 2021-11-14 NOTE — Progress Notes (Signed)
BJORN, HALLAS ([id number]) Visit Report for 11/13/2021 Arrival Information Details Patient Name: Date of Service: Alex Jackson. 11/13/2021 2:30 PM Medical Record Number: [id number] Patient Account Number: [id number] Date of Birth/Sex: Treating RN: 1946/01/14 (76 y.o. [name], [name] Primary Care TRUE Shackleford: London Pepper Other Clinician: Referring Christean Silvestri: Treating Digby Groeneveld/Extender: Merilyn Baba in Treatment: 24 Visit Information History Since Last Visit Added or deleted any medications: No Patient Arrived: [name] Any new allergies or adverse reactions: No Arrival Time: 15:02 Had a fall or experienced change in No Accompanied By: self activities of daily living that may affect Transfer Assistance: Manual risk of falls: Patient Identification Verified: Yes Signs or symptoms of abuse/neglect since last visito No Secondary Verification Process Completed: Yes Hospitalized since last visit: No Patient Requires Transmission-Based Precautions: No Implantable device outside of the clinic excluding No Patient Has Alerts: Yes cellular tissue based products placed in the center Patient Alerts: 11/18/20 ABI L1.04 since last visit: 11/18/20 TBI L 1.35 Has Dressing in Place as Prescribed: Yes Has Footwear/Offloading in Place as Prescribed: Yes Left: T Contact Cast otal Pain Present Now: No Electronic Signature(s) Signed: 11/14/2021 11:57:18 AM By: Rhae Hammock RN Entered By: Rhae Hammock on 11/13/2021 15:02:45 -------------------------------------------------------------------------------- Lower Extremity Assessment Details Patient Name: Date of Service: Alex Jackson, [name] 11/13/2021 2:30 PM Medical Record Number: [id number] Patient Account Number: [id number] Date of Birth/Sex: Treating RN: 02-10-1946 (76 y.o. [name], [name] Primary Care Bailyn Spackman: London Pepper Other Clinician: Referring Breunna Nordmann: Treating Corine Solorio/Extender:  Merilyn Baba in Treatment: 24 Edema Assessment Assessed: Shirlyn Goltz: Yes] Patrice Paradise: No] Edema: [Left: N] [Right: o] Calf Left: Right: Point of Measurement: 36 cm From Medial Instep 38 cm Ankle Left: Right: Point of Measurement: 12 cm From Medial Instep 22.7 cm Vascular Assessment Pulses: Dorsalis Pedis Palpable: [Left:Yes] Posterior Tibial Palpable: [Left:Yes] Electronic Signature(s) Signed: 11/14/2021 11:57:18 AM By: Rhae Hammock RN Entered By: Rhae Hammock on 11/13/2021 15:16:25 -------------------------------------------------------------------------------- Multi Wound Chart Details Patient Name: Date of Service: Alex Jackson, [name] 11/13/2021 2:30 PM Medical Record Number: [id number] Patient Account Number: [id number] Date of Birth/Sex: Treating RN: 01-10-1946 (76 y.o. [name], [name] Primary Care Alaiya Martindelcampo: London Pepper Other Clinician: Referring Simora Dingee: Treating Jovonna Nickell/Extender: Merilyn Baba in Treatment: 24 Vital Signs Height(in): 11 Pulse(bpm): 61 Weight(lbs): 45 Blood Pressure(mmHg): 106/57 Body Mass Index(BMI): 35.1 Temperature(F): 98.7 Respiratory Rate(breaths/min): 17 Photos: [N/A:N/A] Left T Great oe N/A N/A Wound Location: Blister N/A N/A Wounding Event: Diabetic Wound/Ulcer of the Lower N/A N/A Primary Etiology: Extremity Cataracts, Coronary Artery Disease, N/A N/A Comorbid History: Hypertension, Myocardial Infarction, Type II Diabetes 10/02/2020 N/A N/A Date Acquired: 24 N/A N/A Weeks of Treatment: Open N/A N/A Wound Status: No N/A N/A Wound Recurrence: 0.5x0.3x0.4 N/A N/A Measurements L x W x D (cm) 0.118 N/A N/A A (cm) : rea 0.047 N/A N/A Volume (cm) : 58.30% N/A N/A % Reduction in A rea: 58.40% N/A N/A % Reduction in Volume: Grade 3 N/A N/A Classification: Medium N/A N/A Exudate A mount: Serosanguineous N/A N/A Exudate Type: red, brown N/A N/A Exudate  Color: Distinct, outline attached N/A N/A Wound Margin: Large (67-100%) N/A N/A Granulation A mount: Red, Pink N/A N/A Granulation Quality: None Present (0%) N/A N/A Necrotic A mount: Fat Layer (Subcutaneous Tissue): Yes N/A N/A Exposed Structures: Fascia: No Tendon: No Muscle: No Joint: No Bone: No Medium (34-66%) N/A N/A Epithelialization: Debridement - Excisional N/A N/A Debridement: Pre-procedure Verification/Time Out 15:27 N/A N/A Taken: Lidocaine  N/A N/A Pain Control: Subcutaneous, Slough N/A N/A Tissue Debrided: Skin/Subcutaneous Tissue N/A N/A Level: 0.15 N/A N/A Debridement A (sq cm): rea Curette N/A N/A Instrument: Minimum N/A N/A Bleeding: Pressure N/A N/A Hemostasis A chieved: 0 N/A N/A Procedural Pain: 0 N/A N/A Post Procedural Pain: Procedure was tolerated well N/A N/A Debridement Treatment Response: 0.5x0.3x0.4 N/A N/A Post Debridement Measurements L x W x D (cm) 0.047 N/A N/A Post Debridement Volume: (cm) Debridement N/A N/A Procedures Performed: T Contact Cast otal Treatment Notes Electronic Signature(s) Signed: 11/13/2021 4:21:31 PM By: Kalman Shan DO Signed: 11/14/2021 11:57:18 AM By: Rhae Hammock RN Entered By: Kalman Shan on 11/13/2021 16:04:43 -------------------------------------------------------------------------------- Multi-Disciplinary Care Plan Details Patient Name: Date of Service: Alex Jackson, [name] 11/13/2021 2:30 PM Medical Record Number: [id number] Patient Account Number: [id number] Date of Birth/Sex: Treating RN: 1945/07/02 (76 y.o. [name], [name] Primary Care Stancil Deisher: London Pepper Other Clinician: Referring Caswell Alvillar: Treating Beckey Polkowski/Extender: Merilyn Baba in Treatment: 24 Multidisciplinary Care Plan reviewed with physician Active Inactive Nutrition Nursing Diagnoses: Potential for alteratiion in Nutrition/Potential for imbalanced  nutrition Goals: Patient/caregiver agrees to and verbalizes understanding of need to obtain nutritional consultation Date Initiated: 05/26/2021 Date Inactivated: 06/06/2021 Target Resolution Date: 06/06/2021 Goal Status: Met Patient/caregiver will maintain therapeutic glucose control Date Initiated: 06/06/2021 Target Resolution Date: 11/28/2021 Goal Status: Active Interventions: Assess patient nutrition upon admission and as needed per policy Provide education on nutrition Treatment Activities: Education provided on Nutrition : 11/06/2021 Obtain HgA1c : 05/26/2021 Patient referred to Primary Care Physician for further nutritional evaluation : 05/26/2021 Notes: Wound/Skin Impairment Nursing Diagnoses: Knowledge deficit related to ulceration/compromised skin integrity Goals: Patient/caregiver will verbalize understanding of skin care regimen Date Initiated: 05/26/2021 Target Resolution Date: 11/28/2021 Goal Status: Active Ulcer/skin breakdown will have a volume reduction of 50% by week 8 Date Initiated: 06/06/2021 Target Resolution Date: 11/28/2021 Goal Status: Active Interventions: Assess patient/caregiver ability to perform ulcer/skin care regimen upon admission and as needed Assess ulceration(s) every visit Provide education on ulcer and skin care Treatment Activities: Skin care regimen initiated : 05/26/2021 Topical wound management initiated : 05/26/2021 Notes: Electronic Signature(s) Signed: 11/14/2021 11:57:18 AM By: Rhae Hammock RN Entered By: Rhae Hammock on 11/13/2021 15:25:55 -------------------------------------------------------------------------------- Pain Assessment Details Patient Name: Date of Service: Alex Jackson, [name] 11/13/2021 2:30 PM Medical Record Number: [id number] Patient Account Number: [id number] Date of Birth/Sex: Treating RN: [birth year] (76 y.o. [name], [name] Primary Care Stephenson Cichy: London Pepper Other Clinician: Referring  Cobey Raineri: Treating Konrad Hoak/Extender: Merilyn Baba in Treatment: 24 Active Problems Location of Pain Severity and Description of Pain Patient Has Paino No Site Locations Pain Management and Medication Current Pain Management: Electronic Signature(s) Signed: 11/14/2021 11:57:18 AM By: Rhae Hammock RN Entered By: Rhae Hammock on 11/13/2021 15:03:10 -------------------------------------------------------------------------------- Patient/Caregiver Education Details Patient Name: Date of Service: Alex Jackson 7/13/2023andnbsp2:30 PM Medical Record Number: [id number] Patient Account Number: [id number] Date of Birth/Gender: Treating RN: [birth year] (76 y.o. [name] Primary Care Physician: London Pepper Other Clinician: Referring Physician: Treating Physician/Extender: Merilyn Baba in Treatment: 24 Education Assessment Education Provided To: Patient Education Topics Provided Nutrition: Methods: Explain/Verbal Responses: State content correctly Electronic Signature(s) Signed: 11/14/2021 11:57:18 AM By: Rhae Hammock RN Entered By: Rhae Hammock on 11/13/2021 15:26:07 -------------------------------------------------------------------------------- Wound Assessment Details Patient Name: Date of Service: Alex Jackson, [name] 11/13/2021 2:30 PM Medical Record Number: [id number] Patient Account Number: [id number] Date of Birth/Sex: Treating RN: 11-15-1945 (76 y.o. M)  Rhae Hammock Primary Care Deara Bober: London Pepper Other Clinician: Referring Yuvia Plant: Treating Creedence Heiss/Extender: Merilyn Baba in Treatment: 24 Wound Status Wound Number: 1 Primary Diabetic Wound/Ulcer of the Lower Extremity Etiology: Wound Location: Left T Great oe Wound Open Wounding Event: Blister Status: Date Acquired: 10/02/2020 Comorbid Cataracts, Coronary Artery Disease, Hypertension,  Myocardial Weeks Of Treatment: 24 History: Infarction, Type II Diabetes Clustered Wound: No Photos Wound Measurements Length: (cm) 0.5 Width: (cm) 0.3 Depth: (cm) 0.4 Area: (cm) 0.118 Volume: (cm) 0.047 % Reduction in Area: 58.3% % Reduction in Volume: 58.4% Epithelialization: Medium (34-66%) Wound Description Classification: Grade 3 Wound Margin: Distinct, outline attached Exudate Amount: Medium Exudate Type: Serosanguineous Exudate Color: red, brown Foul Odor After Cleansing: No Slough/Fibrino No Wound Bed Granulation Amount: Large (67-100%) Exposed Structure Granulation Quality: Red, Pink Fascia Exposed: No Necrotic Amount: None Present (0%) Fat Layer (Subcutaneous Tissue) Exposed: Yes Tendon Exposed: No Muscle Exposed: No Joint Exposed: No Bone Exposed: No Electronic Signature(s) Signed: 11/14/2021 11:57:18 AM By: Rhae Hammock RN Entered By: Rhae Hammock on 11/13/2021 15:20:14 -------------------------------------------------------------------------------- Vitals Details Patient Name: Date of Service: Alex Jackson, [name] 11/13/2021 2:30 PM Medical Record Number: [id number] Patient Account Number: [id number] Date of Birth/Sex: Treating RN: 1945-11-25 (76 y.o. [name], [name] Primary Care Susan Bleich: London Pepper Other Clinician: Referring Roseanne Juenger: Treating Jenille Laszlo/Extender: Merilyn Baba in Treatment: 24 Vital Signs Time Taken: 15:02 Temperature (F): 98.7 Height (in): 71 Pulse (bpm): 74 Weight (lbs): 252 Respiratory Rate (breaths/min): 17 Body Mass Index (BMI): 35.1 Blood Pressure (mmHg): 106/57 Reference Range: 80 - 120 mg / dl Electronic Signature(s) Signed: 11/14/2021 11:57:18 AM By: Rhae Hammock RN Entered By: Rhae Hammock on 11/13/2021 15:03:04

## 2021-11-14 NOTE — Progress Notes (Signed)
Alex Jackson ([id number]) Visit Report for 11/13/2021 Chief Complaint Document Details Patient Name: Date of Service: Alex Jackson. 11/13/2021 2:30 PM Medical Record Number: [id number] Patient Account Number: [id number] Date of Birth/Sex: Treating RN: 10/14/1945 (76 y.o. Alex Jackson, Alex Jackson Primary Care Provider: London Jackson Other Clinician: Referring Provider: Treating Provider/Extender: Alex Jackson in Treatment: 24 Information Obtained from: Patient Chief Complaint Left great toe wound Electronic Signature(s) Signed: 11/13/2021 4:21:31 PM By: Alex Shan DO Entered By: Alex Jackson on 11/13/2021 16:04:52 -------------------------------------------------------------------------------- Debridement Details Patient Name: Date of Service: Alex Jackson, Alex BERT W. 11/13/2021 2:30 PM Medical Record Number: [id number] Patient Account Number: [id number] Date of Birth/Sex: Treating RN: Dec 24, 1945 (76 y.o. Alex Jackson, Alex Jackson Primary Care Provider: London Jackson Other Clinician: Referring Provider: Treating Provider/Extender: Alex Jackson in Treatment: 24 Debridement Performed for Assessment: Wound #1 Left T Great oe Performed By: Physician Alex Shan, DO Debridement Type: Debridement Severity of Tissue Pre Debridement: Fat layer exposed Level of Consciousness (Pre-procedure): Awake and Alert Pre-procedure Verification/Time Out Yes - 15:27 Taken: Start Time: 15:27 Pain Control: Lidocaine T Area Debrided (L x W): otal 0.5 (cm) x 0.3 (cm) = 0.15 (cm) Tissue and other material debrided: Viable, Non-Viable, Slough, Subcutaneous, Skin: Dermis , Skin: Epidermis, Slough Level: Skin/Subcutaneous Tissue Debridement Description: Excisional Instrument: Curette Bleeding: Minimum Hemostasis Achieved: Pressure End Time: 15:27 Procedural Pain: 0 Post Procedural Pain: 0 Response to Treatment: Procedure was tolerated  well Level of Consciousness (Post- Awake and Alert procedure): Post Debridement Measurements of Total Wound Length: (cm) 0.5 Width: (cm) 0.3 Depth: (cm) 0.4 Volume: (cm) 0.047 Character of Wound/Ulcer Post Debridement: Improved Severity of Tissue Post Debridement: Fat layer exposed Post Procedure Diagnosis Same as Pre-procedure Electronic Signature(s) Signed: 11/13/2021 4:21:31 PM By: Alex Shan DO Signed: 11/14/2021 11:57:18 AM By: Alex Hammock RN Entered By: Alex Jackson on 11/13/2021 15:28:44 -------------------------------------------------------------------------------- HPI Details Patient Name: Date of Service: Alex Jackson, Alex BERT W. 11/13/2021 2:30 PM Medical Record Number: [id number] Patient Account Number: [id number] Date of Birth/Sex: Treating RN: [birth year] (76 y.o. Alex Jackson Primary Care Provider: London Jackson Other Clinician: Referring Provider: Treating Provider/Extender: Alex Jackson in Treatment: 24 History of Present Illness HPI Description: Admission 05/26/2021 Mr. Alex Jackson is a 76 year old male with a past medical history of controlled type 2 diabetes on oral agents, chronic back pain and coronary artery disease status post stenting to the LAD that presents to the clinic for a 69-month history of nonhealing wound to the left great toe plantar aspect. He has peripheral neuropathy and is not sure how the wound developed. He has been following with Alex Jackson for this issue and has been using Betadine to the area daily. He does not offload the area. He denies signs of infection. 2/3; patient presents for follow-up. He obtained his left foot x-ray. He has been using gentamicin with hydrofera blue toe to the wound bed with dressing changes. He has no issues or complaints today. He denies signs of infection. 2/17; patient presents for follow-up. He had his MRI that showed bone marrow edema at the distal aspect  concerning for osteomyelitis. Currently patient denies signs of infection. He feels well and has been using gentamicin with Hydrofera Blue to the wound bed. He states that he has ordered custom diabetic shoes. He obtains these at the end of the month. 3/3; patient presents for follow-up. He had a scheduled appointment with Dr. Megan Jackson, infectious disease yesterday. Patient canceled  and at this time does not want to follow-up with any other doctors for this issue. He currently denies signs of infection. He has been using gentamicin ointment with Hydrofera Blue to the wound bed. He has orthopedic shoes and has an area that offloads the left great toe. 3/17; patient presents for follow-up. He reports stubbing his left great toe that has the wound. He has been using gentamicin ointment and Hydrofera Blue with dressing changes. He has orthopedic shoes that offload the left great toe however he is not wearing them today and he confirms that he does not wear these every day. 3/27; patient presents for follow-up. He has his surgical shoe with a cut out to the left great toe. Unfortunately his foot is shifting in the shoe and pressure continues as noted by the drainage location. He continues to use gentamicin ointment with Hydrofera Blue. He denies signs of infection. He reports less drainage to the wound site. 5/5; patient presents for follow-up. Unfortunately the patient did not follow-up as recommended. I have not seen this patient in over 4 weeks. He has no issues or complaints today. He states he is using gentamicin ointment and Hydrofera Blue however the blue dressing is not staying in place. He denies signs of systemic infection. 5/16; patient presents for follow-up. He obtained his Keystone antibiotics and has been using this daily for the past week. He reports improvement in wound healing. Due to financial constraints he states he would only be able to follow-up every 3 weeks. 6/15; patient  presents for follow-up. He has been using Keystone antibiotic ointment daily. It is unclear if he is able to do this properly since he cannot reach his feet very easily. He has nobody to help him with dressing changes. He had an MRI of his left foot completed on 06/14/2021 that showed changes concerning for osteomyelitis to the left great toe. At that time I had referred him to infectious disease however he did not show up to his appointment and did not want to follow-up at that time. Today he states he would like to see infectious disease. We set up an appointment for him next Thursday at 930 with Dr. Orvan Falconerampbell. He currently denies signs of infection. He is not able to aggressively offload this area. He is open to doing the total contact cast. 6/23; patient presents for follow-up. He saw Dr. Orvan Falconerampbell with infectious disease for MRI findings suggesting osteomyelitis in the distal tuft of the left great toe. CRP and sed rate were normal. Patient was started on Bactrim and Augmentin. No active signs of acute infection at this time. Patient has been using Keystone antibiotics. He is open to doing the total contact cast today. 6/26; patient presents for follow-up. Patient has been taking Bactrim and Augmentin without issues. He had a total contact cast placed last Friday and presents for obligatory cast change. He reports no issues with the cast. 6/29; patient presents for follow-up. He presents for his obligatory cast exchange. He had no issues with the cast over the past couple of days. 7/6; patient presents for follow-up. He tolerated the total contact cast well. We have been using Hydrofera Blue although it appears its not staying well in place after the cast was removed. He denies signs of infection. 7/13; patient presents for follow-up. He has tolerated the caudal contact cast well for the past week. We have been using Keystone antibiotic and silver alginate under the cast. He currently denies signs of  infection. Electronic Signature(s)  Signed: 11/13/2021 4:21:31 PM By: Geralyn Corwin DO Entered By: Geralyn Corwin on 11/13/2021 16:05:22 -------------------------------------------------------------------------------- Physical Exam Details Patient Name: Date of Service: [name] 11/13/2021 2:30 PM Medical Record Number: [id number] Patient Account Number: [id number] Date of Birth/Sex: Treating RN: 01/25/1946 (76 y.o. [name] Primary Care Provider: [name] Other Clinician: Referring Provider: Treating Provider/Extender: Zena Amos in Treatment: 24 Constitutional respirations regular, non-labored and within target range for patient.. Cardiovascular 2+ dorsalis pedis/posterior tibialis pulses. Psychiatric pleasant and cooperative. Notes Left great toe: T the plantar aspect there is an open wound with granulation tissue and nonviable tissue. o Electronic Signature(s) Signed: 11/13/2021 4:21:31 PM By: Geralyn Corwin DO Entered By: Geralyn Corwin on 11/13/2021 16:05:59 -------------------------------------------------------------------------------- Physician Orders Details Patient Name: Date of Service: Sol Passer, Alex BERT W. 11/13/2021 2:30 PM Medical Record Number: [id number] Patient Account Number: [id number] Date of Birth/Sex: Treating RN: 03-31-1946 (76 y.o. [name] Primary Care Provider: [name] Other Clinician: Referring Provider: Treating Provider/Extender: Zena Amos in Treatment: 24 Verbal / Phone Orders: No Diagnosis Coding Follow-up Appointments ppointment in 1 week. - Thursday 11/20/2021 @ 1430 w/ Dr. Mikey Bussing and Maryruth Bun Room # 9 Return A Bathing/ Shower/ Hygiene May shower and wash wound with soap and water. - with dressing changes. Edema Control - Lymphedema / SCD / Other Elevate legs to the level of the heart or above for 30 minutes daily and/or when sitting,  a frequency of: - 3-4 times a day throughout the day. Avoid standing for long periods of time. Moisturize legs daily. - every night before bed. Off-Loading Total Contact Cast to Left Lower Extremity Wound Treatment Wound #1 - T Great oe Wound Laterality: Left Prim Dressing: PolyMem Silver Non-Adhesive Dressing, 4.25x4.25 in 1 x Per Week ary Discharge Instructions: Apply to wound bed as instructed Secondary Dressing: Woven Gauze Sponges 2x2 in 1 x Per Week Discharge Instructions: Apply over primary dressing as directed. Secured With: Insurance underwriter, Sterile 2x75 (in/in) 1 x Per Week Discharge Instructions: Secure with stretch gauze as directed. Electronic Signature(s) Signed: 11/13/2021 4:21:31 PM By: Geralyn Corwin DO Entered By: Geralyn Corwin on 11/13/2021 16:06:07 -------------------------------------------------------------------------------- Problem List Details Patient Name: Date of Service: Sol Passer, Alex BERT W. 11/13/2021 2:30 PM Medical Record Number: [id number] Patient Account Number: [id number] Date of Birth/Sex: Treating RN: Feb 18, 1946 (76 y.o. [name], Alex Jackson Primary Care Provider: [name] Other Clinician: Referring Provider: Treating Provider/Extender: Zena Amos in Treatment: 24 Active Problems ICD-10 Encounter Code Description Active Date MDM Diagnosis [phone] Non-pressure chronic ulcer of other part of left foot with fat layer exposed 05/26/2021 No Yes E11.621 Type 2 diabetes mellitus with foot ulcer 05/26/2021 No Yes Z95.5 Presence of coronary angioplasty implant and graft 05/26/2021 No Yes E78.5 Hyperlipidemia, unspecified 05/26/2021 No Yes M54.50 Low back pain, unspecified 05/26/2021 No Yes Inactive Problems Resolved Problems Electronic Signature(s) Signed: 11/13/2021 4:21:31 PM By: Geralyn Corwin DO Entered By: Geralyn Corwin on 11/13/2021  16:04:35 -------------------------------------------------------------------------------- Progress Note Details Patient Name: Date of Service: Sol Passer, Alex BERT W. 11/13/2021 2:30 PM Medical Record Number: [id number] Patient Account Number: [id number] Date of Birth/Sex: Treating RN: [birth year] (76 y.o. [name], Alex Jackson Primary Care Provider: [name] Other Clinician: Referring Provider: Treating Provider/Extender: Zena Amos in Treatment: 24 Subjective Chief Complaint Information obtained from Patient Left great toe wound History of Present Illness (HPI) Admission 05/26/2021 Mr. Alex Jackson is a 76 year old male with  a past medical history of controlled type 2 diabetes on oral agents, chronic back pain and coronary artery disease status post stenting to the LAD that presents to the clinic for a 69-month history of nonhealing wound to the left great toe plantar aspect. He has peripheral neuropathy and is not sure how the wound developed. He has been following with Alex Jackson for this issue and has been using Betadine to the area daily. He does not offload the area. He denies signs of infection. 2/3; patient presents for follow-up. He obtained his left foot x-ray. He has been using gentamicin with hydrofera blue toe to the wound bed with dressing changes. He has no issues or complaints today. He denies signs of infection. 2/17; patient presents for follow-up. He had his MRI that showed bone marrow edema at the distal aspect concerning for osteomyelitis. Currently patient denies signs of infection. He feels well and has been using gentamicin with Hydrofera Blue to the wound bed. He states that he has ordered custom diabetic shoes. He obtains these at the end of the month. 3/3; patient presents for follow-up. He had a scheduled appointment with Dr. Megan Jackson, infectious disease yesterday. Patient canceled and at this time does not want to follow-up with any  other doctors for this issue. He currently denies signs of infection. He has been using gentamicin ointment with Hydrofera Blue to the wound bed. He has orthopedic shoes and has an area that offloads the left great toe. 3/17; patient presents for follow-up. He reports stubbing his left great toe that has the wound. He has been using gentamicin ointment and Hydrofera Blue with dressing changes. He has orthopedic shoes that offload the left great toe however he is not wearing them today and he confirms that he does not wear these every day. 3/27; patient presents for follow-up. He has his surgical shoe with a cut out to the left great toe. Unfortunately his foot is shifting in the shoe and pressure continues as noted by the drainage location. He continues to use gentamicin ointment with Hydrofera Blue. He denies signs of infection. He reports less drainage to the wound site. 5/5; patient presents for follow-up. Unfortunately the patient did not follow-up as recommended. I have not seen this patient in over 4 weeks. He has no issues or complaints today. He states he is using gentamicin ointment and Hydrofera Blue however the blue dressing is not staying in place. He denies signs of systemic infection. 5/16; patient presents for follow-up. He obtained his Keystone antibiotics and has been using this daily for the past week. He reports improvement in wound healing. Due to financial constraints he states he would only be able to follow-up every 3 weeks. 6/15; patient presents for follow-up. He has been using Keystone antibiotic ointment daily. It is unclear if he is able to do this properly since he cannot reach his feet very easily. He has nobody to help him with dressing changes. He had an MRI of his left foot completed on 06/14/2021 that showed changes concerning for osteomyelitis to the left great toe. At that time I had referred him to infectious disease however he did not show up to his appointment and  did not want to follow-up at that time. Today he states he would like to see infectious disease. We set up an appointment for him next Thursday at 29 with Dr. Megan Jackson. He currently denies signs of infection. He is not able to aggressively offload this area. He is open to doing  the total contact cast. 6/23; patient presents for follow-up. He saw Dr. Orvan Falconer with infectious disease for MRI findings suggesting osteomyelitis in the distal tuft of the left great toe. CRP and sed rate were normal. Patient was started on Bactrim and Augmentin. No active signs of acute infection at this time. Patient has been using Keystone antibiotics. He is open to doing the total contact cast today. 6/26; patient presents for follow-up. Patient has been taking Bactrim and Augmentin without issues. He had a total contact cast placed last Friday and presents for obligatory cast change. He reports no issues with the cast. 6/29; patient presents for follow-up. He presents for his obligatory cast exchange. He had no issues with the cast over the past couple of days. 7/6; patient presents for follow-up. He tolerated the total contact cast well. We have been using Hydrofera Blue although it appears its not staying well in place after the cast was removed. He denies signs of infection. 7/13; patient presents for follow-up. He has tolerated the caudal contact cast well for the past week. We have been using Keystone antibiotic and silver alginate under the cast. He currently denies signs of infection. Patient History Information obtained from Patient. Family History Cancer - Father,Mother, Hypertension - Mother, No family history of Diabetes, Heart Disease, Hereditary Spherocytosis, Kidney Disease, Lung Disease, Seizures, Stroke, Thyroid Problems, Tuberculosis. Social History Former smoker - quit 1985, Alcohol Use - Daily - wine or scotch, Drug Use - No History, Caffeine Use - Never. Medical History Eyes Patient has  history of Cataracts - removed Denies history of Glaucoma, Optic Neuritis Ear/Nose/Mouth/Throat Denies history of Chronic sinus problems/congestion, Middle ear problems Hematologic/Lymphatic Denies history of Hemophilia, Human Immunodeficiency Virus, Lymphedema, Sickle Cell Disease Respiratory Denies history of Aspiration, Asthma, Chronic Obstructive Pulmonary Disease (COPD), Pneumothorax, Sleep Apnea, Tuberculosis Cardiovascular Patient has history of Coronary Artery Disease, Hypertension, Myocardial Infarction - NSTEMI Denies history of Angina, Arrhythmia, Congestive Heart Failure, Deep Vein Thrombosis, Hypotension, Peripheral Arterial Disease, Peripheral Venous Disease, Phlebitis, Vasculitis Gastrointestinal Denies history of Cirrhosis , Colitis, Crohnoos, Hepatitis A, Hepatitis B, Hepatitis C Endocrine Patient has history of Type II Diabetes - PCP notes type II; per patient takes metformin been told prediabetic Denies history of Type I Diabetes Genitourinary Denies history of End Stage Renal Disease Immunological Denies history of Lupus Erythematosus, Raynaudoos, Scleroderma Integumentary (Skin) Denies history of History of Burn Musculoskeletal Denies history of Gout, Rheumatoid Arthritis, Osteoarthritis, Osteomyelitis Neurologic Denies history of Dementia, Neuropathy, Quadriplegia, Paraplegia, Seizure Disorder Oncologic Denies history of Received Chemotherapy, Received Radiation Psychiatric Denies history of Anorexia/bulimia, Confinement Anxiety Hospitalization/Surgery History - stents MRI 2017and2019. - torn meniscus left 5 weeks ago. Medical A Surgical History Notes nd Constitutional Symptoms (General Health) Back pain x6 months Cardiovascular hyperlipidemia Genitourinary Elevated PSA Objective Constitutional respirations regular, non-labored and within target range for patient.. Vitals Time Taken: 3:02 PM, Height: 71 in, Weight: 252 lbs, BMI: 35.1, Temperature:  98.7 F, Pulse: 74 bpm, Respiratory Rate: 17 breaths/min, Blood Pressure: 106/57 mmHg. Cardiovascular 2+ dorsalis pedis/posterior tibialis pulses. Psychiatric pleasant and cooperative. General Notes: Left great toe: T the plantar aspect there is an open wound with granulation tissue and nonviable tissue. o Integumentary (Hair, Skin) Wound #1 status is Open. Original cause of wound was Blister. The date acquired was: 10/02/2020. The wound has been in treatment 24 weeks. The wound is located on the Left T Great. The wound measures 0.5cm length x 0.3cm width x 0.4cm depth; 0.118cm^2 area and 0.047cm^3 volume. There is Fat Layer  oe (Subcutaneous Tissue) exposed. There is a medium amount of serosanguineous drainage noted. The wound margin is distinct with the outline attached to the wound base. There is large (67-100%) red, pink granulation within the wound bed. There is no necrotic tissue within the wound bed. Assessment Active Problems ICD-10 Non-pressure chronic ulcer of other part of left foot with fat layer exposed Type 2 diabetes mellitus with foot ulcer Presence of coronary angioplasty implant and graft Hyperlipidemia, unspecified Low back pain, unspecified Patient's wound is stable. I debrided nonviable tissue. No signs of surrounding infection. There is more moisture/maceration to the wound bed. I am worried this is being caused by the [facility] Show Low antibiotics. I recommended stopping this and continuing with silver alginate under the total contact cast. Procedures Wound #1 Pre-procedure diagnosis of Wound #1 is a Diabetic Wound/Ulcer of the Lower Extremity located on the Left T Great .Severity of Tissue Pre Debridement is: oe Fat layer exposed. There was a Excisional Skin/Subcutaneous Tissue Debridement with a total area of 0.15 sq cm performed by Alex Shan, DO. With the following instrument(s): Curette to remove Viable and Non-Viable tissue/material. Material removed includes  Subcutaneous Tissue, Slough, Skin: Dermis, and Skin: Epidermis after achieving pain control using Lidocaine. No specimens were taken. A time out was conducted at 15:27, prior to the start of the procedure. A Minimum amount of bleeding was controlled with Pressure. The procedure was tolerated well with a pain level of 0 throughout and a pain level of 0 following the procedure. Post Debridement Measurements: 0.5cm length x 0.3cm width x 0.4cm depth; 0.047cm^3 volume. Character of Wound/Ulcer Post Debridement is improved. Severity of Tissue Post Debridement is: Fat layer exposed. Post procedure Diagnosis Wound #1: Same as Pre-Procedure Pre-procedure diagnosis of Wound #1 is a Diabetic Wound/Ulcer of the Lower Extremity located on the Left T Great . There was a T Contact Cast oe otal Procedure by Alex Shan, DO. Post procedure Diagnosis Wound #1: Same as Pre-Procedure Plan Follow-up Appointments: Return Appointment in 1 week. - Thursday 11/20/2021 @ 1430 w/ Dr. Heber Helmetta and Allayne Butcher Room # 9 Bathing/ Shower/ Hygiene: May shower and wash wound with soap and water. - with dressing changes. Edema Control - Lymphedema / SCD / Other: Elevate legs to the level of the heart or above for 30 minutes daily and/or when sitting, a frequency of: - 3-4 times a day throughout the day. Avoid standing for long periods of time. Moisturize legs daily. - every night before bed. Off-Loading: T Contact Cast to Left Lower Extremity otal WOUND #1: - T Great Wound Laterality: Left oe Prim Dressing: PolyMem Silver Non-Adhesive Dressing, 4.25x4.25 in 1 x Per Week/ ary Discharge Instructions: Apply to wound bed as instructed Secondary Dressing: Woven Gauze Sponges 2x2 in 1 x Per Week/ Discharge Instructions: Apply over primary dressing as directed. Secured With: Child psychotherapist, Sterile 2x75 (in/in) 1 x Per Week/ Discharge Instructions: Secure with stretch gauze as directed. 1. In office sharp  debridement 2. Silver alginate 3. T contact cast placed in standard fashion otal Electronic Signature(s) Signed: 11/13/2021 4:21:31 PM By: Alex Shan DO Entered By: Alex Jackson on 11/13/2021 16:09:05 -------------------------------------------------------------------------------- HxROS Details Patient Name: Date of Service: Alex Jackson, Alex BERT W. 11/13/2021 2:30 PM Medical Record Number: [id number] Patient Account Number: [id number] Date of Birth/Sex: Treating RN: 1945/05/31 (76 y.o. Alex Jackson Primary Care Provider: London Jackson Other Clinician: Referring Provider: Treating Provider/Extender: Alex Jackson in Treatment: 24 Information Obtained From Patient Constitutional Symptoms (General  Health) Medical History: Past Medical History Notes: Back pain x6 months Eyes Medical History: Positive for: Cataracts - removed Negative for: Glaucoma; Optic Neuritis Ear/Nose/Mouth/Throat Medical History: Negative for: Chronic sinus problems/congestion; Middle ear problems Hematologic/Lymphatic Medical History: Negative for: Hemophilia; Human Immunodeficiency Virus; Lymphedema; Sickle Cell Disease Respiratory Medical History: Negative for: Aspiration; Asthma; Chronic Obstructive Pulmonary Disease (COPD); Pneumothorax; Sleep Apnea; Tuberculosis Cardiovascular Medical History: Positive for: Coronary Artery Disease; Hypertension; Myocardial Infarction - NSTEMI Negative for: Angina; Arrhythmia; Congestive Heart Failure; Deep Vein Thrombosis; Hypotension; Peripheral Arterial Disease; Peripheral Venous Disease; Phlebitis; Vasculitis Past Medical History Notes: hyperlipidemia Gastrointestinal Medical History: Negative for: Cirrhosis ; Colitis; Crohns; Hepatitis A; Hepatitis B; Hepatitis C Endocrine Medical History: Positive for: Type II Diabetes - PCP notes type II; per patient takes metformin been told prediabetic Negative for: Type I  Diabetes Time with diabetes: prediabetic 3-4 years per pt Treated with: Oral agents, Diet Blood sugar tested every day: No Genitourinary Medical History: Negative for: End Stage Renal Disease Past Medical History Notes: Elevated PSA Immunological Medical History: Negative for: Lupus Erythematosus; Raynauds; Scleroderma Integumentary (Skin) Medical History: Negative for: History of Burn Musculoskeletal Medical History: Negative for: Gout; Rheumatoid Arthritis; Osteoarthritis; Osteomyelitis Neurologic Medical History: Negative for: Dementia; Neuropathy; Quadriplegia; Paraplegia; Seizure Disorder Oncologic Medical History: Negative for: Received Chemotherapy; Received Radiation Psychiatric Medical History: Negative for: Anorexia/bulimia; Confinement Anxiety HBO Extended History Items Eyes: Cataracts Immunizations Pneumococcal Vaccine: Received Pneumococcal Vaccination: Yes Received Pneumococcal Vaccination On or After 60th Birthday: Yes Implantable Devices No devices added Hospitalization / Surgery History Type of Hospitalization/Surgery stents MRI 2017and2019 torn meniscus left 5 weeks ago Family and Social History Cancer: Yes - Father,Mother; Diabetes: No; Heart Disease: No; Hereditary Spherocytosis: No; Hypertension: Yes - Mother; Kidney Disease: No; Lung Disease: No; Seizures: No; Stroke: No; Thyroid Problems: No; Tuberculosis: No; Former smoker - quit 1985; Alcohol Use: Daily - wine or scotch; Drug Use: No History; Caffeine Use: Never; Financial Concerns: No; Food, Clothing or Shelter Needs: No; Support System Lacking: No; Transportation Concerns: No Electronic Signature(s) Signed: 11/13/2021 4:21:31 PM By: Alex Shan DO Signed: 11/14/2021 11:57:18 AM By: Alex Hammock RN Entered By: Alex Jackson on 11/13/2021 16:05:29 -------------------------------------------------------------------------------- Total Contact Cast Details Patient Name: Date of  Service: Alex Jackson, Alex BERT W. 11/13/2021 2:30 PM Medical Record Number: [id number] Patient Account Number: [id number] Date of Birth/Sex: Treating RN: [birth year] (76 y.o. Alex Jackson, Alex Jackson Primary Care Provider: London Jackson Other Clinician: Referring Provider: Treating Provider/Extender: Alex Jackson in Treatment: 24 T Contact Cast Applied for Wound Assessment: otal Wound #1 Left T Great oe Performed By: Physician Alex Shan, DO Post Procedure Diagnosis Same as Pre-procedure Electronic Signature(s) Signed: 11/13/2021 4:21:31 PM By: Alex Shan DO Signed: 11/14/2021 11:57:18 AM By: Alex Hammock RN Entered By: Alex Jackson on 11/13/2021 15:32:28 -------------------------------------------------------------------------------- SuperBill Details Patient Name: Date of Service: Alex Jackson, Alex BERT W. 11/13/2021 Medical Record Number: [id number] Patient Account Number: [id number] Date of Birth/Sex: Treating RN: [birth year] (76 y.o. Alex Jackson, Alex Jackson Primary Care Provider: London Jackson Other Clinician: Referring Provider: Treating Provider/Extender: Alex Jackson in Treatment: 24 Diagnosis Coding ICD-10 Codes Code Description [phone] Non-pressure chronic ulcer of other part of left foot with fat layer exposed E11.621 Type 2 diabetes mellitus with foot ulcer Z95.5 Presence of coronary angioplasty implant and graft E78.5 Hyperlipidemia, unspecified M54.50 Low back pain, unspecified Facility Procedures CPT4 Code: [id number] Description: 11042 - DEB SUBQ TISSUE 20 SQ CM/< ICD-10 Diagnosis Description L97.522 Non-pressure chronic ulcer of other part  of left foot with fat layer exposed Modifier: Quantity: 1 Physician Procedures : CPT4 Code Description Modifier [id number] - WC PHYS SUBQ TISS 20 SQ CM ICD-10 Diagnosis Description L97.522 Non-pressure chronic ulcer of other part of left foot with fat layer  exposed Quantity: 1 Electronic Signature(s) Signed: 11/13/2021 4:21:31 PM By: Alex Shan DO Entered By: Alex Jackson on 11/13/2021 16:09:21

## 2021-11-17 DIAGNOSIS — R972 Elevated prostate specific antigen [PSA]: Secondary | ICD-10-CM | POA: Diagnosis not present

## 2021-11-18 DIAGNOSIS — M5117 Intervertebral disc disorders with radiculopathy, lumbosacral region: Secondary | ICD-10-CM | POA: Diagnosis not present

## 2021-11-18 DIAGNOSIS — R262 Difficulty in walking, not elsewhere classified: Secondary | ICD-10-CM | POA: Diagnosis not present

## 2021-11-18 DIAGNOSIS — M25652 Stiffness of left hip, not elsewhere classified: Secondary | ICD-10-CM | POA: Diagnosis not present

## 2021-11-18 DIAGNOSIS — M25651 Stiffness of right hip, not elsewhere classified: Secondary | ICD-10-CM | POA: Diagnosis not present

## 2021-11-20 ENCOUNTER — Encounter (HOSPITAL_BASED_OUTPATIENT_CLINIC_OR_DEPARTMENT_OTHER): Payer: PPO | Admitting: Internal Medicine

## 2021-11-20 ENCOUNTER — Other Ambulatory Visit: Payer: Self-pay

## 2021-11-20 ENCOUNTER — Ambulatory Visit (INDEPENDENT_AMBULATORY_CARE_PROVIDER_SITE_OTHER): Payer: PPO | Admitting: Internal Medicine

## 2021-11-20 ENCOUNTER — Telehealth: Payer: Self-pay | Admitting: Cardiology

## 2021-11-20 DIAGNOSIS — M545 Low back pain, unspecified: Secondary | ICD-10-CM

## 2021-11-20 DIAGNOSIS — E119 Type 2 diabetes mellitus without complications: Secondary | ICD-10-CM

## 2021-11-20 DIAGNOSIS — L97522 Non-pressure chronic ulcer of other part of left foot with fat layer exposed: Secondary | ICD-10-CM | POA: Diagnosis not present

## 2021-11-20 DIAGNOSIS — M86672 Other chronic osteomyelitis, left ankle and foot: Secondary | ICD-10-CM | POA: Diagnosis not present

## 2021-11-20 DIAGNOSIS — E11621 Type 2 diabetes mellitus with foot ulcer: Secondary | ICD-10-CM | POA: Diagnosis not present

## 2021-11-20 DIAGNOSIS — Z955 Presence of coronary angioplasty implant and graft: Secondary | ICD-10-CM | POA: Diagnosis not present

## 2021-11-20 MED ORDER — OZEMPIC (0.25 OR 0.5 MG/DOSE) 2 MG/1.5ML ~~LOC~~ SOPN: 0.2500 mg | PEN_INJECTOR | SUBCUTANEOUS | 1 refills | Status: DC

## 2021-11-20 NOTE — Telephone Encounter (Signed)
Yes - see other phone note from today. Pharmacy aware to fill.

## 2021-11-20 NOTE — Progress Notes (Signed)
[facility]  Patient Active Problem List   Diagnosis Date Noted   Diabetic infection of left foot (HCC) 10/22/2021    Priority: High   Osteomyelitis of left foot (HCC) 10/22/2021    Priority: High   Piriformis syndrome of left side 07/23/2017   Coronary artery disease involving native coronary artery of native heart with unstable angina pectoris (HCC) 02/11/2017   Lumbar spondylosis 02/11/2017   Iliotibial band syndrome of both sides 02/11/2017   H/O hyperglycemia 02/11/2017   Chronic lower back pain 02/11/2017   Cardiomyopathy, ischemic 09/16/2015   SOB (shortness of breath) 09/16/2015   LV dysfunction 09/16/2015   Hyperlipidemia 03/02/2015   Essential hypertension 03/02/2015   S/P drug eluting coronary stent placement 03/02/2015   NSTEMI (non-ST elevated myocardial infarction) (HCC) 03/01/2015    Patient's Medications  New Prescriptions   No medications on file  Previous Medications   ACETAMINOPHEN (TYLENOL) 325 MG TABLET    Take 2 tablets (650 mg total) by mouth every 4 (four) hours as needed for headache or mild pain.   AMOXICILLIN-CLAVULANATE (AUGMENTIN) 875-125 MG TABLET    Take 1 tablet by mouth 2 (two) times daily.   ASPIRIN EC 81 MG TABLET    Take 81 mg by mouth daily.   BISOPROLOL (ZEBETA) 10 MG TABLET    TAKE ONE TABLET BY MOUTH EVERY MORNING   CHLORTHALIDONE (HYGROTON) 25 MG TABLET    TAKE ONE TABLET BY MOUTH ONCE DAILY   CYCLOBENZAPRINE (FLEXERIL) 10 MG TABLET    Take 1 tablet (10 mg total) by mouth 2 (two) times daily as needed for up to 20 doses for muscle spasms.   FLUTICASONE (FLONASE) 50 MCG/ACT NASAL SPRAY    USE 1 SPRAY IN EACH NOSTRIL EVERY DAY   FUROSEMIDE (LASIX) 40 MG TABLET    Take 1 tablet (40 mg total) by mouth daily.   HYDROXYPROPYL METHYLCELLULOSE / HYPROMELLOSE (ISOPTO TEARS / GONIOVISC) 2.5 % OPHTHALMIC SOLUTION    Place 1 drop into both eyes as needed for dry eyes.   LISINOPRIL (ZESTRIL) 40 MG TABLET    TAKE  ONE TABLET BY MOUTH ONCE DAILY   LOTEPREDNOL (LOTEMAX) 0.5 % OPHTHALMIC SUSPENSION       METFORMIN (GLUCOPHAGE) 500 MG TABLET    Take 1 tablet (500 mg total) by mouth 2 (two) times daily.   MULTIPLE VITAMIN (MULTIVITAMIN WITH MINERALS) TABS TABLET    Take 1 tablet by mouth daily.   NITROGLYCERIN (NITROSTAT) 0.4 MG SL TABLET    Place 1 tablet (0.4 mg total) under the tongue every 5 (five) minutes x 3 doses as needed for chest pain.   OMEGA-3 ACID ETHYL ESTERS (LOVAZA) 1 G CAPSULE    TAKE ONE CAPSULE BY MOUTH EVERY MORNING and TAKE ONE CAPSULE BY MOUTH EVERYDAY AT BEDTIME   PANTOPRAZOLE (PROTONIX) 40 MG TABLET    TAKE ONE TABLET BY MOUTH ONCE DAILY   PREGABALIN (LYRICA) 75 MG CAPSULE    Take 1 capsule (75 mg total) by mouth 2 (two) times daily.   ROSUVASTATIN (CRESTOR) 10 MG TABLET    Take 1 tablet (10 mg total) by mouth daily.   SEMAGLUTIDE,0.25 OR 0.5MG /DOS, (OZEMPIC, 0.25 OR 0.5 MG/DOSE,) 2 MG/1.5ML SOPN    Inject 0.5 mg into the skin once a week.   SULFAMETHOXAZOLE-TRIMETHOPRIM (BACTRIM DS) 800-160 MG TABLET    Take 1 tablet by mouth 2 (two) times daily.  Modified Medications   No medications on file  Discontinued Medications   No medications on file    Subjective: Nadine Counts is in for his routine follow-up visit. 1 month ago after he had developed osteomyelitis of the distal tuft of his left great toe complicating a diabetic foot ulcer.  I started him on amoxicillin/clavulanate and trimethoprim sulfamethoxazole.  He called back shortly after that visit saying that he was having some problems with nausea and vomiting.  Shortly before developing the GI upset his Ozempic dose had also been doubled.  He decreased his Ozempic dose back to what it had been and the nausea and vomiting resolved.  He has been frustrated by the cost of his wound care and contact casting but is happy that the ulcer on his great toe is improving fairly rapidly.  He shows me a picture taking 2 weeks ago that showed the ulcer had  decreased by about 50% and he says that it is much better since then.  He is scheduled to have his cast removed at the wound center later today.  Review of Systems: Review of Systems  Constitutional:  Negative for fever.  Gastrointestinal:  Negative for diarrhea, nausea and vomiting.    Past Medical History:  Diagnosis Date   CAD in native artery 03/01/2015   1. cath - DES to LAD residual non obstructive RCA disease in 2016 b. Cath s/p Successful PCI with stenting of the distal RCA into the PLOM with a DES. Balloon angioplasty of the PDA through the stent struts.    Diabetes mellitus without complication (HCC)    Edema of both lower extremities    Hyperlipidemia    Hypertension    Leg weakness    Overweight    Spinal stenosis    Ulcer of great toe (HCC)     Social History   Tobacco Use   Smoking status: Former    Packs/day: 1.00    Types: Cigarettes    Quit date: 07/03/1983    Years since quitting: 38.4   Smokeless tobacco: Never   Tobacco comments:    quit in Mar 1985  Vaping Use   Vaping Use: Never used  Substance Use Topics   Alcohol use: Yes    Alcohol/week: 0.0 standard drinks of alcohol    Comment: "one or two drinks a night"   Drug use: No    Family History  Problem Relation Age of Onset   Thyroid cancer Mother    Liver cancer Father        pancreatic   Arrhythmia Sister     Allergies  Allergen Reactions   Prednisone Other (See Comments)    Makes him crazy   Atorvastatin Other (See Comments)    Pt reports "causes lower extremity muscle aches."   Pravastatin Other (See Comments)    Pt reports "causes bilateral calf cramping."    Objective: Vitals:   11/20/21 0958  BP: 124/71  Pulse: 95  Resp: 16  SpO2: 95%  Weight: 244 lb (110.7 kg)  Height: 5\' 11"  (1.803 m)   Body mass index is 34.03 kg/m.  Physical Exam Constitutional:      Comments: He is pleasant and talkative.  Musculoskeletal:     Comments: His left lower leg and foot l are  casted.    Photo taken 2 weeks ago.  Lab Results    Problem List Items Addressed This Visit       High   Osteomyelitis of left foot (HCC)    His left great toe ulcer and hopefully his  underlying osteomyelitis are improving fairly rapidly with wound care and empiric antibiotic therapy.  He has 5 more days of his present subscription.  He is not terribly eager to stay on antibiotics.  I asked that a picture be taken today when his cast is removed.  I will arrange a phone visit with him next week to determine if he will stop antibiotics or continue for a few more weeks.        Cliffton Asters, MD [facility] [phone] pager   [phone] cell 11/20/2021, 10:31 AM

## 2021-11-20 NOTE — Assessment & Plan Note (Signed)
His left great toe ulcer and hopefully his underlying osteomyelitis are improving fairly rapidly with wound care and empiric antibiotic therapy.  He has 5 more days of his present subscription.  He is not terribly eager to stay on antibiotics.  I asked that a picture be taken today when his cast is removed.  I will arrange a phone visit with him next week to determine if he will stop antibiotics or continue for a few more weeks.

## 2021-11-20 NOTE — Telephone Encounter (Signed)
Patient spoke to chris on 7/13 and wanted to stay on the 0.25mg  dose due to vomitting with the 0.5mg  dose. Will send in Rx

## 2021-11-20 NOTE — Progress Notes (Signed)
Alex Jackson, Alex Jackson ([id number]) Visit Report for 11/20/2021 Arrival Information Details Patient Name: Date of Service: Alex Jackson. 11/20/2021 11:30 A M Medical Record Number: [id number] Patient Account Number: [id number] Date of Birth/Sex: Treating RN: [birth year] (76 y.o. Alex Jackson, Alex Primary Care Alex Jackson: Farris Jackson Other Clinician: Haywood Jackson Referring Alex Jackson: Treating Alex Jackson/Extender: Alex Jackson in Treatment: 25 Visit Information History Since Last Visit All ordered tests and consults were completed: Yes Patient Arrived: Ambulatory Added or deleted any medications: No Arrival Time: 11:38 Any new allergies or adverse reactions: No Accompanied By: self Had a fall or experienced change in No Transfer Assistance: None activities of daily living that may affect Patient Identification Verified: Yes risk of falls: Secondary Verification Process Completed: Yes Signs or symptoms of abuse/neglect since last visito No Patient Requires Transmission-Based Precautions: No Hospitalized since last visit: No Patient Jackson Alerts: Yes Implantable device outside of the clinic excluding No Patient Alerts: 11/18/20 ABI L1.04 cellular tissue based products placed in the center 11/18/20 TBI L 1.35 since last visit: Pain Present Now: No Electronic Signature(s) Signed: 11/20/2021 4:28:42 PM By: Alex Mu RN Entered By: Alex Jackson on 11/20/2021 11:39:02 -------------------------------------------------------------------------------- Clinic Level of Care Assessment Details Patient Name: Date of Service: Alex Jackson W. 11/20/2021 11:30 A M Medical Record Number: [id number] Patient Account Number: [id number] Date of Birth/Sex: Treating RN: Alex Jackson (76 y.o. Alex Jackson, Alex Primary Care [name]: Farris Jackson Other Clinician: Referring Leyton Brownlee: Treating [name]/Extender: Alex Jackson in  Treatment: 25 Clinic Level of Care Assessment Items TOOL 4 Quantity Score X- 1 0 Use when only an EandM is performed on FOLLOW-UP visit ASSESSMENTS - Nursing Assessment / Reassessment X- 1 10 Reassessment of Co-morbidities (includes updates in patient status) X- 1 5 Reassessment of Adherence to Treatment Plan ASSESSMENTS - Wound and Skin A ssessment / Reassessment X - Simple Wound Assessment / Reassessment - one wound 1 5 []  - 0 Complex Wound Assessment / Reassessment - multiple wounds []  - 0 Dermatologic / Skin Assessment (not related to wound area) ASSESSMENTS - Focused Assessment []  - 0 Circumferential Edema Measurements - multi extremities []  - 0 Nutritional Assessment / Counseling / Intervention []  - 0 Lower Extremity Assessment (monofilament, tuning fork, pulses) []  - 0 Peripheral Arterial Disease Assessment (using hand held doppler) ASSESSMENTS - Ostomy and/or Continence Assessment and Care []  - 0 Incontinence Assessment and Management []  - 0 Ostomy Care Assessment and Management (repouching, etc.) PROCESS - Coordination of Care X - Simple Patient / Family Education for ongoing care 1 15 []  - 0 Complex (extensive) Patient / Family Education for ongoing care X- 1 10 Staff obtains , Records, T Results / Process Orders est []  - 0 Staff telephones HHA, Nursing Homes / Clarify orders / etc []  - 0 Routine Transfer to another Facility (non-emergent condition) []  - 0 Routine Hospital Admission (non-emergent condition) []  - 0 New Admissions / / Ordering NPWT Apligraf, etc. , []  - 0 Emergency Hospital Admission (emergent condition) X- 1 10 Simple Discharge Coordination []  - 0 Complex (extensive) Discharge Coordination PROCESS - Special Needs []  - 0 Pediatric / Minor Patient Management []  - 0 Isolation Patient Management []  - 0 Hearing / Language / Visual special needs []  - 0 Assessment of Community assistance (transportation,  D/C planning, etc.) []  - 0 Additional assistance / Altered mentation []  - 0 Support Surface(s) Assessment (bed, cushion, seat, etc.) INTERVENTIONS - Wound Cleansing / Measurement X -  Simple Wound Cleansing - one wound 1 5 []  - 0 Complex Wound Cleansing - multiple wounds X- 1 5 Wound Imaging (photographs - any number of wounds) []  - 0 Wound Tracing (instead of photographs) X- 1 5 Simple Wound Measurement - one wound []  - 0 Complex Wound Measurement - multiple wounds INTERVENTIONS - Wound Dressings X - Small Wound Dressing one or multiple wounds 1 10 []  - 0 Medium Wound Dressing one or multiple wounds []  - 0 Large Wound Dressing one or multiple wounds []  - 0 Application of Medications - topical []  - 0 Application of Medications - injection INTERVENTIONS - Miscellaneous []  - 0 External ear exam []  - 0 Specimen Collection (cultures, biopsies, blood, body fluids, etc.) []  - 0 Specimen(s) / Culture(s) sent or taken to Lab for analysis []  - 0 Patient Transfer (multiple staff / / Similar devices) []  - 0 Simple Staple / Suture removal (25 or less) []  - 0 Complex Staple / Suture removal (26 or more) []  - 0 Hypo / Hyperglycemic Management (close monitor of Blood Glucose) []  - 0 Ankle / Brachial Index (ABI) - do not check if billed separately X- 1 5 Vital Signs Jackson the patient been seen at the hospital within the last three years: Yes Total Score: 85 Level Of Care: New/Established - Level 3 Electronic Signature(s) Signed: 11/20/2021 4:28:42 PM By: RN Entered By: on 11/20/2021 12:04:11 -------------------------------------------------------------------------------- Encounter Discharge Information Details Patient Name: Date of Service: , Alex BERT W. 11/20/2021 11:30 A M Medical Record Number: Patient Account Number: Date of Birth/Sex: Treating RN: [birth year] (76 y.o. Nurse, adult, Alex Primary  Care Enya Bureau: Other Clinician: Referring Elisah Parmer: Treating [name]/Extender: in Treatment: 25 Encounter Discharge Information Items Discharge Condition: Stable Ambulatory Status: Ambulatory Discharge Destination: Home Transportation: Private Auto Accompanied By: self Schedule Follow-up Appointment: Yes Clinical Summary of Care: Patient Declined Electronic Signature(s) Signed: 11/20/2021 4:28:42 PM By: 11/22/2021 RN Entered By: Alex Jackson on 11/20/2021 12:04:48 -------------------------------------------------------------------------------- Lower Extremity Assessment Details Patient Name: Date of Service: 11/22/2021 W. 11/20/2021 11:30 A M Medical Record Number: 11/22/2021 Patient Account Number: [id number] Date of Birth/Sex: Treating RN: [birth year] Jackson (76 y.o. 67, Alex Primary Care Harshan Kearley: Alex Jackson Other Clinician: Referring Kimberley Speece: Treating Jonell Brumbaugh/Extender: Farris Jackson in Treatment: 25 Edema Assessment Assessed: Alex Jackson: Yes] 26: No] Edema: [Left: N] [Right: o] Calf Left: Right: Point of Measurement: 36 cm From Medial Instep 37 cm Ankle Left: Right: Point of Measurement: 12 cm From Medial Instep 22 cm Vascular Assessment Pulses: Dorsalis Pedis Palpable: [Left:Yes] Posterior Tibial Palpable: [Left:Yes] Electronic Signature(s) Signed: 11/20/2021 4:28:42 PM By: Alex Mu RN Entered By: Alex Jackson on 11/20/2021 11:42:51 -------------------------------------------------------------------------------- Multi Wound Chart Details Patient Name: Date of Service: Alex Jackson, Alex BERT W. 11/20/2021 11:30 A M Medical Record Number: [id number] Patient Account Number: [id number] Date of Birth/Sex: Treating RN: [birth year] Jackson (76 y.o. Alex Jackson, Alex Primary Care Tadashi Burkel: Farris Jackson Other Clinician: Referring Latona Krichbaum: Treating  Schae Cando/Extender: Alex Jackson in Treatment: 25 Vital Signs Height(in): 71 Pulse(bpm): 96 Weight(lbs): 252 Blood Pressure(mmHg): 119/66 Body Mass Index(BMI): 35.1 Temperature(F): 99.4 Respiratory Rate(breaths/min): 16 Photos: [N/A:N/A] Left T Great oe N/A N/A Wound Location: Blister N/A N/A Wounding Event: Diabetic Wound/Ulcer of the Lower N/A N/A Primary Etiology: Extremity Cataracts, Coronary Artery Disease, N/A N/A Comorbid History: Hypertension, Myocardial Infarction, Type II Diabetes 10/02/2020 N/A N/A Date Acquired: 25 N/A  N/A Weeks of Treatment: Healed - Epithelialized N/A N/A Wound Status: No N/A N/A Wound Recurrence: 0x0x0 N/A N/A Measurements L x W x D (cm) 0 N/A N/A A (cm) : rea 0 N/A N/A Volume (cm) : 100.00% N/A N/A % Reduction in A rea: 100.00% N/A N/A % Reduction in Volume: Grade 3 N/A N/A Classification: Medium N/A N/A Exudate A mount: Serosanguineous N/A N/A Exudate Type: red, brown N/A N/A Exudate Color: Distinct, outline attached N/A N/A Wound Margin: Large (67-100%) N/A N/A Granulation A mount: Red, Pink N/A N/A Granulation Quality: None Present (0%) N/A N/A Necrotic A mount: Fat Layer (Subcutaneous Tissue): Yes N/A N/A Exposed Structures: Fascia: No Tendon: No Muscle: No Joint: No Bone: No Large (67-100%) N/A N/A Epithelialization: Treatment Notes Wound #1 (Toe Great) Wound Laterality: Left Cleanser Peri-Wound Care Topical Primary Dressing Secondary Dressing Secured With Compression Wrap Compression Stockings Add-Ons Electronic Signature(s) Signed: 11/20/2021 2:00:38 PM By: Kalman Shan DO Signed: 11/20/2021 4:28:42 PM By: Rhae Hammock RN Entered By: Kalman Shan on 11/20/2021 13:51:11 -------------------------------------------------------------------------------- Multi-Disciplinary Care Plan Details Patient Name: Date of Service: [name], Alex BERT W. 11/20/2021 11:30 A  M Medical Record Number: [id number] Patient Account Number: [id number] Date of Birth/Sex: Treating RN: [birth year] (76 y.o. [name], Alex Primary Care Ellinore Merced: London Pepper Other Clinician: Referring Brennley Curtice: Treating Latreshia Beauchaine/Extender: Merilyn Baba in Treatment: 25 [place] reviewed with physician Active Inactive Electronic Signature(s) Signed: 11/20/2021 4:28:42 PM By: Rhae Hammock RN Entered By: Rhae Hammock on 11/20/2021 12:03:39 -------------------------------------------------------------------------------- Pain Assessment Details Patient Name: Date of Service: [name] 11/20/2021 11:30 A M Medical Record Number: [id number] Patient Account Number: [id number] Date of Birth/Sex: Treating RN: [birth year] (76 y.o. [name], Alex Primary Care Alletta Mattos: London Pepper Other Clinician: Referring Shonia Skilling: Treating Nguyet Mercer/Extender: Merilyn Baba in Treatment: 25 Active Problems Location of Pain Severity and Description of Pain Patient Jackson Paino No Site Locations Pain Management and Medication Current Pain Management: Electronic Signature(s) Signed: 11/20/2021 4:28:42 PM By: Rhae Hammock RN Entered By: Rhae Hammock on 11/20/2021 11:42:38 -------------------------------------------------------------------------------- Patient/Caregiver Education Details Patient Name: Date of Service: Bufford Spikes 7/20/2023andnbsp11:30 A M Medical Record Number: [id number] Patient Account Number: [id number] Date of Birth/Gender: Treating RN: [birth year] Jackson (76 y.o. [name] Primary Care Physician: London Pepper Other Clinician: Referring Physician: Treating Physician/Extender: Merilyn Baba in Treatment: 25 Education Assessment Education Provided To: Patient Education Topics Provided Wound/Skin Impairment: Methods:  Explain/Verbal Responses: State content correctly [employer]) Signed: 11/20/2021 4:28:42 PM By: Rhae Hammock RN Entered By: Rhae Hammock on 11/20/2021 12:03:49 -------------------------------------------------------------------------------- Wound Assessment Details Patient Name: Date of Service: [name] 11/20/2021 11:30 A M Medical Record Number: [id number] Patient Account Number: [id number] Date of Birth/Sex: Treating RN: [birth year] (76 y.o. [name], Alex Primary Care Terion Hedman: London Pepper Other Clinician: Referring Elvera Almario: Treating Tymon Nemetz/Extender: Merilyn Baba in Treatment: 25 Wound Status Wound Number: 1 Primary Diabetic Wound/Ulcer of the Lower Extremity Etiology: Wound Location: Left T Great oe Wound Healed - Epithelialized Wounding Event: Blister Status: Date Acquired: 10/02/2020 Comorbid Cataracts, Coronary Artery Disease, Hypertension, Myocardial Weeks Of Treatment: 25 History: Infarction, Type II Diabetes Clustered Wound: No Photos Wound Measurements Length: (cm) Width: (cm) Depth: (cm) Area: (cm) Volume: (cm) 0 % Reduction in Area: 100% 0 % Reduction in Volume: 100% 0 Epithelialization: Large (67-100%) 0 Tunneling: No 0 Undermining: No Wound Description Classification: Grade 3 Wound Margin: Distinct, outline attached  Exudate Amount: Medium Exudate Type: Serosanguineous Exudate Color: red, brown Foul Odor After Cleansing: No Slough/Fibrino No Wound Bed Granulation Amount: Large (67-100%) Exposed Structure Granulation Quality: Red, Pink Fascia Exposed: No Necrotic Amount: None Present (0%) Fat Layer (Subcutaneous Tissue) Exposed: Yes Tendon Exposed: No Muscle Exposed: No Joint Exposed: No Bone Exposed: No Treatment Notes Wound #1 (Toe Great) Wound Laterality: Left Cleanser Peri-Wound Care Topical Primary Dressing Secondary Dressing Secured With Compression  Wrap Compression Stockings Add-Ons Electronic Signature(s) Signed: 11/20/2021 4:28:42 PM By: Rhae Hammock RN Entered By: Rhae Hammock on 11/20/2021 12:05:24 -------------------------------------------------------------------------------- Vitals Details Patient Name: Date of Service: [name], Alex BERT W. 11/20/2021 11:30 A M Medical Record Number: [id number] Patient Account Number: [id number] Date of Birth/Sex: Treating RN: Aug [birth year], Jackson (76 y.o. [name], Alex Primary Care Neythan Kozlov: London Pepper Other Clinician: Donavan Burnet Referring Tiamarie Furnari: Treating Bridger Pizzi/Extender: Merilyn Baba in Treatment: 25 Vital Signs Time Taken: 11:39 Temperature (F): 99.4 Height (in): 71 Pulse (bpm): 96 Weight (lbs): 252 Respiratory Rate (breaths/min): 16 Body Mass Index (BMI): 35.1 Blood Pressure (mmHg): 119/66 Reference Range: 80 - 120 mg / dl Electronic Signature(s) Signed: 11/20/2021 4:28:42 PM By: Rhae Hammock RN Entered By: Rhae Hammock on 11/20/2021 11:39:27

## 2021-11-20 NOTE — Telephone Encounter (Signed)
*  STAT* If patient is at the pharmacy, call can be transferred to refill team.   1. Which medications need to be refilled? (please list name of each medication and dose if known)   Semaglutide,0.25 or 0.5MG /DOS, (OZEMPIC, 0.25 OR 0.5 MG/DOSE,) 2 MG/1.5ML SOPN   2. Which pharmacy/location (including street and city if local pharmacy) is medication to be sent to?  [facility] - [place], [place] - [address] Phone:  [phone]         3. Do they need a 30 day or 90 day supply?  30 day

## 2021-11-20 NOTE — Telephone Encounter (Signed)
Pt c/o medication issue:  1. Name of Medication: OZEMPIC  2. How are you currently taking this medication (dosage and times per day)? Inject into the skin once a week.  3. Are you having a reaction (difficulty breathing--STAT)? no  4. What is your medication issue? [facility] called wanting to know if dosage was decreased from 0.5mg  to 0.25mg .

## 2021-11-20 NOTE — Progress Notes (Signed)
Alex, Jackson ([id number]) Visit Report for 11/20/2021 Chief Complaint Document Details Patient Name: Date of Service: Alex Jackson. 11/20/2021 11:30 A M Medical Record Number: [id number] Patient Account Number: [id number] Date of Birth/Sex: Treating RN: 29-Apr-1946 (76 y.o. Alex Jackson, Alex Jackson Primary Care Provider: [name] Other Clinician: Referring Provider: Treating Provider/Extender: Zena Amos in Treatment: 25 Information Obtained from: Patient Chief Complaint Left great toe wound Electronic Signature(s) Signed: 11/20/2021 2:00:38 PM By: Geralyn Corwin DO Entered By: Geralyn Corwin on 11/20/2021 13:51:23 -------------------------------------------------------------------------------- HPI Details Patient Name: Date of Service: Alex Jackson, [name] 11/20/2021 11:30 A M Medical Record Number: [id number] Patient Account Number: [id number] Date of Birth/Sex: Treating RN: Oct 07, 1945 (76 y.o. [name] Primary Care Provider: [name] Other Clinician: Referring Provider: Treating Provider/Extender: Zena Amos in Treatment: 25 History of Present Illness HPI Description: Admission 05/26/2021 Mr. Alex Jackson is a 76 year old male with a past medical history of controlled type 2 diabetes on oral agents, chronic back pain and coronary artery disease status post stenting to the LAD that presents to the clinic for a 49-month history of nonhealing wound to the left great toe plantar aspect. He has peripheral neuropathy and is not sure how the wound developed. He has been following with Dr. Victorino Dike for this issue and has been using Betadine to the area daily. He does not offload the area. He denies signs of infection. 2/3; patient presents for follow-up. He obtained his left foot x-ray. He has been using gentamicin with hydrofera blue toe to the wound bed with dressing changes. He has no issues or  complaints today. He denies signs of infection. 2/17; patient presents for follow-up. He had his MRI that showed bone marrow edema at the distal aspect concerning for osteomyelitis. Currently patient denies signs of infection. He feels well and has been using gentamicin with Hydrofera Blue to the wound bed. He states that he has ordered custom diabetic shoes. He obtains these at the end of the month. 3/3; patient presents for follow-up. He had a scheduled appointment with Dr. Orvan Falconer, infectious disease yesterday. Patient canceled and at this time does not want to follow-up with any other doctors for this issue. He currently denies signs of infection. He has been using gentamicin ointment with Hydrofera Blue to the wound bed. He has orthopedic shoes and has an area that offloads the left great toe. 3/17; patient presents for follow-up. He reports stubbing his left great toe that has the wound. He has been using gentamicin ointment and Hydrofera Blue with dressing changes. He has orthopedic shoes that offload the left great toe however he is not wearing them today and he confirms that he does not wear these every day. 3/27; patient presents for follow-up. He has his surgical shoe with a cut out to the left great toe. Unfortunately his foot is shifting in the shoe and pressure continues as noted by the drainage location. He continues to use gentamicin ointment with Hydrofera Blue. He denies signs of infection. He reports less drainage to the wound site. 5/5; patient presents for follow-up. Unfortunately the patient did not follow-up as recommended. I have not seen this patient in over 4 weeks. He has no issues or complaints today. He states he is using gentamicin ointment and Hydrofera Blue however the blue dressing is not staying in place. He denies signs of systemic infection. 5/16; patient presents for follow-up. He obtained his Keystone antibiotics and has been using  this daily for the past week.  He reports improvement in wound healing. Due to financial constraints he states he would only be able to follow-up every 3 weeks. 6/15; patient presents for follow-up. He has been using Keystone antibiotic ointment daily. It is unclear if he is able to do this properly since he cannot reach his feet very easily. He has nobody to help him with dressing changes. He had an MRI of his left foot completed on 06/14/2021 that showed changes concerning for osteomyelitis to the left great toe. At that time I had referred him to infectious disease however he did not show up to his appointment and did not want to follow-up at that time. Today he states he would like to see infectious disease. We set up an appointment for him next Thursday at 75 with Dr. Megan Salon. He currently denies signs of infection. He is not able to aggressively offload this area. He is open to doing the total contact cast. 6/23; patient presents for follow-up. He saw Dr. Megan Salon with infectious disease for MRI findings suggesting osteomyelitis in the distal tuft of the left great toe. CRP and sed rate were normal. Patient was started on Bactrim and Augmentin. No active signs of acute infection at this time. Patient has been using Keystone antibiotics. He is open to doing the total contact cast today. 6/26; patient presents for follow-up. Patient has been taking Bactrim and Augmentin without issues. He had a total contact cast placed last Friday and presents for obligatory cast change. He reports no issues with the cast. 6/29; patient presents for follow-up. He presents for his obligatory cast exchange. He had no issues with the cast over the past couple of days. 7/6; patient presents for follow-up. He tolerated the total contact cast well. We have been using Hydrofera Blue although it appears its not staying well in place after the cast was removed. He denies signs of infection. 7/13; patient presents for follow-up. He has tolerated the  caudal contact cast well for the past week. We have been using Keystone antibiotic and silver alginate under the cast. He currently denies signs of infection. 7/20; patient presents for follow-up. Patient tolerated the total contact cast for the past week. He declines having this replaced today. We have been using PolyMem silver under the total contact cast. Electronic Signature(s) Signed: 11/20/2021 2:00:38 PM By: Kalman Shan DO Entered By: Kalman Shan on 11/20/2021 13:52:20 -------------------------------------------------------------------------------- Physical Exam Details Patient Name: Date of Service: Alex [place] W. 11/20/2021 11:30 A M Medical Record Number: [id number] Patient Account Number: [id number] Date of Birth/Sex: Treating RN: 1945-06-24 (76 y.o. [name] Primary Care Provider: London Pepper Other Clinician: Referring Provider: Treating Provider/Extender: Merilyn Baba in Treatment: 25 Constitutional respirations regular, non-labored and within target range for patient.. Cardiovascular 2+ dorsalis pedis/posterior tibialis pulses. Psychiatric pleasant and cooperative. Notes Left great toe: T the plantar aspect there is epithelization to the previous wound site. o Electronic Signature(s) Signed: 11/20/2021 2:00:38 PM By: Kalman Shan DO Entered By: Kalman Shan on 11/20/2021 13:52:58 -------------------------------------------------------------------------------- Physician Orders Details Patient Name: Date of Service: Alex Jackson, [name] 11/20/2021 11:30 A M Medical Record Number: [id number] Patient Account Number: [id number] Date of Birth/Sex: Treating RN: 18-Aug-1945 (76 y.o. [name] Primary Care Provider: London Pepper Other Clinician: Referring Provider: Treating Provider/Extender: Merilyn Baba in Treatment: 25 Verbal / Phone Orders: No Diagnosis Coding Follow-up  Appointments ppointment in 2 weeks. - 12/04/21 @  14 w/ Dr. Heber [place] and Allayne Butcher Room # 9 Return A Edema Control - Lymphedema / SCD / Other Elevate legs to the level of the heart or above for 30 minutes daily and/or when sitting, a frequency of: Avoid standing for long periods of time. Off-Loading Other: - use foam donut for offloading of your toe. Electronic Signature(s) Signed: 11/20/2021 2:00:38 PM By: Kalman Shan DO Entered By: Kalman Shan on 11/20/2021 13:53:08 -------------------------------------------------------------------------------- Problem List Details Patient Name: Date of Service: Alex Jackson, [name] 11/20/2021 11:30 A M Medical Record Number: [id number] Patient Account Number: [id number] Date of Birth/Sex: Treating RN: February 21, 1946 (76 y.o. Alex Jackson, Alex Jackson Primary Care Provider: London Pepper Other Clinician: Referring Provider: Treating Provider/Extender: Merilyn Baba in Treatment: 25 Active Problems ICD-10 Encounter Code Description Active Date MDM Diagnosis [phone] Non-pressure chronic ulcer of other part of left foot with fat layer exposed 05/26/2021 No Yes E11.621 Type 2 diabetes mellitus with foot ulcer 05/26/2021 No Yes Z95.5 Presence of coronary angioplasty implant and graft 05/26/2021 No Yes E78.5 Hyperlipidemia, unspecified 05/26/2021 No Yes M54.50 Low back pain, unspecified 05/26/2021 No Yes Inactive Problems Resolved Problems Electronic Signature(s) Signed: 11/20/2021 2:00:38 PM By: Kalman Shan DO Entered By: Kalman Shan on 11/20/2021 13:51:04 -------------------------------------------------------------------------------- Progress Note Details Patient Name: Date of Service: Alex Jackson, [name] 11/20/2021 11:30 A M Medical Record Number: [id number] Patient Account Number: [id number] Date of Birth/Sex: Treating RN: 1945/05/24 (76 y.o. Alex Jackson, Alex Jackson Primary Care Provider: London Pepper Other  Clinician: Referring Provider: Treating Provider/Extender: Merilyn Baba in Treatment: 25 Subjective Chief Complaint Information obtained from Patient Left great toe wound History of Present Illness (HPI) Admission 05/26/2021 Mr. Jamarrie Angello is a 76 year old male with a past medical history of controlled type 2 diabetes on oral agents, chronic back pain and coronary artery disease status post stenting to the LAD that presents to the clinic for a 94-month history of nonhealing wound to the left great toe plantar aspect. He has peripheral neuropathy and is not sure how the wound developed. He has been following with Dr. Doran Durand for this issue and has been using Betadine to the area daily. He does not offload the area. He denies signs of infection. 2/3; patient presents for follow-up. He obtained his left foot x-ray. He has been using gentamicin with hydrofera blue toe to the wound bed with dressing changes. He has no issues or complaints today. He denies signs of infection. 2/17; patient presents for follow-up. He had his MRI that showed bone marrow edema at the distal aspect concerning for osteomyelitis. Currently patient denies signs of infection. He feels well and has been using gentamicin with Hydrofera Blue to the wound bed. He states that he has ordered custom diabetic shoes. He obtains these at the end of the month. 3/3; patient presents for follow-up. He had a scheduled appointment with Dr. Megan Salon, infectious disease yesterday. Patient canceled and at this time does not want to follow-up with any other doctors for this issue. He currently denies signs of infection. He has been using gentamicin ointment with Hydrofera Blue to the wound bed. He has orthopedic shoes and has an area that offloads the left great toe. 3/17; patient presents for follow-up. He reports stubbing his left great toe that has the wound. He has been using gentamicin ointment and Hydrofera Blue  with dressing changes. He has orthopedic shoes that offload the left great toe however he is not wearing them today and he confirms  that he does not wear these every day. 3/27; patient presents for follow-up. He has his surgical shoe with a cut out to the left great toe. Unfortunately his foot is shifting in the shoe and pressure continues as noted by the drainage location. He continues to use gentamicin ointment with Hydrofera Blue. He denies signs of infection. He reports less drainage to the wound site. 5/5; patient presents for follow-up. Unfortunately the patient did not follow-up as recommended. I have not seen this patient in over 4 weeks. He has no issues or complaints today. He states he is using gentamicin ointment and Hydrofera Blue however the blue dressing is not staying in place. He denies signs of systemic infection. 5/16; patient presents for follow-up. He obtained his Keystone antibiotics and has been using this daily for the past week. He reports improvement in wound healing. Due to financial constraints he states he would only be able to follow-up every 3 weeks. 6/15; patient presents for follow-up. He has been using Keystone antibiotic ointment daily. It is unclear if he is able to do this properly since he cannot reach his feet very easily. He has nobody to help him with dressing changes. He had an MRI of his left foot completed on 06/14/2021 that showed changes concerning for osteomyelitis to the left great toe. At that time I had referred him to infectious disease however he did not show up to his appointment and did not want to follow-up at that time. Today he states he would like to see infectious disease. We set up an appointment for him next Thursday at 930 with Dr. Orvan Falconer. He currently denies signs of infection. He is not able to aggressively offload this area. He is open to doing the total contact cast. 6/23; patient presents for follow-up. He saw Dr. Orvan Falconer with  infectious disease for MRI findings suggesting osteomyelitis in the distal tuft of the left great toe. CRP and sed rate were normal. Patient was started on Bactrim and Augmentin. No active signs of acute infection at this time. Patient has been using Keystone antibiotics. He is open to doing the total contact cast today. 6/26; patient presents for follow-up. Patient has been taking Bactrim and Augmentin without issues. He had a total contact cast placed last Friday and presents for obligatory cast change. He reports no issues with the cast. 6/29; patient presents for follow-up. He presents for his obligatory cast exchange. He had no issues with the cast over the past couple of days. 7/6; patient presents for follow-up. He tolerated the total contact cast well. We have been using Hydrofera Blue although it appears its not staying well in place after the cast was removed. He denies signs of infection. 7/13; patient presents for follow-up. He has tolerated the caudal contact cast well for the past week. We have been using Keystone antibiotic and silver alginate under the cast. He currently denies signs of infection. 7/20; patient presents for follow-up. Patient tolerated the total contact cast for the past week. He declines having this replaced today. We have been using PolyMem silver under the total contact cast. Patient History Information obtained from Patient. Family History Cancer - Father,Mother, Hypertension - Mother, No family history of Diabetes, Heart Disease, Hereditary Spherocytosis, Kidney Disease, Lung Disease, Seizures, Stroke, Thyroid Problems, Tuberculosis. Social History Former smoker - quit 1985, Alcohol Use - Daily - wine or scotch, Drug Use - No History, Caffeine Use - Never. Medical History Eyes Patient has history of Cataracts - removed Denies history  of Glaucoma, Optic Neuritis Ear/Nose/Mouth/Throat Denies history of Chronic sinus problems/congestion, Middle ear  problems Hematologic/Lymphatic Denies history of Hemophilia, Human Immunodeficiency Virus, Lymphedema, Sickle Cell Disease Respiratory Denies history of Aspiration, Asthma, Chronic Obstructive Pulmonary Disease (COPD), Pneumothorax, Sleep Apnea, Tuberculosis Cardiovascular Patient has history of Coronary Artery Disease, Hypertension, Myocardial Infarction - NSTEMI Denies history of Angina, Arrhythmia, Congestive Heart Failure, Deep Vein Thrombosis, Hypotension, Peripheral Arterial Disease, Peripheral Venous Disease, Phlebitis, Vasculitis Gastrointestinal Denies history of Cirrhosis , Colitis, Crohnoos, Hepatitis A, Hepatitis B, Hepatitis C Endocrine Patient has history of Type II Diabetes - PCP notes type II; per patient takes metformin been told prediabetic Denies history of Type I Diabetes Genitourinary Denies history of End Stage Renal Disease Immunological Denies history of Lupus Erythematosus, Raynaudoos, Scleroderma Integumentary (Skin) Denies history of History of Burn Musculoskeletal Denies history of Gout, Rheumatoid Arthritis, Osteoarthritis, Osteomyelitis Neurologic Denies history of Dementia, Neuropathy, Quadriplegia, Paraplegia, Seizure Disorder Oncologic Denies history of Received Chemotherapy, Received Radiation Psychiatric Denies history of Anorexia/bulimia, Confinement Anxiety Hospitalization/Surgery History - stents MRI 2017and2019. - torn meniscus left 5 weeks ago. Medical A Surgical History Notes nd Constitutional Symptoms (General Health) Back pain x6 months Cardiovascular hyperlipidemia Genitourinary Elevated PSA Objective Constitutional respirations regular, non-labored and within target range for patient.. Vitals Time Taken: 11:39 AM, Height: 71 in, Weight: 252 lbs, BMI: 35.1, Temperature: 99.4 F, Pulse: 96 bpm, Respiratory Rate: 16 breaths/min, Blood Pressure: 119/66 mmHg. Cardiovascular 2+ dorsalis pedis/posterior tibialis  pulses. Psychiatric pleasant and cooperative. General Notes: Left great toe: T the plantar aspect there is epithelization to the previous wound site. o Integumentary (Hair, Skin) Wound #1 status is Healed - Epithelialized. Original cause of wound was Blister. The date acquired was: 10/02/2020. The wound has been in treatment 25 weeks. The wound is located on the Left T Great. The wound measures 0cm length x 0cm width x 0cm depth; 0cm^2 area and 0cm^3 volume. There is Fat Layer oe (Subcutaneous Tissue) exposed. There is no tunneling or undermining noted. There is a medium amount of serosanguineous drainage noted. The wound margin is distinct with the outline attached to the wound base. There is large (67-100%) red, pink granulation within the wound bed. There is no necrotic tissue within the wound bed. Assessment Active Problems ICD-10 Non-pressure chronic ulcer of other part of left foot with fat layer exposed Type 2 diabetes mellitus with foot ulcer Presence of coronary angioplasty implant and graft Hyperlipidemia, unspecified Low back pain, unspecified Patient has done well with PolyMem silver under the total contact cast. His wound has closed however since this is fairly new he is at high risk of opening it. I recommended doing the total contact cast for 1 more week. Patient declined this. He does have orthotics. For now I recommended using a foam donut to keep the area protected. He will follow-up in 2 weeks to assure that this is not reopened. Plan Follow-up Appointments: Return Appointment in 2 weeks. - 12/04/21 @ 0915 w/ Dr. Heber [place] and Allayne Butcher Room # 9 Edema Control - Lymphedema / SCD / Other: Elevate legs to the level of the heart or above for 30 minutes daily and/or when sitting, a frequency of: Avoid standing for long periods of time. Off-Loading: Other: - use foam donut for offloading of your toe. 1. Follow-up in 2 weeks 2. Use orthotics and offloading donut Electronic  Signature(s) Signed: 11/20/2021 2:00:38 PM By: Kalman Shan DO Entered By: Kalman Shan on 11/20/2021 13:57:29 -------------------------------------------------------------------------------- HxROS Details Patient Name: Date of Service: Alex Jackson,  [name] 11/20/2021 11:30 A M Medical Record Number: [id number] Patient Account Number: [id number] Date of Birth/Sex: Treating RN: 1945-10-15 (76 y.o. Alex Jackson, Alex Jackson Primary Care Provider: London Pepper Other Clinician: Referring Provider: Treating Provider/Extender: Merilyn Baba in Treatment: 25 Information Obtained From Patient Constitutional Symptoms (General Health) Medical History: Past Medical History Notes: Back pain x6 months Eyes Medical History: Positive for: Cataracts - removed Negative for: Glaucoma; Optic Neuritis Ear/Nose/Mouth/Throat Medical History: Negative for: Chronic sinus problems/congestion; Middle ear problems Hematologic/Lymphatic Medical History: Negative for: Hemophilia; Human Immunodeficiency Virus; Lymphedema; Sickle Cell Disease Respiratory Medical History: Negative for: Aspiration; Asthma; Chronic Obstructive Pulmonary Disease (COPD); Pneumothorax; Sleep Apnea; Tuberculosis Cardiovascular Medical History: Positive for: Coronary Artery Disease; Hypertension; Myocardial Infarction - NSTEMI Negative for: Angina; Arrhythmia; Congestive Heart Failure; Deep Vein Thrombosis; Hypotension; Peripheral Arterial Disease; Peripheral Venous Disease; Phlebitis; Vasculitis Past Medical History Notes: hyperlipidemia Gastrointestinal Medical History: Negative for: Cirrhosis ; Colitis; Crohns; Hepatitis A; Hepatitis B; Hepatitis C Endocrine Medical History: Positive for: Type II Diabetes - PCP notes type II; per patient takes metformin been told prediabetic Negative for: Type I Diabetes Time with diabetes: prediabetic 3-4 years per pt Treated with: Oral agents, Diet Blood  sugar tested every day: No Genitourinary Medical History: Negative for: End Stage Renal Disease Past Medical History Notes: Elevated PSA Immunological Medical History: Negative for: Lupus Erythematosus; Raynauds; Scleroderma Integumentary (Skin) Medical History: Negative for: History of Burn Musculoskeletal Medical History: Negative for: Gout; Rheumatoid Arthritis; Osteoarthritis; Osteomyelitis Neurologic Medical History: Negative for: Dementia; Neuropathy; Quadriplegia; Paraplegia; Seizure Disorder Oncologic Medical History: Negative for: Received Chemotherapy; Received Radiation Psychiatric Medical History: Negative for: Anorexia/bulimia; Confinement Anxiety HBO Extended History Items Eyes: Cataracts Immunizations Pneumococcal Vaccine: Received Pneumococcal Vaccination: Yes Received Pneumococcal Vaccination On or After 60th Birthday: Yes Implantable Devices No devices added Hospitalization / Surgery History Type of Hospitalization/Surgery stents MRI 2017and2019 torn meniscus left 5 weeks ago Family and Social History Cancer: Yes - Father,Mother; Diabetes: No; Heart Disease: No; Hereditary Spherocytosis: No; Hypertension: Yes - Mother; Kidney Disease: No; Lung Disease: No; Seizures: No; Stroke: No; Thyroid Problems: No; Tuberculosis: No; Former smoker - quit 1985; Alcohol Use: Daily - wine or scotch; Drug Use: No History; Caffeine Use: Never; Financial Concerns: No; Food, Clothing or Shelter Needs: No; Support System Lacking: No; Transportation Concerns: No Electronic Signature(s) Signed: 11/20/2021 2:00:38 PM By: Kalman Shan DO Signed: 11/20/2021 4:28:42 PM By: Rhae Hammock RN Entered By: Kalman Shan on 11/20/2021 13:52:28 -------------------------------------------------------------------------------- SuperBill Details Patient Name: Date of Service: Alex Jackson. 11/20/2021 Medical Record Number: [id number] Patient Account Number:  [id number] Date of Birth/Sex: Treating RN: [birth year] (76 y.o. Alex Jackson, Alex Jackson Primary Care Provider: London Pepper Other Clinician: Referring Provider: Treating Provider/Extender: Merilyn Baba in Treatment: 25 Diagnosis Coding ICD-10 Codes Code Description [phone] Non-pressure chronic ulcer of other part of left foot with fat layer exposed E11.621 Type 2 diabetes mellitus with foot ulcer Z95.5 Presence of coronary angioplasty implant and graft E78.5 Hyperlipidemia, unspecified M54.50 Low back pain, unspecified Facility Procedures CPT4 Code: [id number] Description: 99213 - WOUND CARE VISIT-LEV 3 EST PT Modifier: Quantity: 1 Physician Procedures : CPT4 Code Description Modifier [id number] - WC PHYS LEVEL 3 - EST PT ICD-10 Diagnosis Description L97.522 Non-pressure chronic ulcer of other part of left foot with fat layer exposed E11.621 Type 2 diabetes mellitus with foot ulcer Z95.5 Presence of  coronary angioplasty implant and graft M54.50 Low back pain, unspecified Quantity: 1 Electronic Signature(s) Signed: 11/20/2021 2:00:38  PM By: Kalman Shan DO Entered By: Kalman Shan on 11/20/2021 13:58:01

## 2021-11-24 ENCOUNTER — Other Ambulatory Visit: Payer: Self-pay | Admitting: Urology

## 2021-11-24 DIAGNOSIS — R972 Elevated prostate specific antigen [PSA]: Secondary | ICD-10-CM | POA: Diagnosis not present

## 2021-11-24 DIAGNOSIS — R351 Nocturia: Secondary | ICD-10-CM | POA: Diagnosis not present

## 2021-11-25 DIAGNOSIS — M5117 Intervertebral disc disorders with radiculopathy, lumbosacral region: Secondary | ICD-10-CM | POA: Diagnosis not present

## 2021-11-25 DIAGNOSIS — M25651 Stiffness of right hip, not elsewhere classified: Secondary | ICD-10-CM | POA: Diagnosis not present

## 2021-11-25 DIAGNOSIS — M25652 Stiffness of left hip, not elsewhere classified: Secondary | ICD-10-CM | POA: Diagnosis not present

## 2021-11-25 DIAGNOSIS — R262 Difficulty in walking, not elsewhere classified: Secondary | ICD-10-CM | POA: Diagnosis not present

## 2021-11-26 ENCOUNTER — Other Ambulatory Visit: Payer: Self-pay

## 2021-11-26 ENCOUNTER — Encounter: Payer: Self-pay | Admitting: Internal Medicine

## 2021-11-26 ENCOUNTER — Ambulatory Visit (INDEPENDENT_AMBULATORY_CARE_PROVIDER_SITE_OTHER): Payer: PPO | Admitting: Internal Medicine

## 2021-11-26 DIAGNOSIS — M86672 Other chronic osteomyelitis, left ankle and foot: Secondary | ICD-10-CM

## 2021-11-26 NOTE — Progress Notes (Signed)
Virtual Visit via Telephone Note  I connected with Alex Jackson on 11/26/21 at  9:30 AM EDT by telephone and verified that I am speaking with the correct person using two identifiers.  Location: Patient: Home Provider: [facility]   I discussed the limitations, risks, security and privacy concerns of performing an evaluation and management service by telephone and the availability of in person appointments. I also discussed with the patient that there may be a patient responsible charge related to this service. The patient expressed understanding and agreed to proceed.   History of Present Illness: I called and spoke with Nadine Counts today.  He has now completed about 5 weeks of antibiotic therapy for his left great toe osteomyelitis.  He was seen at the wound center last week and the ulcer on the plantar surface of his left great toe was healing nicely.   Observations/Objective:    Assessment and Plan: I am hopeful that his osteomyelitis in the distal tuft of his left great toe has been cured.  Follow Up Instructions: He will discontinue amoxicillin/clavulanate and trimethoprim sulfamethoxazole now. Phone follow-up in 4 weeks   I discussed the assessment and treatment plan with the patient. The patient was provided an opportunity to ask questions and all were answered. The patient agreed with the plan and demonstrated an understanding of the instructions.   The patient was advised to call back or seek an in-person evaluation if the symptoms worsen or if the condition fails to improve as anticipated.  I provided 14 minutes of non-face-to-face time during this encounter.   Cliffton Asters, MD

## 2021-11-27 DIAGNOSIS — R262 Difficulty in walking, not elsewhere classified: Secondary | ICD-10-CM | POA: Diagnosis not present

## 2021-11-27 DIAGNOSIS — M25652 Stiffness of left hip, not elsewhere classified: Secondary | ICD-10-CM | POA: Diagnosis not present

## 2021-11-27 DIAGNOSIS — M25651 Stiffness of right hip, not elsewhere classified: Secondary | ICD-10-CM | POA: Diagnosis not present

## 2021-11-27 DIAGNOSIS — M5117 Intervertebral disc disorders with radiculopathy, lumbosacral region: Secondary | ICD-10-CM | POA: Diagnosis not present

## 2021-12-02 DIAGNOSIS — M5117 Intervertebral disc disorders with radiculopathy, lumbosacral region: Secondary | ICD-10-CM | POA: Diagnosis not present

## 2021-12-02 DIAGNOSIS — M25652 Stiffness of left hip, not elsewhere classified: Secondary | ICD-10-CM | POA: Diagnosis not present

## 2021-12-02 DIAGNOSIS — M25651 Stiffness of right hip, not elsewhere classified: Secondary | ICD-10-CM | POA: Diagnosis not present

## 2021-12-02 DIAGNOSIS — R262 Difficulty in walking, not elsewhere classified: Secondary | ICD-10-CM | POA: Diagnosis not present

## 2021-12-04 ENCOUNTER — Encounter (HOSPITAL_BASED_OUTPATIENT_CLINIC_OR_DEPARTMENT_OTHER): Payer: PPO | Attending: Internal Medicine | Admitting: Internal Medicine

## 2021-12-04 DIAGNOSIS — G8929 Other chronic pain: Secondary | ICD-10-CM | POA: Insufficient documentation

## 2021-12-04 DIAGNOSIS — L97522 Non-pressure chronic ulcer of other part of left foot with fat layer exposed: Secondary | ICD-10-CM | POA: Diagnosis not present

## 2021-12-04 DIAGNOSIS — Z955 Presence of coronary angioplasty implant and graft: Secondary | ICD-10-CM | POA: Diagnosis not present

## 2021-12-04 DIAGNOSIS — M5117 Intervertebral disc disorders with radiculopathy, lumbosacral region: Secondary | ICD-10-CM | POA: Diagnosis not present

## 2021-12-04 DIAGNOSIS — E1142 Type 2 diabetes mellitus with diabetic polyneuropathy: Secondary | ICD-10-CM | POA: Diagnosis not present

## 2021-12-04 DIAGNOSIS — Z87891 Personal history of nicotine dependence: Secondary | ICD-10-CM | POA: Insufficient documentation

## 2021-12-04 DIAGNOSIS — M545 Low back pain, unspecified: Secondary | ICD-10-CM | POA: Diagnosis not present

## 2021-12-04 DIAGNOSIS — E11621 Type 2 diabetes mellitus with foot ulcer: Secondary | ICD-10-CM | POA: Diagnosis not present

## 2021-12-04 DIAGNOSIS — E785 Hyperlipidemia, unspecified: Secondary | ICD-10-CM | POA: Insufficient documentation

## 2021-12-04 DIAGNOSIS — R262 Difficulty in walking, not elsewhere classified: Secondary | ICD-10-CM | POA: Diagnosis not present

## 2021-12-04 DIAGNOSIS — M25652 Stiffness of left hip, not elsewhere classified: Secondary | ICD-10-CM | POA: Diagnosis not present

## 2021-12-04 DIAGNOSIS — Z5986 Financial insecurity: Secondary | ICD-10-CM | POA: Insufficient documentation

## 2021-12-04 DIAGNOSIS — M25651 Stiffness of right hip, not elsewhere classified: Secondary | ICD-10-CM | POA: Diagnosis not present

## 2021-12-04 NOTE — Progress Notes (Signed)
POWER, ARNET ([id number]) Visit Report for 12/04/2021 Chief Complaint Document Details Patient Name: Date of Service: Alex Jackson. 12/04/2021 9:15 A M Medical Record Number: [id number] Patient Account Number: [id number] Date of Birth/Sex: Treating RN: 11/19/1945 (76 y.o. M) Primary Care Provider: London Pepper Other Clinician: Referring Provider: Treating Provider/Extender: Merilyn Baba in Treatment: 27 Information Obtained from: Patient Chief Complaint Left great toe wound Electronic Signature(s) Signed: 12/04/2021 12:24:18 PM By: Kalman Shan DO Entered By: Kalman Shan on 12/04/2021 10:14:50 -------------------------------------------------------------------------------- HPI Details Patient Name: Date of Service: Alex Jackson, Alex BERT W. 12/04/2021 9:15 A M Medical Record Number: [id number] Patient Account Number: [id number] Date of Birth/Sex: Treating RN: February 28, 1946 (76 y.o. M) Primary Care Provider: London Pepper Other Clinician: Referring Provider: Treating Provider/Extender: Merilyn Baba in Treatment: 27 History of Present Illness HPI Description: Admission 05/26/2021 Alex Jackson is a 76 year old male with a past medical history of controlled type 2 diabetes on oral agents, chronic back pain and coronary artery disease status post stenting to the LAD that presents to the clinic for a 41-month history of nonhealing wound to the left great toe plantar aspect. He has peripheral neuropathy and is not sure how the wound developed. He has been following with Dr. Doran Durand for this issue and has been using Betadine to the area daily. He does not offload the area. He denies signs of infection. 2/3; patient presents for follow-up. He obtained his left foot x-ray. He has been using gentamicin with hydrofera blue toe to the wound bed with dressing changes. He has no issues or complaints today. He denies signs of  infection. 2/17; patient presents for follow-up. He had his MRI that showed bone marrow edema at the distal aspect concerning for osteomyelitis. Currently patient denies signs of infection. He feels well and has been using gentamicin with Hydrofera Blue to the wound bed. He states that he has ordered custom diabetic shoes. He obtains these at the end of the month. 3/3; patient presents for follow-up. He had a scheduled appointment with Dr. Megan Salon, infectious disease yesterday. Patient canceled and at this time does not want to follow-up with any other doctors for this issue. He currently denies signs of infection. He has been using gentamicin ointment with Hydrofera Blue to the wound bed. He has orthopedic shoes and has an area that offloads the left great toe. 3/17; patient presents for follow-up. He reports stubbing his left great toe that has the wound. He has been using gentamicin ointment and Hydrofera Blue with dressing changes. He has orthopedic shoes that offload the left great toe however he is not wearing them today and he confirms that he does not wear these every day. 3/27; patient presents for follow-up. He has his surgical shoe with a cut out to the left great toe. Unfortunately his foot is shifting in the shoe and pressure continues as noted by the drainage location. He continues to use gentamicin ointment with Hydrofera Blue. He denies signs of infection. He reports less drainage to the wound site. 5/5; patient presents for follow-up. Unfortunately the patient did not follow-up as recommended. I have not seen this patient in over 4 weeks. He has no issues or complaints today. He states he is using gentamicin ointment and Hydrofera Blue however the blue dressing is not staying in place. He denies signs of systemic infection. 5/16; patient presents for follow-up. He obtained his Keystone antibiotics and has been using this daily for the  past week. He reports improvement in  wound healing. Due to financial constraints he states he would only be able to follow-up every 3 weeks. 6/15; patient presents for follow-up. He has been using Keystone antibiotic ointment daily. It is unclear if he is able to do this properly since he cannot reach his feet very easily. He has nobody to help him with dressing changes. He had an MRI of his left foot completed on 06/14/2021 that showed changes concerning for osteomyelitis to the left great toe. At that time I had referred him to infectious disease however he did not show up to his appointment and did not want to follow-up at that time. Today he states he would like to see infectious disease. We set up an appointment for him next Thursday at 930 with Dr. Orvan Falconer. He currently denies signs of infection. He is not able to aggressively offload this area. He is open to doing the total contact cast. 6/23; patient presents for follow-up. He saw Dr. Orvan Falconer with infectious disease for MRI findings suggesting osteomyelitis in the distal tuft of the left great toe. CRP and sed rate were normal. Patient was started on Bactrim and Augmentin. No active signs of acute infection at this time. Patient has been using Keystone antibiotics. He is open to doing the total contact cast today. 6/26; patient presents for follow-up. Patient has been taking Bactrim and Augmentin without issues. He had a total contact cast placed last Friday and presents for obligatory cast change. He reports no issues with the cast. 6/29; patient presents for follow-up. He presents for his obligatory cast exchange. He had no issues with the cast over the past couple of days. 7/6; patient presents for follow-up. He tolerated the total contact cast well. We have been using Hydrofera Blue although it appears its not staying well in place after the cast was removed. He denies signs of infection. 7/13; patient presents for follow-up. He has tolerated the caudal contact cast well for  the past week. We have been using Keystone antibiotic and silver alginate under the cast. He currently denies signs of infection. 7/20; patient presents for follow-up. Patient tolerated the total contact cast for the past week. He declines having this replaced today. We have been using PolyMem silver under the total contact cast. 8/3; patient presents for follow-up. He has been using a foam donut offloading pad T his left great toe. He reports that his wound has remained closed. He o has orthotics. He has no issues or complaints today. Electronic Signature(s) Signed: 12/04/2021 12:24:18 PM By: Geralyn Corwin DO Entered By: Geralyn Corwin on 12/04/2021 10:15:56 -------------------------------------------------------------------------------- Physical Exam Details Patient Name: Date of Service: Alex Carp W. 12/04/2021 9:15 A M Medical Record Number: [id number] Patient Account Number: [id number] Date of Birth/Sex: Treating RN: 04/11/1946 (77 y.o. M) Primary Care Provider: [name] Other Clinician: Referring Provider: Treating Provider/Extender: Zena Amos in Treatment: 27 Constitutional respirations regular, non-labored and within target range for patient.. Cardiovascular 2+ dorsalis pedis/posterior tibialis pulses. Psychiatric pleasant and cooperative. Notes Left great toe: T the plantar aspect there is epithelization to the previous wound site. o Electronic Signature(s) Signed: 12/04/2021 12:24:18 PM By: Geralyn Corwin DO Entered By: Geralyn Corwin on 12/04/2021 10:16:16 -------------------------------------------------------------------------------- Physician Orders Details Patient Name: Date of Service: Alex Jackson, Alex BERT W. 12/04/2021 9:15 A M Medical Record Number: [id number] Patient Account Number: [id number] Date of Birth/Sex: Treating RN: 08/27/1945 (76 y.o. M) Primary Care Provider: Other Clinician: Kateri Plummer,  Marjory Lies Referring  Provider: Treating Provider/Extender: Merilyn Baba in Treatment: 27 Verbal / Phone Orders: No Diagnosis Coding ICD-10 Coding Code Description [phone] Non-pressure chronic ulcer of other part of left foot with fat layer exposed E11.621 Type 2 diabetes mellitus with foot ulcer Z95.5 Presence of coronary angioplasty implant and graft E78.5 Hyperlipidemia, unspecified M54.50 Low back pain, unspecified Discharge From [facility] Discharge from [place] Signature(s) Signed: 12/04/2021 12:24:18 PM By: Kalman Shan DO Signed: 12/04/2021 3:56:15 PM By: Rhae Hammock RN Entered By: Rhae Hammock on 12/04/2021 11:07:32 -------------------------------------------------------------------------------- Problem List Details Patient Name: Date of Service: Alex Jackson, Alex BERT W. 12/04/2021 9:15 A M Medical Record Number: [id number] Patient Account Number: [id number] Date of Birth/Sex: Treating RN: [birth year] (76 y.o. M) Primary Care Provider: London Pepper Other Clinician: Referring Provider: Treating Provider/Extender: Merilyn Baba in Treatment: 27 Active Problems ICD-10 Encounter Code Description Active Date MDM Diagnosis L97.522 Non-pressure chronic ulcer of other part of left foot with fat layer exposed 05/26/2021 No Yes E11.621 Type 2 diabetes mellitus with foot ulcer 05/26/2021 No Yes Z95.5 Presence of coronary angioplasty implant and graft 05/26/2021 No Yes E78.5 Hyperlipidemia, unspecified 05/26/2021 No Yes M54.50 Low back pain, unspecified 05/26/2021 No Yes Inactive Problems Resolved Problems Electronic Signature(s) Signed: 12/04/2021 12:24:18 PM By: Kalman Shan DO Entered By: Kalman Shan on 12/04/2021 10:14:30 -------------------------------------------------------------------------------- Progress Note Details Patient Name: Date of Service: Alex Jackson, Alex BERT W. 12/04/2021 9:15 A M Medical Record  Number: [id number] Patient Account Number: [id number] Date of Birth/Sex: Treating RN: March 26, 1946 (76 y.o. M) Primary Care Provider: London Pepper Other Clinician: Referring Provider: Treating Provider/Extender: Merilyn Baba in Treatment: 27 Subjective Chief Complaint Information obtained from Patient Left great toe wound History of Present Illness (HPI) Admission 05/26/2021 Mr. Alex Jackson is a 76 year old male with a past medical history of controlled type 2 diabetes on oral agents, chronic back pain and coronary artery disease status post stenting to the LAD that presents to the clinic for a 64-month history of nonhealing wound to the left great toe plantar aspect. He has peripheral neuropathy and is not sure how the wound developed. He has been following with Dr. Doran Durand for this issue and has been using Betadine to the area daily. He does not offload the area. He denies signs of infection. 2/3; patient presents for follow-up. He obtained his left foot x-ray. He has been using gentamicin with hydrofera blue toe to the wound bed with dressing changes. He has no issues or complaints today. He denies signs of infection. 2/17; patient presents for follow-up. He had his MRI that showed bone marrow edema at the distal aspect concerning for osteomyelitis. Currently patient denies signs of infection. He feels well and has been using gentamicin with Hydrofera Blue to the wound bed. He states that he has ordered custom diabetic shoes. He obtains these at the end of the month. 3/3; patient presents for follow-up. He had a scheduled appointment with Dr. Megan Salon, infectious disease yesterday. Patient canceled and at this time does not want to follow-up with any other doctors for this issue. He currently denies signs of infection. He has been using gentamicin ointment with Hydrofera Blue to the wound bed. He has orthopedic shoes and has an area that offloads the left great  toe. 3/17; patient presents for follow-up. He reports stubbing his left great toe that has the wound. He has been using gentamicin ointment and Hydrofera Blue with dressing changes. He  has orthopedic shoes that offload the left great toe however he is not wearing them today and he confirms that he does not wear these every day. 3/27; patient presents for follow-up. He has his surgical shoe with a cut out to the left great toe. Unfortunately his foot is shifting in the shoe and pressure continues as noted by the drainage location. He continues to use gentamicin ointment with Hydrofera Blue. He denies signs of infection. He reports less drainage to the wound site. 5/5; patient presents for follow-up. Unfortunately the patient did not follow-up as recommended. I have not seen this patient in over 4 weeks. He has no issues or complaints today. He states he is using gentamicin ointment and Hydrofera Blue however the blue dressing is not staying in place. He denies signs of systemic infection. 5/16; patient presents for follow-up. He obtained his Keystone antibiotics and has been using this daily for the past week. He reports improvement in wound healing. Due to financial constraints he states he would only be able to follow-up every 3 weeks. 6/15; patient presents for follow-up. He has been using Keystone antibiotic ointment daily. It is unclear if he is able to do this properly since he cannot reach his feet very easily. He has nobody to help him with dressing changes. He had an MRI of his left foot completed on 06/14/2021 that showed changes concerning for osteomyelitis to the left great toe. At that time I had referred him to infectious disease however he did not show up to his appointment and did not want to follow-up at that time. Today he states he would like to see infectious disease. We set up an appointment for him next Thursday at 20 with Dr. Megan Salon. He currently denies signs of infection. He  is not able to aggressively offload this area. He is open to doing the total contact cast. 6/23; patient presents for follow-up. He saw Dr. Megan Salon with infectious disease for MRI findings suggesting osteomyelitis in the distal tuft of the left great toe. CRP and sed rate were normal. Patient was started on Bactrim and Augmentin. No active signs of acute infection at this time. Patient has been using Keystone antibiotics. He is open to doing the total contact cast today. 6/26; patient presents for follow-up. Patient has been taking Bactrim and Augmentin without issues. He had a total contact cast placed last Friday and presents for obligatory cast change. He reports no issues with the cast. 6/29; patient presents for follow-up. He presents for his obligatory cast exchange. He had no issues with the cast over the past couple of days. 7/6; patient presents for follow-up. He tolerated the total contact cast well. We have been using Hydrofera Blue although it appears its not staying well in place after the cast was removed. He denies signs of infection. 7/13; patient presents for follow-up. He has tolerated the caudal contact cast well for the past week. We have been using Keystone antibiotic and silver alginate under the cast. He currently denies signs of infection. 7/20; patient presents for follow-up. Patient tolerated the total contact cast for the past week. He declines having this replaced today. We have been using PolyMem silver under the total contact cast. 8/3; patient presents for follow-up. He has been using a foam donut offloading pad T his left great toe. He reports that his wound has remained closed. He o has orthotics. He has no issues or complaints today. Patient History Information obtained from Patient. Family History Cancer - Father,Mother,  Hypertension - Mother, No family history of Diabetes, Heart Disease, Hereditary Spherocytosis, Kidney Disease, Lung Disease, Seizures, Stroke,  Thyroid Problems, Tuberculosis. Social History Former smoker - quit 1985, Alcohol Use - Daily - wine or scotch, Drug Use - No History, Caffeine Use - Never. Medical History Eyes Patient has history of Cataracts - removed Denies history of Glaucoma, Optic Neuritis Ear/Nose/Mouth/Throat Denies history of Chronic sinus problems/congestion, Middle ear problems Hematologic/Lymphatic Denies history of Hemophilia, Human Immunodeficiency Virus, Lymphedema, Sickle Cell Disease Respiratory Denies history of Aspiration, Asthma, Chronic Obstructive Pulmonary Disease (COPD), Pneumothorax, Sleep Apnea, Tuberculosis Cardiovascular Patient has history of Coronary Artery Disease, Hypertension, Myocardial Infarction - NSTEMI Denies history of Angina, Arrhythmia, Congestive Heart Failure, Deep Vein Thrombosis, Hypotension, Peripheral Arterial Disease, Peripheral Venous Disease, Phlebitis, Vasculitis Gastrointestinal Denies history of Cirrhosis , Colitis, Crohnoos, Hepatitis A, Hepatitis B, Hepatitis C Endocrine Patient has history of Type II Diabetes - PCP notes type II; per patient takes metformin been told prediabetic Denies history of Type I Diabetes Genitourinary Denies history of End Stage Renal Disease Immunological Denies history of Lupus Erythematosus, Raynaudoos, Scleroderma Integumentary (Skin) Denies history of History of Burn Musculoskeletal Denies history of Gout, Rheumatoid Arthritis, Osteoarthritis, Osteomyelitis Neurologic Denies history of Dementia, Neuropathy, Quadriplegia, Paraplegia, Seizure Disorder Oncologic Denies history of Received Chemotherapy, Received Radiation Psychiatric Denies history of Anorexia/bulimia, Confinement Anxiety Hospitalization/Surgery History - stents MRI 2017and2019. - torn meniscus left 5 weeks ago. Medical A Surgical History Notes nd Constitutional Symptoms (General Health) Back pain x6  months Cardiovascular hyperlipidemia Genitourinary Elevated PSA Objective Constitutional respirations regular, non-labored and within target range for patient.. Vitals Time Taken: 9:24 AM, Height: 71 in, Weight: 252 lbs, BMI: 35.1, Temperature: 98 F, Pulse: 80 bpm, Respiratory Rate: 17 breaths/min, Blood Pressure: 119/85 mmHg. Cardiovascular 2+ dorsalis pedis/posterior tibialis pulses. Psychiatric pleasant and cooperative. General Notes: Left great toe: T the plantar aspect there is epithelization to the previous wound site. o Assessment Active Problems ICD-10 Non-pressure chronic ulcer of other part of left foot with fat layer exposed Type 2 diabetes mellitus with foot ulcer Presence of coronary angioplasty implant and graft Hyperlipidemia, unspecified Low back pain, unspecified Patient's wound has remained closed. I recommended continuing to use a felt offloading pad at least for the next couple weeks as this continues to heal. He has orthotics. He may follow-up as needed. Plan 1. Offloading foam doughnut 2. Orthotics 3. Follow-up as needed 4. Discharge from clinic due to closed wound Electronic Signature(s) Signed: 12/04/2021 12:24:18 PM By: Kalman Shan DO Entered By: Kalman Shan on 12/04/2021 10:18:11 -------------------------------------------------------------------------------- HxROS Details Patient Name: Date of Service: Alex Jackson, Alex BERT W. 12/04/2021 9:15 A M Medical Record Number: [id number] Patient Account Number: [id number] Date of Birth/Sex: Treating RN: [birth year] (76 y.o. M) Primary Care Provider: London Pepper Other Clinician: Referring Provider: Treating Provider/Extender: Merilyn Baba in Treatment: 27 Information Obtained From Patient Constitutional Symptoms (General Health) Medical History: Past Medical History Notes: Back pain x6 months Eyes Medical History: Positive for: Cataracts - removed Negative for:  Glaucoma; Optic Neuritis Ear/Nose/Mouth/Throat Medical History: Negative for: Chronic sinus problems/congestion; Middle ear problems Hematologic/Lymphatic Medical History: Negative for: Hemophilia; Human Immunodeficiency Virus; Lymphedema; Sickle Cell Disease Respiratory Medical History: Negative for: Aspiration; Asthma; Chronic Obstructive Pulmonary Disease (COPD); Pneumothorax; Sleep Apnea; Tuberculosis Cardiovascular Medical History: Positive for: Coronary Artery Disease; Hypertension; Myocardial Infarction - NSTEMI Negative for: Angina; Arrhythmia; Congestive Heart Failure; Deep Vein Thrombosis; Hypotension; Peripheral Arterial Disease; Peripheral Venous Disease; Phlebitis; Vasculitis Past Medical History Notes: hyperlipidemia Gastrointestinal  Medical History: Negative for: Cirrhosis ; Colitis; Crohns; Hepatitis A; Hepatitis B; Hepatitis C Endocrine Medical History: Positive for: Type II Diabetes - PCP notes type II; per patient takes metformin been told prediabetic Negative for: Type I Diabetes Time with diabetes: prediabetic 3-4 years per pt Treated with: Oral agents, Diet Blood sugar tested every day: No Genitourinary Medical History: Negative for: End Stage Renal Disease Past Medical History Notes: Elevated PSA Immunological Medical History: Negative for: Lupus Erythematosus; Raynauds; Scleroderma Integumentary (Skin) Medical History: Negative for: History of Burn Musculoskeletal Medical History: Negative for: Gout; Rheumatoid Arthritis; Osteoarthritis; Osteomyelitis Neurologic Medical History: Negative for: Dementia; Neuropathy; Quadriplegia; Paraplegia; Seizure Disorder Oncologic Medical History: Negative for: Received Chemotherapy; Received Radiation Psychiatric Medical History: Negative for: Anorexia/bulimia; Confinement Anxiety HBO Extended History Items Eyes: Cataracts Immunizations Pneumococcal Vaccine: Received Pneumococcal Vaccination:  Yes Received Pneumococcal Vaccination On or After 60th Birthday: Yes Implantable Devices No devices added Hospitalization / Surgery History Type of Hospitalization/Surgery stents MRI 2017and2019 torn meniscus left 5 weeks ago Family and Social History Cancer: Yes - Father,Mother; Diabetes: No; Heart Disease: No; Hereditary Spherocytosis: No; Hypertension: Yes - Mother; Kidney Disease: No; Lung Disease: No; Seizures: No; Stroke: No; Thyroid Problems: No; Tuberculosis: No; Former smoker - quit 1985; Alcohol Use: Daily - wine or scotch; Drug Use: No History; Caffeine Use: Never; Financial Concerns: No; Food, Clothing or Shelter Needs: No; Support System Lacking: No; Transportation Concerns: No Electronic Signature(s) Signed: 12/04/2021 12:24:18 PM By: Geralyn Corwin DO Entered By: Geralyn Corwin on 12/04/2021 10:16:01 -------------------------------------------------------------------------------- SuperBill Details Patient Name: Date of Service: Alex Riser. 12/04/2021 Medical Record Number: [id number] Patient Account Number: [id number] Date of Birth/Sex: Treating RN: Sep 29, 1945 (76 y.o. M) Primary Care Provider: [name] Other Clinician: Referring Provider: Treating Provider/Extender: Zena Amos in Treatment: 27 Diagnosis Coding ICD-10 Codes Code Description [phone] Non-pressure chronic ulcer of other part of left foot with fat layer exposed E11.621 Type 2 diabetes mellitus with foot ulcer Z95.5 Presence of coronary angioplasty implant and graft E78.5 Hyperlipidemia, unspecified M54.50 Low back pain, unspecified Facility Procedures CPT4 Code: [id number] Description: [phone] - WOUND CARE VISIT-LEV 2 EST PT Modifier: Quantity: 1 Physician Procedures : CPT4 Code Description Modifier [id number] - WC PHYS LEVEL 3 - EST PT ICD-10 Diagnosis Description L97.522 Non-pressure chronic ulcer of other part of left foot with fat layer exposed E11.621  Type 2 diabetes mellitus with foot ulcer Z95.5 Presence of  coronary angioplasty implant and graft M54.50 Low back pain, unspecified Quantity: 1 Electronic Signature(s) Signed: 12/04/2021 12:24:18 PM By: Geralyn Corwin DO Signed: 12/04/2021 3:56:15 PM By: Fonnie Mu RN Entered By: Fonnie Mu on 12/04/2021 11:08:23

## 2021-12-04 NOTE — Progress Notes (Signed)
Alex, Jackson ([id number]) Visit Report for 12/04/2021 Arrival Information Details Patient Name: Date of Service: Alex Jackson. 12/04/2021 9:15 A M Medical Record Number: [id number] Patient Account Number: [id number] Date of Birth/Sex: Treating RN: [birth year] (76 y.o. Alex Jackson, Alex Jackson Primary Care Alex Jackson: Alex Jackson Other Clinician: Referring Alex Jackson: Treating Alex Jackson/Extender: Alex Jackson in Treatment: 27 Visit Information History Since Last Visit Added or deleted any medications: No Patient Arrived: Alex Jackson Any new allergies or adverse reactions: No Arrival Time: 09:22 Had a fall or experienced change in No Accompanied By: self activities of daily living that may affect Transfer Assistance: None risk of falls: Patient Identification Verified: Yes Signs or symptoms of abuse/neglect since last visito No Secondary Verification Process Completed: Yes Hospitalized since last visit: No Patient Requires Transmission-Based Precautions: No Implantable device outside of the clinic excluding No Patient Jackson Alerts: Yes cellular tissue based products placed in the center Patient Alerts: 11/18/20 ABI L1.04 since last visit: 11/18/20 TBI L 1.35 Jackson Dressing in Place as Prescribed: Yes Pain Present Now: No Electronic Signature(s) Signed: 12/04/2021 3:56:15 PM By: Alex Mu RN Entered By: Alex Jackson on 12/04/2021 09:23:05 -------------------------------------------------------------------------------- Clinic Level of Care Assessment Details Patient Name: Date of Service: Alex Carp Jackson. 12/04/2021 9:15 A M Medical Record Number: [id number] Patient Account Number: [id number] Date of Birth/Sex: Treating RN: January 12, 1946 (76 y.o. Alex Jackson, Alex Jackson Primary Care Alex Jackson: Alex Jackson Other Clinician: Referring Alex Jackson: Treating Alex Jackson/Extender: Alex Jackson in Treatment: 27 Clinic Level of Care Assessment  Items TOOL 4 Quantity Score X- 1 0 Use when only an EandM is performed on FOLLOW-UP visit ASSESSMENTS - Nursing Assessment / Reassessment X- 1 10 Reassessment of Co-morbidities (includes updates in patient status) X- 1 5 Reassessment of Adherence to Treatment Plan ASSESSMENTS - Wound and Skin A ssessment / Reassessment X - Simple Wound Assessment / Reassessment - one wound 1 5 []  - 0 Complex Wound Assessment / Reassessment - multiple wounds []  - 0 Dermatologic / Skin Assessment (not related to wound area) ASSESSMENTS - Focused Assessment X- 1 5 Circumferential Edema Measurements - multi extremities []  - 0 Nutritional Assessment / Counseling / Intervention []  - 0 Lower Extremity Assessment (monofilament, tuning fork, pulses) []  - 0 Peripheral Arterial Disease Assessment (using hand held doppler) ASSESSMENTS - Ostomy and/or Continence Assessment and Care []  - 0 Incontinence Assessment and Management []  - 0 Ostomy Care Assessment and Management (repouching, etc.) PROCESS - Coordination of Care X - Simple Patient / Family Education for ongoing care 1 15 []  - 0 Complex (extensive) Patient / Family Education for ongoing care X- 1 10 Staff obtains , Records, Alex Jackson / Process Orders est []  - 0 Staff telephones HHA, Nursing Homes / Clarify orders / etc []  - 0 Routine Transfer to another Facility (non-emergent condition) []  - 0 Routine Hospital Admission (non-emergent condition) []  - 0 New Admissions / / Ordering NPWT Apligraf, etc. , []  - 0 Emergency Hospital Admission (emergent condition) X- 1 10 Simple Discharge Coordination []  - 0 Complex (extensive) Discharge Coordination PROCESS - Special Needs []  - 0 Pediatric / Minor Patient Management []  - 0 Isolation Patient Management []  - 0 Hearing / Language / Visual special needs []  - 0 Assessment of Community assistance (transportation, D/C planning, etc.) []  - 0 Additional  assistance / Altered mentation []  - 0 Support Surface(s) Assessment (bed, cushion, seat, etc.) INTERVENTIONS - Wound Cleansing / Measurement []  - 0 Simple Wound  Cleansing - one wound []  - 0 Complex Wound Cleansing - multiple wounds []  - 0 Wound Imaging (photographs - any number of wounds) []  - 0 Wound Tracing (instead of photographs) []  - 0 Simple Wound Measurement - one wound []  - 0 Complex Wound Measurement - multiple wounds INTERVENTIONS - Wound Dressings X - Small Wound Dressing one or multiple wounds 1 10 []  - 0 Medium Wound Dressing one or multiple wounds []  - 0 Large Wound Dressing one or multiple wounds []  - 0 Application of Medications - topical []  - 0 Application of Medications - injection INTERVENTIONS - Miscellaneous []  - 0 External ear exam []  - 0 Specimen Collection (cultures, biopsies, blood, body fluids, etc.) []  - 0 Specimen(s) / Culture(s) sent or taken to Lab for analysis []  - 0 Patient Transfer (multiple staff / / Similar devices) []  - 0 Simple Staple / Suture removal (25 or less) []  - 0 Complex Staple / Suture removal (26 or more) []  - 0 Hypo / Hyperglycemic Management (close monitor of Blood Glucose) []  - 0 Ankle / Brachial Index (ABI) - do not check if billed separately X- 1 5 Vital Signs Jackson the patient been seen at the hospital within the last three years: Yes Total Score: 75 Level Of Care: New/Established - Level 2 Electronic Signature(s) Signed: 12/04/2021 3:56:15 PM By: RN Entered By: on 12/04/2021 11:08:17 -------------------------------------------------------------------------------- Encounter Discharge Information Details Patient Name: Date of Service: , Alex Jackson. 12/04/2021 9:15 A M Medical Record Number: Patient Account Number: Date of Birth/Sex: Treating RN: 1945-06-01 (76 y.o. , Alex Jackson Primary Care Alex Jackson: Other  Clinician: Referring Alex Jackson: Treating Alex Jackson/Extender: Nurse, adult in Treatment: 27 Encounter Discharge Information Items Discharge Condition: Stable Ambulatory Status: Cane Discharge Destination: Home Transportation: Private Auto Accompanied By: self Schedule Follow-up Appointment: Yes Clinical Summary of Care: Patient Declined Electronic Signature(s) Signed: 12/04/2021 3:56:15 PM By: RN Entered By: on 12/04/2021 11:08:46 -------------------------------------------------------------------------------- Lower Extremity Assessment Details Patient Name: Date of Service: Alex Jackson Jackson. 12/04/2021 9:15 A M Medical Record Number: 02/03/2022 Patient Account Number: Sol Passer Date of Birth/Sex: Treating RN: [birth year] (76 y.o. [id number], Alex Jackson Primary Care Shannan Garfinkel: 13/03/1946 Other Clinician: Referring Trimaine Maser: Treating Cartier Mapel/Extender: 67 in Treatment: 27 Edema Assessment Assessed: Alex Jackson: Yes] Alex Jackson: No] Edema: [Left: N] [Right: o] Calf Left: Right: Point of Measurement: 36 cm From Medial Instep 37 cm Ankle Left: Right: Point of Measurement: 12 cm From Medial Instep 22 cm Vascular Assessment Pulses: Dorsalis Pedis Palpable: [Left:Yes] Posterior Tibial Palpable: [Left:Yes] Electronic Signature(s) Signed: 12/04/2021 3:56:15 PM By: 28 RN Entered By: 02/03/2022 on 12/04/2021 09:30:51 -------------------------------------------------------------------------------- Multi Wound Chart Details Patient Name: Date of Service: Alex Jackson, Alex Jackson. 12/04/2021 9:15 A M Medical Record Number: Alex Carp Patient Account Number: 02/03/2022 Date of Birth/Sex: Treating RN: 1945/11/15 (76 y.o. M) Primary Care Isreal Moline: 13/03/1946 Other Clinician: Referring Dandra Velardi: Treating Jeffree Cazeau/Extender: 66 in Treatment:  27 Vital Signs Height(in): 71 Pulse(bpm): 80 Weight(lbs): 252 Blood Pressure(mmHg): 119/85 Body Mass Index(BMI): 35.1 Temperature(F): 98 Respiratory Rate(breaths/min): 17 Wound Assessments Treatment Notes Electronic Signature(s) Signed: 12/04/2021 12:24:18 PM By: Alex Has DO Entered By: Alex Jackson on 12/04/2021 10:14:34 -------------------------------------------------------------------------------- Multi-Disciplinary Care Plan Details Patient Name: Date of Service: Franne Forts, Alex Jackson. 12/04/2021 9:15 A M Medical Record Number: Alex Jackson Patient Account Number: Alex Jackson Date of Birth/Sex:  Treating RN: [birth year] (76 y.o. Alex Jackson, Alex Jackson Primary Care Christe Tellez: Alex Jackson Other Clinician: Referring Manasvini Whatley: Treating Lafayette Dunlevy/Extender: Alex Jackson in Treatment: 27 Multidisciplinary Care Plan reviewed with physician Active Inactive Electronic Signature(s) Signed: 12/04/2021 3:56:15 PM By: Alex Mu RN Entered By: Alex Jackson on 12/04/2021 11:07:37 -------------------------------------------------------------------------------- Pain Assessment Details Patient Name: Date of Service: Alex Carp Jackson. 12/04/2021 9:15 A M Medical Record Number: [id number] Patient Account Number: [id number] Date of Birth/Sex: Treating RN: 1945/07/28 (76 y.o. Alex Jackson, Alex Jackson Primary Care Marius Betts: Alex Jackson Other Clinician: Referring Lachelle Rissler: Treating Dallon Dacosta/Extender: Alex Jackson in Treatment: 27 Active Problems Location of Pain Severity and Description of Pain Patient Jackson Paino No Site Locations Pain Management and Medication Current Pain Management: Electronic Signature(s) Signed: 12/04/2021 3:56:15 PM By: Alex Mu RN Entered By: Alex Jackson on 12/04/2021 09:24:09 -------------------------------------------------------------------------------- Patient/Caregiver Education  Details Patient Name: Date of Service: Alex Jackson 8/3/2023andnbsp9:15 A M Medical Record Number: [id number] Patient Account Number: [id number] Date of Birth/Gender: Treating RN: [birth year] (76 y.o. [name] Primary Care Physician: Alex Jackson Other Clinician: Referring Physician: Treating Physician/Extender: Alex Jackson in Treatment: 27 Education Assessment Education Provided To: Patient Education Topics Provided Wound/Skin Impairment: Methods: Explain/Verbal Responses: State content correctly [employer]) Signed: 12/04/2021 3:56:15 PM By: Alex Mu RN Entered By: Alex Jackson on 12/04/2021 11:07:49 -------------------------------------------------------------------------------- Vitals Details Patient Name: Date of Service: Sol Passer, Alex Jackson. 12/04/2021 9:15 A M Medical Record Number: [id number] Patient Account Number: [id number] Date of Birth/Sex: Treating RN: 14-Apr-1946 (76 y.o. Alex Jackson, Alex Jackson Primary Care Alizah Sills: Alex Jackson Other Clinician: Referring Malakhi Markwood: Treating Cortasia Screws/Extender: Alex Jackson in Treatment: 27 Vital Signs Time Taken: 09:24 Temperature (F): 98 Height (in): 71 Pulse (bpm): 80 Weight (lbs): 252 Respiratory Rate (breaths/min): 17 Body Mass Index (BMI): 35.1 Blood Pressure (mmHg): 119/85 Reference Range: 80 - 120 mg / dl Electronic Signature(s) Signed: 12/04/2021 3:56:15 PM By: Alex Mu RN Entered By: Alex Jackson on 12/04/2021 09:24:03

## 2021-12-09 DIAGNOSIS — M5117 Intervertebral disc disorders with radiculopathy, lumbosacral region: Secondary | ICD-10-CM | POA: Diagnosis not present

## 2021-12-09 DIAGNOSIS — M25652 Stiffness of left hip, not elsewhere classified: Secondary | ICD-10-CM | POA: Diagnosis not present

## 2021-12-09 DIAGNOSIS — R262 Difficulty in walking, not elsewhere classified: Secondary | ICD-10-CM | POA: Diagnosis not present

## 2021-12-09 DIAGNOSIS — M25651 Stiffness of right hip, not elsewhere classified: Secondary | ICD-10-CM | POA: Diagnosis not present

## 2021-12-10 DIAGNOSIS — M5416 Radiculopathy, lumbar region: Secondary | ICD-10-CM | POA: Diagnosis not present

## 2021-12-10 DIAGNOSIS — Z6835 Body mass index (BMI) 35.0-35.9, adult: Secondary | ICD-10-CM | POA: Diagnosis not present

## 2021-12-11 ENCOUNTER — Ambulatory Visit
Admission: RE | Admit: 2021-12-11 | Discharge: 2021-12-11 | Disposition: A | Discharge status: Home / Self Care | Payer: PPO | Source: Ambulatory Visit | Attending: Urology | Admitting: Urology

## 2021-12-11 DIAGNOSIS — M549 Dorsalgia, unspecified: Secondary | ICD-10-CM | POA: Diagnosis not present

## 2021-12-11 DIAGNOSIS — M5117 Intervertebral disc disorders with radiculopathy, lumbosacral region: Secondary | ICD-10-CM | POA: Diagnosis not present

## 2021-12-11 DIAGNOSIS — M25651 Stiffness of right hip, not elsewhere classified: Secondary | ICD-10-CM | POA: Diagnosis not present

## 2021-12-11 DIAGNOSIS — R972 Elevated prostate specific antigen [PSA]: Secondary | ICD-10-CM

## 2021-12-11 DIAGNOSIS — Z5181 Encounter for therapeutic drug level monitoring: Secondary | ICD-10-CM | POA: Diagnosis not present

## 2021-12-11 DIAGNOSIS — I10 Essential (primary) hypertension: Secondary | ICD-10-CM | POA: Diagnosis not present

## 2021-12-11 DIAGNOSIS — M25652 Stiffness of left hip, not elsewhere classified: Secondary | ICD-10-CM | POA: Diagnosis not present

## 2021-12-11 DIAGNOSIS — R262 Difficulty in walking, not elsewhere classified: Secondary | ICD-10-CM | POA: Diagnosis not present

## 2021-12-11 DIAGNOSIS — E785 Hyperlipidemia, unspecified: Secondary | ICD-10-CM | POA: Diagnosis not present

## 2021-12-11 DIAGNOSIS — E1169 Type 2 diabetes mellitus with other specified complication: Secondary | ICD-10-CM | POA: Diagnosis not present

## 2021-12-11 DIAGNOSIS — D649 Anemia, unspecified: Secondary | ICD-10-CM | POA: Diagnosis not present

## 2021-12-11 MED ORDER — GADOBENATE DIMEGLUMINE 529 MG/ML IV SOLN
20.0000 mL | Freq: Once | INTRAVENOUS | Status: AC | PRN
  Administered 2021-12-11: 20 mL via INTRAVENOUS

## 2021-12-16 ENCOUNTER — Other Ambulatory Visit: Payer: Self-pay | Admitting: Neurological Surgery

## 2021-12-16 DIAGNOSIS — M5416 Radiculopathy, lumbar region: Secondary | ICD-10-CM

## 2021-12-18 DIAGNOSIS — M25652 Stiffness of left hip, not elsewhere classified: Secondary | ICD-10-CM | POA: Diagnosis not present

## 2021-12-18 DIAGNOSIS — M5117 Intervertebral disc disorders with radiculopathy, lumbosacral region: Secondary | ICD-10-CM | POA: Diagnosis not present

## 2021-12-18 DIAGNOSIS — M25651 Stiffness of right hip, not elsewhere classified: Secondary | ICD-10-CM | POA: Diagnosis not present

## 2021-12-18 DIAGNOSIS — R262 Difficulty in walking, not elsewhere classified: Secondary | ICD-10-CM | POA: Diagnosis not present

## 2021-12-23 ENCOUNTER — Ambulatory Visit
Admission: RE | Admit: 2021-12-23 | Discharge: 2021-12-23 | Disposition: A | Discharge status: Home / Self Care | Payer: PPO | Source: Ambulatory Visit | Attending: Neurological Surgery | Admitting: Neurological Surgery

## 2021-12-23 DIAGNOSIS — M5416 Radiculopathy, lumbar region: Secondary | ICD-10-CM | POA: Diagnosis not present

## 2021-12-23 MED ORDER — IOPAMIDOL (ISOVUE-M 200) INJECTION 41%
1.0000 mL | Freq: Once | INTRAMUSCULAR | Status: AC
  Administered 2021-12-23: 1 mL via EPIDURAL

## 2021-12-23 MED ORDER — METHYLPREDNISOLONE ACETATE 40 MG/ML INJ SUSP (RADIOLOG
80.0000 mg | Freq: Once | INTRAMUSCULAR | Status: AC
  Administered 2021-12-23: 80 mg via EPIDURAL

## 2021-12-23 NOTE — Discharge Instructions (Signed)

## 2021-12-25 ENCOUNTER — Telehealth: Payer: Self-pay | Admitting: Cardiology

## 2021-12-25 ENCOUNTER — Other Ambulatory Visit: Payer: Self-pay

## 2021-12-25 ENCOUNTER — Ambulatory Visit (INDEPENDENT_AMBULATORY_CARE_PROVIDER_SITE_OTHER): Payer: PPO | Admitting: Internal Medicine

## 2021-12-25 DIAGNOSIS — L089 Local infection of the skin and subcutaneous tissue, unspecified: Secondary | ICD-10-CM | POA: Diagnosis not present

## 2021-12-25 DIAGNOSIS — E11628 Type 2 diabetes mellitus with other skin complications: Secondary | ICD-10-CM | POA: Diagnosis not present

## 2021-12-25 NOTE — Telephone Encounter (Signed)
Pt is calling requesting to speak to pharmacist, Thayer Ohm. He states that he was given a direct number, but he believes it may be wrong.

## 2021-12-25 NOTE — Progress Notes (Signed)
Virtual Visit via Telephone Note  I connected with Alex Jackson on 12/25/21 at 11:15 AM EDT by telephone and verified that I am speaking with the correct person using two identifiers.  Location: Patient: Home Provider: [facility]   I discussed the limitations, risks, security and privacy concerns of performing an evaluation and management service by telephone and the availability of in person appointments. I also discussed with the patient that there may be a patient responsible charge related to this service. The patient expressed understanding and agreed to proceed.   History of Present Illness: I called and spoke with Alex Jackson today.  Alex Jackson completed a long course of antibiotic therapy for left great toe osteomyelitis on 11/26/2021.  His plantar ulcer had almost completely healed.  Alex Jackson was released from the wound center on 12/04/2021.  Alex Jackson says that his toe has continued to improve.  There is no unusual redness, swelling or pain in his toe and the small ulcer has gotten even smaller.  Alex Jackson is having no wound drainage.  Alex Jackson is checking his feet daily.   Observations/Objective:   Assessment and Plan: I am very hopeful that his osteomyelitis has been cured.  Follow Up Instructions: Alex Jackson will follow-up here as needed   I discussed the assessment and treatment plan with the patient. The patient was provided an opportunity to ask questions and all were answered. The patient agreed with the plan and demonstrated an understanding of the instructions.   The patient was advised to call back or seek an in-person evaluation if the symptoms worsen or if the condition fails to improve as anticipated.  I provided 13 minutes of non-face-to-face time during this encounter.   Cliffton Asters, MD

## 2021-12-25 NOTE — Telephone Encounter (Signed)
Patient called to ask about his Ozempic. Has been taking 0.5mg  now for a few weeks. A1c has reduced, PCP d/c metformin and patient is pleased. Still has occasional indigestion and stomach upset. Takes pantoprazole 40mg  daily and wanted to know if it could be increased. Reports that often the indigestion is relieved by Tums. Advised that indigestion is typically caused by food choices or eating too large a meal. Advised to try to watch food choices and eat smaller meals and treat with antacids to see if that helps before increasing pantoprazole. Patient voiced understanding.

## 2021-12-26 DIAGNOSIS — M25651 Stiffness of right hip, not elsewhere classified: Secondary | ICD-10-CM | POA: Diagnosis not present

## 2021-12-26 DIAGNOSIS — M25652 Stiffness of left hip, not elsewhere classified: Secondary | ICD-10-CM | POA: Diagnosis not present

## 2021-12-26 DIAGNOSIS — M5117 Intervertebral disc disorders with radiculopathy, lumbosacral region: Secondary | ICD-10-CM | POA: Diagnosis not present

## 2021-12-26 DIAGNOSIS — R262 Difficulty in walking, not elsewhere classified: Secondary | ICD-10-CM | POA: Diagnosis not present

## 2021-12-30 DIAGNOSIS — M5117 Intervertebral disc disorders with radiculopathy, lumbosacral region: Secondary | ICD-10-CM | POA: Diagnosis not present

## 2021-12-30 DIAGNOSIS — M25651 Stiffness of right hip, not elsewhere classified: Secondary | ICD-10-CM | POA: Diagnosis not present

## 2021-12-30 DIAGNOSIS — R262 Difficulty in walking, not elsewhere classified: Secondary | ICD-10-CM | POA: Diagnosis not present

## 2021-12-30 DIAGNOSIS — M25652 Stiffness of left hip, not elsewhere classified: Secondary | ICD-10-CM | POA: Diagnosis not present

## 2022-01-01 DIAGNOSIS — M25652 Stiffness of left hip, not elsewhere classified: Secondary | ICD-10-CM | POA: Diagnosis not present

## 2022-01-01 DIAGNOSIS — M25651 Stiffness of right hip, not elsewhere classified: Secondary | ICD-10-CM | POA: Diagnosis not present

## 2022-01-01 DIAGNOSIS — M5117 Intervertebral disc disorders with radiculopathy, lumbosacral region: Secondary | ICD-10-CM | POA: Diagnosis not present

## 2022-01-01 DIAGNOSIS — R262 Difficulty in walking, not elsewhere classified: Secondary | ICD-10-CM | POA: Diagnosis not present

## 2022-01-02 DIAGNOSIS — M5117 Intervertebral disc disorders with radiculopathy, lumbosacral region: Secondary | ICD-10-CM | POA: Diagnosis not present

## 2022-01-02 DIAGNOSIS — M25652 Stiffness of left hip, not elsewhere classified: Secondary | ICD-10-CM | POA: Diagnosis not present

## 2022-01-02 DIAGNOSIS — R262 Difficulty in walking, not elsewhere classified: Secondary | ICD-10-CM | POA: Diagnosis not present

## 2022-01-02 DIAGNOSIS — M25651 Stiffness of right hip, not elsewhere classified: Secondary | ICD-10-CM | POA: Diagnosis not present

## 2022-01-06 DIAGNOSIS — M25652 Stiffness of left hip, not elsewhere classified: Secondary | ICD-10-CM | POA: Diagnosis not present

## 2022-01-06 DIAGNOSIS — M25651 Stiffness of right hip, not elsewhere classified: Secondary | ICD-10-CM | POA: Diagnosis not present

## 2022-01-06 DIAGNOSIS — R262 Difficulty in walking, not elsewhere classified: Secondary | ICD-10-CM | POA: Diagnosis not present

## 2022-01-06 DIAGNOSIS — M5117 Intervertebral disc disorders with radiculopathy, lumbosacral region: Secondary | ICD-10-CM | POA: Diagnosis not present

## 2022-01-07 ENCOUNTER — Encounter: Payer: Self-pay | Admitting: Pharmacist

## 2022-01-07 DIAGNOSIS — E119 Type 2 diabetes mellitus without complications: Secondary | ICD-10-CM

## 2022-01-08 DIAGNOSIS — M25652 Stiffness of left hip, not elsewhere classified: Secondary | ICD-10-CM | POA: Diagnosis not present

## 2022-01-08 DIAGNOSIS — M25651 Stiffness of right hip, not elsewhere classified: Secondary | ICD-10-CM | POA: Diagnosis not present

## 2022-01-08 DIAGNOSIS — R262 Difficulty in walking, not elsewhere classified: Secondary | ICD-10-CM | POA: Diagnosis not present

## 2022-01-08 DIAGNOSIS — M5117 Intervertebral disc disorders with radiculopathy, lumbosacral region: Secondary | ICD-10-CM | POA: Diagnosis not present

## 2022-01-08 MED ORDER — OZEMPIC (0.25 OR 0.5 MG/DOSE) 2 MG/3ML ~~LOC~~ SOPN: 0.5000 mg | PEN_INJECTOR | SUBCUTANEOUS | 2 refills | Status: DC

## 2022-01-13 DIAGNOSIS — M5117 Intervertebral disc disorders with radiculopathy, lumbosacral region: Secondary | ICD-10-CM | POA: Diagnosis not present

## 2022-01-13 DIAGNOSIS — M25652 Stiffness of left hip, not elsewhere classified: Secondary | ICD-10-CM | POA: Diagnosis not present

## 2022-01-13 DIAGNOSIS — R262 Difficulty in walking, not elsewhere classified: Secondary | ICD-10-CM | POA: Diagnosis not present

## 2022-01-13 DIAGNOSIS — M25651 Stiffness of right hip, not elsewhere classified: Secondary | ICD-10-CM | POA: Diagnosis not present

## 2022-01-15 DIAGNOSIS — M9903 Segmental and somatic dysfunction of lumbar region: Secondary | ICD-10-CM | POA: Diagnosis not present

## 2022-01-15 DIAGNOSIS — M5431 Sciatica, right side: Secondary | ICD-10-CM | POA: Diagnosis not present

## 2022-01-16 DIAGNOSIS — R262 Difficulty in walking, not elsewhere classified: Secondary | ICD-10-CM | POA: Diagnosis not present

## 2022-01-16 DIAGNOSIS — M25651 Stiffness of right hip, not elsewhere classified: Secondary | ICD-10-CM | POA: Diagnosis not present

## 2022-01-16 DIAGNOSIS — M25652 Stiffness of left hip, not elsewhere classified: Secondary | ICD-10-CM | POA: Diagnosis not present

## 2022-01-16 DIAGNOSIS — M5117 Intervertebral disc disorders with radiculopathy, lumbosacral region: Secondary | ICD-10-CM | POA: Diagnosis not present

## 2022-01-19 DIAGNOSIS — M5431 Sciatica, right side: Secondary | ICD-10-CM | POA: Diagnosis not present

## 2022-01-19 DIAGNOSIS — M9903 Segmental and somatic dysfunction of lumbar region: Secondary | ICD-10-CM | POA: Diagnosis not present

## 2022-01-20 DIAGNOSIS — M25652 Stiffness of left hip, not elsewhere classified: Secondary | ICD-10-CM | POA: Diagnosis not present

## 2022-01-20 DIAGNOSIS — M25651 Stiffness of right hip, not elsewhere classified: Secondary | ICD-10-CM | POA: Diagnosis not present

## 2022-01-20 DIAGNOSIS — M9903 Segmental and somatic dysfunction of lumbar region: Secondary | ICD-10-CM | POA: Diagnosis not present

## 2022-01-20 DIAGNOSIS — R262 Difficulty in walking, not elsewhere classified: Secondary | ICD-10-CM | POA: Diagnosis not present

## 2022-01-20 DIAGNOSIS — M5117 Intervertebral disc disorders with radiculopathy, lumbosacral region: Secondary | ICD-10-CM | POA: Diagnosis not present

## 2022-01-20 DIAGNOSIS — M5431 Sciatica, right side: Secondary | ICD-10-CM | POA: Diagnosis not present

## 2022-01-21 DIAGNOSIS — M5431 Sciatica, right side: Secondary | ICD-10-CM | POA: Diagnosis not present

## 2022-01-21 DIAGNOSIS — M9903 Segmental and somatic dysfunction of lumbar region: Secondary | ICD-10-CM | POA: Diagnosis not present

## 2022-01-22 DIAGNOSIS — M25652 Stiffness of left hip, not elsewhere classified: Secondary | ICD-10-CM | POA: Diagnosis not present

## 2022-01-22 DIAGNOSIS — R262 Difficulty in walking, not elsewhere classified: Secondary | ICD-10-CM | POA: Diagnosis not present

## 2022-01-22 DIAGNOSIS — M25651 Stiffness of right hip, not elsewhere classified: Secondary | ICD-10-CM | POA: Diagnosis not present

## 2022-01-22 DIAGNOSIS — M5117 Intervertebral disc disorders with radiculopathy, lumbosacral region: Secondary | ICD-10-CM | POA: Diagnosis not present

## 2022-01-26 DIAGNOSIS — M48061 Spinal stenosis, lumbar region without neurogenic claudication: Secondary | ICD-10-CM | POA: Diagnosis not present

## 2022-01-26 DIAGNOSIS — M5431 Sciatica, right side: Secondary | ICD-10-CM | POA: Diagnosis not present

## 2022-01-26 DIAGNOSIS — Z6834 Body mass index (BMI) 34.0-34.9, adult: Secondary | ICD-10-CM | POA: Diagnosis not present

## 2022-01-26 DIAGNOSIS — M47816 Spondylosis without myelopathy or radiculopathy, lumbar region: Secondary | ICD-10-CM | POA: Diagnosis not present

## 2022-01-26 DIAGNOSIS — M9903 Segmental and somatic dysfunction of lumbar region: Secondary | ICD-10-CM | POA: Diagnosis not present

## 2022-01-26 DIAGNOSIS — M5416 Radiculopathy, lumbar region: Secondary | ICD-10-CM | POA: Diagnosis not present

## 2022-01-27 DIAGNOSIS — M25652 Stiffness of left hip, not elsewhere classified: Secondary | ICD-10-CM | POA: Diagnosis not present

## 2022-01-27 DIAGNOSIS — R262 Difficulty in walking, not elsewhere classified: Secondary | ICD-10-CM | POA: Diagnosis not present

## 2022-01-27 DIAGNOSIS — M9903 Segmental and somatic dysfunction of lumbar region: Secondary | ICD-10-CM | POA: Diagnosis not present

## 2022-01-27 DIAGNOSIS — M5431 Sciatica, right side: Secondary | ICD-10-CM | POA: Diagnosis not present

## 2022-01-27 DIAGNOSIS — M25651 Stiffness of right hip, not elsewhere classified: Secondary | ICD-10-CM | POA: Diagnosis not present

## 2022-01-27 DIAGNOSIS — M5117 Intervertebral disc disorders with radiculopathy, lumbosacral region: Secondary | ICD-10-CM | POA: Diagnosis not present

## 2022-01-29 ENCOUNTER — Other Ambulatory Visit: Payer: Self-pay | Admitting: Neurological Surgery

## 2022-01-29 DIAGNOSIS — M9903 Segmental and somatic dysfunction of lumbar region: Secondary | ICD-10-CM | POA: Diagnosis not present

## 2022-01-29 DIAGNOSIS — M5416 Radiculopathy, lumbar region: Secondary | ICD-10-CM

## 2022-01-29 DIAGNOSIS — M5431 Sciatica, right side: Secondary | ICD-10-CM | POA: Diagnosis not present

## 2022-02-02 DIAGNOSIS — M9903 Segmental and somatic dysfunction of lumbar region: Secondary | ICD-10-CM | POA: Diagnosis not present

## 2022-02-02 DIAGNOSIS — M5431 Sciatica, right side: Secondary | ICD-10-CM | POA: Diagnosis not present

## 2022-02-03 ENCOUNTER — Telehealth: Payer: Self-pay

## 2022-02-03 DIAGNOSIS — M25652 Stiffness of left hip, not elsewhere classified: Secondary | ICD-10-CM | POA: Diagnosis not present

## 2022-02-03 DIAGNOSIS — M25651 Stiffness of right hip, not elsewhere classified: Secondary | ICD-10-CM | POA: Diagnosis not present

## 2022-02-03 DIAGNOSIS — R262 Difficulty in walking, not elsewhere classified: Secondary | ICD-10-CM | POA: Diagnosis not present

## 2022-02-03 DIAGNOSIS — M5117 Intervertebral disc disorders with radiculopathy, lumbosacral region: Secondary | ICD-10-CM | POA: Diagnosis not present

## 2022-02-03 MED ORDER — OZEMPIC (1 MG/DOSE) 4 MG/3ML ~~LOC~~ SOPN: 1.0000 mg | PEN_INJECTOR | SUBCUTANEOUS | 2 refills | Status: DC

## 2022-02-03 NOTE — Addendum Note (Signed)
Addended by: Rollen Sox on: 02/03/2022 07:17 AM   Modules accepted: Orders

## 2022-02-03 NOTE — Telephone Encounter (Signed)
Patient called office today requesting to speak with provider regarding concerns for left foot. States that his wound is starting to open up. Denies any swelling/ pain/ redness at wound site. Physical therapist looked at wound and had concerns for tunneling.  Is scheduled with Dr. Megan Salon tomorrow at 930.  Is scheduled to see [facility] on 10/12. Would prefer to see Dr. Megan Salon before seeing podiatry/ wound care. Leatrice Jewels, RMA

## 2022-02-04 ENCOUNTER — Telehealth: Payer: Self-pay | Admitting: Cardiology

## 2022-02-04 ENCOUNTER — Other Ambulatory Visit: Payer: Self-pay

## 2022-02-04 ENCOUNTER — Encounter: Payer: Self-pay | Admitting: Internal Medicine

## 2022-02-04 ENCOUNTER — Other Ambulatory Visit (HOSPITAL_BASED_OUTPATIENT_CLINIC_OR_DEPARTMENT_OTHER): Payer: Self-pay

## 2022-02-04 ENCOUNTER — Ambulatory Visit (INDEPENDENT_AMBULATORY_CARE_PROVIDER_SITE_OTHER): Payer: PPO | Admitting: Internal Medicine

## 2022-02-04 DIAGNOSIS — L97521 Non-pressure chronic ulcer of other part of left foot limited to breakdown of skin: Secondary | ICD-10-CM

## 2022-02-04 DIAGNOSIS — L97529 Non-pressure chronic ulcer of other part of left foot with unspecified severity: Secondary | ICD-10-CM | POA: Insufficient documentation

## 2022-02-04 MED ORDER — OZEMPIC (1 MG/DOSE) 4 MG/3ML ~~LOC~~ SOPN
1.0000 mg | PEN_INJECTOR | SUBCUTANEOUS | 2 refills | Status: DC
  Filled 2022-02-04 – 2022-03-31 (×2): qty 3, 28d supply, fill #0
  Filled 2022-05-05: qty 3, 28d supply, fill #1
  Filled 2022-05-26: qty 3, 28d supply, fill #2

## 2022-02-04 NOTE — Telephone Encounter (Signed)
Pt c/o medication issue:  1. Name of Medication: Semaglutide, 1 MG/DOSE, (OZEMPIC, 1 MG/DOSE,) 4 MG/3ML SOPN  2. How are you currently taking this medication (dosage and times per day)? As prescribed  3. Are you having a reaction (difficulty breathing--STAT)? No   4. What is your medication issue? Patient states he tried to pick up this medication and was told it has gone up to $248. He is wanting to know if there is anything we can do to assist with the cost. Please advise.

## 2022-02-04 NOTE — Assessment & Plan Note (Signed)
I am still hopeful that his osteomyelitis was cured with his recent antibiotic therapy.  I do not see any evidence of active infection at this time but but his pressure ulcer has enlarged.  I certainly agree with podiatry evaluation next week.  I will arrange phone follow-up in 2 weeks.

## 2022-02-04 NOTE — Telephone Encounter (Signed)
Will forward this message to our PharmD team and Dr. Johney Frame, for further assistance with this.

## 2022-02-04 NOTE — Addendum Note (Signed)
Addended by: Rollen Sox on: 02/04/2022 12:40 PM   Modules accepted: Orders

## 2022-02-04 NOTE — Progress Notes (Signed)
[facility]  Patient Active Problem List   Diagnosis Date Noted   Plantar ulcer of left foot (HCC) 02/04/2022    Priority: High   Diabetic infection of left foot (HCC) 10/22/2021    Priority: High   Osteomyelitis of left foot (HCC) 10/22/2021    Priority: High   Piriformis syndrome of left side 07/23/2017   Coronary artery disease involving native coronary artery of native heart with unstable angina pectoris (HCC) 02/11/2017   Lumbar spondylosis 02/11/2017   Iliotibial band syndrome of both sides 02/11/2017   H/O hyperglycemia 02/11/2017   Chronic lower back pain 02/11/2017   Cardiomyopathy, ischemic 09/16/2015   SOB (shortness of breath) 09/16/2015   LV dysfunction 09/16/2015   Hyperlipidemia 03/02/2015   Essential hypertension 03/02/2015   S/P drug eluting coronary stent placement 03/02/2015   NSTEMI (non-ST elevated myocardial infarction) (HCC) 03/01/2015    Patient's Medications  New Prescriptions   No medications on file  Previous Medications   ACETAMINOPHEN (TYLENOL) 325 MG TABLET    Take 2 tablets (650 mg total) by mouth every 4 (four) hours as needed for headache or mild pain.   AMOXICILLIN-CLAVULANATE (AUGMENTIN) 875-125 MG TABLET    Take 1 tablet by mouth 2 (two) times daily.   ASPIRIN EC 81 MG TABLET    Take 81 mg by mouth daily.   BISOPROLOL (ZEBETA) 10 MG TABLET    TAKE ONE TABLET BY MOUTH EVERY MORNING   CHLORTHALIDONE (HYGROTON) 25 MG TABLET    TAKE ONE TABLET BY MOUTH ONCE DAILY   CYCLOBENZAPRINE (FLEXERIL) 10 MG TABLET    Take 1 tablet (10 mg total) by mouth 2 (two) times daily as needed for up to 20 doses for muscle spasms.   FLUTICASONE (FLONASE) 50 MCG/ACT NASAL SPRAY    USE 1 SPRAY IN EACH NOSTRIL EVERY DAY   FUROSEMIDE (LASIX) 40 MG TABLET    Take 1 tablet (40 mg total) by mouth daily.   HYDROXYPROPYL METHYLCELLULOSE / HYPROMELLOSE (ISOPTO TEARS / GONIOVISC) 2.5 % OPHTHALMIC SOLUTION    Place 1 drop into both eyes as  needed for dry eyes.   LISINOPRIL (ZESTRIL) 40 MG TABLET    TAKE ONE TABLET BY MOUTH ONCE DAILY   LOTEPREDNOL (LOTEMAX) 0.5 % OPHTHALMIC SUSPENSION       METFORMIN (GLUCOPHAGE) 500 MG TABLET    Take 1 tablet (500 mg total) by mouth 2 (two) times daily.   MULTIPLE VITAMIN (MULTIVITAMIN WITH MINERALS) TABS TABLET    Take 1 tablet by mouth daily.   NITROGLYCERIN (NITROSTAT) 0.4 MG SL TABLET    Place 1 tablet (0.4 mg total) under the tongue every 5 (five) minutes x 3 doses as needed for chest pain.   OMEGA-3 ACID ETHYL ESTERS (LOVAZA) 1 G CAPSULE    TAKE ONE CAPSULE BY MOUTH EVERY MORNING and TAKE ONE CAPSULE BY MOUTH EVERYDAY AT BEDTIME   PANTOPRAZOLE (PROTONIX) 40 MG TABLET    TAKE ONE TABLET BY MOUTH ONCE DAILY   PREGABALIN (LYRICA) 75 MG CAPSULE    Take 1 capsule (75 mg total) by mouth 2 (two) times daily.   ROSUVASTATIN (CRESTOR) 10 MG TABLET    Take 1 tablet (10 mg total) by mouth daily.   SEMAGLUTIDE, 1 MG/DOSE, (OZEMPIC, 1 MG/DOSE,) 4 MG/3ML SOPN    Inject 1 mg into the skin once a week.   TAMSULOSIN (FLOMAX) 0.4 MG CAPS CAPSULE    Take 0.4 mg by mouth daily.  Modified Medications   No medications on file  Discontinued Medications   No medications on file    Subjective: Alex Jackson is in for an unscheduled follow-up visit.  He  diabetes and peripheral neuropathy who developed a blister on the plantar surface of his left great toe on 10/02/2020.  The blister opened and drained and he has had a plantar ulcer ever since.  He was evaluated by orthopedics last year and amputation was recommended.  He did not like that recommendation.  A wound culture in January grew E. coli and Alcaligenes.  Molecular testing of a wound swab specimen on 09/08/2021 detected Enterococcus, coagulase-negative staph, Enterococcus, Staph aureus and Strep agalactiae.  He has not been on any antibiotics.  An MRI in February revealed:   MRI 06/14/2021 IMPRESSION: 1. Soft tissue ulcer at the tip of the great toe. Bone marrow edema in  the distal aspect of the first distal phalanx concerning for osteomyelitis. No drainable fluid collection to suggest an abscess. 2. Bipartite medial hallux sesamoid with marrow edema as can be seen with sesamoiditis.   He was treated with full contact casting at the wound center and 5 weeks of amoxicillin/clavulanate and trimethoprim sulfamethoxazole.  He improved fairly rapidly and at the time of his last visit he only had a very pinpoint superficial ulcer remaining. 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  He has had problems with left knee arthritis and right thigh pain.  He lives alone.  Over the past 6 to 8 weeks he has had slight enlargement of the ulcer.  He has been soaking his foot in Betadine and using topical antibiotic cream.  He is having minimal drainage.  He has not had any acute swelling, redness or pain of his toe.  He is scheduled to see podiatry next week.  Review of Systems: Review of Systems  Constitutional:  Negative for chills, diaphoresis and fever.  Musculoskeletal:  Negative for joint pain.    Past Medical History:  Diagnosis Date   CAD in native artery 03/01/2015   1. cath - DES to LAD residual non obstructive RCA disease in 2016 b. Cath s/p Successful PCI with stenting of the distal RCA into the PLOM with a DES. Balloon angioplasty of the PDA through the stent struts.    Diabetes mellitus without complication (HCC)    Edema of both lower extremities    Hyperlipidemia    Hypertension    Leg weakness    Overweight    Spinal stenosis    Ulcer of great toe (HCC)     Social History   Tobacco Use   Smoking status: Former    Packs/day: 1.00    Types: Cigarettes    Quit date: 07/03/1983    Years since quitting: 38.6   Smokeless tobacco: Never   Tobacco comments:    quit in Mar 1985  Vaping Use   Vaping Use: Never used  Substance Use Topics   Alcohol use: Yes     Alcohol/week: 0.0 standard drinks of alcohol    Comment: "one or two drinks a night"   Drug use: No    Family History  Problem Relation Age of Onset   Thyroid cancer Mother    Liver cancer Father        pancreatic   Arrhythmia Sister     Allergies  Allergen Reactions   Prednisone Other (See Comments)  Makes him crazy   Atorvastatin Other (See Comments)    Pt reports "causes lower extremity muscle aches."   Pravastatin Other (See Comments)    Pt reports "causes bilateral calf cramping."    Objective: Vitals:   02/04/22 0954  BP: 123/66  Pulse: 66  Resp: 17  SpO2: 98%  Weight: 245 lb (111.1 kg)  Height: 5\' 11"  (1.803 m)   Body mass index is 34.17 kg/m.  Physical Exam Constitutional:      Comments: He is in good spirits as usual.  Musculoskeletal:        General: No swelling or tenderness.     Comments: The area on the plantar surface of his left great toe has now opened up and is about 10 mm across.  There is no drainage or odor.  There is no surrounding erythema, unusual swelling or fluctuance.  There is no exposed bone.    Photo taken today  Photo taken 11/26/2021  Lab Results    Problem List Items Addressed This Visit       High   Plantar ulcer of left foot (HCC)    I am still hopeful that his osteomyelitis was cured with his recent antibiotic therapy.  I do not see any evidence of active infection at this time but but his pressure ulcer has enlarged.  I certainly agree with podiatry evaluation next week.  I will arrange phone follow-up in 2 weeks.        11/28/2021, MD [facility] [phone] pager   [phone] cell 02/04/2022, 10:23 AM

## 2022-02-05 DIAGNOSIS — M5431 Sciatica, right side: Secondary | ICD-10-CM | POA: Diagnosis not present

## 2022-02-05 DIAGNOSIS — M9903 Segmental and somatic dysfunction of lumbar region: Secondary | ICD-10-CM | POA: Diagnosis not present

## 2022-02-05 DIAGNOSIS — R262 Difficulty in walking, not elsewhere classified: Secondary | ICD-10-CM | POA: Diagnosis not present

## 2022-02-05 DIAGNOSIS — M25652 Stiffness of left hip, not elsewhere classified: Secondary | ICD-10-CM | POA: Diagnosis not present

## 2022-02-05 DIAGNOSIS — M5117 Intervertebral disc disorders with radiculopathy, lumbosacral region: Secondary | ICD-10-CM | POA: Diagnosis not present

## 2022-02-05 DIAGNOSIS — M25651 Stiffness of right hip, not elsewhere classified: Secondary | ICD-10-CM | POA: Diagnosis not present

## 2022-02-06 DIAGNOSIS — E1169 Type 2 diabetes mellitus with other specified complication: Secondary | ICD-10-CM | POA: Diagnosis not present

## 2022-02-06 DIAGNOSIS — I2510 Atherosclerotic heart disease of native coronary artery without angina pectoris: Secondary | ICD-10-CM | POA: Diagnosis not present

## 2022-02-06 DIAGNOSIS — I10 Essential (primary) hypertension: Secondary | ICD-10-CM | POA: Diagnosis not present

## 2022-02-06 DIAGNOSIS — E785 Hyperlipidemia, unspecified: Secondary | ICD-10-CM | POA: Diagnosis not present

## 2022-02-09 DIAGNOSIS — M5431 Sciatica, right side: Secondary | ICD-10-CM | POA: Diagnosis not present

## 2022-02-09 DIAGNOSIS — M9903 Segmental and somatic dysfunction of lumbar region: Secondary | ICD-10-CM | POA: Diagnosis not present

## 2022-02-10 DIAGNOSIS — M25651 Stiffness of right hip, not elsewhere classified: Secondary | ICD-10-CM | POA: Diagnosis not present

## 2022-02-10 DIAGNOSIS — R262 Difficulty in walking, not elsewhere classified: Secondary | ICD-10-CM | POA: Diagnosis not present

## 2022-02-10 DIAGNOSIS — M5117 Intervertebral disc disorders with radiculopathy, lumbosacral region: Secondary | ICD-10-CM | POA: Diagnosis not present

## 2022-02-10 DIAGNOSIS — M25652 Stiffness of left hip, not elsewhere classified: Secondary | ICD-10-CM | POA: Diagnosis not present

## 2022-02-11 NOTE — Telephone Encounter (Signed)
PharmD is assisting the pt with this matter. See mychart messages for further details.

## 2022-02-12 ENCOUNTER — Ambulatory Visit: Payer: PPO | Admitting: Podiatry

## 2022-02-12 DIAGNOSIS — M5431 Sciatica, right side: Secondary | ICD-10-CM | POA: Diagnosis not present

## 2022-02-12 DIAGNOSIS — M9903 Segmental and somatic dysfunction of lumbar region: Secondary | ICD-10-CM | POA: Diagnosis not present

## 2022-02-13 ENCOUNTER — Ambulatory Visit (INDEPENDENT_AMBULATORY_CARE_PROVIDER_SITE_OTHER): Payer: PPO

## 2022-02-13 ENCOUNTER — Ambulatory Visit: Payer: PPO | Admitting: Podiatry

## 2022-02-13 DIAGNOSIS — M79672 Pain in left foot: Secondary | ICD-10-CM

## 2022-02-13 DIAGNOSIS — M2032 Hallux varus (acquired), left foot: Secondary | ICD-10-CM

## 2022-02-13 DIAGNOSIS — L97522 Non-pressure chronic ulcer of other part of left foot with fat layer exposed: Secondary | ICD-10-CM

## 2022-02-13 NOTE — Progress Notes (Signed)
Subjective:   Patient ID: Alex Jackson, male   DOB: 76 y.o.   MRN: [id number]   HPI Chief Complaint  Patient presents with   Foot Ulcer    Left foot hallux ulcer, started June of 2022, X-Rays done today,    76 year old male presents with concerns.  He states he had a wound since June 2022 of his left big toe.  He has seen other physicians for this including orthopedics, the wound care center.  He is also recently followed up with infectious disease.  He is no longer on any antibiotics.  Is been in a surgical shoe and has made some offloading pads to help decrease pressure of the wound.  He is continue with daily dressing changes with ointment.  Denies any fevers or chills currently.  Review of Systems  All other systems reviewed and are negative.  Past Medical History:  Diagnosis Date   CAD in native artery 03/01/2015   1. cath - DES to LAD residual non obstructive RCA disease in 2016 b. Cath s/p Successful PCI with stenting of the distal RCA into the PLOM with a DES. Balloon angioplasty of the PDA through the stent struts.    Diabetes mellitus without complication (HCC)    Edema of both lower extremities    Hyperlipidemia    Hypertension    Leg weakness    Overweight    Spinal stenosis    Ulcer of great toe [facility])     Past Surgical History:  Procedure Laterality Date   CARDIAC CATHETERIZATION N/A 03/01/2015   Procedure: Left Heart Cath and Coronary Angiography;  Surgeon: Burnell Blanks, MD;  Location: [place] [facility];  Service: Cardiovascular;  Laterality: N/A;   CORONARY STENT INTERVENTION N/A 09/28/2017   Procedure: CORONARY STENT INTERVENTION;  Surgeon: Martinique, Peter M, MD;  Location: [place] [facility];  Service: Cardiovascular;  Laterality: N/A;   LEFT HEART CATH AND CORONARY ANGIOGRAPHY N/A 09/28/2017   Procedure: LEFT HEART CATH AND CORONARY ANGIOGRAPHY;  Surgeon: Martinique, Peter M, MD;  Location: [place] [facility];  Service: Cardiovascular;  Laterality: N/A;    NO PAST SURGERIES       Current Outpatient Medications:    acetaminophen (TYLENOL) 325 MG tablet, Take 2 tablets (650 mg total) by mouth every 4 (four) hours as needed for headache or mild pain., Disp: , Rfl:    aspirin EC 81 MG tablet, Take 81 mg by mouth daily., Disp: , Rfl:    bisoprolol (ZEBETA) 10 MG tablet, TAKE ONE TABLET BY MOUTH EVERY MORNING, Disp: 90 tablet, Rfl: 3   chlorthalidone (HYGROTON) 25 MG tablet, TAKE ONE TABLET BY MOUTH ONCE DAILY, Disp: 90 tablet, Rfl: 3   cyclobenzaprine (FLEXERIL) 10 MG tablet, Take 1 tablet (10 mg total) by mouth 2 (two) times daily as needed for up to 20 doses for muscle spasms., Disp: 20 tablet, Rfl: 0   fluticasone (FLONASE) 50 MCG/ACT nasal spray, USE 1 SPRAY IN EACH NOSTRIL EVERY DAY, Disp: , Rfl: 4   furosemide (LASIX) 40 MG tablet, Take 1 tablet (40 mg total) by mouth daily., Disp: 90 tablet, Rfl: 3   hydroxypropyl methylcellulose / hypromellose (ISOPTO TEARS / GONIOVISC) 2.5 % ophthalmic solution, Place 1 drop into both eyes as needed for dry eyes., Disp: , Rfl:    lisinopril (ZESTRIL) 40 MG tablet, TAKE ONE TABLET BY MOUTH ONCE DAILY, Disp: 90 tablet, Rfl: 3   Multiple Vitamin (MULTIVITAMIN WITH MINERALS) TABS tablet, Take 1 tablet by mouth  daily., Disp: , Rfl:    nitroGLYCERIN (NITROSTAT) 0.4 MG SL tablet, Place 1 tablet (0.4 mg total) under the tongue every 5 (five) minutes x 3 doses as needed for chest pain., Disp: 25 tablet, Rfl: 4   omega-3 acid ethyl esters (LOVAZA) 1 g capsule, TAKE ONE CAPSULE BY MOUTH EVERY MORNING and TAKE ONE CAPSULE BY MOUTH EVERYDAY AT BEDTIME, Disp: 180 capsule, Rfl: 3   pantoprazole (PROTONIX) 40 MG tablet, TAKE ONE TABLET BY MOUTH ONCE DAILY, Disp: 90 tablet, Rfl: 3   rosuvastatin (CRESTOR) 10 MG tablet, Take 1 tablet (10 mg total) by mouth daily., Disp: 90 tablet, Rfl: 3   Semaglutide, 1 MG/DOSE, (OZEMPIC, 1 MG/DOSE,) 4 MG/3ML SOPN, Inject 1 mg into the skin once a week., Disp: 3 mL, Rfl: 2   tamsulosin (FLOMAX)  0.4 MG CAPS capsule, Take 0.4 mg by mouth daily., Disp: , Rfl:   Allergies  Allergen Reactions   Prednisone Other (See Comments)    Makes him crazy   Atorvastatin Other (See Comments)    Pt reports "causes lower extremity muscle aches."   Pravastatin Other (See Comments)    Pt reports "causes bilateral calf cramping."           Objective:  Physical Exam  General: AAO x3, NAD  Dermatological: Ulceration of the distal aspect of the left hallux.  Picture below was after debridement the prior debridement was covered mostly with callus.  After debridement the wound measures 1 x 0.9 x 0.2 cm without any probing, undermining or tunneling.  There is no surrounding erythema, ascending cellulitis.  There is no fluctuation or crepitation but there is no malodor.  Compared to the picture that was taken last week the wound appeared about the same prior to debridement.    Vascular: Dorsalis Pedis artery and Posterior Tibial artery pedal pulses are 2/4 bilateral with immedate capillary fill time. There is no pain with calf compression, swelling, warmth, erythema.   Neruologic: Sensation decreased  Musculoskeletal: Hallux malleus present on the left hallux. Muscular strength 5/5 in all groups tested bilateral.     Assessment:   Hallux malleus left resulting in chronic ulceration     Plan:  -Treatment options discussed including all alternatives, risks, and complications -Etiology of symptoms were discussed -X-rays were obtained and reviewed with the patient.  3 views left foot were obtained.  No definitive evidence of acute fracture.  No significant cortical changes noted at this time.  Significant hallux malleus noted. -We had a long discussion regards to the treatment options.  Send the circulatory status is adequate so I held off on ordering ABI.  He is also followed infectious disease.  I did recheck blood work now he is off of antibiotics.  I ordered a CBC, sed rate, CRP.  We will order  a foot defender boot for offloading as well as collagen, blast X to apply to the wound.  Ultimately I think if the pressure causing the wound to heal.  Can also consider straightening the toe surgically if needed in order to help salvage the toe.  He is not interested in amputation is absolutely necessary. -Monitor for any clinical signs or symptoms of infection and directed to call the office immediately should any occur or go to the ER.  Return in about 2 weeks (around 02/27/2022).  Trula Slade DPM

## 2022-02-14 ENCOUNTER — Other Ambulatory Visit (HOSPITAL_BASED_OUTPATIENT_CLINIC_OR_DEPARTMENT_OTHER): Payer: Self-pay

## 2022-02-18 ENCOUNTER — Telehealth: Payer: Self-pay | Admitting: *Deleted

## 2022-02-18 ENCOUNTER — Ambulatory Visit (INDEPENDENT_AMBULATORY_CARE_PROVIDER_SITE_OTHER): Payer: PPO | Admitting: Internal Medicine

## 2022-02-18 ENCOUNTER — Encounter: Payer: Self-pay | Admitting: Internal Medicine

## 2022-02-18 ENCOUNTER — Other Ambulatory Visit: Payer: Self-pay

## 2022-02-18 DIAGNOSIS — L97521 Non-pressure chronic ulcer of other part of left foot limited to breakdown of skin: Secondary | ICD-10-CM

## 2022-02-18 NOTE — Telephone Encounter (Signed)
Patient  is calling for the status of prescriptions for collagen, blast x that were supposed to be sent to pharmacy on file.  Please advise.

## 2022-02-18 NOTE — Progress Notes (Signed)
Virtual Visit via Video Note  I connected with Alex Jackson on 02/18/22 at 10:00 AM EDT by a video enabled telemedicine application and verified that I am speaking with the correct person using two identifiers.  Location: Patient: Home Provider: [facility]   I discussed the limitations of evaluation and management by telemedicine and the availability of in person appointments. The patient expressed understanding and agreed to proceed.  History of Present Illness: I called and spoke with Alex Jackson this morning.  He was evaluated by Dr. Celesta Gentile (podiatry) on 02/13/2022.  The recurrent plantar ulcer on his left great toe was debrided.  There was no evidence of infection.  A collagen dressing and a new boot have been ordered but Alex Jackson has not gotten either yet.  He is due to follow-up with [name] next week.   Observations/Objective:  Photo taken 02/13/2022 by Dr. Celesta Gentile  Assessment and Plan: He has a recurrent pressure induced ulcer of his left great toe but no evidence of any recurrent infection.  Follow Up Instructions: He will follow-up here as needed   I discussed the assessment and treatment plan with the patient. The patient was provided an opportunity to ask questions and all were answered. The patient agreed with the plan and demonstrated an understanding of the instructions.   The patient was advised to call back or seek an in-person evaluation if the symptoms worsen or if the condition fails to improve as anticipated.  I provided 14 minutes of non-face-to-face time during this encounter.   Michel Bickers, MD

## 2022-02-19 DIAGNOSIS — M5117 Intervertebral disc disorders with radiculopathy, lumbosacral region: Secondary | ICD-10-CM | POA: Diagnosis not present

## 2022-02-19 DIAGNOSIS — M25652 Stiffness of left hip, not elsewhere classified: Secondary | ICD-10-CM | POA: Diagnosis not present

## 2022-02-19 DIAGNOSIS — R262 Difficulty in walking, not elsewhere classified: Secondary | ICD-10-CM | POA: Diagnosis not present

## 2022-02-19 DIAGNOSIS — M25651 Stiffness of right hip, not elsewhere classified: Secondary | ICD-10-CM | POA: Diagnosis not present

## 2022-02-19 DIAGNOSIS — L97522 Non-pressure chronic ulcer of other part of left foot with fat layer exposed: Secondary | ICD-10-CM | POA: Diagnosis not present

## 2022-02-19 DIAGNOSIS — M9903 Segmental and somatic dysfunction of lumbar region: Secondary | ICD-10-CM | POA: Diagnosis not present

## 2022-02-19 DIAGNOSIS — M5431 Sciatica, right side: Secondary | ICD-10-CM | POA: Diagnosis not present

## 2022-02-20 LAB — CBC WITH DIFFERENTIAL/PLATELET
Basophils Absolute: 0.1 10*3/uL (ref 0.0–0.2)
Basos: 1 %
EOS (ABSOLUTE): 0.3 10*3/uL (ref 0.0–0.4)
Eos: 3 %
Hematocrit: 38.7 % (ref 37.5–51.0)
Hemoglobin: 13 g/dL (ref 13.0–17.7)
Immature Grans (Abs): 0.1 10*3/uL (ref 0.0–0.1)
Immature Granulocytes: 1 %
Lymphocytes Absolute: 2 10*3/uL (ref 0.7–3.1)
Lymphs: 19 %
MCH: 31.6 pg (ref 26.6–33.0)
MCHC: 33.6 g/dL (ref 31.5–35.7)
MCV: 94 fL (ref 79–97)
Monocytes Absolute: 0.9 10*3/uL (ref 0.1–0.9)
Monocytes: 9 %
Neutrophils Absolute: 7.4 10*3/uL — ABNORMAL HIGH (ref 1.4–7.0)
Neutrophils: 67 %
Platelets: 284 10*3/uL (ref 150–450)
RBC: 4.12 x10E6/uL — ABNORMAL LOW (ref 4.14–5.80)
RDW: 11.7 % (ref 11.6–15.4)
WBC: 10.7 10*3/uL (ref 3.4–10.8)

## 2022-02-20 LAB — HEMOGLOBIN A1C
Est. average glucose Bld gHb Est-mCnc: 108 mg/dL
Hgb A1c MFr Bld: 5.4 % (ref 4.8–5.6)

## 2022-02-20 LAB — SEDIMENTATION RATE: Sed Rate: 11 mm/hr (ref 0–30)

## 2022-02-20 LAB — C-REACTIVE PROTEIN: CRP: 8 mg/L (ref 0–10)

## 2022-02-24 DIAGNOSIS — M25652 Stiffness of left hip, not elsewhere classified: Secondary | ICD-10-CM | POA: Diagnosis not present

## 2022-02-24 DIAGNOSIS — L97522 Non-pressure chronic ulcer of other part of left foot with fat layer exposed: Secondary | ICD-10-CM | POA: Diagnosis not present

## 2022-02-24 DIAGNOSIS — M2032 Hallux varus (acquired), left foot: Secondary | ICD-10-CM | POA: Diagnosis not present

## 2022-02-24 DIAGNOSIS — M9903 Segmental and somatic dysfunction of lumbar region: Secondary | ICD-10-CM | POA: Diagnosis not present

## 2022-02-24 DIAGNOSIS — M25651 Stiffness of right hip, not elsewhere classified: Secondary | ICD-10-CM | POA: Diagnosis not present

## 2022-02-24 DIAGNOSIS — M5431 Sciatica, right side: Secondary | ICD-10-CM | POA: Diagnosis not present

## 2022-02-24 DIAGNOSIS — M5117 Intervertebral disc disorders with radiculopathy, lumbosacral region: Secondary | ICD-10-CM | POA: Diagnosis not present

## 2022-02-24 DIAGNOSIS — R262 Difficulty in walking, not elsewhere classified: Secondary | ICD-10-CM | POA: Diagnosis not present

## 2022-02-27 ENCOUNTER — Ambulatory Visit: Payer: PPO | Admitting: Podiatry

## 2022-02-27 ENCOUNTER — Encounter: Payer: Self-pay | Admitting: Podiatry

## 2022-02-27 DIAGNOSIS — L97522 Non-pressure chronic ulcer of other part of left foot with fat layer exposed: Secondary | ICD-10-CM | POA: Diagnosis not present

## 2022-02-27 DIAGNOSIS — M461 Sacroiliitis, not elsewhere classified: Secondary | ICD-10-CM | POA: Insufficient documentation

## 2022-02-27 DIAGNOSIS — M5416 Radiculopathy, lumbar region: Secondary | ICD-10-CM | POA: Insufficient documentation

## 2022-03-02 NOTE — Progress Notes (Signed)
Subjective: Chief Complaint  Patient presents with   Foot Ulcer    Follow up ulcer hallux left   "It look okay, brought all the supplies"   76 year old male presents the office to the above concerns.  Wound is doing about the same.  No drainage or pus.  He did get the dressing supplies apply ordered he brings him in today for make sure not to put it on.  Denies any fevers or chills.  No other concerns.  Objective: AAO x3, NAD DP/PT pulses palpable bilaterally, CRT less than 3 seconds Plantar aspect left hallux is a granular wound with hyperkeratotic periwound.  After debrided the wound today measures about the same size of 1 x 0.9 x 0.2 cm.  Prior.  It was smaller at 0.8 x 0.7 x 0.3 cm.  There is no probing to bone, and or tunneling.  There is no surrounding erythema, ascending cellulitis.  No fluctuation or crepitation present.  Hallux malleus is noted.  No pain with calf compression, swelling, warmth, erythema      Assessment: 76 year old male chronic ulceration left hallux  Plan: -All treatment options discussed with the patient including all alternatives, risks, complications.  -Medically necessary wound debridement was performed today.  Sharply debrided the wound today utilizing #312 with scalpel down to healthy, granular tissue in order promote wound healing.  There is no blood loss.  Cleaned the wound with saline.  I then applied collagen, BlastX to the wound.  Dry dressing was then applied.  I showed him how to change the dressings he is in continue with daily dressing changes.  Continue offloading.  I will follow-up on the foot defender boot for him. -Monitor for any clinical signs or symptoms of infection and directed to call the office immediately should any occur or go to the ER. -Patient encouraged to call the office with any questions, concerns, change in symptoms.   Trula Slade DPM

## 2022-03-03 ENCOUNTER — Other Ambulatory Visit: Payer: Self-pay | Admitting: Podiatry

## 2022-03-03 DIAGNOSIS — L97522 Non-pressure chronic ulcer of other part of left foot with fat layer exposed: Secondary | ICD-10-CM

## 2022-03-04 DIAGNOSIS — M5431 Sciatica, right side: Secondary | ICD-10-CM | POA: Diagnosis not present

## 2022-03-04 DIAGNOSIS — M9903 Segmental and somatic dysfunction of lumbar region: Secondary | ICD-10-CM | POA: Diagnosis not present

## 2022-03-05 ENCOUNTER — Telehealth: Payer: Self-pay

## 2022-03-05 DIAGNOSIS — R262 Difficulty in walking, not elsewhere classified: Secondary | ICD-10-CM | POA: Diagnosis not present

## 2022-03-05 DIAGNOSIS — M25651 Stiffness of right hip, not elsewhere classified: Secondary | ICD-10-CM | POA: Diagnosis not present

## 2022-03-05 DIAGNOSIS — M5117 Intervertebral disc disorders with radiculopathy, lumbosacral region: Secondary | ICD-10-CM | POA: Diagnosis not present

## 2022-03-05 DIAGNOSIS — M25652 Stiffness of left hip, not elsewhere classified: Secondary | ICD-10-CM | POA: Diagnosis not present

## 2022-03-09 DIAGNOSIS — M2032 Hallux varus (acquired), left foot: Secondary | ICD-10-CM | POA: Diagnosis not present

## 2022-03-09 DIAGNOSIS — L97522 Non-pressure chronic ulcer of other part of left foot with fat layer exposed: Secondary | ICD-10-CM | POA: Diagnosis not present

## 2022-03-09 NOTE — Progress Notes (Unsigned)
Cardiology Office Note:    Date:  03/09/2022   ID:  Alex Jackson, DOB Apr 25, 1946, MRN [id number]  PCP:  [name], MD   [facility] Providers Cardiologist:  Tobias Alexander, MD {   Referring MD: [name], MD    History of Present Illness:    Alex Jackson is a 76 y.o. male with a hx of CAD with history of NSTEMI in 2016 s/p PCI to LAD and 2019 s/p PCI to RCA and PDA, HTN, and DMII who was previously followed by Dr. Delton See who now presents to clinic for follow-up.  Per review of the record, the patient has history of NSTEMI  in 2016 and underwent PCI of the LAD.  His ejection fraction was moderately reduced but subsequently normalized.  He had recurrent non-STEMI in May 2019 and had PCI of the the distal right coronary artery with balloon angioplasty of the PDA through the stent struts. LVEF 55 to 60% with grade 1 DD on 2D echo at that time. He had lower extremity arterial Doppler in September 2021 which showed normal ABI bilaterally with mildly abnormal TBI.  Saw Dr. Kirke Corin in 10/2020 for evaluation of possible PAD. LE arterial dopplers in 01/2020 normal. No evidence of PAD on LE ABI  Was last seen in clinic on 09/08/21 where he was stable from a CV standpoint. He was suffering from neuropathy. No evidence of PAD on ABIs.  Today, ***  Past Medical History:  Diagnosis Date   CAD in native artery 03/01/2015   1. cath - DES to LAD residual non obstructive RCA disease in 2016 b. Cath s/p Successful PCI with stenting of the distal RCA into the PLOM with a DES. Balloon angioplasty of the PDA through the stent struts.    Diabetes mellitus without complication (HCC)    Edema of both lower extremities    Hyperlipidemia    Hypertension    Leg weakness    Overweight    Spinal stenosis    Ulcer of great toe [facility])     Past Surgical History:  Procedure Laterality Date   CARDIAC CATHETERIZATION N/A 03/01/2015   Procedure: Left Heart Cath and Coronary Angiography;   Surgeon: Kathleene Hazel, MD;  Location: [facility] INVASIVE [facility] LAB;  Service: Cardiovascular;  Laterality: N/A;   CORONARY STENT INTERVENTION N/A 09/28/2017   Procedure: CORONARY STENT INTERVENTION;  Surgeon: Swaziland, Peter M, MD;  Location: [facility] INVASIVE CV LAB;  Service: Cardiovascular;  Laterality: N/A;   LEFT HEART CATH AND CORONARY ANGIOGRAPHY N/A 09/28/2017   Procedure: LEFT HEART CATH AND CORONARY ANGIOGRAPHY;  Surgeon: Swaziland, Peter M, MD;  Location: [facility] INVASIVE CV LAB;  Service: Cardiovascular;  Laterality: N/A;   NO PAST SURGERIES      Current Medications: No outpatient medications have been marked as taking for the 03/11/22 encounter (Appointment) with Meriam Sprague, MD.     Allergies:   Prednisone, Atorvastatin, and Pravastatin   Social History   Socioeconomic History   Marital status: Divorced    Spouse name: Not on file   Number of children: Not on file   Years of education: Not on file   Highest education level: Not on file  Occupational History   Not on file  Tobacco Use   Smoking status: Former    Packs/day: 1.00    Types: Cigarettes    Quit date: 07/03/1983    Years since quitting: 38.7   Smokeless tobacco: Never   Tobacco comments:    quit in  Mar 1985  Vaping Use   Vaping Use: Never used  Substance and Sexual Activity   Alcohol use: Yes    Alcohol/week: 0.0 standard drinks of alcohol    Comment: "one or two drinks a night"   Drug use: No   Sexual activity: Not on file  Other Topics Concern   Not on file  Social History Narrative   Not on file   Social Determinants of Health   Financial Resource Strain: Not on file  Food Insecurity: Not on file  Transportation Needs: Not on file  Physical Activity: Not on file  Stress: Not on file  Social Connections: Not on file     Family History: The patient's family history includes Arrhythmia in his sister; Liver cancer in his father; Thyroid cancer in his mother.  ROS:   Review of Systems   Constitutional:  Negative for chills and fever.  HENT:  Negative for congestion and nosebleeds.   Eyes:  Negative for pain.  Respiratory:  Negative for sputum production and shortness of breath.   Cardiovascular:  Negative for chest pain, palpitations, orthopnea, claudication, leg swelling and PND.  Gastrointestinal:  Negative for constipation and melena.  Genitourinary:  Positive for frequency. Negative for hematuria.  Musculoskeletal:  Positive for back pain, joint pain and myalgias. Negative for falls.  Neurological:  Positive for focal weakness (Bilateral LE). Negative for dizziness and seizures.  Endo/Heme/Allergies:  Negative for environmental allergies.  Psychiatric/Behavioral:  Negative for hallucinations and substance abuse.      EKGs/Labs/Other Studies Reviewed:    The following studies were reviewed today:  Bilateral LE Venous Doppler 02/16/2021: Other Findings:  Peripheral edema noted.   IMPRESSION: No significant DVT in either extremity.   Left knee joint effusion.  ABI Doppler 11/18/2020: Bilateral ABIs appear essentially unchanged compared to prior study on  01/12/2020. Bilateral TBIs appear increased compared to prior study on  01/12/2020.     Summary:  Right: Resting right ankle-brachial index is within normal range. No  evidence of significant right lower extremity arterial disease. The right  toe-brachial index is normal.   TBIs increased by .35.   Left: Resting left ankle-brachial index is within normal range. No  evidence of significant left lower extremity arterial disease. The left  toe-brachial index is normal.   TBIs increased by .49.   [place] 2019: Previously placed Prox LAD drug eluting stent is widely patent. Mid RCA to Dist RCA lesion is 30% stenosed. RPDA lesion is 20% stenosed. Dist RCA lesion is 90% stenosed with 99% stenosed side branch in Post Atrio. A drug-eluting stent was successfully placed using a STENT SYNERGY DES [id number]. Post  intervention, there is a 0% residual stenosis. Post intervention, the side branch was reduced to 0% residual stenosis. Ost RPDA lesion is 60% stenosed. Balloon angioplasty was performed. Post intervention, there is a 0% residual stenosis. LV end diastolic pressure is normal.   1. Single vessel obstructive CAD  2. Patent stent in the LAD 3. Normal LVEDP 4. Successful PCI with stenting of the distal RCA into the PLOM with a DES. Balloon angioplasty of the PDA through the stent struts.    Plan: DAPT for one year. Risk factor modification. Anticipate DC in am.  TTE 09/2017: Study Conclusions   - Left ventricle: The cavity size was normal. Wall thickness was    increased in a pattern of mild LVH. Systolic function was normal.    The estimated ejection fraction was in the range of 55% to 60%.  Wall motion was normal; there were no regional wall motion    abnormalities. Doppler parameters are consistent with abnormal    left ventricular relaxation (grade 1 diastolic dysfunction).  - Mitral valve: Valve area by pressure half-time: 1.67 cm^2.   Impressions:   - Normal LV systolic function; mild diastolic dysfunction; mild    LVH; trace MR and TR.   EKG:  EKG is personally reviewed. 09/08/2021: Sinus rhythm. First degree AV block. Rate 73 bpm.   Recent Labs: 10/23/2021: BUN 20; Creat 0.96; Potassium 3.9; Sodium 137 02/19/2022: Hemoglobin 13.0; Platelets 284   Recent Lipid Panel    Component Value Date/Time   CHOL 141 06/26/2019 0845   TRIG 242 (H) 06/26/2019 0845   HDL 32 (L) 06/26/2019 0845   CHOLHDL 4.4 06/26/2019 0845   CHOLHDL 5.4 09/27/2017 0222   VLDL UNABLE TO CALCULATE IF TRIGLYCERIDE OVER 400 mg/dL 50/53/9767 3419   LDLCALC 69 06/26/2019 0845     Risk Assessment/Calculations:           Physical Exam:    VS:  There were no vitals taken for this visit.    Wt Readings from Last 3 Encounters:  02/04/22 245 lb (111.1 kg)  11/20/21 244 lb (110.7 kg)  10/23/21  253 lb (114.8 kg)     GEN: Well nourished, well developed in no acute distress HEENT: Normal NECK: No JVD; No carotid bruits CARDIAC: RRR, no murmurs, rubs, gallops RESPIRATORY:  Clear to auscultation without rales, wheezing or rhonchi  ABDOMEN: Soft, non-tender, non-distended MUSCULOSKELETAL:  Trace ankle edema, warm SKIN: Warm and dry NEUROLOGIC:  Alert and oriented x 3 PSYCHIATRIC:  Normal affect   ASSESSMENT:    No diagnosis found.  PLAN:    In order of problems listed above:  #CAD with history of NSTEMI: Patient with history of NSTEMI in 2016 with PCI to LAD and recurrent NSTEMI in 2019 s/p PCI to RCA and PDA. TTE 09/2017 with LVEF 55-60%, G1DD, normal RV, no significant valve disease. Currently, feels well with no anginal symptoms. Mobility limited due to orthopedic issues -Continue ASA 81mg  daily -Continue crestor 10mg  daily -Continue bisoprolol 10mg  daily -Continue lisinopril 40mg  daily  #Chronic Diastolic HF: Compensated and euvolemic on exam today. Currently with NYHA class II symptoms. -Continue lasix 40mg  weekly  -Continue bisoprolol 10mg  daily -Continue lisinopril 40mg  daily -Low Na diet  #HTN: Well controlled and at goal <120/80s at home. -Continue bisoprolol 10mg  daily -Continue lisinopril 40mg  daily -Continue chlorthalidone 25mg  daily  #HLD: -Continue crestor 10mg  daily  #DMII: -On metformin and ozempic  #Obesity: BMI 35. -Will look into coverage of GLP-1 receptor agonist -Continue lifestyle modifications  Exercise recommendations: Goal of exercising for at least 30 minutes a day, at least 5 times per week.  Please exercise to a moderate exertion.  This means that while exercising it is difficult to speak in full sentences, however you are not so short of breath that you feel you must stop, and not so comfortable that you can carry on a full conversation.  Exertion level should be approximately a 5/10, if 10 is the most exertion you can  perform.  Diet recommendations: Recommend a heart healthy diet such as the Mediterranean diet.  This diet consists of plant based foods, healthy fats, lean meats, olive oil.  It suggests limiting the intake of simple carbohydrates such as white breads, pastries, and pastas.  It also limits the amount of red meat, wine, and dairy products such as cheese that one should consume  on a daily basis.          Follow-up:  6 months.  Medication Adjustments/Labs and Tests Ordered: Current medicines are reviewed at length with the patient today.  Concerns regarding medicines are outlined above.   No orders of the defined types were placed in this encounter.  No orders of the defined types were placed in this encounter.  There are no Patient Instructions on file for this visit.     Signed, Meriam Sprague, MD  03/09/2022 8:32 PM    [facility]

## 2022-03-09 NOTE — Telephone Encounter (Signed)
No further evaluation is needed on my end.

## 2022-03-10 DIAGNOSIS — M25652 Stiffness of left hip, not elsewhere classified: Secondary | ICD-10-CM | POA: Diagnosis not present

## 2022-03-10 DIAGNOSIS — M25651 Stiffness of right hip, not elsewhere classified: Secondary | ICD-10-CM | POA: Diagnosis not present

## 2022-03-10 DIAGNOSIS — M5117 Intervertebral disc disorders with radiculopathy, lumbosacral region: Secondary | ICD-10-CM | POA: Diagnosis not present

## 2022-03-10 DIAGNOSIS — R262 Difficulty in walking, not elsewhere classified: Secondary | ICD-10-CM | POA: Diagnosis not present

## 2022-03-11 ENCOUNTER — Ambulatory Visit: Payer: PPO | Attending: Cardiology | Admitting: Cardiology

## 2022-03-11 ENCOUNTER — Encounter: Payer: Self-pay | Admitting: Cardiology

## 2022-03-11 VITALS — BP 116/74 | HR 74 | Ht 71.0 in | Wt 245.0 lb

## 2022-03-11 DIAGNOSIS — I255 Ischemic cardiomyopathy: Secondary | ICD-10-CM

## 2022-03-11 DIAGNOSIS — I25110 Atherosclerotic heart disease of native coronary artery with unstable angina pectoris: Secondary | ICD-10-CM | POA: Diagnosis not present

## 2022-03-11 DIAGNOSIS — I10 Essential (primary) hypertension: Secondary | ICD-10-CM

## 2022-03-11 DIAGNOSIS — I2510 Atherosclerotic heart disease of native coronary artery without angina pectoris: Secondary | ICD-10-CM

## 2022-03-11 DIAGNOSIS — M9903 Segmental and somatic dysfunction of lumbar region: Secondary | ICD-10-CM | POA: Diagnosis not present

## 2022-03-11 DIAGNOSIS — Z955 Presence of coronary angioplasty implant and graft: Secondary | ICD-10-CM

## 2022-03-11 DIAGNOSIS — M5431 Sciatica, right side: Secondary | ICD-10-CM | POA: Diagnosis not present

## 2022-03-11 DIAGNOSIS — I5032 Chronic diastolic (congestive) heart failure: Secondary | ICD-10-CM | POA: Diagnosis not present

## 2022-03-11 DIAGNOSIS — E119 Type 2 diabetes mellitus without complications: Secondary | ICD-10-CM

## 2022-03-11 DIAGNOSIS — E785 Hyperlipidemia, unspecified: Secondary | ICD-10-CM | POA: Diagnosis not present

## 2022-03-11 DIAGNOSIS — E669 Obesity, unspecified: Secondary | ICD-10-CM

## 2022-03-11 LAB — BASIC METABOLIC PANEL
BUN/Creatinine Ratio: 21 (ref 10–24)
BUN: 25 mg/dL (ref 8–27)
CO2: 25 mmol/L (ref 20–29)
Calcium: 9.9 mg/dL (ref 8.6–10.2)
Chloride: 100 mmol/L (ref 96–106)
Creatinine, Ser: 1.17 mg/dL (ref 0.76–1.27)
Glucose: 95 mg/dL (ref 70–99)
Potassium: 4.4 mmol/L (ref 3.5–5.2)
Sodium: 139 mmol/L (ref 134–144)
eGFR: 65 mL/min/{1.73_m2} (ref >59)

## 2022-03-11 LAB — LIPID PANEL
Chol/HDL Ratio: 4.1 ratio (ref 0.0–5.0)
Cholesterol, Total: 122 mg/dL (ref 100–199)
HDL: 30 mg/dL — ABNORMAL LOW (ref >39)
LDL Chol Calc (NIH): 69 mg/dL (ref 0–99)
Triglycerides: 126 mg/dL (ref 0–149)
VLDL Cholesterol Cal: 23 mg/dL (ref 5–40)

## 2022-03-11 LAB — PSA: Prostate Specific Ag, Serum: 9 ng/mL — ABNORMAL HIGH (ref 0.0–4.0)

## 2022-03-11 MED ORDER — FUROSEMIDE 40 MG PO TABS: 40.0000 mg | ORAL_TABLET | Freq: Every day | ORAL | 3 refills | Status: DC | PRN

## 2022-03-11 NOTE — Progress Notes (Signed)
Cardiology Office Note:    Date:  03/11/2022   ID:  Alex PrestoRobert W Pitney, DOB May 01, 1946, MRN [id number]  PCP:  Farris HasMorrow, Aaron, MD   [facility] Providers Cardiologist:  Tobias AlexanderKatarina Nelson, MD {  Referring MD: Farris HasMorrow, Aaron, MD    History of Present Illness:    Alex PrestoRobert W Sunderlin is a 76 y.o. male with a hx of CAD with history of NSTEMI in 2016 s/p PCI to LAD and 2019 s/p PCI to RCA and PDA, HTN, and DMII who was previously followed by Dr. Delton SeeNelson who now presents to clinic for follow-up.  Per review of the record, the patient has history of NSTEMI  in 2016 and underwent PCI of the LAD.  His ejection fraction was moderately reduced but subsequently normalized.  He had recurrent non-STEMI in May 2019 and had PCI of the the distal right coronary artery with balloon angioplasty of the PDA through the stent struts. LVEF 55 to 60% with grade 1 DD on 2D echo at that time. He had lower extremity arterial Doppler in September 2021 which showed normal ABI bilaterally with mildly abnormal TBI.  Saw Dr. Kirke CorinArida in 10/2020 for evaluation of possible PAD. LE arterial dopplers in 01/2020 normal. No evidence of PAD on LE ABI  Was last seen in clinic on 09/08/21 where he was stable from a CV standpoint. He was suffering from neuropathy. No evidence of PAD on ABIs.  Today, the patient states that he is feeling great aside from seasonal and environmental allergies (most notably watery eyes). Occasionally he feels fatigued, but he attributes this to his age.  During a follow-up visit for an infection in his toe, it was found his A1c was 5.4 on Ozempic. A few days ago his dose was increased to 1 mg. He is no longer on metformin. He has lost 20 lbs so far, and is currently maintaining 245 lbs.  For his LE weakness he continues to participate in physical therapy. He takes furosemide as needed, but has not taken this in a while. No significant issues with edema.  He denies any palpitations, chest pain, or shortness of  breath. No lightheadedness, headaches, syncope, orthopnea, or PND.   Past Medical History:  Diagnosis Date   CAD in native artery 03/01/2015   1. cath - DES to LAD residual non obstructive RCA disease in 2016 b. Cath s/p Successful PCI with stenting of the distal RCA into the PLOM with a DES. Balloon angioplasty of the PDA through the stent struts.    Diabetes mellitus without complication (HCC)    Edema of both lower extremities    Hyperlipidemia    Hypertension    Leg weakness    Overweight    Spinal stenosis    Ulcer of great toe [facility](HCC)     Past Surgical History:  Procedure Laterality Date   CARDIAC CATHETERIZATION N/A 03/01/2015   Procedure: Left Heart Cath and Coronary Angiography;  Surgeon: Kathleene Hazelhristopher D McAlhany, MD;  [facility];  Service: Cardiovascular;  Laterality: N/A;   CORONARY STENT INTERVENTION N/A 09/28/2017   Procedure: CORONARY STENT INTERVENTION;  Surgeon: SwazilandJordan, Peter M, MD;  [facility];  Service: Cardiovascular;  Laterality: N/A;   LEFT HEART CATH AND CORONARY ANGIOGRAPHY N/A 09/28/2017   Procedure: LEFT HEART CATH AND CORONARY ANGIOGRAPHY;  Surgeon: SwazilandJordan, Peter M, MD;  [facility];  Service: Cardiovascular;  Laterality: N/A;   NO PAST SURGERIES      Current Medications: Current  Meds  Medication Sig   acetaminophen (TYLENOL) 325 MG tablet Take 2 tablets (650 mg total) by mouth every 4 (four) hours as needed for headache or mild pain.   aspirin EC 81 MG tablet Take 81 mg by mouth daily.   bisoprolol (ZEBETA) 10 MG tablet TAKE ONE TABLET BY MOUTH EVERY MORNING   chlorthalidone (HYGROTON) 25 MG tablet TAKE ONE TABLET BY MOUTH ONCE DAILY   fluticasone (FLONASE) 50 MCG/ACT nasal spray USE 1 SPRAY IN EACH NOSTRIL EVERY DAY   hydroxypropyl methylcellulose / hypromellose (ISOPTO TEARS / GONIOVISC) 2.5 % ophthalmic solution Place 1 drop into both eyes as needed for dry eyes.   lisinopril (ZESTRIL) 40 MG tablet TAKE  ONE TABLET BY MOUTH ONCE DAILY   Multiple Vitamin (MULTIVITAMIN WITH MINERALS) TABS tablet Take 1 tablet by mouth daily.   nitroGLYCERIN (NITROSTAT) 0.4 MG SL tablet Place 1 tablet (0.4 mg total) under the tongue every 5 (five) minutes x 3 doses as needed for chest pain.   pantoprazole (PROTONIX) 40 MG tablet TAKE ONE TABLET BY MOUTH ONCE DAILY   rosuvastatin (CRESTOR) 10 MG tablet Take 1 tablet (10 mg total) by mouth daily.   Semaglutide, 1 MG/DOSE, (OZEMPIC, 1 MG/DOSE,) 4 MG/3ML SOPN Inject 1 mg into the skin once a week.   tamsulosin (FLOMAX) 0.4 MG CAPS capsule Take 0.4 mg by mouth daily.     Allergies:   Prednisone, Atorvastatin, and Pravastatin   Social History   Socioeconomic History   Marital status: Divorced    Spouse name: Not on file   Number of children: Not on file   Years of education: Not on file   Highest education level: Not on file  Occupational History   Not on file  Tobacco Use   Smoking status: Former    Packs/day: 1.00    Types: Cigarettes    Quit date: 07/03/1983    Years since quitting: 38.7   Smokeless tobacco: Never   Tobacco comments:    quit in Mar 1985  Vaping Use   Vaping Use: Never used  Substance and Sexual Activity   Alcohol use: Yes    Alcohol/week: 0.0 standard drinks of alcohol    Comment: "one or two drinks a night"   Drug use: No   Sexual activity: Not on file  Other Topics Concern   Not on file  Social History Narrative   Not on file   Social Determinants of Health   Financial Resource Strain: Not on file  Food Insecurity: Not on file  Transportation Needs: Not on file  Physical Activity: Not on file  Stress: Not on file  Social Connections: Not on file     Family History: The patient's family history includes Arrhythmia in his sister; Liver cancer in his father; Thyroid cancer in his mother.  ROS:   Review of Systems  Constitutional:  Positive for malaise/fatigue. Negative for chills and fever.  HENT:  Negative for  congestion and nosebleeds.   Eyes:  Negative for pain.  Respiratory:  Negative for sputum production and shortness of breath.   Cardiovascular:  Negative for chest pain, palpitations, orthopnea, claudication, leg swelling and PND.  Gastrointestinal:  Negative for constipation and melena.  Genitourinary:  Positive for frequency. Negative for hematuria.  Musculoskeletal:  Positive for back pain, joint pain and myalgias. Negative for falls.  Neurological:  Positive for focal weakness (Bilateral LE). Negative for dizziness and seizures.  Endo/Heme/Allergies:  Positive for environmental allergies.  Psychiatric/Behavioral:  Negative for hallucinations  and substance abuse.      EKGs/Labs/Other Studies Reviewed:    The following studies were reviewed today:  Bilateral LE Venous Doppler 02/16/2021: Other Findings:  Peripheral edema noted.   IMPRESSION: No significant DVT in either extremity.   Left knee joint effusion.  ABI Doppler 11/18/2020: Bilateral ABIs appear essentially unchanged compared to prior study on  01/12/2020. Bilateral TBIs appear increased compared to prior study on  01/12/2020.     Summary:  Right: Resting right ankle-brachial index is within normal range. No  evidence of significant right lower extremity arterial disease. The right  toe-brachial index is normal.   TBIs increased by .35.   Left: Resting left ankle-brachial index is within normal range. No  evidence of significant left lower extremity arterial disease. The left  toe-brachial index is normal.   TBIs increased by .49.   LHC 2019: Previously placed Prox LAD drug eluting stent is widely patent. Mid RCA to Dist RCA lesion is 30% stenosed. RPDA lesion is 20% stenosed. Dist RCA lesion is 90% stenosed with 99% stenosed side branch in Post Atrio. A drug-eluting stent was successfully placed using a STENT SYNERGY DES [id number]. Post intervention, there is a 0% residual stenosis. Post intervention, the  side branch was reduced to 0% residual stenosis. Ost RPDA lesion is 60% stenosed. Balloon angioplasty was performed. Post intervention, there is a 0% residual stenosis. LV end diastolic pressure is normal.   1. Single vessel obstructive CAD  2. Patent stent in the LAD 3. Normal LVEDP 4. Successful PCI with stenting of the distal RCA into the PLOM with a DES. Balloon angioplasty of the PDA through the stent struts.    Plan: DAPT for one year. Risk factor modification. Anticipate DC in am.  TTE 09/2017: Study Conclusions   - Left ventricle: The cavity size was normal. Wall thickness was    increased in a pattern of mild LVH. Systolic function was normal.    The estimated ejection fraction was in the range of 55% to 60%.    Wall motion was normal; there were no regional wall motion    abnormalities. Doppler parameters are consistent with abnormal    left ventricular relaxation (grade 1 diastolic dysfunction).  - Mitral valve: Valve area by pressure half-time: 1.67 cm^2.   Impressions:   - Normal LV systolic function; mild diastolic dysfunction; mild    LVH; trace MR and TR.   EKG:  EKG is personally reviewed. 03/11/2022:  EKG was not ordered.   09/08/2021: Sinus rhythm. First degree AV block. Rate 73 bpm.   Recent Labs: 10/23/2021: BUN 20; Creat 0.96; Potassium 3.9; Sodium 137 02/19/2022: Hemoglobin 13.0; Platelets 284   Recent Lipid Panel    Component Value Date/Time   CHOL 141 06/26/2019 0845   TRIG 242 (H) 06/26/2019 0845   HDL 32 (L) 06/26/2019 0845   CHOLHDL 4.4 06/26/2019 0845   CHOLHDL 5.4 09/27/2017 0222   VLDL UNABLE TO CALCULATE IF TRIGLYCERIDE OVER 400 mg/dL 60/73/7106 2694   LDLCALC 69 06/26/2019 0845     Risk Assessment/Calculations:          Physical Exam:    VS:  BP 116/74   Pulse 74   Ht 5\' 11"  (1.803 m)   Wt 245 lb (111.1 kg)   SpO2 97%   BMI 34.17 kg/m     Wt Readings from Last 3 Encounters:  03/11/22 245 lb (111.1 kg)  02/04/22 245 lb  (111.1 kg)  11/20/21 244 lb (110.7 kg)  GEN: Well nourished, well developed in no acute distress HEENT: Normal NECK: No JVD; No carotid bruits CARDIAC: RRR, no murmurs, rubs, gallops RESPIRATORY:  CTAB, no wheezes ABDOMEN: Soft, non-tender, non-distended MUSCULOSKELETAL:  No edema, warm SKIN: Warm and dry NEUROLOGIC:  Alert and oriented x 3 PSYCHIATRIC:  Normal affect   ASSESSMENT:    1. Coronary artery disease involving native coronary artery of native heart with unstable angina pectoris (HCC)   2. Chronic diastolic CHF (congestive heart failure) (HCC)   3. Coronary artery disease involving native coronary artery of native heart without angina pectoris   4. Essential hypertension   5. S/P drug eluting coronary stent placement   6. Hyperlipidemia, unspecified hyperlipidemia type   7. Diabetes mellitus with coincident hypertension (HCC)   8. Obesity (BMI 30-39.9)     PLAN:    In order of problems listed above:  #CAD with history of NSTEMI: Patient with history of NSTEMI in 2016 with PCI to LAD and recurrent NSTEMI in 2019 s/p PCI to RCA and PDA. TTE 09/2017 with LVEF 55-60%, G1DD, normal RV, no significant valve disease. Currently, feels well with no anginal symptoms. Mobility limited due to orthopedic issues -Continue ASA 81mg  daily -Continue crestor 10mg  daily -Continue bisoprolol 10mg  daily -Continue lisinopril 40mg  daily  #Chronic Diastolic HF: Compensated and euvolemic on exam today. Currently with NYHA class II symptoms. -Continue lasix 40mg  prn for edema/weight gain -Continue bisoprolol 10mg  daily -Continue lisinopril 40mg  daily -Low Na diet  #HTN: Well controlled and at goal <130/90s at home. -Continue bisoprolol 10mg  daily -Continue lisinopril 40mg  daily -Continue chlorthalidone 25mg  daily  #HLD: -Continue crestor 10mg  daily -Check lipids today -Goal LDL<70  #DMII: -On ozempic -A1C 5.4  #Obesity: BMI 34. -Doing well and lost 20lbs -Continue  lifestyle modifications  Exercise recommendations: Goal of exercising for at least 30 minutes a day, at least 5 times per week.  Please exercise to a moderate exertion.  This means that while exercising it is difficult to speak in full sentences, however you are not so short of breath that you feel you must stop, and not so comfortable that you can carry on a full conversation.  Exertion level should be approximately a 5/10, if 10 is the most exertion you can perform.  Diet recommendations: Recommend a heart healthy diet such as the Mediterranean diet.  This diet consists of plant based foods, healthy fats, lean meats, olive oil.  It suggests limiting the intake of simple carbohydrates such as white breads, pastries, and pastas.  It also limits the amount of red meat, wine, and dairy products such as cheese that one should consume on a daily basis.         Follow-up:  1 year.  Medication Adjustments/Labs and Tests Ordered: Current medicines are reviewed at length with the patient today.  Concerns regarding medicines are outlined above.   No orders of the defined types were placed in this encounter.  No orders of the defined types were placed in this encounter.  There are no Patient Instructions on file for this visit.   I,Mathew Stumpf,acting as a for , MD.,have documented all relevant documentation on the behalf of , MD,as directed by  , MD while in the presence of , MD.  I, , MD, have reviewed all documentation for this visit. The documentation on 03/11/22 for the exam, diagnosis, procedures, and orders are all accurate and complete.   Signed,  Shari Prows, MD  03/11/2022 8:25 AM    [facility]

## 2022-03-11 NOTE — Patient Instructions (Signed)
Medication Instructions:   START TAKING LASIX 40 MG BY MOUTH DAILY AS NEEDED FOR LOWER EXTREMITY SWELLING  *If you need a refill on your cardiac medications before your next appointment, please call your pharmacy*   Lab Work:  TODAY--BMET, LIPIDS, AND PSA LEVEL  If you have labs (blood work) drawn today and your tests are completely normal, you will receive your results only by: MyChart Message (if you have MyChart) OR A paper copy in the mail If you have any lab test that is abnormal or we need to change your treatment, we will call you to review the results.     Follow-Up: At [facility], you and your health needs are our priority.  As part of our continuing mission to provide you with exceptional heart care, we have created designated Provider Care Teams.  These Care Teams include your primary Cardiologist (physician) and Advanced Practice Providers (APPs -  Physician Assistants and Nurse Practitioners) who all work together to provide you with the care you need, when you need it.  We recommend signing up for the patient portal called "MyChart".  Sign up information is provided on this After Visit Summary.  MyChart is used to connect with patients for Virtual Visits (Telemedicine).  Patients are able to view lab/test results, encounter notes, upcoming appointments, etc.  Non-urgent messages can be sent to your provider as well.   To learn more about what you can do with MyChart, go to [email or web address].    Your next appointment:   1 year(s)  The format for your next appointment:   In Person  Provider:   DR. Shari Prows   Important Information About Sugar

## 2022-03-13 ENCOUNTER — Ambulatory Visit: Payer: PPO | Admitting: Podiatry

## 2022-03-16 ENCOUNTER — Ambulatory Visit: Payer: PPO | Admitting: Podiatry

## 2022-03-16 DIAGNOSIS — M2032 Hallux varus (acquired), left foot: Secondary | ICD-10-CM

## 2022-03-16 DIAGNOSIS — R972 Elevated prostate specific antigen [PSA]: Secondary | ICD-10-CM | POA: Diagnosis not present

## 2022-03-16 DIAGNOSIS — L97522 Non-pressure chronic ulcer of other part of left foot with fat layer exposed: Secondary | ICD-10-CM | POA: Diagnosis not present

## 2022-03-16 DIAGNOSIS — R351 Nocturia: Secondary | ICD-10-CM | POA: Diagnosis not present

## 2022-03-17 DIAGNOSIS — M25651 Stiffness of right hip, not elsewhere classified: Secondary | ICD-10-CM | POA: Diagnosis not present

## 2022-03-17 DIAGNOSIS — M5117 Intervertebral disc disorders with radiculopathy, lumbosacral region: Secondary | ICD-10-CM | POA: Diagnosis not present

## 2022-03-17 DIAGNOSIS — M25652 Stiffness of left hip, not elsewhere classified: Secondary | ICD-10-CM | POA: Diagnosis not present

## 2022-03-17 DIAGNOSIS — R262 Difficulty in walking, not elsewhere classified: Secondary | ICD-10-CM | POA: Diagnosis not present

## 2022-03-18 NOTE — Progress Notes (Signed)
Subjective: Chief Complaint  Patient presents with   Foot Ulcer    Left foot ulcer hallux, patient has a new ulcer on the hallux which started 4 days ago, patient denies any pain, little drainage, TX: patient received the defender boot    76 year old male presents the office to the above concerns.  States that he was on his feet a lot over the weekend he did not wrap the toe he developed a blister on the side of his toe but he thinks it is already getting better.  He is recently started a foot defender boot to help offload.  Denies any drainage or pus.  No swelling or redness that he reports.  No fevers or chills.     Objective: AAO x3, NAD DP/PT pulses palpable bilaterally, CRT less than 3 seconds Plantar aspect left hallux is a granular wound with hyperkeratotic periwound.  After debrided the wound today measures about the same size of 0.9 x 0.8 x 0.2 cm.  It measured 1.8 x 1.65 0.2 cm.  Is also a superficial wound present medial aspect the hallux IPJ with a granular base.  No fluctuance or crepitation.  No malodor.  Hallux malleus is noted.  No pain with calf compression, swelling, warmth, erythema         Assessment: 76 year old male chronic ulceration left hallux  Plan: -All treatment options discussed with the patient including all alternatives, risks, complications.  -Medically necessary wound debridement was performed today.  Sharply debrided the wound today utilizing #312 with scalpel down to healthy, granular tissue in order promote wound healing.  There is no blood loss.  Cleaned the wound with saline.  I then applied collagen to the wound.  Dry dressing was then applied.  I showed him how to change the dressings he is in continue with daily dressing changes.  Continue offloading.  -Also applied a collagen to the medial ulceration -Continue foot defender to help offload. -Monitor for any clinical signs or symptoms of infection and directed to call the office immediately  should any occur or go to the ER. -Patient encouraged to call the office with any questions, concerns, change in symptoms.   Vivi Barrack DPM

## 2022-03-19 ENCOUNTER — Telehealth: Payer: Self-pay | Admitting: Podiatry

## 2022-03-19 DIAGNOSIS — M9903 Segmental and somatic dysfunction of lumbar region: Secondary | ICD-10-CM | POA: Diagnosis not present

## 2022-03-19 DIAGNOSIS — M5431 Sciatica, right side: Secondary | ICD-10-CM | POA: Diagnosis not present

## 2022-03-19 NOTE — Telephone Encounter (Signed)
Patient is calling he wants to know the charges for a boot he was given at his last appointment. He left message on voicemail .

## 2022-03-24 DIAGNOSIS — E1169 Type 2 diabetes mellitus with other specified complication: Secondary | ICD-10-CM | POA: Diagnosis not present

## 2022-03-24 DIAGNOSIS — Z0000 Encounter for general adult medical examination without abnormal findings: Secondary | ICD-10-CM | POA: Diagnosis not present

## 2022-03-24 DIAGNOSIS — E785 Hyperlipidemia, unspecified: Secondary | ICD-10-CM | POA: Diagnosis not present

## 2022-03-24 DIAGNOSIS — I2510 Atherosclerotic heart disease of native coronary artery without angina pectoris: Secondary | ICD-10-CM | POA: Diagnosis not present

## 2022-03-24 DIAGNOSIS — L97509 Non-pressure chronic ulcer of other part of unspecified foot with unspecified severity: Secondary | ICD-10-CM | POA: Diagnosis not present

## 2022-03-24 DIAGNOSIS — I10 Essential (primary) hypertension: Secondary | ICD-10-CM | POA: Diagnosis not present

## 2022-03-24 DIAGNOSIS — Z23 Encounter for immunization: Secondary | ICD-10-CM | POA: Diagnosis not present

## 2022-03-24 DIAGNOSIS — R972 Elevated prostate specific antigen [PSA]: Secondary | ICD-10-CM | POA: Diagnosis not present

## 2022-03-25 DIAGNOSIS — M9903 Segmental and somatic dysfunction of lumbar region: Secondary | ICD-10-CM | POA: Diagnosis not present

## 2022-03-25 DIAGNOSIS — M25651 Stiffness of right hip, not elsewhere classified: Secondary | ICD-10-CM | POA: Diagnosis not present

## 2022-03-25 DIAGNOSIS — M5431 Sciatica, right side: Secondary | ICD-10-CM | POA: Diagnosis not present

## 2022-03-25 DIAGNOSIS — M5117 Intervertebral disc disorders with radiculopathy, lumbosacral region: Secondary | ICD-10-CM | POA: Diagnosis not present

## 2022-03-25 DIAGNOSIS — M25652 Stiffness of left hip, not elsewhere classified: Secondary | ICD-10-CM | POA: Diagnosis not present

## 2022-03-25 DIAGNOSIS — R262 Difficulty in walking, not elsewhere classified: Secondary | ICD-10-CM | POA: Diagnosis not present

## 2022-03-31 ENCOUNTER — Other Ambulatory Visit (HOSPITAL_BASED_OUTPATIENT_CLINIC_OR_DEPARTMENT_OTHER): Payer: Self-pay

## 2022-03-31 ENCOUNTER — Telehealth: Payer: Self-pay | Admitting: Cardiology

## 2022-03-31 NOTE — Telephone Encounter (Signed)
Pt was asking if Dr. Shari Prows suggest that he get RSV vaccination.   Informed the him that we do advise for our patients to get the RSV vaccine, especially this year, being the numbers are so high already.  Pt verbalized understanding and agrees with this plan.

## 2022-03-31 NOTE — Telephone Encounter (Signed)
Pt calling because he wants to know if Dr. Shari Prows recommends he get the RSV shot. He states he saw that there is a certain criteria you have to meet to receive it.

## 2022-04-01 ENCOUNTER — Other Ambulatory Visit (HOSPITAL_BASED_OUTPATIENT_CLINIC_OR_DEPARTMENT_OTHER): Payer: Self-pay

## 2022-04-01 DIAGNOSIS — M5431 Sciatica, right side: Secondary | ICD-10-CM | POA: Diagnosis not present

## 2022-04-01 DIAGNOSIS — M9903 Segmental and somatic dysfunction of lumbar region: Secondary | ICD-10-CM | POA: Diagnosis not present

## 2022-04-01 MED ORDER — AREXVY 120 MCG/0.5ML IM SUSR
INTRAMUSCULAR | 0 refills | Status: DC
  Filled 2022-04-01: qty 0.5, 1d supply, fill #0

## 2022-04-02 DIAGNOSIS — M25651 Stiffness of right hip, not elsewhere classified: Secondary | ICD-10-CM | POA: Diagnosis not present

## 2022-04-02 DIAGNOSIS — R262 Difficulty in walking, not elsewhere classified: Secondary | ICD-10-CM | POA: Diagnosis not present

## 2022-04-02 DIAGNOSIS — M5117 Intervertebral disc disorders with radiculopathy, lumbosacral region: Secondary | ICD-10-CM | POA: Diagnosis not present

## 2022-04-02 DIAGNOSIS — M25652 Stiffness of left hip, not elsewhere classified: Secondary | ICD-10-CM | POA: Diagnosis not present

## 2022-04-03 ENCOUNTER — Ambulatory Visit: Payer: PPO | Admitting: Podiatry

## 2022-04-06 DIAGNOSIS — M9903 Segmental and somatic dysfunction of lumbar region: Secondary | ICD-10-CM | POA: Diagnosis not present

## 2022-04-06 DIAGNOSIS — M5431 Sciatica, right side: Secondary | ICD-10-CM | POA: Diagnosis not present

## 2022-04-07 ENCOUNTER — Ambulatory Visit
Admission: RE | Admit: 2022-04-07 | Discharge: 2022-04-07 | Disposition: A | Discharge status: Home / Self Care | Payer: PPO | Source: Ambulatory Visit | Attending: Neurological Surgery | Admitting: Neurological Surgery

## 2022-04-07 ENCOUNTER — Ambulatory Visit: Payer: PPO | Admitting: Podiatry

## 2022-04-07 DIAGNOSIS — M4726 Other spondylosis with radiculopathy, lumbar region: Secondary | ICD-10-CM | POA: Diagnosis not present

## 2022-04-07 DIAGNOSIS — M5416 Radiculopathy, lumbar region: Secondary | ICD-10-CM

## 2022-04-07 MED ORDER — METHYLPREDNISOLONE ACETATE 40 MG/ML INJ SUSP (RADIOLOG
80.0000 mg | Freq: Once | INTRAMUSCULAR | Status: AC
  Administered 2022-04-07: 80 mg via EPIDURAL

## 2022-04-07 MED ORDER — IOPAMIDOL (ISOVUE-M 200) INJECTION 41%
1.0000 mL | Freq: Once | INTRAMUSCULAR | Status: AC
  Administered 2022-04-07: 1 mL via EPIDURAL

## 2022-04-07 NOTE — Discharge Instructions (Signed)

## 2022-04-09 ENCOUNTER — Ambulatory Visit: Payer: PPO | Admitting: Podiatry

## 2022-04-10 ENCOUNTER — Ambulatory Visit (INDEPENDENT_AMBULATORY_CARE_PROVIDER_SITE_OTHER): Payer: PPO | Admitting: Podiatry

## 2022-04-10 VITALS — BP 120/62 | HR 71

## 2022-04-10 DIAGNOSIS — L97522 Non-pressure chronic ulcer of other part of left foot with fat layer exposed: Secondary | ICD-10-CM | POA: Diagnosis not present

## 2022-04-10 DIAGNOSIS — E1149 Type 2 diabetes mellitus with other diabetic neurological complication: Secondary | ICD-10-CM

## 2022-04-12 NOTE — Progress Notes (Signed)
Subjective: Chief Complaint  Patient presents with   Foot Ulcer    Left foot ulcer hallux,     76 year old male presents the office to the above concerns.  He been using the collagen, blast X.  Foot defender boot has been approved and he has not yet received this.  No drainage or pus.  No fevers or chills.  No other concerns.    Objective: AAO x3, NAD DP/PT pulses palpable bilaterally, CRT less than 3 seconds Plantar aspect left hallux is a granular wound with hyperkeratotic periwound.  Wound was mostly covered with callus and old collagen buildup.  Once was able to debride the wound it was somewhat smaller measuring 0.7 x 0.6 x 0.2 cm after debridement.  There is no probing to bone, and or tunneling.  No exposed tendon.  No surrounding erythema, ascending cellulitis.  There is no fluctuance or crepitation.  No malodor. No pain with calf compression, swelling, warmth, erythema         Assessment: 76 year old male chronic ulceration left hallux  Plan: -All treatment options discussed with the patient including all alternatives, risks, complications.  -Medically necessary wound debridement was performed today.  Sharply debrided the wound today utilizing #312 with scalpel down to healthy, granular tissue in order promote wound healing.  There is no blood loss.  Cleaned the wound with saline.  I then applied collagen, blast X to the wound.  Dry dressing was then applied.  I showed him how to change the dressings he is in continue with daily dressing changes.  Continue offloading.  -Foot defender to help offload. -Monitor for any clinical signs or symptoms of infection and directed to call the office immediately should any occur or go to the ER. -Patient encouraged to call the office with any questions, concerns, change in symptoms.   Vivi Barrack DPM

## 2022-04-14 DIAGNOSIS — M25651 Stiffness of right hip, not elsewhere classified: Secondary | ICD-10-CM | POA: Diagnosis not present

## 2022-04-14 DIAGNOSIS — M5431 Sciatica, right side: Secondary | ICD-10-CM | POA: Diagnosis not present

## 2022-04-14 DIAGNOSIS — M9903 Segmental and somatic dysfunction of lumbar region: Secondary | ICD-10-CM | POA: Diagnosis not present

## 2022-04-14 DIAGNOSIS — R262 Difficulty in walking, not elsewhere classified: Secondary | ICD-10-CM | POA: Diagnosis not present

## 2022-04-14 DIAGNOSIS — M5117 Intervertebral disc disorders with radiculopathy, lumbosacral region: Secondary | ICD-10-CM | POA: Diagnosis not present

## 2022-04-14 DIAGNOSIS — M25652 Stiffness of left hip, not elsewhere classified: Secondary | ICD-10-CM | POA: Diagnosis not present

## 2022-04-17 DIAGNOSIS — M25651 Stiffness of right hip, not elsewhere classified: Secondary | ICD-10-CM | POA: Diagnosis not present

## 2022-04-17 DIAGNOSIS — M5117 Intervertebral disc disorders with radiculopathy, lumbosacral region: Secondary | ICD-10-CM | POA: Diagnosis not present

## 2022-04-17 DIAGNOSIS — M25652 Stiffness of left hip, not elsewhere classified: Secondary | ICD-10-CM | POA: Diagnosis not present

## 2022-04-17 DIAGNOSIS — R262 Difficulty in walking, not elsewhere classified: Secondary | ICD-10-CM | POA: Diagnosis not present

## 2022-04-20 DIAGNOSIS — M5431 Sciatica, right side: Secondary | ICD-10-CM | POA: Diagnosis not present

## 2022-04-20 DIAGNOSIS — M9903 Segmental and somatic dysfunction of lumbar region: Secondary | ICD-10-CM | POA: Diagnosis not present

## 2022-04-21 DIAGNOSIS — M25651 Stiffness of right hip, not elsewhere classified: Secondary | ICD-10-CM | POA: Diagnosis not present

## 2022-04-21 DIAGNOSIS — R262 Difficulty in walking, not elsewhere classified: Secondary | ICD-10-CM | POA: Diagnosis not present

## 2022-04-21 DIAGNOSIS — M25652 Stiffness of left hip, not elsewhere classified: Secondary | ICD-10-CM | POA: Diagnosis not present

## 2022-04-21 DIAGNOSIS — M5117 Intervertebral disc disorders with radiculopathy, lumbosacral region: Secondary | ICD-10-CM | POA: Diagnosis not present

## 2022-04-22 DIAGNOSIS — L97522 Non-pressure chronic ulcer of other part of left foot with fat layer exposed: Secondary | ICD-10-CM | POA: Diagnosis not present

## 2022-04-22 DIAGNOSIS — M2032 Hallux varus (acquired), left foot: Secondary | ICD-10-CM | POA: Diagnosis not present

## 2022-04-23 DIAGNOSIS — M9903 Segmental and somatic dysfunction of lumbar region: Secondary | ICD-10-CM | POA: Diagnosis not present

## 2022-04-23 DIAGNOSIS — M5431 Sciatica, right side: Secondary | ICD-10-CM | POA: Diagnosis not present

## 2022-04-24 ENCOUNTER — Other Ambulatory Visit (HOSPITAL_BASED_OUTPATIENT_CLINIC_OR_DEPARTMENT_OTHER): Payer: Self-pay

## 2022-05-05 ENCOUNTER — Other Ambulatory Visit (HOSPITAL_BASED_OUTPATIENT_CLINIC_OR_DEPARTMENT_OTHER): Payer: Self-pay

## 2022-05-05 DIAGNOSIS — M5431 Sciatica, right side: Secondary | ICD-10-CM | POA: Diagnosis not present

## 2022-05-05 DIAGNOSIS — M25651 Stiffness of right hip, not elsewhere classified: Secondary | ICD-10-CM | POA: Diagnosis not present

## 2022-05-05 DIAGNOSIS — R262 Difficulty in walking, not elsewhere classified: Secondary | ICD-10-CM | POA: Diagnosis not present

## 2022-05-05 DIAGNOSIS — M25652 Stiffness of left hip, not elsewhere classified: Secondary | ICD-10-CM | POA: Diagnosis not present

## 2022-05-05 DIAGNOSIS — M5117 Intervertebral disc disorders with radiculopathy, lumbosacral region: Secondary | ICD-10-CM | POA: Diagnosis not present

## 2022-05-05 DIAGNOSIS — M9903 Segmental and somatic dysfunction of lumbar region: Secondary | ICD-10-CM | POA: Diagnosis not present

## 2022-05-06 ENCOUNTER — Telehealth: Payer: Self-pay | Admitting: Podiatry

## 2022-05-06 DIAGNOSIS — M25551 Pain in right hip: Secondary | ICD-10-CM | POA: Diagnosis not present

## 2022-05-06 DIAGNOSIS — Z6833 Body mass index (BMI) 33.0-33.9, adult: Secondary | ICD-10-CM | POA: Diagnosis not present

## 2022-05-06 NOTE — Telephone Encounter (Signed)
Pt called to cancel appt on 1/5 due to conflict and has a transportation responsibility and wanted to be seen the following week. I offered him to come in to see a different provider on 1/8, pt declined only wants to see Dr Jacqualyn Posey. I offered him to come in on 1/22 at 815 and he feels that this appt is too far away and requested to be added to cancellation list. He is currently scheduled on 1/22 with Dr Jacqualyn Posey, and on the waitlist.

## 2022-05-07 ENCOUNTER — Other Ambulatory Visit: Payer: Self-pay | Admitting: Neurological Surgery

## 2022-05-07 DIAGNOSIS — M25551 Pain in right hip: Secondary | ICD-10-CM

## 2022-05-08 ENCOUNTER — Ambulatory Visit: Payer: PPO | Admitting: Podiatry

## 2022-05-12 ENCOUNTER — Ambulatory Visit (INDEPENDENT_AMBULATORY_CARE_PROVIDER_SITE_OTHER): Payer: PPO | Admitting: Orthopaedic Surgery

## 2022-05-12 ENCOUNTER — Ambulatory Visit (INDEPENDENT_AMBULATORY_CARE_PROVIDER_SITE_OTHER): Payer: PPO

## 2022-05-12 ENCOUNTER — Encounter: Payer: Self-pay | Admitting: Orthopaedic Surgery

## 2022-05-12 DIAGNOSIS — M1611 Unilateral primary osteoarthritis, right hip: Secondary | ICD-10-CM | POA: Diagnosis not present

## 2022-05-12 DIAGNOSIS — M25551 Pain in right hip: Secondary | ICD-10-CM

## 2022-05-12 NOTE — Progress Notes (Signed)
The patient is a very pleasant and active 77 year old gentleman sent to me to evaluate and treat severe debilitating arthritis of his right hip.  This is been getting worse for 2 years now.  He ambulates with a cane.  He has significant back issues because of this.  He has pain in the groin and thigh pain.  He also has been dealing with neuropathy.  He recently had an ESI by his neurosurgeon and has tried physical therapy.  He takes Aleve as needed.  He says is really hard to put pressure on the right hip.  He says his left hip is normal.  At this point his right hip pain is daily and it is 10 out of 10.  It is detrimentally affecting his mobility, his quality of life and his actives daily living.  Her recent hemoglobin A1c was below 6.  He is only on aspirin.  I did review all of his notes within epic including his past medical history and current medications as well as social history.  His left hip exam is normal.  His right hip has significant decrease in rotation as well as severe pain in the groin with rotation.  An AP pelvis lateral the right hip shows severe end-stage arthritis of the right hip.  The left hip appears normal.  The right hip has impaction of the femoral head and acetabulum with end-stage arthritis including cystic changes and sclerotic changes and osteophytes.  We had a long and thorough discussion about hip replacement surgery which I have recommended for him.  I described the risks and benefits of the surgery and what to expect from an intraoperative and postoperative course.  We went over his x-rays and a hip replacement model and I gave him a handout about hip replacement surgery.  We will work on getting this scheduled in the near future.  He agrees with this as well.  All questions and concerns were addressed and answered.

## 2022-05-19 DIAGNOSIS — M25651 Stiffness of right hip, not elsewhere classified: Secondary | ICD-10-CM | POA: Diagnosis not present

## 2022-05-19 DIAGNOSIS — M5431 Sciatica, right side: Secondary | ICD-10-CM | POA: Diagnosis not present

## 2022-05-19 DIAGNOSIS — R262 Difficulty in walking, not elsewhere classified: Secondary | ICD-10-CM | POA: Diagnosis not present

## 2022-05-19 DIAGNOSIS — M25652 Stiffness of left hip, not elsewhere classified: Secondary | ICD-10-CM | POA: Diagnosis not present

## 2022-05-19 DIAGNOSIS — M5117 Intervertebral disc disorders with radiculopathy, lumbosacral region: Secondary | ICD-10-CM | POA: Diagnosis not present

## 2022-05-19 DIAGNOSIS — M9903 Segmental and somatic dysfunction of lumbar region: Secondary | ICD-10-CM | POA: Diagnosis not present

## 2022-05-21 ENCOUNTER — Other Ambulatory Visit: Payer: Self-pay | Admitting: Physician Assistant

## 2022-05-21 DIAGNOSIS — Z01818 Encounter for other preprocedural examination: Secondary | ICD-10-CM

## 2022-05-22 ENCOUNTER — Other Ambulatory Visit: Payer: Self-pay

## 2022-05-25 ENCOUNTER — Ambulatory Visit: Payer: PPO | Admitting: Podiatry

## 2022-05-26 ENCOUNTER — Other Ambulatory Visit (HOSPITAL_BASED_OUTPATIENT_CLINIC_OR_DEPARTMENT_OTHER): Payer: Self-pay

## 2022-05-26 ENCOUNTER — Ambulatory Visit: Payer: PPO | Admitting: Orthopaedic Surgery

## 2022-05-26 DIAGNOSIS — M25652 Stiffness of left hip, not elsewhere classified: Secondary | ICD-10-CM | POA: Diagnosis not present

## 2022-05-26 DIAGNOSIS — M25651 Stiffness of right hip, not elsewhere classified: Secondary | ICD-10-CM | POA: Diagnosis not present

## 2022-05-26 DIAGNOSIS — M5431 Sciatica, right side: Secondary | ICD-10-CM | POA: Diagnosis not present

## 2022-05-26 DIAGNOSIS — M5117 Intervertebral disc disorders with radiculopathy, lumbosacral region: Secondary | ICD-10-CM | POA: Diagnosis not present

## 2022-05-26 DIAGNOSIS — M9903 Segmental and somatic dysfunction of lumbar region: Secondary | ICD-10-CM | POA: Diagnosis not present

## 2022-05-26 DIAGNOSIS — R262 Difficulty in walking, not elsewhere classified: Secondary | ICD-10-CM | POA: Diagnosis not present

## 2022-05-28 NOTE — Progress Notes (Signed)
Surgical Instructions    Your procedure is scheduled on Tuesday, January 30th, 2024.   Report to [facility] Main Entrance "A" at 07:40 A.M., then check in with the Admitting office.  Call this number if you have problems the morning of surgery:  [phone]   If you have any questions prior to your surgery date call [phone]: Open Monday-Friday 8am-4pm If you experience any cold or flu symptoms such as cough, fever, chills, shortness of breath, etc. between now and your scheduled surgery, please notify us at the above number     Remember:  Do not eat after midnight the night before your surgery  You may drink clear liquids until 06:40 the morning of your surgery.   Clear liquids allowed are: Water, Non-Citrus Juices (without pulp), Carbonated Beverages, Clear Tea, Black Coffee ONLY (NO MILK, CREAM OR POWDERED CREAMER of any kind), and Gatorade  Patient Instructions  The night before surgery:  No food after midnight. ONLY clear liquids after midnight  The day of surgery (if you have diabetes): Drink ONE (1) 12 oz G2 given to you in your pre admission testing appointment by 06:40 the morning of surgery. Drink in one sitting. Do not sip.  This drink was given to you during your hospital  pre-op appointment visit.  Nothing else to drink after completing the  12 oz bottle of G2.         If you have questions, please contact your surgeon's office.     Take these medicines the morning of surgery with A SIP OF WATER:   bisoprolol (ZEBETA)  pantoprazole (PROTONIX)  rosuvastatin (CRESTOR)  tamsulosin (FLOMAX)  GEMTESA   If needed:  fluticasone (FLONASE)  nitroGLYCERIN (NITROSTAT)   Follow your surgeon's instructions on when to stop Aspirin.  If no instructions were given by your surgeon then you will need to call the office to get those instructions.     As of today, STOP taking any Aspirin (unless otherwise instructed by your surgeon) Aleve, Naproxen, Ibuprofen, Motrin,  Advil, Goody's, BC's, all herbal medications, fish oil, and all vitamins.   WHAT DO I DO ABOUT MY DIABETES MEDICATION?   Hold Semaglutide, 1 MG/DOSE, (OZEMPIC, 1 MG/DOSE,) 7 days prior to surgery  HOW TO MANAGE YOUR DIABETES BEFORE AND AFTER SURGERY  Why is it important to control my blood sugar before and after surgery? Improving blood sugar levels before and after surgery helps healing and can limit problems. A way of improving blood sugar control is eating a healthy diet by:  Eating less sugar and carbohydrates  Increasing activity/exercise  Talking with your doctor about reaching your blood sugar goals High blood sugars (greater than 180 mg/dL) can raise your risk of infections and slow your recovery, so you will need to focus on controlling your diabetes during the weeks before surgery. Make sure that the doctor who takes care of your diabetes knows about your planned surgery including the date and location.  How do I manage my blood sugar before surgery? Check your blood sugar at least 4 times a day, starting 2 days before surgery, to make sure that the level is not too high or low.  Check your blood sugar the morning of your surgery when you wake up and every 2 hours until you get to the Short Stay unit.  If your blood sugar is less than 70 mg/dL, you will need to treat for low blood sugar: Do not take insulin. Treat a low blood sugar (less than 70  mg/dL) with  cup of clear juice (cranberry or apple), 4 glucose tablets, OR glucose gel. Recheck blood sugar in 15 minutes after treatment (to make sure it is greater than 70 mg/dL). If your blood sugar is not greater than 70 mg/dL on recheck, call [phone] for further instructions. Report your blood sugar to the short stay nurse when you get to Short Stay.  If you are admitted to the hospital after surgery: Your blood sugar will be checked by the staff and you will probably be given insulin after surgery (instead of oral  diabetes medicines) to make sure you have good blood sugar levels. The goal for blood sugar control after surgery is 80-180 mg/dL.         The day of surgery:     Do not wear jewelry. Do not wear lotions, powders, cologne or deodorant. Men may shave face and neck. Do not bring valuables to the hospital.   [facility] is not responsible for any belongings or valuables.    Do NOT Smoke (Tobacco/Vaping)  24 hours prior to your procedure  If you use a CPAP at night, you may bring your mask for your overnight stay.   Contacts, glasses, hearing aids, dentures or partials may not be worn into surgery, please bring cases for these belongings   For patients admitted to the hospital, discharge time will be determined by your treatment team.   Patients discharged the day of surgery will not be allowed to drive home, and someone needs to stay with them for 24 hours.   SURGICAL WAITING ROOM VISITATION Patients having surgery or a procedure may have no more than 2 support people in the waiting area - these visitors may rotate.   Children under the age of 28 must have an adult with them who is not the patient. If the patient needs to stay at the hospital during part of their recovery, the visitor guidelines for inpatient rooms apply. Pre-op nurse will coordinate an appropriate time for 1 support person to accompany patient in pre-op.  This support person may not rotate.   Please refer to [email or web address] for the visitor guidelines for Inpatients (after your surgery is over and you are in a regular room).    Special instructions:    Oral Hygiene is also important to reduce your risk of infection.  Remember - BRUSH YOUR TEETH THE MORNING OF SURGERY WITH YOUR REGULAR TOOTHPASTE   [facility]- Preparing For Surgery  Before surgery, you can play an important role. Because skin is not sterile, your skin needs to be as free of germs as possible.  You can reduce the number of germs on your skin by washing with CHG (chlorahexidine gluconate) Soap before surgery.  CHG is an antiseptic cleaner which kills germs and bonds with the skin to continue killing germs even after washing.     Please do not use if you have an allergy to CHG or antibacterial soaps. If your skin becomes reddened/irritated stop using the CHG.  Do not shave (including legs and underarms) for at least 48 hours prior to first CHG shower. It is OK to shave your face.  Please follow these instructions carefully.     Shower the NIGHT BEFORE SURGERY and the MORNING OF SURGERY with CHG Soap.   If you chose to wash your hair, wash your hair first as usual with your normal shampoo. After you shampoo, rinse your hair and body thoroughly to remove the shampoo.  Then [employer] and  genitals (private parts) with your normal soap and rinse thoroughly to remove soap.  After that Use CHG Soap as you would any other liquid soap. You can apply CHG directly to the skin and wash gently with a scrungie or a clean washcloth.   Apply the CHG Soap to your body ONLY FROM THE NECK DOWN.  Do not use on open wounds or open sores. Avoid contact with your eyes, ears, mouth and genitals (private parts). Wash Face and genitals (private parts)  with your normal soap.   Wash thoroughly, paying special attention to the area where your surgery will be performed.  Thoroughly rinse your body with warm water from the neck down.  DO NOT shower/wash with your normal soap after using and rinsing off the CHG Soap.  Pat yourself dry with a CLEAN TOWEL.  Wear CLEAN PAJAMAS to bed the night before surgery  Place CLEAN SHEETS on your bed the night before your surgery  DO NOT SLEEP WITH PETS.   Day of Surgery:  Take a shower with CHG soap. Wear Clean/Comfortable clothing the morning of surgery Do not apply any deodorants/lotions.   Remember to brush your teeth WITH YOUR REGULAR TOOTHPASTE.    If you  received a COVID test during your pre-op visit, it is requested that you wear a mask when out in public, stay away from anyone that may not be feeling well, and notify your surgeon if you develop symptoms. If you have been in contact with anyone that has tested positive in the last 10 days, please notify your surgeon.    Please read over the following fact sheets that you were given.

## 2022-05-29 ENCOUNTER — Encounter (HOSPITAL_COMMUNITY)
Admission: RE | Admit: 2022-05-29 | Discharge: 2022-05-29 | Disposition: A | Discharge status: Home / Self Care | Payer: PPO | Source: Ambulatory Visit | Attending: Orthopaedic Surgery | Admitting: Orthopaedic Surgery

## 2022-05-29 ENCOUNTER — Encounter (HOSPITAL_COMMUNITY): Payer: Self-pay

## 2022-05-29 ENCOUNTER — Other Ambulatory Visit: Payer: Self-pay

## 2022-05-29 VITALS — BP 117/75 | HR 73 | Temp 97.9°F | Resp 18 | Ht 71.0 in | Wt 237.0 lb

## 2022-05-29 DIAGNOSIS — Z01812 Encounter for preprocedural laboratory examination: Secondary | ICD-10-CM | POA: Insufficient documentation

## 2022-05-29 DIAGNOSIS — E119 Type 2 diabetes mellitus without complications: Secondary | ICD-10-CM | POA: Insufficient documentation

## 2022-05-29 DIAGNOSIS — Z794 Long term (current) use of insulin: Secondary | ICD-10-CM

## 2022-05-29 DIAGNOSIS — Z01818 Encounter for other preprocedural examination: Secondary | ICD-10-CM

## 2022-05-29 LAB — HEMOGLOBIN A1C
Hgb A1c MFr Bld: 5.3 % (ref 4.8–5.6)
Mean Plasma Glucose: 105.41 mg/dL

## 2022-05-29 LAB — COMPREHENSIVE METABOLIC PANEL
ALT: 20 U/L (ref 0–44)
AST: 19 U/L (ref 15–41)
Albumin: 3.8 g/dL (ref 3.5–5.0)
Alkaline Phosphatase: 62 U/L (ref 38–126)
Anion gap: 8 (ref 5–15)
BUN: 22 mg/dL (ref 8–23)
CO2: 24 mmol/L (ref 22–32)
Calcium: 9.5 mg/dL (ref 8.9–10.3)
Chloride: 104 mmol/L (ref 98–111)
Creatinine, Ser: 1.18 mg/dL (ref 0.61–1.24)
GFR, Estimated: >60 mL/min (ref >60)
Glucose, Bld: 109 mg/dL — ABNORMAL HIGH (ref 70–99)
Potassium: 3.7 mmol/L (ref 3.5–5.1)
Sodium: 136 mmol/L (ref 135–145)
Total Bilirubin: 0.8 mg/dL (ref 0.3–1.2)
Total Protein: 6.9 g/dL (ref 6.5–8.1)

## 2022-05-29 LAB — SURGICAL PCR SCREEN
MRSA, PCR: POSITIVE — AB
Staphylococcus aureus: POSITIVE — AB

## 2022-05-29 LAB — CBC
HCT: 38.8 % — ABNORMAL LOW (ref 39.0–52.0)
Hemoglobin: 13.5 g/dL (ref 13.0–17.0)
MCH: 31.8 pg (ref 26.0–34.0)
MCHC: 34.8 g/dL (ref 30.0–36.0)
MCV: 91.3 fL (ref 80.0–100.0)
Platelets: 253 10*3/uL (ref 150–400)
RBC: 4.25 MIL/uL (ref 4.22–5.81)
RDW: 13.2 % (ref 11.5–15.5)
WBC: 10 10*3/uL (ref 4.0–10.5)
nRBC: 0 % (ref 0.0–0.2)

## 2022-05-29 LAB — TYPE AND SCREEN
ABO/RH(D): A NEG
Antibody Screen: NEGATIVE

## 2022-05-29 NOTE — Progress Notes (Addendum)
PCP - London Pepper, MD Cardiologist - Gwyndolyn Kaufman, MD  PPM/ICD - Denies  Chest x-ray - Denies EKG - 09/08/2021 Stress Test - Denies ECHO - 09/27/2017 Cardiac Cath - 09/28/2017  Sleep Study - Denies  DM: History of diabetes. Patient on Ozempic but does not check sugars. Collected A1c at PAT.  Last dose of GLP1 agonist-  05/25/22 GLP1 instructions: Hold Ozempic for 7 days prior to surgery  Blood Thinner Instructions: N/A Aspirin Instructions:N/A  ERAS Protcol - Yes PRE-SURGERY Ensure or G2- G2  COVID TEST- N/A   Anesthesia review: Yes, Cardiac history  Patient denies shortness of breath, fever, cough and chest pain at PAT appointment   All instructions explained to the patient, with a verbal understanding of the material. Patient agrees to go over the instructions while at home for a better understanding The opportunity to ask questions was provided.

## 2022-06-01 ENCOUNTER — Telehealth: Payer: Self-pay | Admitting: *Deleted

## 2022-06-01 ENCOUNTER — Encounter: Payer: Self-pay | Admitting: Nurse Practitioner

## 2022-06-01 ENCOUNTER — Ambulatory Visit: Payer: PPO | Attending: Nurse Practitioner | Admitting: Nurse Practitioner

## 2022-06-01 DIAGNOSIS — Z01810 Encounter for preprocedural cardiovascular examination: Secondary | ICD-10-CM | POA: Diagnosis not present

## 2022-06-01 MED ORDER — VANCOMYCIN HCL 1500 MG/300ML IV SOLN
1500.0000 mg | INTRAVENOUS | Status: AC
  Administered 2022-06-02: 1500 mg via INTRAVENOUS
  Filled 2022-06-01 (×2): qty 300

## 2022-06-01 NOTE — Telephone Encounter (Signed)
   Pre-operative Risk Assessment    Patient Name: Alex Jackson  DOB: 1945-07-15 MRN: [id number]      Request for Surgical Clearance    Procedure:   RIGHT TOTAL HIP ARTHROPLASTY  Date of Surgery:  Clearance 06/02/22 URGENT                                 Surgeon:  DR. Jean Rosenthal Surgeon's Group or Practice Name:  [facility] Phone number:  [phone] Fax number:  [phone] ATTN: SHERRIE   Type of Clearance Requested:   - Medical ; ASA    Type of Anesthesia:  Spinal   Additional requests/questions:    Jiles Prows   06/01/2022, 10:26 AM

## 2022-06-01 NOTE — Telephone Encounter (Signed)
Urgent add on today per surgery date tomorrow and our office was just notified today. Tele consent given.      Patient Consent for Virtual Visit        Alex Jackson has provided verbal consent on 06/01/2022 for a virtual visit (video or telephone).   CONSENT FOR VIRTUAL VISIT FOR:  Alex Jackson  By participating in this virtual visit I agree to the following:  I hereby voluntarily request, consent and authorize [facility] and its employed or contracted physicians, physician assistants, nurse practitioners or other licensed health care professionals (the Practitioner), to provide me with telemedicine health care services (the "Services") as deemed necessary by the treating Practitioner. I acknowledge and consent to receive the Services by the Practitioner via telemedicine. I understand that the telemedicine visit will involve communicating with the Practitioner through live audiovisual communication technology and the disclosure of certain medical information by electronic transmission. I acknowledge that I have been given the opportunity to request an in-person assessment or other available alternative prior to the telemedicine visit and am voluntarily participating in the telemedicine visit.  I understand that I have the right to withhold or withdraw my consent to the use of telemedicine in the course of my care at any time, without affecting my right to future care or treatment, and that the Practitioner or I may terminate the telemedicine visit at any time. I understand that I have the right to inspect all information obtained and/or recorded in the course of the telemedicine visit and may receive copies of available information for a reasonable fee.  I understand that some of the potential risks of receiving the Services via telemedicine include:  Delay or interruption in medical evaluation due to technological equipment failure or disruption; Information transmitted may not  be sufficient (e.g. poor resolution of images) to allow for appropriate medical decision making by the Practitioner; and/or  In rare instances, security protocols could fail, causing a breach of personal health information.  Furthermore, I acknowledge that it is my responsibility to provide information about my medical history, conditions and care that is complete and accurate to the best of my ability. I acknowledge that Practitioner's advice, recommendations, and/or decision may be based on factors not within their control, such as incomplete or inaccurate data provided by me or distortions of diagnostic images or specimens that may result from electronic transmissions. I understand that the practice of medicine is not an exact science and that Practitioner makes no warranties or guarantees regarding treatment outcomes. I acknowledge that a copy of this consent can be made available to me via my patient portal ([facility]), or I can request a printed copy by calling the office of [facility].    I understand that my insurance will be billed for this visit.   I have read or had this consent read to me. I understand the contents of this consent, which adequately explains the benefits and risks of the Services being provided via telemedicine.  I have been provided ample opportunity to ask questions regarding this consent and the Services and have had my questions answered to my satisfaction. I give my informed consent for the services to be provided through the use of telemedicine in my medical care

## 2022-06-01 NOTE — H&P (Signed)
TOTAL HIP ADMISSION H&P  Patient is admitted for right total hip arthroplasty.  Subjective:  Chief Complaint: right hip pain  HPI: Alex Jackson, 77 y.o. male, has a history of pain and functional disability in the right hip(s) due to arthritis and patient has failed non-surgical conservative treatments for greater than 12 weeks to include flexibility and strengthening excercises, use of assistive devices, weight reduction as appropriate, and activity modification.  Onset of symptoms was gradual starting 2 years ago with gradually worsening course since that time.The patient noted no past surgery on the right hip(s).  Patient currently rates pain in the right hip at 10 out of 10 with activity. Patient has night pain, worsening of pain with activity and weight bearing, trendelenberg gait, pain that interfers with activities of daily living, and pain with passive range of motion. Patient has evidence of subchondral cysts, subchondral sclerosis, periarticular osteophytes, and joint space narrowing by imaging studies. This condition presents safety issues increasing the risk of falls.  There is no current active infection.  Patient Active Problem List   Diagnosis Date Noted   Unilateral primary osteoarthritis, right hip 05/12/2022   Inflammation of sacroiliac joint ([place]) 02/27/2022   Lumbar radiculopathy 02/27/2022   Plantar ulcer of left foot ([place]) 02/04/2022   Diabetic infection of left foot ([place]) 10/22/2021   Osteomyelitis of left foot ([place]) 10/22/2021   Ulcer of left foot due to type 2 diabetes mellitus ([place]) 10/04/2020   Chronic sinusitis 05/17/2019   Piriformis syndrome of left side 07/23/2017   Coronary artery disease involving native coronary artery of native heart with unstable angina pectoris ([place]) 02/11/2017   Lumbar spondylosis 02/11/2017   Iliotibial band syndrome of both sides 02/11/2017   H/O hyperglycemia 02/11/2017   Chronic lower back pain 02/11/2017   Cardiomyopathy,  ischemic 09/16/2015   SOB (shortness of breath) 09/16/2015   LV dysfunction 09/16/2015   Hyperlipidemia 03/02/2015   Essential hypertension 03/02/2015   S/P drug eluting coronary stent placement 03/02/2015   NSTEMI (non-ST elevated myocardial infarction) ([place]) 03/01/2015   Past Medical History:  Diagnosis Date   CAD in native artery 03/01/2015   1. cath - DES to LAD residual non obstructive RCA disease in 2016 b. Cath s/p Successful PCI with stenting of the distal RCA into the PLOM with a DES. Balloon angioplasty of the PDA through the stent struts.    Diabetes mellitus without complication (HCC)    Edema of both lower extremities    Hyperlipidemia    Hypertension    Leg weakness    Overweight    Spinal stenosis    Ulcer of great toe [facility])     Past Surgical History:  Procedure Laterality Date   CARDIAC CATHETERIZATION N/A 03/01/2015   Procedure: Left Heart Cath and Coronary Angiography;  Surgeon: Burnell Blanks, MD;  Location: [facility];  Service: Cardiovascular;  Laterality: N/A;   CORONARY STENT INTERVENTION N/A 09/28/2017   Procedure: CORONARY STENT INTERVENTION;  Surgeon: Martinique, Peter M, MD;  Location: [place] [facility];  Service: Cardiovascular;  Laterality: N/A;   LEFT HEART CATH AND CORONARY ANGIOGRAPHY N/A 09/28/2017   Procedure: LEFT HEART CATH AND CORONARY ANGIOGRAPHY;  Surgeon: Martinique, Peter M, MD;  Location: [facility];  Service: Cardiovascular;  Laterality: N/A;   NO PAST SURGERIES      Current Facility-Administered Medications  Medication Dose Route Frequency Provider Last Rate Last Admin   [START ON 06/02/2022] vancomycin (VANCOREADY) IVPB 1500 mg/300 mL  1,500 mg Intravenous 60 min Pre-Op Mcarthur Rossetti, MD       Current Outpatient Medications  Medication Sig Dispense Refill Last Dose   aspirin EC 81 MG tablet Take 81 mg by mouth at bedtime.      bisoprolol (ZEBETA) 10 MG tablet TAKE ONE TABLET BY MOUTH EVERY MORNING 90 tablet 3     chlorthalidone (HYGROTON) 25 MG tablet TAKE ONE TABLET BY MOUTH ONCE DAILY 90 tablet 3    fluticasone (FLONASE) 50 MCG/ACT nasal spray Place 1 spray into both nostrils daily as needed for allergies.  4    furosemide (LASIX) 40 MG tablet Take 1 tablet (40 mg total) by mouth daily as needed for fluid or edema. 30 tablet 3    GEMTESA 75 MG TABS Take 75 mg by mouth in the morning.      lisinopril (ZESTRIL) 40 MG tablet TAKE ONE TABLET BY MOUTH ONCE DAILY 90 tablet 3    Multiple Vitamin (MULTIVITAMIN WITH MINERALS) TABS tablet Take 1 tablet by mouth in the morning. Men's 50+ Complete Multivitamin      naproxen sodium (ALEVE) 220 MG tablet Take 440 mg by mouth daily as needed (pain.).      pantoprazole (PROTONIX) 40 MG tablet TAKE ONE TABLET BY MOUTH ONCE DAILY 90 tablet 3    rosuvastatin (CRESTOR) 10 MG tablet Take 1 tablet (10 mg total) by mouth daily. 90 tablet 3    Semaglutide, 1 MG/DOSE, (OZEMPIC, 1 MG/DOSE,) 4 MG/3ML SOPN Inject 1 mg into the skin once a week. (Patient taking differently: Inject 1 mg into the skin every Monday.) 3 mL 2    tamsulosin (FLOMAX) 0.4 MG CAPS capsule Take 0.4 mg by mouth in the morning.      Allergies  Allergen Reactions   Prednisone Anxiety and Other (See Comments)    Makes him crazy   Atorvastatin Other (See Comments)    Pt reports "causes lower extremity muscle aches."   Pravastatin Other (See Comments)    Pt reports "causes bilateral calf cramping."    Social History   Tobacco Use   Smoking status: Former    Packs/day: 1.00    Types: Cigarettes    Quit date: 07/03/1983    Years since quitting: 38.9   Smokeless tobacco: Never   Tobacco comments:    quit in Mar 1985  Substance Use Topics   Alcohol use: Yes    Alcohol/week: 0.0 standard drinks of alcohol    Comment: "one or two drinks a night"    Family History  Problem Relation Age of Onset   Thyroid cancer Mother    Liver cancer Father        pancreatic   Arrhythmia Sister      Review of  Systems  Objective:  Physical Exam Vitals reviewed.  Constitutional:      Appearance: Normal appearance.  HENT:     Head: Normocephalic and atraumatic.  Eyes:     Extraocular Movements: Extraocular movements intact.     Pupils: Pupils are equal, round, and reactive to light.  Cardiovascular:     Rate and Rhythm: Normal rate.  Pulmonary:     Effort: Pulmonary effort is normal.     Breath sounds: Normal breath sounds.  Abdominal:     Palpations: Abdomen is soft.  Musculoskeletal:     Cervical back: Normal range of motion and neck supple.     Right hip: Tenderness and bony tenderness present. Decreased range of motion. Decreased strength.  Neurological:  Mental Status: He is alert and oriented to person, place, and time.  Psychiatric:        Behavior: Behavior normal.     Vital signs in last 24 hours:    Labs:   Estimated body mass index is 33.05 kg/m as calculated from the following:   Height as of 05/29/22: 5\' 11"  (1.803 m).   Weight as of 05/29/22: 107.5 kg.   Imaging Review Plain radiographs demonstrate severe degenerative joint disease of the right hip(s). The bone quality appears to be good for age and reported activity level.      Assessment/Plan:  End stage arthritis, right hip(s)  The patient history, physical examination, clinical judgement of the provider and imaging studies are consistent with end stage degenerative joint disease of the right hip(s) and total hip arthroplasty is deemed medically necessary. The treatment options including medical management, injection therapy, arthroscopy and arthroplasty were discussed at length. The risks and benefits of total hip arthroplasty were presented and reviewed. The risks due to aseptic loosening, infection, stiffness, dislocation/subluxation,  thromboembolic complications and other imponderables were discussed.  The patient acknowledged the explanation, agreed to proceed with the plan and consent was signed.  Patient is being admitted for inpatient treatment for surgery, pain control, PT, OT, prophylactic antibiotics, VTE prophylaxis, progressive ambulation and ADL's and discharge planning.The patient is planning to be discharged home with home health services

## 2022-06-01 NOTE — Anesthesia Preprocedure Evaluation (Signed)
Anesthesia Evaluation  Patient identified by MRN, date of birth, ID band Patient awake    Reviewed: Allergy & Precautions, NPO status , Patient's Chart, lab work & pertinent test results, reviewed documented beta blocker date and time   Airway Mallampati: III       Dental no notable dental hx. (+) Teeth Intact, Caps, Dental Advisory Given   Pulmonary former smoker   Pulmonary exam normal breath sounds clear to auscultation       Cardiovascular hypertension, Pt. on medications and Pt. on home beta blockers + angina with exertion + CAD, + Past MI and + Cardiac Stents  Normal cardiovascular exam Rhythm:Regular Rate:Normal  NSTEMI in 2016 with PCI to LAD and recurrent NSTEMI 2019 s/p PCI to RCA and PDA\  Echo 09/27/17 Left ventricle: The cavity size was normal. Wall thickness was    increased in a pattern of mild LVH. Systolic function was normal.    The estimated ejection fraction was in the range of 55% to 60%.    Wall motion was normal; there were no regional wall motion    abnormalities. Doppler parameters are consistent with abnormal    left ventricular relaxation (grade 1 diastolic dysfunction).  - Mitral valve: Valve area by pressure half-time: 1.67 cm^2.   Impressions:   - Normal LV systolic function; mild diastolic dysfunction; mild    LVH; trace MR and TR.   EKG 09/18/21 NSR, 1st Deg AVB    Neuro/Psych  Neuromuscular disease  negative psych ROS   GI/Hepatic negative GI ROS, Neg liver ROS,,,  Endo/Other  diabetes, Well Controlled, Type 2  Obesity HLD  Renal/GU negative Renal ROS   BPH    Musculoskeletal  (+) Arthritis , Osteoarthritis,  OA right hip Spinal stenosis   Abdominal  (+) + obese  Peds  Hematology negative hematology ROS (+)   Anesthesia Other Findings   Reproductive/Obstetrics                             Anesthesia Physical Anesthesia Plan  ASA: 3  Anesthesia  Plan: Spinal   Post-op Pain Management: Minimal or no pain anticipated   Induction: Intravenous  PONV Risk Score and Plan: 2 and Treatment may vary due to age or medical condition and Propofol infusion  Airway Management Planned: Natural Airway, Nasal Cannula and Simple Face Mask  Additional Equipment: None  Intra-op Plan:   Post-operative Plan:   Informed Consent: I have reviewed the patients History and Physical, chart, labs and discussed the procedure including the risks, benefits and alternatives for the proposed anesthesia with the patient or authorized representative who has indicated his/her understanding and acceptance.     Dental advisory given  Plan Discussed with: CRNA and Anesthesiologist  Anesthesia Plan Comments: (PAT note by Karoline Caldwell, PA-C:  Follows with cardiology for history of CAD s/p NSTEMI in 2016 with PCI to LAD and recurrent NSTEMI 2019 s/p PCI to RCA and PDA, TTE 09/2017 with LVEF 55-60%, G1DD, normal RV, no significant valve disease, chronic HFpEF.  Patient was evaluated by Christen Bame, NP via televisit on 06/01/2022 for preop evaluation.  Per note, "Preoperative Cardiovascular Risk Assessment: The patient is doing well from a cardiac perspective. Therefore, based on ACC/AHA guidelines, the patient would be at acceptable risk for the planned procedure without further cardiovascular testing.According to the Revised Cardiac Risk Index (RCRI),hisPerioperative Risk of Major Cardiac Event is (%): 11. HisFunctional Capacity in METs is: 6.27according to the  [facility] Activity Status Index (DASI). The patient was advised that ifhedevelops new symptoms prior to surgery to contact our office to arrange for a follow-up visit, and heverbalized understanding. Patient stopped his aspirin prior to virtual visit. He has been advised that he will be resuming aspirin 81 mg twice daily post hip arthroplasty."  Non-insulin-dependent DM2, on GLP-1 agonist Ozempic, last dose  reported 05/25/2022. Diabetes is well-controlled, A1c 5.3 on preop labs.  Preop labs reviewed, unremarkable.    EKG 09/08/2021: Sinus rhythm with first-degree AV block.  Rate 73.  Cath and PCI 09/28/2017: ? Previously placed Prox LAD drug eluting stent is widely patent. ? Mid RCA to Dist RCA lesion is 30% stenosed. ? RPDA lesion is 20% stenosed. ? Dist RCA lesion is 90% stenosed with 99% stenosed side branch in Post Atrio. ? A drug-eluting stent was successfully placed using a STENT SYNERGY DES 3.5X16. ? Post intervention, there is a 0% residual stenosis. ? Post intervention, the side branch was reduced to 0% residual stenosis. ? Ost RPDA lesion is 60% stenosed. ? Balloon angioplasty was performed. ? Post intervention, there is a 0% residual stenosis. ? LV end diastolic pressure is normal.  1. Single vessel obstructive CAD  2. Patent stent in the LAD 3. Normal LVEDP 4. Successful PCI with stenting of the distal RCA into the PLOM with a DES. Balloon angioplasty of the PDA through the stent struts.   Plan: DAPT for one year. Risk factor modification. Anticipate DC in am.  TTE 09/27/2017: - Left ventricle: The cavity size was normal. Wall thickness was  increased in a pattern of mild LVH. Systolic function was normal.  The estimated ejection fraction was in the range of 55% to 60%.  Wall motion was normal; there were no regional wall motion  abnormalities. Doppler parameters are consistent with abnormal  left ventricular relaxation (grade 1 diastolic dysfunction).  - Mitral valve: Valve area by pressure half-time: 1.67 cm^2.   Impressions:   - Normal LV systolic function; mild diastolic dysfunction; mild  LVH; trace MR and TR.    )        Anesthesia Quick Evaluation

## 2022-06-01 NOTE — Care Plan (Signed)
[facility] RNCM call to patient to discuss his upcoming Right total hip arthroplasty with Dr. Ninfa Linden on 06/02/22. He is an Ortho bundle patient through [facility] and is agreeable to case management. He has a son that will be assisting at home after discharge. He is very adamant that he not have home health PT and go straight to an OPPT facility. He attends PT at [facility] ([facility]) regularly and has an appointment scheduled already for Friday, 06/05/22. I will discuss with [facility] and MD and get them an order for therapy services. He has a RW and a cane. Reviewed all post op care instructions. Will continue to follow for needs.

## 2022-06-01 NOTE — Progress Notes (Signed)
Virtual Visit via Telephone Note   Because of Alex Jackson's co-morbid illnesses, he is at least at moderate risk for complications without adequate follow up.  This format is felt to be most appropriate for this patient at this time.  The patient did not have access to video technology/had technical difficulties with video requiring transitioning to audio format only (telephone).  All issues noted in this document were discussed and addressed.  No physical exam could be performed with this format.  Please refer to the patient's chart for his consent to telehealth for [facility].  Evaluation Performed:  Preoperative cardiovascular risk assessment _____________   Date:  06/01/2022   Patient ID:  Alex Jackson, DOB 14-Apr-1946, MRN [id number] Patient Location:  Home Provider location:   Office  Primary Care Provider:  London Pepper, MD Primary Cardiologist:  Ena Dawley, MD  Chief Complaint / Patient Profile   77 y.o. y/o male with a h/o CAD s/p NSTEMI in 2016 with PCI to LAD and recurrent NSTEMI 2019 s/p PCI to RCA and PDA, TTE 09/2017 with LVEF 55-60%, G1DD, normal RV, no significant valve disease, chronic HFpEF,  who is pending right total hip arthroplasty and presents today for telephonic preoperative cardiovascular risk assessment.  History of Present Illness    Alex Jackson is a 77 y.o. male who presents via audio/video conferencing for a telehealth visit today.  Pt was last seen in cardiology clinic on 03/11/22 by Dr. Johney Jackson.  At that time Alex Jackson was doing well .  The patient is now pending procedure as outlined above. Since his last visit, he  denies chest pain, shortness of breath, lower extremity edema, fatigue, palpitations, melena, hematuria, hemoptysis, diaphoresis, weakness, presyncope, syncope, orthopnea, and PND. Activity has been somewhat limited by severe hip pain, but he can still achieve > 4 METS activity without concerning cardiac  symptoms.   Past Medical History    Past Medical History:  Diagnosis Date   CAD in native artery 03/01/2015   1. cath - DES to LAD residual non obstructive RCA disease in 2016 b. Cath s/p Successful PCI with stenting of the distal RCA into the PLOM with a DES. Balloon angioplasty of the PDA through the stent struts.    Diabetes mellitus without complication (HCC)    Edema of both lower extremities    Hyperlipidemia    Hypertension    Leg weakness    Overweight    Spinal stenosis    Ulcer of great toe [facility])    Past Surgical History:  Procedure Laterality Date   CARDIAC CATHETERIZATION N/A 03/01/2015   Procedure: Left Heart Cath and Coronary Angiography;  Surgeon: Burnell Blanks, MD;  Location: [place] [facility];  Service: Cardiovascular;  Laterality: N/A;   CORONARY STENT INTERVENTION N/A 09/28/2017   Procedure: CORONARY STENT INTERVENTION;  Surgeon: Martinique, Peter M, MD;  Location: [place] [facility];  Service: Cardiovascular;  Laterality: N/A;   LEFT HEART CATH AND CORONARY ANGIOGRAPHY N/A 09/28/2017   Procedure: LEFT HEART CATH AND CORONARY ANGIOGRAPHY;  Surgeon: Martinique, Peter M, MD;  Location: [place] [facility];  Service: Cardiovascular;  Laterality: N/A;   NO PAST SURGERIES      Allergies  Allergies  Allergen Reactions   Prednisone Anxiety and Other (See Comments)    Makes him crazy   Atorvastatin Other (See Comments)    Pt reports "causes lower extremity muscle aches."   Pravastatin Other (See Comments)    Pt  reports "causes bilateral calf cramping."    Home Medications    Prior to Admission medications   Medication Sig Start Date End Date Taking? Authorizing Provider  aspirin EC 81 MG tablet Take 81 mg by mouth at bedtime.    [provider]  bisoprolol (ZEBETA) 10 MG tablet TAKE ONE TABLET BY MOUTH EVERY MORNING 10/14/21   Meriam Sprague, MD  chlorthalidone (HYGROTON) 25 MG tablet TAKE ONE TABLET BY MOUTH ONCE DAILY 10/20/21   Iran Ouch, MD  fluticasone (FLONASE) 50 MCG/ACT nasal spray Place 1 spray into both nostrils daily as needed for allergies. 01/11/15   [provider]  furosemide (LASIX) 40 MG tablet Take 1 tablet (40 mg total) by mouth daily as needed for fluid or edema. 03/11/22   Meriam Sprague, MD  GEMTESA 75 MG TABS Take 75 mg by mouth in the morning.    [provider]  lisinopril (ZESTRIL) 40 MG tablet TAKE ONE TABLET BY MOUTH ONCE DAILY 10/20/21   Iran Ouch, MD  Misc Natural Products (OSTEO BI-FLEX ADV JOINT SHIELD PO) Take 2 capsules by mouth in the morning. With Turmeric    [provider]  Multiple Vitamin (MULTIVITAMIN WITH MINERALS) TABS tablet Take 1 tablet by mouth in the morning. Men's 50+ Complete Multivitamin    [provider]  naproxen sodium (ALEVE) 220 MG tablet Take 440 mg by mouth daily as needed (pain.).    [provider]  nitroGLYCERIN (NITROSTAT) 0.4 MG SL tablet Place 1 tablet (0.4 mg total) under the tongue every 5 (five) minutes x 3 doses as needed for chest pain. 09/29/17   Leone Brand, NP  pantoprazole (PROTONIX) 40 MG tablet TAKE ONE TABLET BY MOUTH ONCE DAILY 10/20/21   Iran Ouch, MD  rosuvastatin (CRESTOR) 10 MG tablet Take 1 tablet (10 mg total) by mouth daily. 09/23/21   Meriam Sprague, MD  RSV vaccine recomb adjuvanted (AREXVY) 120 MCG/0.5ML injection Inject into the muscle. Patient not taking: Reported on 05/26/2022 04/01/22   Judyann Munson, MD  Semaglutide, 1 MG/DOSE, (OZEMPIC, 1 MG/DOSE,) 4 MG/3ML SOPN Inject 1 mg into the skin once a week. Patient taking differently: Inject 1 mg into the skin every Monday. 02/04/22   Meriam Sprague, MD  tamsulosin (FLOMAX) 0.4 MG CAPS capsule Take 0.4 mg by mouth in the morning. 02/02/22   [provider]    Physical Exam    Vital Signs:  Alex Jackson does not have vital signs available for review today.  Given telephonic nature of communication, physical  exam is limited. AAOx3. NAD. Normal affect.  Speech and respirations are unlabored.  Accessory Clinical Findings    None  Assessment & Plan    1.  Preoperative Cardiovascular Risk Assessment: The patient is doing well from a cardiac perspective. Therefore, based on ACC/AHA guidelines, the patient would be at acceptable risk for the planned procedure without further cardiovascular testing. According to the Revised Cardiac Risk Index (RCRI), his Perioperative Risk of Major Cardiac Event is (%): 11 His Functional Capacity in METs is: 6.27 according to the [facility] Activity Status Index (DASI).  The patient was advised that if he develops new symptoms prior to surgery to contact our office to arrange for a follow-up visit, and he verbalized understanding.  Patient stopped his aspirin prior to virtual visit. He has been advised that he will be resuming aspirin 81 mg twice daily post hip arthroplasty.   A copy of this  note will be routed to requesting surgeon.  Time:   Today, I have spent 10 minutes with the patient with telehealth technology discussing medical history, symptoms, and management plan.    Emmaline Life, NP-C  06/01/2022, 10:55 AM [address] Office [phone] Fax [phone]

## 2022-06-01 NOTE — Telephone Encounter (Signed)
Ortho bundle pre-op call. Patient really wanted to relay that he doesn't wish to go home with a narcotic pain medication. I told him you would have to see how he does after surgery and this would be determined. Also, he didn't want HHPT, but wanted to go to OPPT at [facility] where they have a therapist there. I can send order for that. Just an FYI.

## 2022-06-01 NOTE — Progress Notes (Signed)
Anesthesia Chart Review:  Follows with cardiology for history of CAD s/p NSTEMI in 2016 with PCI to LAD and recurrent NSTEMI 2019 s/p PCI to RCA and PDA, TTE 09/2017 with LVEF 55-60%, G1DD, normal RV, no significant valve disease, chronic HFpEF.  Patient was evaluated by Christen Bame, NP via televisit on 06/01/2022 for preop evaluation.  Per note, " Preoperative Cardiovascular Risk Assessment: The patient is doing well from a cardiac perspective. Therefore, based on ACC/AHA guidelines, the patient would be at acceptable risk for the planned procedure without further cardiovascular testing. According to the Revised Cardiac Risk Index (RCRI), his Perioperative Risk of Major Cardiac Event is (%): 11. His Functional Capacity in METs is: 6.27 according to the [facility] Activity Status Index (DASI). The patient was advised that if he develops new symptoms prior to surgery to contact our office to arrange for a follow-up visit, and he verbalized understanding. Patient stopped his aspirin prior to virtual visit. He has been advised that he will be resuming aspirin 81 mg twice daily post hip arthroplasty."  Non-insulin-dependent DM2, on GLP-1 agonist Ozempic, last dose reported 05/25/2022. Diabetes is well-controlled, A1c 5.3 on preop labs.  Preop labs reviewed, unremarkable.    EKG 09/08/2021: Sinus rhythm with first-degree AV block.  Rate 73.  Cath and PCI 09/28/2017: Previously placed Prox LAD drug eluting stent is widely patent. Mid RCA to Dist RCA lesion is 30% stenosed. RPDA lesion is 20% stenosed. Dist RCA lesion is 90% stenosed with 99% stenosed side branch in Post Atrio. A drug-eluting stent was successfully placed using a STENT SYNERGY DES 3.5X16. Post intervention, there is a 0% residual stenosis. Post intervention, the side branch was reduced to 0% residual stenosis. Ost RPDA lesion is 60% stenosed. Balloon angioplasty was performed. Post intervention, there is a 0% residual stenosis. LV end  diastolic pressure is normal.   1. Single vessel obstructive CAD  2. Patent stent in the LAD 3. Normal LVEDP 4. Successful PCI with stenting of the distal RCA into the PLOM with a DES. Balloon angioplasty of the PDA through the stent struts.    Plan: DAPT for one year. Risk factor modification. Anticipate DC in am.  TTE 09/27/2017: - Left ventricle: The cavity size was normal. Wall thickness was    increased in a pattern of mild LVH. Systolic function was normal.    The estimated ejection fraction was in the range of 55% to 60%.    Wall motion was normal; there were no regional wall motion    abnormalities. Doppler parameters are consistent with abnormal    left ventricular relaxation (grade 1 diastolic dysfunction).  - Mitral valve: Valve area by pressure half-time: 1.67 cm^2.   Impressions:   - Normal LV systolic function; mild diastolic dysfunction; mild    LVH; trace MR and TR.     Wynonia Musty [facility] : [facility] Short Stay Center/Anesthesiology Phone [phone] 06/01/2022 12:43 PM

## 2022-06-02 ENCOUNTER — Other Ambulatory Visit: Payer: Self-pay

## 2022-06-02 ENCOUNTER — Observation Stay (HOSPITAL_COMMUNITY): Payer: PPO

## 2022-06-02 ENCOUNTER — Ambulatory Visit (HOSPITAL_BASED_OUTPATIENT_CLINIC_OR_DEPARTMENT_OTHER): Payer: PPO | Admitting: Physician Assistant

## 2022-06-02 ENCOUNTER — Observation Stay (HOSPITAL_COMMUNITY)
Admission: RE | Admit: 2022-06-02 | Discharge: 2022-06-03 | Disposition: A | Discharge status: Home Health | Payer: PPO | Attending: Orthopaedic Surgery | Admitting: Orthopaedic Surgery

## 2022-06-02 ENCOUNTER — Encounter (HOSPITAL_COMMUNITY)
Admission: RE | Disposition: A | Discharge status: Home Health | Payer: Self-pay | Source: Home / Self Care | Attending: Orthopaedic Surgery

## 2022-06-02 ENCOUNTER — Encounter (HOSPITAL_COMMUNITY): Payer: Self-pay | Admitting: Orthopaedic Surgery

## 2022-06-02 ENCOUNTER — Other Ambulatory Visit (HOSPITAL_COMMUNITY): Payer: Self-pay

## 2022-06-02 ENCOUNTER — Ambulatory Visit (HOSPITAL_COMMUNITY): Payer: PPO | Admitting: Physician Assistant

## 2022-06-02 DIAGNOSIS — I10 Essential (primary) hypertension: Secondary | ICD-10-CM | POA: Insufficient documentation

## 2022-06-02 DIAGNOSIS — Z87891 Personal history of nicotine dependence: Secondary | ICD-10-CM | POA: Diagnosis not present

## 2022-06-02 DIAGNOSIS — I25119 Atherosclerotic heart disease of native coronary artery with unspecified angina pectoris: Secondary | ICD-10-CM

## 2022-06-02 DIAGNOSIS — E119 Type 2 diabetes mellitus without complications: Secondary | ICD-10-CM | POA: Diagnosis not present

## 2022-06-02 DIAGNOSIS — M1611 Unilateral primary osteoarthritis, right hip: Secondary | ICD-10-CM

## 2022-06-02 DIAGNOSIS — Z01818 Encounter for other preprocedural examination: Secondary | ICD-10-CM

## 2022-06-02 DIAGNOSIS — Z96641 Presence of right artificial hip joint: Secondary | ICD-10-CM | POA: Diagnosis not present

## 2022-06-02 DIAGNOSIS — Z955 Presence of coronary angioplasty implant and graft: Secondary | ICD-10-CM | POA: Diagnosis not present

## 2022-06-02 DIAGNOSIS — I252 Old myocardial infarction: Secondary | ICD-10-CM

## 2022-06-02 DIAGNOSIS — I2510 Atherosclerotic heart disease of native coronary artery without angina pectoris: Secondary | ICD-10-CM | POA: Diagnosis not present

## 2022-06-02 DIAGNOSIS — Z471 Aftercare following joint replacement surgery: Secondary | ICD-10-CM | POA: Diagnosis not present

## 2022-06-02 DIAGNOSIS — Z7982 Long term (current) use of aspirin: Secondary | ICD-10-CM | POA: Diagnosis not present

## 2022-06-02 DIAGNOSIS — M169 Osteoarthritis of hip, unspecified: Secondary | ICD-10-CM | POA: Diagnosis present

## 2022-06-02 HISTORY — PX: TOTAL HIP ARTHROPLASTY: SHX124

## 2022-06-02 LAB — GLUCOSE, CAPILLARY: Glucose-Capillary: 89 mg/dL (ref 70–99)

## 2022-06-02 LAB — ABO/RH: ABO/RH(D): A NEG

## 2022-06-02 SURGERY — ARTHROPLASTY, HIP, TOTAL, ANTERIOR APPROACH
Anesthesia: Spinal | Site: Hip | Laterality: Right

## 2022-06-02 MED ORDER — FENTANYL CITRATE (PF) 250 MCG/5ML IJ SOLN
INTRAMUSCULAR | Status: DC | PRN
  Administered 2022-06-02: 50 ug via INTRAVENOUS

## 2022-06-02 MED ORDER — EPHEDRINE SULFATE-NACL 50-0.9 MG/10ML-% IV SOSY
PREFILLED_SYRINGE | INTRAVENOUS | Status: DC | PRN
  Administered 2022-06-02: 2.5 mg via INTRAVENOUS

## 2022-06-02 MED ORDER — TRANEXAMIC ACID-NACL 1000-0.7 MG/100ML-% IV SOLN
1000.0000 mg | INTRAVENOUS | Status: AC
  Administered 2022-06-02: 1000 mg via INTRAVENOUS
  Filled 2022-06-02: qty 100

## 2022-06-02 MED ORDER — CEFAZOLIN SODIUM-DEXTROSE 2-4 GM/100ML-% IV SOLN
2.0000 g | INTRAVENOUS | Status: AC
  Administered 2022-06-02: 2 g via INTRAVENOUS
  Filled 2022-06-02: qty 100

## 2022-06-02 MED ORDER — POLYETHYLENE GLYCOL 3350 17 G PO PACK: 17.0000 g | PACK | Freq: Every day | ORAL | Status: DC | PRN

## 2022-06-02 MED ORDER — ONDANSETRON HCL 4 MG/2ML IJ SOLN: 4.0000 mg | Freq: Once | INTRAMUSCULAR | Status: DC | PRN

## 2022-06-02 MED ORDER — DEXAMETHASONE SODIUM PHOSPHATE 10 MG/ML IJ SOLN: INTRAMUSCULAR | Status: DC | PRN

## 2022-06-02 MED ORDER — SODIUM CHLORIDE 0.9 % IR SOLN
Status: DC | PRN
  Administered 2022-06-02: 1000 mL

## 2022-06-02 MED ORDER — SODIUM CHLORIDE 0.9 % IV SOLN: INTRAVENOUS | Status: DC

## 2022-06-02 MED ORDER — METHOCARBAMOL 500 MG PO TABS
500.0000 mg | ORAL_TABLET | Freq: Four times a day (QID) | ORAL | Status: DC | PRN
  Administered 2022-06-02: 500 mg via ORAL
  Filled 2022-06-02: qty 1

## 2022-06-02 MED ORDER — PHENOL 1.4 % MT LIQD: 1.0000 | OROMUCOSAL | Status: DC | PRN

## 2022-06-02 MED ORDER — VANCOMYCIN HCL IN DEXTROSE 1-5 GM/200ML-% IV SOLN
1000.0000 mg | Freq: Two times a day (BID) | INTRAVENOUS | Status: AC
  Administered 2022-06-02: 1000 mg via INTRAVENOUS
  Filled 2022-06-02: qty 200

## 2022-06-02 MED ORDER — MENTHOL 3 MG MT LOZG: 1.0000 | LOZENGE | OROMUCOSAL | Status: DC | PRN

## 2022-06-02 MED ORDER — METOCLOPRAMIDE HCL 5 MG PO TABS: 5.0000 mg | ORAL_TABLET | Freq: Three times a day (TID) | ORAL | Status: DC | PRN

## 2022-06-02 MED ORDER — ONDANSETRON HCL 4 MG/2ML IJ SOLN
INTRAMUSCULAR | Status: DC | PRN
  Administered 2022-06-02: 4 mg via INTRAVENOUS

## 2022-06-02 MED ORDER — MIDAZOLAM HCL 2 MG/2ML IJ SOLN
INTRAMUSCULAR | Status: AC
  Filled 2022-06-02: qty 2

## 2022-06-02 MED ORDER — TAMSULOSIN HCL 0.4 MG PO CAPS
0.4000 mg | ORAL_CAPSULE | Freq: Every morning | ORAL | Status: DC
  Administered 2022-06-03: 0.4 mg via ORAL
  Filled 2022-06-02: qty 1

## 2022-06-02 MED ORDER — OXYCODONE HCL 5 MG/5ML PO SOLN: 5.0000 mg | Freq: Once | ORAL | Status: DC | PRN

## 2022-06-02 MED ORDER — TRAMADOL HCL 50 MG PO TABS
50.0000 mg | ORAL_TABLET | Freq: Four times a day (QID) | ORAL | Status: DC
  Administered 2022-06-02 – 2022-06-03 (×3): 50 mg via ORAL
  Filled 2022-06-02 (×3): qty 1

## 2022-06-02 MED ORDER — ASPIRIN 81 MG PO CHEW
81.0000 mg | CHEWABLE_TABLET | Freq: Two times a day (BID) | ORAL | Status: DC
  Administered 2022-06-02 – 2022-06-03 (×2): 81 mg via ORAL
  Filled 2022-06-02 (×2): qty 1

## 2022-06-02 MED ORDER — POVIDONE-IODINE 10 % EX SWAB
2.0000 | Freq: Once | CUTANEOUS | Status: AC
  Administered 2022-06-02: 2 via TOPICAL

## 2022-06-02 MED ORDER — ORAL CARE MOUTH RINSE: 15.0000 mL | Freq: Once | OROMUCOSAL | Status: AC

## 2022-06-02 MED ORDER — FENTANYL CITRATE (PF) 250 MCG/5ML IJ SOLN
INTRAMUSCULAR | Status: AC
  Filled 2022-06-02: qty 5

## 2022-06-02 MED ORDER — OXYCODONE HCL 5 MG PO TABS: 5.0000 mg | ORAL_TABLET | Freq: Once | ORAL | Status: DC | PRN

## 2022-06-02 MED ORDER — BUPIVACAINE IN DEXTROSE 0.75-8.25 % IT SOLN
INTRATHECAL | Status: DC | PRN
  Administered 2022-06-02: 2 mL via INTRATHECAL

## 2022-06-02 MED ORDER — METHOCARBAMOL 1000 MG/10ML IJ SOLN: 500.0000 mg | Freq: Four times a day (QID) | INTRAVENOUS | Status: DC | PRN

## 2022-06-02 MED ORDER — METOCLOPRAMIDE HCL 5 MG/ML IJ SOLN: 5.0000 mg | Freq: Three times a day (TID) | INTRAMUSCULAR | Status: DC | PRN

## 2022-06-02 MED ORDER — ONDANSETRON HCL 4 MG/2ML IJ SOLN: 4.0000 mg | Freq: Four times a day (QID) | INTRAMUSCULAR | Status: DC | PRN

## 2022-06-02 MED ORDER — OXYCODONE HCL 5 MG PO TABS
5.0000 mg | ORAL_TABLET | ORAL | Status: DC | PRN
  Administered 2022-06-02: 5 mg via ORAL
  Filled 2022-06-02: qty 1

## 2022-06-02 MED ORDER — DIPHENHYDRAMINE HCL 12.5 MG/5ML PO ELIX: 12.5000 mg | ORAL_SOLUTION | ORAL | Status: DC | PRN

## 2022-06-02 MED ORDER — MIDAZOLAM HCL 2 MG/2ML IJ SOLN
INTRAMUSCULAR | Status: DC | PRN
  Administered 2022-06-02: 2 mg via INTRAVENOUS

## 2022-06-02 MED ORDER — DOCUSATE SODIUM 100 MG PO CAPS
100.0000 mg | ORAL_CAPSULE | Freq: Two times a day (BID) | ORAL | Status: DC
  Administered 2022-06-02 – 2022-06-03 (×3): 100 mg via ORAL
  Filled 2022-06-02 (×3): qty 1

## 2022-06-02 MED ORDER — PHENYLEPHRINE HCL (PRESSORS) 10 MG/ML IV SOLN
INTRAVENOUS | Status: AC
  Filled 2022-06-02: qty 1

## 2022-06-02 MED ORDER — HYDROMORPHONE HCL 1 MG/ML IJ SOLN: 0.2500 mg | INTRAMUSCULAR | Status: DC | PRN

## 2022-06-02 MED ORDER — PROPOFOL 500 MG/50ML IV EMUL
INTRAVENOUS | Status: DC | PRN
  Administered 2022-06-02: 100 ug/kg/min via INTRAVENOUS

## 2022-06-02 MED ORDER — ONDANSETRON HCL 4 MG PO TABS: 4.0000 mg | ORAL_TABLET | Freq: Four times a day (QID) | ORAL | Status: DC | PRN

## 2022-06-02 MED ORDER — PHENYLEPHRINE HCL-NACL 20-0.9 MG/250ML-% IV SOLN
INTRAVENOUS | Status: DC | PRN
  Administered 2022-06-02 (×2): 160 ug via INTRAVENOUS
  Administered 2022-06-02: 50 ug/min via INTRAVENOUS

## 2022-06-02 MED ORDER — CHLORTHALIDONE 25 MG PO TABS
25.0000 mg | ORAL_TABLET | Freq: Every day | ORAL | Status: DC
  Administered 2022-06-03: 25 mg via ORAL
  Filled 2022-06-02 (×2): qty 1

## 2022-06-02 MED ORDER — ADULT MULTIVITAMIN W/MINERALS CH
1.0000 | ORAL_TABLET | Freq: Every morning | ORAL | Status: DC
  Administered 2022-06-03: 1 via ORAL
  Filled 2022-06-02: qty 1

## 2022-06-02 MED ORDER — ALUM & MAG HYDROXIDE-SIMETH 200-200-20 MG/5ML PO SUSP
30.0000 mL | ORAL | Status: DC | PRN
  Administered 2022-06-02: 30 mL via ORAL
  Filled 2022-06-02: qty 30

## 2022-06-02 MED ORDER — 0.9 % SODIUM CHLORIDE (POUR BTL) OPTIME
TOPICAL | Status: DC | PRN
  Administered 2022-06-02: 1000 mL

## 2022-06-02 MED ORDER — PANTOPRAZOLE SODIUM 40 MG PO TBEC
40.0000 mg | DELAYED_RELEASE_TABLET | Freq: Every day | ORAL | Status: DC
  Administered 2022-06-03: 40 mg via ORAL
  Filled 2022-06-02: qty 1

## 2022-06-02 MED ORDER — CHLORHEXIDINE GLUCONATE 0.12 % MT SOLN
15.0000 mL | Freq: Once | OROMUCOSAL | Status: AC
  Administered 2022-06-02: 15 mL via OROMUCOSAL
  Filled 2022-06-02: qty 15

## 2022-06-02 MED ORDER — BISOPROLOL FUMARATE 10 MG PO TABS
10.0000 mg | ORAL_TABLET | Freq: Every morning | ORAL | Status: DC
  Administered 2022-06-03: 10 mg via ORAL
  Filled 2022-06-02: qty 1

## 2022-06-02 MED ORDER — HYDROMORPHONE HCL 1 MG/ML IJ SOLN: 0.5000 mg | INTRAMUSCULAR | Status: DC | PRN

## 2022-06-02 MED ORDER — LISINOPRIL 20 MG PO TABS
40.0000 mg | ORAL_TABLET | Freq: Every day | ORAL | Status: DC
  Administered 2022-06-03: 40 mg via ORAL
  Filled 2022-06-02: qty 2

## 2022-06-02 MED ORDER — ROSUVASTATIN CALCIUM 5 MG PO TABS
10.0000 mg | ORAL_TABLET | Freq: Every day | ORAL | Status: DC
  Administered 2022-06-03: 10 mg via ORAL
  Filled 2022-06-02: qty 2

## 2022-06-02 MED ORDER — LACTATED RINGERS IV SOLN: INTRAVENOUS | Status: DC

## 2022-06-02 MED ORDER — ACETAMINOPHEN 325 MG PO TABS: 325.0000 mg | ORAL_TABLET | Freq: Four times a day (QID) | ORAL | Status: DC | PRN

## 2022-06-02 MED ORDER — OXYCODONE HCL 5 MG PO TABS
10.0000 mg | ORAL_TABLET | ORAL | Status: DC | PRN
  Administered 2022-06-02: 15 mg via ORAL
  Filled 2022-06-02: qty 3

## 2022-06-02 SURGICAL SUPPLY — 57 items
ACETAB CUP W/GRIPTION 54 (Plate) ×1 IMPLANT
BAG COUNTER SPONGE SURGICOUNT (BAG) ×1 IMPLANT
BENZOIN TINCTURE PRP APPL 2/3 (GAUZE/BANDAGES/DRESSINGS) ×1 IMPLANT
BLADE CLIPPER SURG (BLADE) IMPLANT
BLADE SAW SGTL 18X1.27X75 (BLADE) ×1 IMPLANT
COVER SURGICAL LIGHT HANDLE (MISCELLANEOUS) ×1 IMPLANT
CUP ACETAB W/GRIPTION 54 (Plate) IMPLANT
DRAPE C-ARM 42X72 X-RAY (DRAPES) ×1 IMPLANT
DRAPE STERI IOBAN 125X83 (DRAPES) ×1 IMPLANT
DRAPE U-SHAPE 47X51 STRL (DRAPES) ×3 IMPLANT
DRESSING AQUACEL AG SP 3.5X10 (GAUZE/BANDAGES/DRESSINGS) IMPLANT
DRSG AQUACEL AG ADV 3.5X10 (GAUZE/BANDAGES/DRESSINGS) ×1 IMPLANT
DRSG AQUACEL AG SP 3.5X10 (GAUZE/BANDAGES/DRESSINGS) ×1
DRSG XEROFORM 1X8 (GAUZE/BANDAGES/DRESSINGS) IMPLANT
DURAPREP 26ML APPLICATOR (WOUND CARE) ×1 IMPLANT
ELECT BLADE 4.0 EZ CLEAN MEGAD (MISCELLANEOUS) ×1
ELECT BLADE 6.5 EXT (BLADE) IMPLANT
ELECT REM PT RETURN 9FT ADLT (ELECTROSURGICAL) ×1
ELECTRODE BLDE 4.0 EZ CLN MEGD (MISCELLANEOUS) ×1 IMPLANT
ELECTRODE REM PT RTRN 9FT ADLT (ELECTROSURGICAL) ×1 IMPLANT
FACESHIELD WRAPAROUND (MASK) ×2 IMPLANT
FACESHIELD WRAPAROUND OR TEAM (MASK) ×2 IMPLANT
GLOVE BIOGEL PI IND STRL 8 (GLOVE) ×2 IMPLANT
GLOVE ECLIPSE 8.0 STRL XLNG CF (GLOVE) ×1 IMPLANT
GLOVE ORTHO TXT STRL SZ7.5 (GLOVE) ×2 IMPLANT
GOWN STRL REUS W/ TWL LRG LVL3 (GOWN DISPOSABLE) ×2 IMPLANT
GOWN STRL REUS W/ TWL XL LVL3 (GOWN DISPOSABLE) ×2 IMPLANT
GOWN STRL REUS W/TWL LRG LVL3 (GOWN DISPOSABLE) ×2
GOWN STRL REUS W/TWL XL LVL3 (GOWN DISPOSABLE) ×2
HANDPIECE INTERPULSE COAX TIP (DISPOSABLE) ×1
HEAD M SROM 36MM PLUS 1.5 (Hips) IMPLANT
KIT BASIN OR (CUSTOM PROCEDURE TRAY) ×1 IMPLANT
KIT TURNOVER KIT B (KITS) ×1 IMPLANT
LINER NEUTRAL 54X36MM PLUS 4 (Hips) IMPLANT
MANIFOLD NEPTUNE II (INSTRUMENTS) ×1 IMPLANT
NS IRRIG 1000ML POUR BTL (IV SOLUTION) ×1 IMPLANT
PACK TOTAL JOINT (CUSTOM PROCEDURE TRAY) ×1 IMPLANT
PAD ARMBOARD 7.5X6 YLW CONV (MISCELLANEOUS) ×1 IMPLANT
SET HNDPC FAN SPRY TIP SCT (DISPOSABLE) ×1 IMPLANT
SROM M HEAD 36MM PLUS 1.5 (Hips) ×1 IMPLANT
STAPLER VISISTAT 35W (STAPLE) IMPLANT
STEM FEM ACTIS HIGH SZ7 (Stem) IMPLANT
STRIP CLOSURE SKIN 1/2X4 (GAUZE/BANDAGES/DRESSINGS) ×2 IMPLANT
SUT ETHIBOND NAB CT1 #1 30IN (SUTURE) ×1 IMPLANT
SUT MNCRL AB 4-0 PS2 18 (SUTURE) IMPLANT
SUT VIC AB 0 CT1 27 (SUTURE) ×1
SUT VIC AB 0 CT1 27XBRD ANBCTR (SUTURE) ×1 IMPLANT
SUT VIC AB 1 CT1 27 (SUTURE) ×1
SUT VIC AB 1 CT1 27XBRD ANBCTR (SUTURE) ×1 IMPLANT
SUT VIC AB 2-0 CT1 27 (SUTURE) ×2
SUT VIC AB 2-0 CT1 TAPERPNT 27 (SUTURE) ×1 IMPLANT
TOWEL GREEN STERILE (TOWEL DISPOSABLE) ×1 IMPLANT
TOWEL GREEN STERILE FF (TOWEL DISPOSABLE) ×1 IMPLANT
TRAY CATH 16FR W/PLASTIC CATH (SET/KITS/TRAYS/PACK) IMPLANT
TRAY FOLEY W/BAG SLVR 16FR (SET/KITS/TRAYS/PACK)
TRAY FOLEY W/BAG SLVR 16FR ST (SET/KITS/TRAYS/PACK) IMPLANT
WATER STERILE IRR 1000ML POUR (IV SOLUTION) ×2 IMPLANT

## 2022-06-02 NOTE — Evaluation (Signed)
Physical Therapy Evaluation Patient Details Name: MACE WEINBERG MRN: [id number] DOB: [birth year] Today's Date: 06/02/2022  History of Present Illness  77 y.o. male presents to [facility] hospital on 06/02/2022 for elective R THA. PMH includes CAD, DM, HLD, HTN, spinal stenosis.  Clinical Impression  Pt presents to PT with deficits in functional mobility, strength, power, endurance. Pt with post-op pain, as expected, although reports improved comfort when mobilizing compared to pre-surgery. Pt is able to transfer and ambulate without physical assistance when utilizing RW. Pt will benefit from frequent mobilization and stair training tomorrow prior to discharge.       Recommendations for follow up therapy are one component of a multi-disciplinary discharge planning process, led by the attending physician.  Recommendations may be updated based on patient status, additional functional criteria and insurance authorization.  Follow Up Recommendations Follow physician's recommendations for discharge plan and follow up therapies      Assistance Recommended at Discharge Intermittent Supervision/Assistance  Patient can return home with the following  A little help with bathing/dressing/bathroom;Assistance with cooking/housework;Assist for transportation;Help with stairs or ramp for entrance    Equipment Recommendations None recommended by PT  Recommendations for Other Services       Functional Status Assessment Patient has had a recent decline in their functional status and demonstrates the ability to make significant improvements in function in a reasonable and predictable amount of time.     Precautions / Restrictions Precautions Precautions: Fall Restrictions Weight Bearing Restrictions: Yes RLE Weight Bearing: Weight bearing as tolerated      Mobility  Bed Mobility Overal bed mobility: Needs Assistance Bed Mobility: Rolling, Sidelying to Sit Rolling: Supervision Sidelying to sit:  Supervision       General bed mobility comments: use of rails, verbal cues    Transfers Overall transfer level: Needs assistance Equipment used: Rolling walker (2 wheels) Transfers: Sit to/from Stand Sit to Stand: Supervision                Ambulation/Gait Ambulation/Gait assistance: Supervision Gait Distance (Feet): 150 Feet Assistive device: Rolling walker (2 wheels) Gait Pattern/deviations: Step-through pattern Gait velocity: reduced Gait velocity interpretation: <1.8 ft/sec, indicate of risk for recurrent falls   General Gait Details: slowed step-through gait  Stairs            Wheelchair Mobility    Modified Rankin (Stroke Patients Only)       Balance Overall balance assessment: Needs assistance Sitting-balance support: No upper extremity supported, Feet supported Sitting balance-Leahy Scale: Good     Standing balance support: Reliant on assistive device for balance, Single extremity supported Standing balance-Leahy Scale: Poor                               Pertinent Vitals/Pain Pain Assessment Pain Assessment: 0-10 Pain Score: 5  Pain Location: R hip Pain Descriptors / Indicators: Sore Pain Intervention(s): Monitored during session    Home Living Family/patient expects to be discharged to:: Private residence Living Arrangements: Alone Available Help at Discharge: Family;Available 24 hours/day (son initially) Type of Home: House Home Access: Stairs to enter Entrance Stairs-Rails: Can reach both Entrance Stairs-Number of Steps: 4   Home Layout: One level Home Equipment: Conservation officer, nature (2 wheels);Cane - single point;Shower seat;Grab bars - tub/shower      Prior Function Prior Level of Function : Independent/Modified Independent;Driving             Mobility Comments: ambulating with RW in  the home, SPC to get to car       Hand Dominance        Extremity/Trunk Assessment   Upper Extremity Assessment Upper  Extremity Assessment: Overall WFL for tasks assessed    Lower Extremity Assessment Lower Extremity Assessment: RLE deficits/detail RLE Deficits / Details: generalized post-op weakness, 3+/5 grossly    Cervical / Trunk Assessment Cervical / Trunk Assessment: Normal  Communication   Communication: No difficulties  Cognition Arousal/Alertness: Awake/alert Behavior During Therapy: WFL for tasks assessed/performed Overall Cognitive Status: Within Functional Limits for tasks assessed                                          General Comments General comments (skin integrity, edema, etc.): VSS on RA, BP 116/71    Exercises     Assessment/Plan    PT Assessment Patient needs continued PT services  PT Problem List Decreased strength;Decreased activity tolerance;Decreased balance;Decreased mobility;Decreased knowledge of use of DME;Pain       PT Treatment Interventions DME instruction;Gait training;Stair training;Functional mobility training;Therapeutic activities;Therapeutic exercise;Balance training;Neuromuscular re-education;Patient/family education    PT Goals (Current goals can be found in the Care Plan section)  Acute Rehab PT Goals Patient Stated Goal: to go home PT Goal Formulation: With patient Time For Goal Achievement: 06/07/22 Potential to Achieve Goals: Good    Frequency 7X/week     Co-evaluation               AM-PAC PT "6 Clicks" Mobility  Outcome Measure Help needed turning from your back to your side while in a flat bed without using bedrails?: A Little Help needed moving from lying on your back to sitting on the side of a flat bed without using bedrails?: A Little Help needed moving to and from a bed to a chair (including a wheelchair)?: A Little Help needed standing up from a chair using your arms (e.g., wheelchair or bedside chair)?: A Little Help needed to walk in hospital room?: A Little Help needed climbing 3-5 steps with a  railing? : A Little 6 Click Score: 18    End of Session   Activity Tolerance: Patient tolerated treatment well Patient left: in bed;with call bell/phone within reach;with family/visitor present Nurse Communication: Mobility status PT Visit Diagnosis: Other abnormalities of gait and mobility (R26.89);Muscle weakness (generalized) (M62.81);Pain Pain - Right/Left: Right Pain - part of body: Hip    Time: 9381-8299 PT Time Calculation (min) (ACUTE ONLY): 31 min   Charges:   PT Evaluation $PT Eval Low Complexity: 1 Low          Zenaida Niece, PT, DPT Acute Rehabilitation Office [phone]   Zenaida Niece 06/02/2022, 4:50 PM

## 2022-06-02 NOTE — Anesthesia Postprocedure Evaluation (Signed)
Anesthesia Post Note  Patient: Alex Jackson  Procedure(s) Performed: RIGHT TOTAL HIP ARTHROPLASTY (Right: Hip)     Patient location during evaluation: PACU Anesthesia Type: Spinal Level of consciousness: oriented and awake and alert Pain management: pain level controlled Vital Signs Assessment: post-procedure vital signs reviewed and stable Respiratory status: spontaneous breathing, respiratory function stable and nonlabored ventilation Cardiovascular status: blood pressure returned to baseline and stable Postop Assessment: no headache, no backache, no apparent nausea or vomiting, patient able to bend at knees and spinal receding Anesthetic complications: no   No notable events documented.  Last Vitals:  Vitals:   06/02/22 1150 06/02/22 1210  BP: (!) 89/59 97/62  Pulse: 70 64  Resp: 18 (!) 21  Temp:    SpO2: 95% 95%    Last Pain:  Vitals:   06/02/22 1135  TempSrc:   PainSc: 0-No pain                 Karne Ozga A.

## 2022-06-02 NOTE — Plan of Care (Signed)

## 2022-06-02 NOTE — Transfer of Care (Signed)
Immediate Anesthesia Transfer of Care Note  Patient: Alex Jackson  Procedure(s) Performed: RIGHT TOTAL HIP ARTHROPLASTY (Right: Hip)  Patient Location: PACU  Anesthesia Type:MAC and Spinal  Level of Consciousness: awake and patient cooperative  Airway & Oxygen Therapy: Patient Spontanous Breathing and Patient connected to face mask oxygen  Post-op Assessment: Report given to RN and Post -op Vital signs reviewed and stable  Post vital signs: Reviewed and stable  Last Vitals:  Vitals Value Taken Time  BP 98/60 06/02/22 1137  Temp    Pulse 74 06/02/22 1138  Resp 17 06/02/22 1138  SpO2 96 % 06/02/22 1138  Vitals shown include unvalidated device data.  Last Pain:  Vitals:   06/02/22 0756  TempSrc: Oral  PainSc: 0-No pain      Patients Stated Pain Goal: 0 (48/88/91 [id number])  Complications: No notable events documented.

## 2022-06-02 NOTE — Interval H&P Note (Signed)
History and Physical Interval Note: Patient understands fully that he is here today for right hip replacement to treat his severe right hip arthritis.  There has been no acute or interval changes in medical status.  See recent H&P.  The risks and benefits of surgery been explained in detail and informed consent is obtained.  The right operative hip has been marked.  06/02/2022 9:15 AM  Alex Jackson  has presented today for surgery, with the diagnosis of osteoarthritis right hip.  The various methods of treatment have been discussed with the patient and family. After consideration of risks, benefits and other options for treatment, the patient has consented to  Procedure(s): RIGHT TOTAL HIP ARTHROPLASTY ANTERIOR APPROACH (Right) as a surgical intervention.  The patient's history has been reviewed, patient examined, no change in status, stable for surgery.  I have reviewed the patient's chart and labs.  Questions were answered to the patient's satisfaction.     Mcarthur Rossetti

## 2022-06-02 NOTE — Op Note (Signed)
Operative Note  Operation: 06/02/2022 Preoperative diagnosis: Right hip primary osteoarthritis Postoperative diagnosis: Same  Procedure: Right direct anterior total hip arthroplasty  Implants: DePuy sector GRIPTION acetabular component size 54, 36+4 polythene liner, size 7 Actis femoral component with high offset, 36+1.5 metal head ball  Surgeon: Lind Guest. Ninfa Linden, MD Assistant: Benita Stabile, PA-C  Anesthesia: Spinal Antibiotics: IV vancomycin and IV Ancef EBL: 161 cc Complications: None  Indications: The patient is a 77 year old active individual with severe debilitating end-stage arthritis of his right hip that has been well-documented with x-ray findings and clinical exam findings.  He has tried and failed all forms of conservative treatment including intra-articular steroid injections.  At this point his right hip pain is daily and it is 10 out of 10.  It is detrimentally affecting his mobility, his quality of life, and his actives daily living to the point he wishes to proceed with a hip replacement on the right side.  We agree with this as well.  We talked in length in detail about the risk of acute blood loss anemia, nerve or vessel injury, fracture, infection, DVT, implant failure, dislocation, leg length differences and wound healing issues.  We talked about her goals being hopefully decrease pain, improve mobility and overall improve quality of life.  Procedure description: After informed consent was obtained and the appropriate right hip was marked, the patient was brought to the operating room and set up on the stretcher where spinal anesthesia was obtained.  He was then laid in supine position on the stretcher and a Foley catheter was placed.  We assess his leg length and then placed traction boots on both his feet.  Next he was placed supine on the Hana fracture table with a perineal post in place in both legs and inline skeletal traction devices but no traction applied.  We  assessed his pelvis and right hip radiographically and then prepped the right hip with DuraPrep and sterile drapes.  A timeout was called and he was identified as the correct patient and the correct right hip.  An incision was then made just inferior and posterior to the ASIS and carried slightly obliquely down the leg.  Dissection was carried down to the tensor fascia lata muscle and the tensor fascia was then divided longitudinally to proceed with a direct anterior approach to the hip.  Circumflex vessels were identified and cauterized.  The hip capsule was identified and opened up in L-type format finding a moderate joint effusion.  Right away significant periarticular osteophytes could be seen around the lateral femoral head and neck.  An oscillating saw was then used to make a femoral neck cut just proximal to the lesser trochanter and this was completed with an osteotome.  A corkscrew guide was placed in the femoral head and the femoral head was removed in its entirety and found to be a wide area devoid of cartilage.  A bent Hohmann was then placed over the medial acetabular rim and remnants of the acetabular labrum and other debris removed.  We then began reaming under direct visualization from a size 43 reamer and stepwise increments going up to a size 53 reamer with all reamers placed under direct visualization and the last reamer placed under direct fluoroscopy in order to gain the depth of reaming, the inclination and the anteversion.  The real DePuy Sectra GRIPTION acetabular component size 54 was then placed without difficulty and we again assessed it radiographically.  We then went with a 36+4 polyethylene liner  for that size 54 acetabular component.  Attention was then turned to the femur.  With the right leg externally rotated to 120 degrees, extended and adducted, a Mueller retractor was placed by the medial calcar and a long bent Hohmann was placed behind the greater trochanter.  The lateral joint  capsule was released and a box cutting osteotome was used in the femoral canal.  Broaching was then initiated from a size 0 broach using the Actis broaching system to a size 7 broach.  We then trialed a high offset femoral neck based off his anatomy with a 36+1.5 trial hip ball.  This was reduced into the acetabulum and we are able to assess it radiographically and clinically and we are pleased with leg length, range of motion, offset and stability.  The hip was then dislocated and the trial components were removed.  The real Actis femoral component with high offset was placed followed by the real 36+1.5 metal head ball.  Again this was reduced into the acetabulum and we are pleased overall.  The soft tissue was then irrigated normal saline solution.  We assessed the hip again radiographically and clinically.  The joint capsule was then closed with interrupted #1 Ethibond suture followed by our #1 Vicryl to close the tensor fascia.  0 Vicryl was used to close the deep tissue and 2-0 Vicryl was used to close the subcutaneous tissue.  The skin was closed with staples.  An Aquacel dressing was applied.  The patient was taken off of the Hana table and taken the recovery room in stable addition with all final counts being correct and no complications noted.  Benita Stabile, PA-C did assisted in the entire case from beginning to end and his assistance was crucial medically necessary for soft tissue management and retraction, helping guide implant placement and a layered closure of the wound.

## 2022-06-02 NOTE — Anesthesia Procedure Notes (Signed)
Spinal  Patient location during procedure: OR Start time: 06/02/2022 9:58 AM End time: 06/02/2022 10:02 AM Reason for block: surgical anesthesia Staffing Performed: anesthesiologist  Anesthesiologist: Josephine Igo, MD Performed by: Josephine Igo, MD Authorized by: Josephine Igo, MD   Preanesthetic Checklist Completed: patient identified, IV checked, site marked, risks and benefits discussed, surgical consent, monitors and equipment checked, pre-op evaluation and timeout performed Spinal Block Patient position: sitting Prep: DuraPrep and site prepped and draped Patient monitoring: heart rate, cardiac monitor, continuous pulse ox and blood pressure Approach: midline Location: L3-4 Injection technique: single-shot Needle Needle type: Pencan  Needle gauge: 24 G Needle length: 9 cm Needle insertion depth: 7 cm Assessment Sensory level: T6 Events: CSF return Additional Notes Patient tolerated procedure well. Adequate sensory level.

## 2022-06-03 ENCOUNTER — Encounter (HOSPITAL_COMMUNITY): Payer: Self-pay | Admitting: Orthopaedic Surgery

## 2022-06-03 DIAGNOSIS — M1611 Unilateral primary osteoarthritis, right hip: Secondary | ICD-10-CM | POA: Diagnosis not present

## 2022-06-03 DIAGNOSIS — E1169 Type 2 diabetes mellitus with other specified complication: Secondary | ICD-10-CM | POA: Diagnosis not present

## 2022-06-03 DIAGNOSIS — I10 Essential (primary) hypertension: Secondary | ICD-10-CM | POA: Diagnosis not present

## 2022-06-03 DIAGNOSIS — I2510 Atherosclerotic heart disease of native coronary artery without angina pectoris: Secondary | ICD-10-CM | POA: Diagnosis not present

## 2022-06-03 DIAGNOSIS — E785 Hyperlipidemia, unspecified: Secondary | ICD-10-CM | POA: Diagnosis not present

## 2022-06-03 DIAGNOSIS — I5032 Chronic diastolic (congestive) heart failure: Secondary | ICD-10-CM | POA: Diagnosis not present

## 2022-06-03 LAB — BASIC METABOLIC PANEL
Anion gap: 10 (ref 5–15)
BUN: 20 mg/dL (ref 8–23)
CO2: 23 mmol/L (ref 22–32)
Calcium: 8.8 mg/dL — ABNORMAL LOW (ref 8.9–10.3)
Chloride: 101 mmol/L (ref 98–111)
Creatinine, Ser: 1.34 mg/dL — ABNORMAL HIGH (ref 0.61–1.24)
GFR, Estimated: 55 mL/min — ABNORMAL LOW (ref >60)
Glucose, Bld: 124 mg/dL — ABNORMAL HIGH (ref 70–99)
Potassium: 3.5 mmol/L (ref 3.5–5.1)
Sodium: 134 mmol/L — ABNORMAL LOW (ref 135–145)

## 2022-06-03 LAB — CBC
HCT: 33.6 % — ABNORMAL LOW (ref 39.0–52.0)
Hemoglobin: 11.4 g/dL — ABNORMAL LOW (ref 13.0–17.0)
MCH: 31.1 pg (ref 26.0–34.0)
MCHC: 33.9 g/dL (ref 30.0–36.0)
MCV: 91.6 fL (ref 80.0–100.0)
Platelets: 219 10*3/uL (ref 150–400)
RBC: 3.67 MIL/uL — ABNORMAL LOW (ref 4.22–5.81)
RDW: 12.9 % (ref 11.5–15.5)
WBC: 12.5 10*3/uL — ABNORMAL HIGH (ref 4.0–10.5)
nRBC: 0 % (ref 0.0–0.2)

## 2022-06-03 MED ORDER — METHOCARBAMOL 500 MG PO TABS: 500.0000 mg | ORAL_TABLET | Freq: Four times a day (QID) | ORAL | 1 refills | Status: DC | PRN

## 2022-06-03 MED ORDER — TRAMADOL HCL 50 MG PO TABS: 50.0000 mg | ORAL_TABLET | Freq: Four times a day (QID) | ORAL | 0 refills | Status: DC | PRN

## 2022-06-03 MED ORDER — ASPIRIN 81 MG PO CHEW: 81.0000 mg | CHEWABLE_TABLET | Freq: Two times a day (BID) | ORAL | 0 refills | Status: AC

## 2022-06-03 NOTE — Plan of Care (Signed)

## 2022-06-03 NOTE — Progress Notes (Signed)
Physical Therapy Treatment Patient Details Name: Alex Jackson MRN: [id number] DOB: 31-May-1945 Today's Date: 06/03/2022   History of Present Illness 77 y.o. male presents to [facility] hospital on 06/02/2022 for elective R THA. PMH includes CAD, DM, HLD, HTN, spinal stenosis.    PT Comments    Pt reporting RLE and quad pain this date, states he is eager to d/c home. Pt ambulatory in hallway with antalgic gait, min cues for sequencing and safety needed but states his son will be able to help him at home. Pt proficiently navigated steps, demonstrating ability to enter home at d/c. PT training and education complete, pt with no further questions. Pt appropriate to d/c from acute setting from PT standpoint.      Recommendations for follow up therapy are one component of a multi-disciplinary discharge planning process, led by the attending physician.  Recommendations may be updated based on patient status, additional functional criteria and insurance authorization.  Follow Up Recommendations  Follow physician's recommendations for discharge plan and follow up therapies     Assistance Recommended at Discharge Intermittent Supervision/Assistance  Patient can return home with the following A little help with bathing/dressing/bathroom;Assistance with cooking/housework;Assist for transportation;Help with stairs or ramp for entrance   Equipment Recommendations  None recommended by PT    Recommendations for Other Services       Precautions / Restrictions Precautions Precautions: Fall Restrictions Weight Bearing Restrictions: Yes RLE Weight Bearing: Weight bearing as tolerated     Mobility  Bed Mobility Overal bed mobility: Needs Assistance Bed Mobility: Supine to Sit     Supine to sit: Min assist     General bed mobility comments: assist for RLE translation to EOB, trunk elevation. Pt has an adjustable bed at home and will have son to physically assist at d/c    Transfers Overall  transfer level: Needs assistance Equipment used: Rolling walker (2 wheels) Transfers: Sit to/from Stand Sit to Stand: Supervision           General transfer comment: for safety, cues for hand placement when rising/sitting. STS x3, from EOB and w/c x2 with rest breaks    Ambulation/Gait Ambulation/Gait assistance: Min guard Gait Distance (Feet): 125 Feet Assistive device: Rolling walker (2 wheels) Gait Pattern/deviations: Step-through pattern, Decreased stride length, Antalgic, Trunk flexed Gait velocity: decr     General Gait Details: cues for upright posture, placement inside RW, increasing step length RLE. seated rest break at 100 ft, then walked 50 ft to/from gym for stair practice   Stairs Stairs: Yes Stairs assistance: Min guard Stair Management: Two rails, Step to pattern, Forwards Number of Stairs: 3 General stair comments: cues for sequencing and step-to pattern   Wheelchair Mobility    Modified Rankin (Stroke Patients Only)       Balance Overall balance assessment: Needs assistance Sitting-balance support: No upper extremity supported, Feet supported Sitting balance-Leahy Scale: Good     Standing balance support: Reliant on assistive device for balance, Single extremity supported Standing balance-Leahy Scale: Poor                              Cognition Arousal/Alertness: Awake/alert Behavior During Therapy: WFL for tasks assessed/performed Overall Cognitive Status: Within Functional Limits for tasks assessed  Exercises Other Exercises Other Exercises: PT verbally reviewed THA exercises and frequency in which to perform, pt has handout and expresses understanding stating he does not need to practice them as he was doing them before surgery and has PT appointment on friday    General Comments        Pertinent Vitals/Pain Pain Assessment Pain Assessment: Faces Faces Pain Scale:  Hurts even more Pain Location: R hip, quad Pain Descriptors / Indicators: Sore Pain Intervention(s): Limited activity within patient's tolerance, Repositioned, Monitored during session    Home Living                          Prior Function            PT Goals (current goals can now be found in the care plan section) Acute Rehab PT Goals Patient Stated Goal: to go home PT Goal Formulation: With patient Time For Goal Achievement: 06/07/22 Potential to Achieve Goals: Good Progress towards PT goals: Progressing toward goals    Frequency    7X/week      PT Plan Current plan remains appropriate    Co-evaluation              AM-PAC PT "6 Clicks" Mobility   Outcome Measure  Help needed turning from your back to your side while in a flat bed without using bedrails?: A Little Help needed moving from lying on your back to sitting on the side of a flat bed without using bedrails?: A Little Help needed moving to and from a bed to a chair (including a wheelchair)?: A Little Help needed standing up from a chair using your arms (e.g., wheelchair or bedside chair)?: A Little Help needed to walk in hospital room?: A Little Help needed climbing 3-5 steps with a railing? : A Little 6 Click Score: 18    End of Session   Activity Tolerance: Patient tolerated treatment well Patient left: in bed;with call bell/phone within reach;with family/visitor present Nurse Communication: Mobility status PT Visit Diagnosis: Other abnormalities of gait and mobility (R26.89);Muscle weakness (generalized) (M62.81);Pain Pain - Right/Left: Right Pain - part of body: Hip     Time: 0935-1005 PT Time Calculation (min) (ACUTE ONLY): 30 min  Charges:  $Gait Training: 23-37 mins                     Stacie Glaze, PT DPT Acute Rehabilitation Services Pager [phone]  Office [phone]    [name] 06/03/2022, 11:46 AM

## 2022-06-03 NOTE — Progress Notes (Signed)
Subjective: 1 Day Post-Op Procedure(s) (LRB): RIGHT TOTAL HIP ARTHROPLASTY (Right) Patient reports pain as moderate.    Objective: Vital signs in last 24 hours: Temp:  [97.5 F (36.4 C)-98.1 F (36.7 C)] 98.1 F (36.7 C) (01/31 0440) Pulse Rate:  [63-81] 80 (01/31 0440) Resp:  [13-21] 18 (01/31 0440) BP: (85-130)/(43-93) 119/56 (01/31 0440) SpO2:  [94 %-99 %] 98 % (01/31 0440) Weight:  [161 kg] 107 kg (01/30 0756)  Intake/Output from previous day: 01/30 0701 - 01/31 0700 In: 1620 [P.O.:120; I.V.:1500] Out: 700 [Urine:500; Blood:200] Intake/Output this shift: No intake/output data recorded.  Recent Labs    06/03/22 0140  HGB 11.4*   Recent Labs    06/03/22 0140  WBC 12.5*  RBC 3.67*  HCT 33.6*  PLT 219   Recent Labs    06/03/22 0140  NA 134*  K 3.5  CL 101  CO2 23  BUN 20  CREATININE 1.34*  GLUCOSE 124*  CALCIUM 8.8*   No results for input(s): "LABPT", "INR" in the last 72 hours.  Sensation intact distally Intact pulses distally Dorsiflexion/Plantar flexion intact Incision: dressing C/D/I   Assessment/Plan: 1 Day Post-Op Procedure(s) (LRB): RIGHT TOTAL HIP ARTHROPLASTY (Right) Up with therapy  Discharge to home this afternoon.    Mcarthur Rossetti 06/03/2022, 7:32 AM

## 2022-06-03 NOTE — Plan of Care (Signed)
Problem: Education: Goal: Knowledge of the prescribed therapeutic regimen will improve 06/03/2022 1202 by Myrene Buddy, RN Outcome: Adequate for Discharge 06/03/2022 1200 by Myrene Buddy, RN Outcome: Adequate for Discharge Goal: Understanding of discharge needs will improve 06/03/2022 1202 by Myrene Buddy, RN Outcome: Adequate for Discharge 06/03/2022 1200 by Myrene Buddy, RN Outcome: Adequate for Discharge Goal: Individualized Educational Video(s) 06/03/2022 1202 by Myrene Buddy, RN Outcome: Adequate for Discharge 06/03/2022 1200 by Myrene Buddy, RN Outcome: Adequate for Discharge   Problem: Activity: Goal: Ability to avoid complications of mobility impairment will improve 06/03/2022 1202 by Myrene Buddy, RN Outcome: Adequate for Discharge 06/03/2022 1200 by Myrene Buddy, RN Outcome: Adequate for Discharge Goal: Ability to tolerate increased activity will improve 06/03/2022 1202 by Myrene Buddy, RN Outcome: Adequate for Discharge 06/03/2022 1200 by Myrene Buddy, RN Outcome: Adequate for Discharge   Problem: Clinical Measurements: Goal: Postoperative complications will be avoided or minimized 06/03/2022 1202 by Myrene Buddy, RN Outcome: Adequate for Discharge 06/03/2022 1200 by Myrene Buddy, RN Outcome: Adequate for Discharge   Problem: Pain Management: Goal: Pain level will decrease with appropriate interventions 06/03/2022 1202 by Myrene Buddy, RN Outcome: Adequate for Discharge 06/03/2022 1200 by Myrene Buddy, RN Outcome: Adequate for Discharge   Problem: Skin Integrity: Goal: Will show signs of wound healing 06/03/2022 1202 by Myrene Buddy, RN Outcome: Adequate for Discharge 06/03/2022 1200 by Myrene Buddy, RN Outcome: Adequate for Discharge   Problem: Education: Goal: Knowledge of General Education information will improve Description: Including pain rating scale, medication(s)/side effects and  non-pharmacologic comfort measures 06/03/2022 1202 by Myrene Buddy, RN Outcome: Adequate for Discharge 06/03/2022 1200 by Myrene Buddy, RN Outcome: Adequate for Discharge   Problem: Health Behavior/Discharge Planning: Goal: Ability to manage health-related needs will improve 06/03/2022 1202 by Myrene Buddy, RN Outcome: Adequate for Discharge 06/03/2022 1200 by Myrene Buddy, RN Outcome: Adequate for Discharge   Problem: Clinical Measurements: Goal: Ability to maintain clinical measurements within normal limits will improve 06/03/2022 1202 by Myrene Buddy, RN Outcome: Adequate for Discharge 06/03/2022 1200 by Myrene Buddy, RN Outcome: Adequate for Discharge Goal: Will remain free from infection 06/03/2022 1202 by Myrene Buddy, RN Outcome: Adequate for Discharge 06/03/2022 1200 by Myrene Buddy, RN Outcome: Adequate for Discharge Goal: Diagnostic test results will improve 06/03/2022 1202 by Myrene Buddy, RN Outcome: Adequate for Discharge 06/03/2022 1200 by Myrene Buddy, RN Outcome: Adequate for Discharge Goal: Respiratory complications will improve 06/03/2022 1202 by Myrene Buddy, RN Outcome: Adequate for Discharge 06/03/2022 1200 by Myrene Buddy, RN Outcome: Adequate for Discharge Goal: Cardiovascular complication will be avoided 06/03/2022 1202 by Myrene Buddy, RN Outcome: Adequate for Discharge 06/03/2022 1200 by Myrene Buddy, RN Outcome: Adequate for Discharge   Problem: Activity: Goal: Risk for activity intolerance will decrease 06/03/2022 1202 by Myrene Buddy, RN Outcome: Adequate for Discharge 06/03/2022 1200 by Myrene Buddy, RN Outcome: Adequate for Discharge   Problem: Nutrition: Goal: Adequate nutrition will be maintained 06/03/2022 1202 by Myrene Buddy, RN Outcome: Adequate for Discharge 06/03/2022 1200 by Myrene Buddy, RN Outcome: Adequate for Discharge   Problem: Coping: Goal:  Level of anxiety will decrease 06/03/2022 1202 by Myrene Buddy, RN Outcome: Adequate for Discharge 06/03/2022 1200 by Myrene Buddy, RN Outcome: Adequate for Discharge   Problem: Elimination: Goal: Will not experience complications related to bowel motility  06/03/2022 1202 by Myrene Buddy, RN Outcome: Adequate for Discharge 06/03/2022 1200 by Myrene Buddy, RN Outcome: Adequate for Discharge Goal: Will not experience complications related to urinary retention 06/03/2022 1202 by Myrene Buddy, RN Outcome: Adequate for Discharge 06/03/2022 1200 by Myrene Buddy, RN Outcome: Adequate for Discharge   Problem: Pain Managment: Goal: General experience of comfort will improve 06/03/2022 1202 by Myrene Buddy, RN Outcome: Adequate for Discharge 06/03/2022 1200 by Myrene Buddy, RN Outcome: Adequate for Discharge   Problem: Safety: Goal: Ability to remain free from injury will improve 06/03/2022 1202 by Myrene Buddy, RN Outcome: Adequate for Discharge 06/03/2022 1200 by Myrene Buddy, RN Outcome: Adequate for Discharge   Problem: Skin Integrity: Goal: Risk for impaired skin integrity will decrease 06/03/2022 1202 by Myrene Buddy, RN Outcome: Adequate for Discharge 06/03/2022 1200 by Myrene Buddy, RN Outcome: Adequate for Discharge

## 2022-06-03 NOTE — Discharge Instructions (Signed)

## 2022-06-03 NOTE — Discharge Summary (Signed)
Patient ID: Alex Jackson MRN: [id number] DOB/AGE: January 30, 1946 77 y.o.  Admit date: 06/02/2022 Discharge date: 06/03/2022  Admission Diagnoses:  Principal Problem:   Unilateral primary osteoarthritis, right hip Active Problems:   OA (osteoarthritis) of hip   Status post total replacement of right hip   Discharge Diagnoses:  Same  Past Medical History:  Diagnosis Date   CAD in native artery 03/01/2015   1. cath - DES to LAD residual non obstructive RCA disease in 2016 b. Cath s/p Successful PCI with stenting of the distal RCA into the PLOM with a DES. Balloon angioplasty of the PDA through the stent struts.    Diabetes mellitus without complication ([place])    Edema of both lower extremities    Hyperlipidemia    Hypertension    Leg weakness    Overweight    Spinal stenosis    Ulcer of great toe [facility])     Surgeries: Procedure(s): RIGHT TOTAL HIP ARTHROPLASTY on 06/02/2022   Consultants:   Discharged Condition: Improved  Hospital Course: Alex Jackson is an 77 y.o. male who was admitted 06/02/2022 for operative treatment ofUnilateral primary osteoarthritis, right hip. Patient has severe unremitting pain that affects sleep, daily activities, and work/hobbies. After pre-op clearance the patient was taken to the operating room on 06/02/2022 and underwent  Procedure(s): RIGHT TOTAL HIP ARTHROPLASTY.    Patient was given perioperative antibiotics:  Anti-infectives (From admission, onward)    Start     Dose/Rate Route Frequency Ordered Stop   06/02/22 1500  vancomycin (VANCOCIN) IVPB 1000 mg/200 mL premix        1,000 mg 200 mL/hr over 60 Minutes Intravenous Every 12 hours 06/02/22 1452 06/02/22 2136   06/02/22 0830  vancomycin (VANCOREADY) IVPB 1500 mg/300 mL        1,500 mg 150 mL/hr over 120 Minutes Intravenous 60 min pre-op 06/01/22 1443 06/02/22 1017   06/02/22 0830  ceFAZolin (ANCEF) IVPB 2g/100 mL premix        2 g 200 mL/hr over 30 Minutes Intravenous On call to O.R.  06/02/22 0741 06/02/22 1015        Patient was given sequential compression devices, early ambulation, and chemoprophylaxis to prevent DVT.  Patient benefited maximally from hospital stay and there were no complications.    Recent vital signs: Patient Vitals for the past 24 hrs:  BP Temp Temp src Pulse Resp SpO2 Height Weight  06/03/22 0440 (!) 119/56 98.1 F (36.7 C) Oral 80 18 98 % -- --  06/02/22 2217 (!) 122/55 98 F (36.7 C) Oral 79 19 99 % -- --  06/02/22 1612 116/71 -- -- -- -- -- -- --  06/02/22 1455 (!) 85/57 97.8 F (36.6 C) Oral 80 16 98 % -- --  06/02/22 1345 104/65 -- -- 63 15 98 % -- --  06/02/22 1342 104/65 -- -- 68 14 99 % -- --  06/02/22 1310 90/72 98 F (36.7 C) -- 66 15 94 % -- --  06/02/22 1255 (!) 93/58 -- -- 70 17 94 % -- --  06/02/22 1240 (!) 97/52 -- -- 70 15 97 % -- --  06/02/22 1225 (!) 93/43 -- -- 71 16 97 % -- --  06/02/22 1210 97/62 -- -- 64 (!) 21 95 % -- --  06/02/22 1150 (!) 89/59 -- -- 70 18 95 % -- --  06/02/22 1145 (!) 103/93 -- -- 74 20 96 % -- --  06/02/22 1135 98/60 (!) 97.5 F (36.4 C) --  81 13 95 % -- --  06/02/22 0756 130/82 98.1 F (36.7 C) Oral 81 16 97 % 5\' 11"  (1.803 m) 107 kg     Recent laboratory studies:  Recent Labs    06/03/22 0140  WBC 12.5*  HGB 11.4*  HCT 33.6*  PLT 219  NA 134*  K 3.5  CL 101  CO2 23  BUN 20  CREATININE 1.34*  GLUCOSE 124*  CALCIUM 8.8*     Discharge Medications:   Allergies as of 06/03/2022       Reactions   Prednisone Anxiety, Other (See Comments)   Makes him crazy   Atorvastatin Other (See Comments)   Pt reports "causes lower extremity muscle aches."   Pravastatin Other (See Comments)   Pt reports "causes bilateral calf cramping."        Medication List     STOP taking these medications    aspirin EC 81 MG tablet Replaced by: aspirin 81 MG chewable tablet       TAKE these medications    aspirin 81 MG chewable tablet Chew 1 tablet (81 mg total) by mouth 2 (two)  times daily. Replaces: aspirin EC 81 MG tablet   bisoprolol 10 MG tablet Commonly known as: ZEBETA TAKE ONE TABLET BY MOUTH EVERY MORNING   chlorthalidone 25 MG tablet Commonly known as: HYGROTON TAKE ONE TABLET BY MOUTH ONCE DAILY   fluticasone 50 MCG/ACT nasal spray Commonly known as: FLONASE Place 1 spray into both nostrils daily as needed for allergies.   furosemide 40 MG tablet Commonly known as: LASIX Take 1 tablet (40 mg total) by mouth daily as needed for fluid or edema.   Gemtesa 75 MG Tabs Generic drug: Vibegron Take 75 mg by mouth in the morning.   lisinopril 40 MG tablet Commonly known as: ZESTRIL TAKE ONE TABLET BY MOUTH ONCE DAILY   methocarbamol 500 MG tablet Commonly known as: ROBAXIN Take 1 tablet (500 mg total) by mouth every 6 (six) hours as needed for muscle spasms.   multivitamin with minerals Tabs tablet Take 1 tablet by mouth in the morning. Men's 50+ Complete Multivitamin   naproxen sodium 220 MG tablet Commonly known as: ALEVE Take 440 mg by mouth daily as needed (pain.).   Ozempic (1 MG/DOSE) 4 MG/3ML Sopn Generic drug: Semaglutide (1 MG/DOSE) Inject 1 mg into the skin once a week. What changed: when to take this   pantoprazole 40 MG tablet Commonly known as: PROTONIX TAKE ONE TABLET BY MOUTH ONCE DAILY   rosuvastatin 10 MG tablet Commonly known as: CRESTOR Take 1 tablet (10 mg total) by mouth daily.   tamsulosin 0.4 MG Caps capsule Commonly known as: FLOMAX Take 0.4 mg by mouth in the morning.   traMADol 50 MG tablet Commonly known as: ULTRAM Take 1-2 tablets (50-100 mg total) by mouth every 6 (six) hours as needed.               Durable Medical Equipment  (From admission, onward)           Start     Ordered   06/02/22 1453  DME 3 n 1  Once        06/02/22 1452   06/02/22 1453  DME Walker rolling  Once       Question Answer Comment  Walker: With 5 Inch Wheels   Patient needs a walker to treat with the following  condition Status post total replacement of right hip      06/02/22  64            Diagnostic Studies: DG Pelvis Portable  Result Date: 06/02/2022 CLINICAL DATA:  Status post right hip replacement. EXAM: PORTABLE PELVIS 1-2 VIEWS COMPARISON:  Fluoroscopic images of same day. FINDINGS: Right femoral and acetabular components are well situated. Expected postoperative changes are seen in the surrounding soft tissues. IMPRESSION: Status post right total hip arthroplasty. Electronically Signed   By: Marijo Conception M.D.   On: 06/02/2022 12:24   DG HIP UNILAT WITH PELVIS 1V RIGHT  Result Date: 06/02/2022 CLINICAL DATA:  Right total hip arthroplasty. EXAM: DG HIP (WITH OR WITHOUT PELVIS) 1V RIGHT; DG C-ARM 1-60 MIN-NO REPORT Radiation exposure index: 4.1864 mGy. COMPARISON:  May 12, 2022. FINDINGS: Five intraoperative fluoroscopic images were obtained of the right hip. The right femoral and acetabular components are well situated. IMPRESSION: Fluoroscopic guidance provided during right total hip arthroplasty. Electronically Signed   By: Marijo Conception M.D.   On: 06/02/2022 11:18   DG C-Arm 1-60 Min-No Report  Result Date: 06/02/2022 CLINICAL DATA:  Right total hip arthroplasty. EXAM: DG HIP (WITH OR WITHOUT PELVIS) 1V RIGHT; DG C-ARM 1-60 MIN-NO REPORT Radiation exposure index: 4.1864 mGy. COMPARISON:  May 12, 2022. FINDINGS: Five intraoperative fluoroscopic images were obtained of the right hip. The right femoral and acetabular components are well situated. IMPRESSION: Fluoroscopic guidance provided during right total hip arthroplasty. Electronically Signed   By: Marijo Conception M.D.   On: 06/02/2022 11:18   XR HIP UNILAT W OR W/O PELVIS 2-3 VIEWS RIGHT  Result Date: 05/12/2022 An AP pelvis and lateral of the right hip shows severe end-stage arthritis of the right hip.  There is complete loss of the joint surface and impaction of the femoral head on the acetabulum.  There is severe sclerotic  and cystic changes as well as osteophytes.  The left hip appears normal.   Disposition: Discharge disposition: 01-Home or Self Care          Follow-up Information     Mcarthur Rossetti, MD Follow up in 2 week(s).   Specialty: Orthopedic Surgery Contact information: [address] [place] [address] [phone]                  Signed: Mcarthur Rossetti 06/03/2022, 7:34 AM

## 2022-06-03 NOTE — TOC Transition Note (Signed)
Transition of Care [facility]) - CM/SW Discharge Note   Patient Details  Name: Alex Jackson MRN: [id number] Date of Birth: 07-30-1945  Transition of Care [facility]) CM/SW Contact:  Sharin Mons, RN Phone Number: 06/03/2022, 10:11 AM   Clinical Narrative:    Patient will DC to: home Anticipated DC date: 06/03/2022 Family notified: yes Transport by: car         - s/p RIGHT TOTAL HIP ARTHROPLASTY , 1/30 Per MD patient ready for DC today pending therapy clearance. RN, patient, patient's son, and [place]  notified of DC.   Pt without DME needs,. States has plenty equipment @ home, RW, BSC, cane. States son to assist with care once discharge. Pt without RX med concerns. Post hospital f/u noted on AVS. Son to provide transportation to home.  RNCM will sign off for now as intervention is no longer needed. Please consult [place] again if new needs arise.   Final next level of care: [place] Barriers to Discharge: No Barriers Identified   Patient Goals and CMS Choice      Discharge Placement                         Discharge Plan and Services Additional resources added to the After Visit Summary for                            [facility] Arranged: PT HH Agency: [place] Date [place]: 06/03/22 Time [place]: [phone] Representative spoke with at [place]: [place] Determinants of Health ([place]) Interventions SDOH Screenings   Depression (PHQ2-9): Low Risk  (02/18/2022)  Tobacco Use: Medium Risk (06/03/2022)     Readmission Risk Interventions     No data to display

## 2022-06-04 ENCOUNTER — Telehealth: Payer: Self-pay | Admitting: Orthopaedic Surgery

## 2022-06-04 ENCOUNTER — Telehealth: Payer: Self-pay | Admitting: *Deleted

## 2022-06-04 NOTE — Telephone Encounter (Signed)
I called and advised for pt to call his pcp. He stated he would

## 2022-06-04 NOTE — Telephone Encounter (Signed)
Ortho bundle D/C call today completed.

## 2022-06-04 NOTE — Telephone Encounter (Signed)
Patient states his B/P is low and he read on the internet to take salt tabs and drink H2O, I advised patient to speak to Dr. Adela Ports staff before he self treats..please call

## 2022-06-05 DIAGNOSIS — R262 Difficulty in walking, not elsewhere classified: Secondary | ICD-10-CM | POA: Diagnosis not present

## 2022-06-05 DIAGNOSIS — M25651 Stiffness of right hip, not elsewhere classified: Secondary | ICD-10-CM | POA: Diagnosis not present

## 2022-06-05 DIAGNOSIS — Z96641 Presence of right artificial hip joint: Secondary | ICD-10-CM | POA: Diagnosis not present

## 2022-06-05 DIAGNOSIS — M25551 Pain in right hip: Secondary | ICD-10-CM | POA: Diagnosis not present

## 2022-06-08 DIAGNOSIS — M25651 Stiffness of right hip, not elsewhere classified: Secondary | ICD-10-CM | POA: Diagnosis not present

## 2022-06-08 DIAGNOSIS — R262 Difficulty in walking, not elsewhere classified: Secondary | ICD-10-CM | POA: Diagnosis not present

## 2022-06-08 DIAGNOSIS — M5431 Sciatica, right side: Secondary | ICD-10-CM | POA: Diagnosis not present

## 2022-06-08 DIAGNOSIS — Z96641 Presence of right artificial hip joint: Secondary | ICD-10-CM | POA: Diagnosis not present

## 2022-06-08 DIAGNOSIS — M25551 Pain in right hip: Secondary | ICD-10-CM | POA: Diagnosis not present

## 2022-06-08 DIAGNOSIS — M9903 Segmental and somatic dysfunction of lumbar region: Secondary | ICD-10-CM | POA: Diagnosis not present

## 2022-06-09 ENCOUNTER — Telehealth: Payer: Self-pay | Admitting: *Deleted

## 2022-06-09 NOTE — Telephone Encounter (Signed)
Ortho bundle 7 day call completed.

## 2022-06-10 DIAGNOSIS — M25551 Pain in right hip: Secondary | ICD-10-CM | POA: Diagnosis not present

## 2022-06-10 DIAGNOSIS — Z96641 Presence of right artificial hip joint: Secondary | ICD-10-CM | POA: Diagnosis not present

## 2022-06-10 DIAGNOSIS — R262 Difficulty in walking, not elsewhere classified: Secondary | ICD-10-CM | POA: Diagnosis not present

## 2022-06-10 DIAGNOSIS — M25651 Stiffness of right hip, not elsewhere classified: Secondary | ICD-10-CM | POA: Diagnosis not present

## 2022-06-12 DIAGNOSIS — Z96641 Presence of right artificial hip joint: Secondary | ICD-10-CM | POA: Diagnosis not present

## 2022-06-12 DIAGNOSIS — R262 Difficulty in walking, not elsewhere classified: Secondary | ICD-10-CM | POA: Diagnosis not present

## 2022-06-12 DIAGNOSIS — M25551 Pain in right hip: Secondary | ICD-10-CM | POA: Diagnosis not present

## 2022-06-12 DIAGNOSIS — M25651 Stiffness of right hip, not elsewhere classified: Secondary | ICD-10-CM | POA: Diagnosis not present

## 2022-06-15 DIAGNOSIS — Z96641 Presence of right artificial hip joint: Secondary | ICD-10-CM | POA: Diagnosis not present

## 2022-06-15 DIAGNOSIS — R262 Difficulty in walking, not elsewhere classified: Secondary | ICD-10-CM | POA: Diagnosis not present

## 2022-06-15 DIAGNOSIS — M25551 Pain in right hip: Secondary | ICD-10-CM | POA: Diagnosis not present

## 2022-06-15 DIAGNOSIS — M25651 Stiffness of right hip, not elsewhere classified: Secondary | ICD-10-CM | POA: Diagnosis not present

## 2022-06-16 ENCOUNTER — Ambulatory Visit: Payer: PPO | Admitting: Podiatry

## 2022-06-16 ENCOUNTER — Encounter: Payer: Self-pay | Admitting: Orthopaedic Surgery

## 2022-06-16 ENCOUNTER — Ambulatory Visit (INDEPENDENT_AMBULATORY_CARE_PROVIDER_SITE_OTHER): Payer: PPO | Admitting: Orthopaedic Surgery

## 2022-06-16 DIAGNOSIS — M5431 Sciatica, right side: Secondary | ICD-10-CM | POA: Diagnosis not present

## 2022-06-16 DIAGNOSIS — Z96641 Presence of right artificial hip joint: Secondary | ICD-10-CM

## 2022-06-16 DIAGNOSIS — M9903 Segmental and somatic dysfunction of lumbar region: Secondary | ICD-10-CM | POA: Diagnosis not present

## 2022-06-16 NOTE — Progress Notes (Signed)
HPI: Mr. Sharum returns today status post right total hip arthroplasty 06/02/2022.  He states he is overall doing well.  Denies any pain in the hip whatsoever.  He states he did have a pop in his hip few days ago when he had some pain that was short-lived.  Now no longer having any pain.  He uses a cane to ambulate.  Taking no pain medicine no muscle relaxants.  He is on 81 mg aspirin twice daily was on aspirin once daily prior to surgery.  Denies fevers chills.   Physical exam: Right hip surgical incisions well-approximated staples no signs of infection or dehiscence.  Slight seroma.  Right calf is supple and nontender.  Dorsiflexion plantarflexion right ankle intact.  Good range of motion right hip without pain.  Impression: Status post right total hip arthroplasty  Plan: Recommend he go back on his 81 mg aspirin.  He will work on scar tissue mobilization.  Staples were harvested Steri-Strips applied.  He will continue to work on range of motion strengthening the hip.  Will see him back in 1 month sooner if there is any questions or concerns.  Questions were encouraged and answered by Dr. Delilah Shan myself

## 2022-06-17 ENCOUNTER — Telehealth: Payer: Self-pay | Admitting: *Deleted

## 2022-06-17 DIAGNOSIS — Z96641 Presence of right artificial hip joint: Secondary | ICD-10-CM | POA: Diagnosis not present

## 2022-06-17 DIAGNOSIS — R262 Difficulty in walking, not elsewhere classified: Secondary | ICD-10-CM | POA: Diagnosis not present

## 2022-06-17 DIAGNOSIS — M25651 Stiffness of right hip, not elsewhere classified: Secondary | ICD-10-CM | POA: Diagnosis not present

## 2022-06-17 DIAGNOSIS — M25551 Pain in right hip: Secondary | ICD-10-CM | POA: Diagnosis not present

## 2022-06-17 NOTE — Telephone Encounter (Signed)
Ortho bundle 14 day in office visit completed on 06/16/22.

## 2022-06-18 ENCOUNTER — Ambulatory Visit (INDEPENDENT_AMBULATORY_CARE_PROVIDER_SITE_OTHER): Payer: PPO | Admitting: Podiatry

## 2022-06-18 ENCOUNTER — Ambulatory Visit: Payer: PPO

## 2022-06-18 DIAGNOSIS — L97529 Non-pressure chronic ulcer of other part of left foot with unspecified severity: Secondary | ICD-10-CM

## 2022-06-18 DIAGNOSIS — L97501 Non-pressure chronic ulcer of other part of unspecified foot limited to breakdown of skin: Secondary | ICD-10-CM | POA: Diagnosis not present

## 2022-06-18 MED ORDER — DOXYCYCLINE HYCLATE 100 MG PO TABS: 100.0000 mg | ORAL_TABLET | Freq: Two times a day (BID) | ORAL | 0 refills | Status: DC

## 2022-06-18 NOTE — Progress Notes (Signed)
Subjective: Chief Complaint  Patient presents with   Foot Ulcer    Left foot, great hallux     77 year old male presents the office to the above concerns.  He been using the collagen, blast X.  He wears a foot Conservation officer, nature at times.  Not wearing it today.  Denies any fevers or chills.    He recently had a hip replacement so he is walking better now.    Objective: AAO x3, NAD DP/PT pulses palpable bilaterally, CRT less than 3 seconds Plantar aspect left hallux is a granular wound with hyperkeratotic periwound.  After debrided the wound today the wound is larger measuring 1 x 0.8 x 0.2 cm.  There is no probing to bone or tunneling.  There is no drainage or pus today.  There is no fluctuance or crepitation.  No malodor.  Patient hallux malleus is present. No pain with calf compression, swelling, warmth, erythema   Assessment: 77 year old male chronic ulceration left hallux  Plan: -All treatment options discussed with the patient including all alternatives, risks, complications.  -X-rays were obtained reviewed.  3 views of the foot were obtained.  No soft tissue edema.  Significant hallux malleus is present.  No definitive cortical changes such as abscess osteomyelitis at this time. -Medically necessary wound debridement was performed today.  Sharply debrided the wound today utilizing #312 with scalpel down to healthy, granular tissue in order promote wound healing.  There is no blood loss.  Cleaned the wound with saline.  I then applied collagen, blast X to the wound.  Dry dressing was then applied.  I showed him how to change the dressings he is in continue with daily dressing changes.  Continue offloading.  -Foot defender to help offload.  Recommend offloading at all times -Monitor for any clinical signs or symptoms of infection and directed to call the office immediately should any occur or go to the ER. -Patient encouraged to call the office with any questions, concerns, change in  symptoms.   Trula Slade DPM

## 2022-06-19 ENCOUNTER — Telehealth: Payer: Self-pay | Admitting: Orthopaedic Surgery

## 2022-06-19 NOTE — Telephone Encounter (Signed)
Patient states he need to speak to [facility]

## 2022-06-22 ENCOUNTER — Ambulatory Visit: Payer: PPO | Admitting: Podiatry

## 2022-06-22 DIAGNOSIS — M25551 Pain in right hip: Secondary | ICD-10-CM | POA: Diagnosis not present

## 2022-06-22 DIAGNOSIS — M25651 Stiffness of right hip, not elsewhere classified: Secondary | ICD-10-CM | POA: Diagnosis not present

## 2022-06-22 DIAGNOSIS — R262 Difficulty in walking, not elsewhere classified: Secondary | ICD-10-CM | POA: Diagnosis not present

## 2022-06-22 DIAGNOSIS — Z96641 Presence of right artificial hip joint: Secondary | ICD-10-CM | POA: Diagnosis not present

## 2022-06-23 ENCOUNTER — Other Ambulatory Visit: Payer: Self-pay

## 2022-06-23 ENCOUNTER — Ambulatory Visit (INDEPENDENT_AMBULATORY_CARE_PROVIDER_SITE_OTHER): Payer: PPO | Admitting: Internal Medicine

## 2022-06-23 VITALS — BP 114/73 | HR 93 | Temp 98.8°F | Wt 236.0 lb

## 2022-06-23 DIAGNOSIS — L97521 Non-pressure chronic ulcer of other part of left foot limited to breakdown of skin: Secondary | ICD-10-CM

## 2022-06-23 NOTE — Progress Notes (Addendum)
Alex Jackson for Infectious Disease  Patient Active Problem List   Diagnosis Date Noted   Plantar ulcer of left foot (Alex Jackson) 02/04/2022    Priority: High   Diabetic infection of left foot (Alex Jackson) 10/22/2021    Priority: High   Osteomyelitis of left foot (Alex Jackson) 10/22/2021    Priority: High   OA (osteoarthritis) of hip 06/02/2022   Status post total replacement of right hip 06/02/2022   Unilateral primary osteoarthritis, right hip 05/12/2022   Inflammation of sacroiliac joint (Alex Jackson) 02/27/2022   Lumbar radiculopathy 02/27/2022   Ulcer of left foot due to type 2 diabetes mellitus (Alex Jackson) 10/04/2020   Chronic sinusitis 05/17/2019   Piriformis syndrome of left side 07/23/2017   Coronary artery disease involving native coronary artery of native heart with unstable angina pectoris (Alex Jackson) 02/11/2017   Lumbar spondylosis 02/11/2017   Iliotibial band syndrome of both sides 02/11/2017   H/O hyperglycemia 02/11/2017   Chronic lower back pain 02/11/2017   Cardiomyopathy, ischemic 09/16/2015   SOB (shortness of breath) 09/16/2015   LV dysfunction 09/16/2015   Hyperlipidemia 03/02/2015   Essential hypertension 03/02/2015   S/P drug eluting coronary stent placement 03/02/2015   NSTEMI (non-ST elevated myocardial infarction) (Alex Jackson) 03/01/2015    Patient's Medications  New Prescriptions   No medications on file  Previous Medications   ASPIRIN 81 MG CHEWABLE TABLET    Chew 1 tablet (81 mg total) by mouth 2 (two) times daily.   BISOPROLOL (ZEBETA) 10 MG TABLET    TAKE ONE TABLET BY MOUTH EVERY MORNING   CHLORTHALIDONE (HYGROTON) 25 MG TABLET    TAKE ONE TABLET BY MOUTH ONCE DAILY   FLUTICASONE (FLONASE) 50 MCG/ACT NASAL SPRAY    Place 1 spray into both nostrils daily as needed for allergies.   FUROSEMIDE (LASIX) 40 MG TABLET    Take 1 tablet (40 mg total) by mouth daily as needed for fluid or edema.   GEMTESA 75 MG TABS    Take 75 mg by mouth in the morning.   LISINOPRIL (ZESTRIL) 40 MG  TABLET    TAKE ONE TABLET BY MOUTH ONCE DAILY   MULTIPLE VITAMIN (MULTIVITAMIN WITH MINERALS) TABS TABLET    Take 1 tablet by mouth in the morning. Men's 50+ Complete Multivitamin   NAPROXEN SODIUM (ALEVE) 220 MG TABLET    Take 440 mg by mouth daily as needed (pain.).   PANTOPRAZOLE (PROTONIX) 40 MG TABLET    TAKE ONE TABLET BY MOUTH ONCE DAILY   ROSUVASTATIN (CRESTOR) 10 MG TABLET    Take 1 tablet (10 mg total) by mouth daily.   SEMAGLUTIDE, 1 MG/DOSE, (OZEMPIC, 1 MG/DOSE,) 4 MG/3ML SOPN    Inject 1 mg into the skin once a week.   TAMSULOSIN (FLOMAX) 0.4 MG CAPS CAPSULE    Take 0.4 mg by mouth in the morning.  Modified Medications   No medications on file  Discontinued Medications   DOXYCYCLINE (VIBRA-TABS) 100 MG TABLET    Take 1 tablet (100 mg total) by mouth 2 (two) times daily.    Subjective: Alex Jackson called and requested a work in visit.  He is a 77 y.o. male with diabetes and peripheral neuropathy who developed a blister on the plantar surface of his left great toe on 10/02/2020.  The blister opened and drained and he has had a plantar ulcer ever since.  He was evaluated by orthopedics last year and amputation was recommended.  He did not like that recommendation.  A wound culture in January grew E. coli and Alcaligenes.  Molecular testing of a wound swab specimen on 09/08/2021 detected Enterococcus, coagulase-negative staph, Enterococcus, Staph aureus and Strep agalactiae.  He has not been on any antibiotics.  An MRI in February revealed:   MRI 06/14/2021 IMPRESSION: 1. Soft tissue ulcer at the tip of the great toe. Bone marrow edema in the distal aspect of the first distal phalanx concerning for osteomyelitis. No drainable fluid collection to suggest an abscess. 2. Bipartite medial hallux sesamoid with marrow edema as can be seen with sesamoiditis.   Full contact casting is being planned.  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  He has had problems with left knee arthritis and right thigh pain.  He is seeing a physical therapist twice weekly.  He lives alone.  When I first met him last July I elected to treat him with oral amoxicillin/clavulanate and trimethoprim/sulfamethoxazole.  He completed 5 weeks of therapy and tolerated his antibiotics well.  His wound started to improve but has stalled over the last 6 months.  He continues to see [name], his podiatrist, for the persistent plantar ulcer.  He last saw him on 06/18/2022.  There was no clinical or radiographic evidence of infection at that time according to [name] note.  However, he was started on oral doxycycline as a precaution.  He was prescribed a new offloading boot that he received recently but he has not started using it.  He also underwent right total hip arthroplasty last month and is recuperating well.  He is having much less low back and right hip pain.  Review of Systems: Review of Systems  Constitutional:  Negative for chills, diaphoresis and fever.  Musculoskeletal:  Negative for joint pain.    Past Medical History:  Diagnosis Date   CAD in native artery 03/01/2015   1. cath - DES to LAD residual non obstructive RCA disease in 2016 b. Cath s/p Successful PCI with stenting of the distal RCA into the PLOM with a DES. Balloon angioplasty of the PDA through the stent struts.    Diabetes mellitus without complication ([facility])    Edema of both lower extremities    Hyperlipidemia    Hypertension    Leg weakness    Overweight    Spinal stenosis    Ulcer of great toe ([facility])     Social History   Tobacco Use   Smoking status: Former    Packs/day: 1.00    Types: Cigarettes    Quit date: 07/03/1983    Years since quitting: 39.0   Smokeless tobacco: Never   Tobacco comments:    quit in Mar 1985  Vaping Use   Vaping Use: Never used  Substance Use Topics   Alcohol use: Yes    Alcohol/week: 0.0 standard drinks  of alcohol    Comment: "one or two drinks a night"   Drug use: No    Family History  Problem Relation Age of Onset   Thyroid cancer Mother    Liver cancer Father        pancreatic   Arrhythmia Sister     Allergies  Allergen Reactions   Prednisone Anxiety and Other (See Comments)    Makes him crazy   Atorvastatin Other (See Comments)    Pt reports "causes lower extremity muscle aches."   Pravastatin Other (See Comments)  Pt reports "causes bilateral calf cramping."    Objective: Vitals:   06/23/22 1344  BP: 114/73  Pulse: 93  Temp: 98.8 F (37.1 C)  TempSrc: Oral  SpO2: 98%  Weight: 236 lb (107 kg)   Body mass index is 32.92 kg/m.  Physical Exam Constitutional:      Comments: He is talkative and in good spirits.  Musculoskeletal:     Comments: His left great toenail is thickened and loose.  He catches it on his sock frequently.  He has a persistent, unchanged plantar ulcer on his left great toe.  There is no drainage or malodor.  He has some bruising at the base of his toe where his dressing has been constricting.  There is no unusual redness or swelling of the toe.  Skin:    Findings: Rash present.     Comments: He has a scattered red macular rash that is nonblanching on his arms and around his ankles that has been present since his recent hip surgery.  Rash is slowly fading.  It does not itch.  Psychiatric:        Mood and Affect: Mood normal.     Lab Results    Problem List Items Addressed This Visit       High   Plantar ulcer of left foot ([place]) - Primary    He has persistent plantar ulcer of his left great toe.  I do not think infection is the culprit at this time and I do not benefit from doxycycline.  I will have him stop it now.  I recommended that he use the new boot to offload pressure from the toe.  He will follow-up with me in 1 month.        Michel Bickers, MD [facility] [place] [facility] [phone] pager    [phone] cell 06/23/2022, 2:16 PM

## 2022-06-23 NOTE — Assessment & Plan Note (Signed)
He has persistent plantar ulcer of his left great toe.  I do not think infection is the culprit at this time and I do not benefit from doxycycline.  I will have him stop it now.  I recommended that he use the new boot to offload pressure from the toe.  He will follow-up with me in 1 month.

## 2022-06-25 ENCOUNTER — Encounter: Payer: PPO | Admitting: Orthopaedic Surgery

## 2022-06-27 ENCOUNTER — Other Ambulatory Visit (HOSPITAL_BASED_OUTPATIENT_CLINIC_OR_DEPARTMENT_OTHER): Payer: Self-pay

## 2022-06-27 ENCOUNTER — Other Ambulatory Visit: Payer: Self-pay

## 2022-06-29 ENCOUNTER — Encounter: Payer: Self-pay | Admitting: Orthopaedic Surgery

## 2022-06-29 ENCOUNTER — Other Ambulatory Visit (HOSPITAL_BASED_OUTPATIENT_CLINIC_OR_DEPARTMENT_OTHER): Payer: Self-pay

## 2022-06-29 ENCOUNTER — Other Ambulatory Visit: Payer: Self-pay | Admitting: *Deleted

## 2022-06-29 ENCOUNTER — Ambulatory Visit (INDEPENDENT_AMBULATORY_CARE_PROVIDER_SITE_OTHER): Payer: PPO | Admitting: Orthopaedic Surgery

## 2022-06-29 DIAGNOSIS — M5431 Sciatica, right side: Secondary | ICD-10-CM | POA: Diagnosis not present

## 2022-06-29 DIAGNOSIS — M9903 Segmental and somatic dysfunction of lumbar region: Secondary | ICD-10-CM | POA: Diagnosis not present

## 2022-06-29 DIAGNOSIS — R262 Difficulty in walking, not elsewhere classified: Secondary | ICD-10-CM | POA: Diagnosis not present

## 2022-06-29 DIAGNOSIS — Z96641 Presence of right artificial hip joint: Secondary | ICD-10-CM | POA: Diagnosis not present

## 2022-06-29 DIAGNOSIS — M25651 Stiffness of right hip, not elsewhere classified: Secondary | ICD-10-CM | POA: Diagnosis not present

## 2022-06-29 DIAGNOSIS — E119 Type 2 diabetes mellitus without complications: Secondary | ICD-10-CM

## 2022-06-29 DIAGNOSIS — M25551 Pain in right hip: Secondary | ICD-10-CM | POA: Diagnosis not present

## 2022-06-29 MED ORDER — OZEMPIC (1 MG/DOSE) 4 MG/3ML ~~LOC~~ SOPN: 1.0000 mg | PEN_INJECTOR | SUBCUTANEOUS | 2 refills | Status: DC

## 2022-06-29 NOTE — Progress Notes (Signed)
The patient comes in early to have [place] take a look at his right hip incision.  He had a hip replacement 3 weeks ago.  Since then he has had a rash on both his arms but he did start doxycycline for a toe issue and then stopped it.  He is not taking medications right now in terms of any pain medicines.  His right hip incision x-ray looks good except for small area of dehiscence at the very top in the groin crease.  There is does not appear infected and is not worrisome.  I instructed him to clean this area daily during a shower and then to place Bactroban ointment on the wound and a Band-Aid.  This will heal with time.  He will keep his regular follow-up with [place] on March 20 but no x-rays are needed.

## 2022-07-06 DIAGNOSIS — M25551 Pain in right hip: Secondary | ICD-10-CM | POA: Diagnosis not present

## 2022-07-06 DIAGNOSIS — Z96641 Presence of right artificial hip joint: Secondary | ICD-10-CM | POA: Diagnosis not present

## 2022-07-06 DIAGNOSIS — M25651 Stiffness of right hip, not elsewhere classified: Secondary | ICD-10-CM | POA: Diagnosis not present

## 2022-07-06 DIAGNOSIS — M5431 Sciatica, right side: Secondary | ICD-10-CM | POA: Diagnosis not present

## 2022-07-06 DIAGNOSIS — M9903 Segmental and somatic dysfunction of lumbar region: Secondary | ICD-10-CM | POA: Diagnosis not present

## 2022-07-06 DIAGNOSIS — R2689 Other abnormalities of gait and mobility: Secondary | ICD-10-CM | POA: Diagnosis not present

## 2022-07-09 ENCOUNTER — Ambulatory Visit: Payer: PPO | Admitting: Podiatry

## 2022-07-09 ENCOUNTER — Encounter: Payer: Self-pay | Admitting: Radiology

## 2022-07-13 DIAGNOSIS — Z96641 Presence of right artificial hip joint: Secondary | ICD-10-CM | POA: Diagnosis not present

## 2022-07-13 DIAGNOSIS — M25551 Pain in right hip: Secondary | ICD-10-CM | POA: Diagnosis not present

## 2022-07-13 DIAGNOSIS — M9903 Segmental and somatic dysfunction of lumbar region: Secondary | ICD-10-CM | POA: Diagnosis not present

## 2022-07-13 DIAGNOSIS — R262 Difficulty in walking, not elsewhere classified: Secondary | ICD-10-CM | POA: Diagnosis not present

## 2022-07-13 DIAGNOSIS — M5431 Sciatica, right side: Secondary | ICD-10-CM | POA: Diagnosis not present

## 2022-07-13 DIAGNOSIS — M25651 Stiffness of right hip, not elsewhere classified: Secondary | ICD-10-CM | POA: Diagnosis not present

## 2022-07-17 DIAGNOSIS — R262 Difficulty in walking, not elsewhere classified: Secondary | ICD-10-CM | POA: Diagnosis not present

## 2022-07-17 DIAGNOSIS — M25651 Stiffness of right hip, not elsewhere classified: Secondary | ICD-10-CM | POA: Diagnosis not present

## 2022-07-17 DIAGNOSIS — M25551 Pain in right hip: Secondary | ICD-10-CM | POA: Diagnosis not present

## 2022-07-17 DIAGNOSIS — Z96641 Presence of right artificial hip joint: Secondary | ICD-10-CM | POA: Diagnosis not present

## 2022-07-22 ENCOUNTER — Ambulatory Visit (INDEPENDENT_AMBULATORY_CARE_PROVIDER_SITE_OTHER): Payer: PPO | Admitting: Internal Medicine

## 2022-07-22 ENCOUNTER — Ambulatory Visit (INDEPENDENT_AMBULATORY_CARE_PROVIDER_SITE_OTHER): Payer: PPO | Admitting: Orthopaedic Surgery

## 2022-07-22 ENCOUNTER — Other Ambulatory Visit: Payer: Self-pay

## 2022-07-22 ENCOUNTER — Encounter: Payer: Self-pay | Admitting: Orthopaedic Surgery

## 2022-07-22 ENCOUNTER — Telehealth: Payer: Self-pay | Admitting: *Deleted

## 2022-07-22 DIAGNOSIS — Z96641 Presence of right artificial hip joint: Secondary | ICD-10-CM

## 2022-07-22 DIAGNOSIS — L97521 Non-pressure chronic ulcer of other part of left foot limited to breakdown of skin: Secondary | ICD-10-CM | POA: Diagnosis not present

## 2022-07-22 DIAGNOSIS — M5431 Sciatica, right side: Secondary | ICD-10-CM | POA: Diagnosis not present

## 2022-07-22 DIAGNOSIS — M9903 Segmental and somatic dysfunction of lumbar region: Secondary | ICD-10-CM | POA: Diagnosis not present

## 2022-07-22 MED ORDER — GABAPENTIN 300 MG PO CAPS: 300.0000 mg | ORAL_CAPSULE | Freq: Every day | ORAL | 1 refills | Status: DC

## 2022-07-22 MED ORDER — AZITHROMYCIN 250 MG PO TABS: ORAL_TABLET | ORAL | 0 refills | Status: DC

## 2022-07-22 NOTE — Progress Notes (Signed)
HPI: Mr. Thelen comes in today for follow-up of his right total hip arthroplasty 06/02/2022.  He states the right hip is doing well.  He still having some hamstring soreness and some radicular symptoms down the left leg.  He states that he has had similar symptoms like this in the past and is needing adjustment by his chiropractor.  He has been hesitant to go to the chiropractor since having a right total hip performed.  States the right hip incisions healed well.  He has no concerns in regards to the right hip.  He denies any saddle anesthesia like symptoms, bowel or bladder dysfunction or waking pain due to his back and left leg pain.  He has been taking naproxen only for pain this is predominantly due to the left leg radicular pain that he feels is getting better with therapy.  Physical exam: General exquisitely tight hamstrings bilaterally negative straight leg raise bilaterally.  Good range of motion both hips without pain.  Right hip incisions are well-healed no signs of infection.  Proximal wound is well-healed no drainage.  Impression: Status post right total hip arthroplasty 06/02/2022 Left leg radiculopathy  Plan: Will place him on Neurontin 300 mg at night.  Continue therapy for his back.  He is able to go to the chiropractor and having adjustment at this point in time.  Regards to the hip he will keep working on range of motion and strengthening.  He does ask about a sinus infection that he has ongoing denies any fevers or chills.  He on exam has tenderness over the left frontal sinus and over the left ethmoid sinus.  Therefore he is placed on a Z-Pak.  He will follow-up with his primary care doctor if his symptoms from the sinus continue or become worse.  He does state that he tried to get in with his primary care physician but cannot get an in a timely manner.

## 2022-07-22 NOTE — Telephone Encounter (Signed)
Ortho bundle in office meeting completed. 

## 2022-07-22 NOTE — Progress Notes (Signed)
Virtual Visit via Video Note  I connected with Alex Jackson on 07/22/22 at 10:45 AM EDT by a video enabled telemedicine application and verified that I am speaking with the correct person using two identifiers.  Location: Patient: Home Provider: [facility]   I discussed the limitations of evaluation and management by telemedicine and the availability of in person appointments. The patient expressed understanding and agreed to proceed.  History of Present Illness: I called and spoke with Alex Jackson today.  He has been struggling with a very slow healing plantar ulcer on his left great toe.  He recently underwent left hip replacement.  His orthopedic surgeon, Dr. Ninfa Linden, prescribed mupirocin ointment for 1 little spot of the incision that was slow to heal.  On his own, Alex Jackson decided to try the ointment on his toe and says that he has had much improvement over the past few weeks.   Observations/Objective:   Assessment and Plan: I treated Alex Jackson for left great toe osteomyelitis last year and it has resolved.  He reports improved healing of his chronic plantar ulcer.  Follow Up Instructions: Follow-up here as needed   I discussed the assessment and treatment plan with the patient. The patient was provided an opportunity to ask questions and all were answered. The patient agreed with the plan and demonstrated an understanding of the instructions.   The patient was advised to call back or seek an in-person evaluation if the symptoms worsen or if the condition fails to improve as anticipated.  I provided 14 minutes of non-face-to-face time during this encounter.   Michel Bickers, MD

## 2022-07-23 ENCOUNTER — Telehealth: Payer: PPO | Admitting: Internal Medicine

## 2022-07-27 DIAGNOSIS — M5431 Sciatica, right side: Secondary | ICD-10-CM | POA: Diagnosis not present

## 2022-07-27 DIAGNOSIS — M9903 Segmental and somatic dysfunction of lumbar region: Secondary | ICD-10-CM | POA: Diagnosis not present

## 2022-07-31 DIAGNOSIS — M25551 Pain in right hip: Secondary | ICD-10-CM | POA: Diagnosis not present

## 2022-07-31 DIAGNOSIS — Z96641 Presence of right artificial hip joint: Secondary | ICD-10-CM | POA: Diagnosis not present

## 2022-07-31 DIAGNOSIS — R262 Difficulty in walking, not elsewhere classified: Secondary | ICD-10-CM | POA: Diagnosis not present

## 2022-07-31 DIAGNOSIS — M25651 Stiffness of right hip, not elsewhere classified: Secondary | ICD-10-CM | POA: Diagnosis not present

## 2022-08-03 DIAGNOSIS — M25651 Stiffness of right hip, not elsewhere classified: Secondary | ICD-10-CM | POA: Diagnosis not present

## 2022-08-03 DIAGNOSIS — R262 Difficulty in walking, not elsewhere classified: Secondary | ICD-10-CM | POA: Diagnosis not present

## 2022-08-03 DIAGNOSIS — Z96641 Presence of right artificial hip joint: Secondary | ICD-10-CM | POA: Diagnosis not present

## 2022-08-03 DIAGNOSIS — M25551 Pain in right hip: Secondary | ICD-10-CM | POA: Diagnosis not present

## 2022-08-10 DIAGNOSIS — M25651 Stiffness of right hip, not elsewhere classified: Secondary | ICD-10-CM | POA: Diagnosis not present

## 2022-08-10 DIAGNOSIS — M25551 Pain in right hip: Secondary | ICD-10-CM | POA: Diagnosis not present

## 2022-08-10 DIAGNOSIS — Z96641 Presence of right artificial hip joint: Secondary | ICD-10-CM | POA: Diagnosis not present

## 2022-08-10 DIAGNOSIS — M5431 Sciatica, right side: Secondary | ICD-10-CM | POA: Diagnosis not present

## 2022-08-10 DIAGNOSIS — R262 Difficulty in walking, not elsewhere classified: Secondary | ICD-10-CM | POA: Diagnosis not present

## 2022-08-10 DIAGNOSIS — M9903 Segmental and somatic dysfunction of lumbar region: Secondary | ICD-10-CM | POA: Diagnosis not present

## 2022-08-13 DIAGNOSIS — M25551 Pain in right hip: Secondary | ICD-10-CM | POA: Diagnosis not present

## 2022-08-13 DIAGNOSIS — R262 Difficulty in walking, not elsewhere classified: Secondary | ICD-10-CM | POA: Diagnosis not present

## 2022-08-13 DIAGNOSIS — Z96641 Presence of right artificial hip joint: Secondary | ICD-10-CM | POA: Diagnosis not present

## 2022-08-13 DIAGNOSIS — M25651 Stiffness of right hip, not elsewhere classified: Secondary | ICD-10-CM | POA: Diagnosis not present

## 2022-08-17 DIAGNOSIS — R262 Difficulty in walking, not elsewhere classified: Secondary | ICD-10-CM | POA: Diagnosis not present

## 2022-08-17 DIAGNOSIS — M9903 Segmental and somatic dysfunction of lumbar region: Secondary | ICD-10-CM | POA: Diagnosis not present

## 2022-08-17 DIAGNOSIS — M25651 Stiffness of right hip, not elsewhere classified: Secondary | ICD-10-CM | POA: Diagnosis not present

## 2022-08-17 DIAGNOSIS — M5431 Sciatica, right side: Secondary | ICD-10-CM | POA: Diagnosis not present

## 2022-08-17 DIAGNOSIS — M25551 Pain in right hip: Secondary | ICD-10-CM | POA: Diagnosis not present

## 2022-08-17 DIAGNOSIS — Z96641 Presence of right artificial hip joint: Secondary | ICD-10-CM | POA: Diagnosis not present

## 2022-08-19 ENCOUNTER — Ambulatory Visit (INDEPENDENT_AMBULATORY_CARE_PROVIDER_SITE_OTHER): Payer: PPO | Admitting: Orthopaedic Surgery

## 2022-08-19 ENCOUNTER — Encounter: Payer: Self-pay | Admitting: Orthopaedic Surgery

## 2022-08-19 DIAGNOSIS — Z96641 Presence of right artificial hip joint: Secondary | ICD-10-CM | POA: Diagnosis not present

## 2022-08-19 DIAGNOSIS — M5416 Radiculopathy, lumbar region: Secondary | ICD-10-CM

## 2022-08-19 DIAGNOSIS — R262 Difficulty in walking, not elsewhere classified: Secondary | ICD-10-CM | POA: Diagnosis not present

## 2022-08-19 DIAGNOSIS — M25651 Stiffness of right hip, not elsewhere classified: Secondary | ICD-10-CM | POA: Diagnosis not present

## 2022-08-19 DIAGNOSIS — M25551 Pain in right hip: Secondary | ICD-10-CM | POA: Diagnosis not present

## 2022-08-19 NOTE — Progress Notes (Signed)
HPI: Mr. Nunziata returns today status post right total hip arthroplasty 06/02/2022.  He states that hip is doing well.  His main complaint is radicular symptoms down his left leg.  He has had no injury to the back.  States that his left leg has had radicular symptoms since undergoing a right total hip arthroplasty.  He denies any bowel or bladder dysfunction, unintentional weight loss, saddle anesthesia, waking pain fevers or chills.  Pain is down the left leg to the ankle.  He has found that a patella strap the proximal tib-fib does not afford him some relief.  He is also tried Aleve and Neurontin without any significant relief.  Send physical therapy for tight hamstrings and back exercises at this point in time unsure if this is helping.  History of MRI lumbar spine 05/16/2021.  At that time he was having right radicular symptoms.  MRI did show at that time a left facet synovial cyst at L4-5 that was felt to be compressing the left L4 nerve root as it exits the foramina.  Also moderate canal stenosis at L3-4.  Review of systems: See HPI otherwise negative  Physical exam: General well-developed well-nourished male who ambulates without assistive device.  Nonantalgic gait. Bilateral hips: Good range of motion both hips without pain.  No tenderness over the trochanteric region of either hip. Lower extremities: 5 out of 5 strength throughout lower extremities against resistance.  Negative straight leg raise bilaterally.  Exquisitely tight hamstrings bilaterally.  He has limited lection of the lumbar spine secondary to tight hamstrings.  Impression: Status post right total hip arthroplasty 06/02/2022 Left lumbar radiculopathy  Plan: Regards to his right hip he is activities as tolerated.  He will follow-up with [place] at 1 year postop sooner if there is any questions concerns.  In regards to the back syndrome per his request back to Dr. Karin Golden for possible left elbow L4-L5 facet injection.  He will follow-up with  [place] 2 weeks after the injection to see what type results he has.  Questions were encouraged and answered at length.

## 2022-08-24 ENCOUNTER — Other Ambulatory Visit: Payer: Self-pay | Admitting: Physician Assistant

## 2022-08-24 DIAGNOSIS — R262 Difficulty in walking, not elsewhere classified: Secondary | ICD-10-CM | POA: Diagnosis not present

## 2022-08-24 DIAGNOSIS — M25651 Stiffness of right hip, not elsewhere classified: Secondary | ICD-10-CM | POA: Diagnosis not present

## 2022-08-24 DIAGNOSIS — M7542 Impingement syndrome of left shoulder: Secondary | ICD-10-CM | POA: Diagnosis not present

## 2022-08-24 DIAGNOSIS — Z96641 Presence of right artificial hip joint: Secondary | ICD-10-CM | POA: Diagnosis not present

## 2022-08-24 DIAGNOSIS — M40292 Other kyphosis, cervical region: Secondary | ICD-10-CM | POA: Diagnosis not present

## 2022-08-24 DIAGNOSIS — M5431 Sciatica, right side: Secondary | ICD-10-CM | POA: Diagnosis not present

## 2022-08-24 DIAGNOSIS — M25551 Pain in right hip: Secondary | ICD-10-CM | POA: Diagnosis not present

## 2022-08-24 DIAGNOSIS — M7541 Impingement syndrome of right shoulder: Secondary | ICD-10-CM | POA: Diagnosis not present

## 2022-08-24 DIAGNOSIS — M5416 Radiculopathy, lumbar region: Secondary | ICD-10-CM

## 2022-08-24 DIAGNOSIS — M9903 Segmental and somatic dysfunction of lumbar region: Secondary | ICD-10-CM | POA: Diagnosis not present

## 2022-08-25 ENCOUNTER — Telehealth: Payer: Self-pay | Admitting: Cardiology

## 2022-08-25 ENCOUNTER — Other Ambulatory Visit (HOSPITAL_COMMUNITY): Payer: Self-pay

## 2022-08-25 DIAGNOSIS — J309 Allergic rhinitis, unspecified: Secondary | ICD-10-CM | POA: Diagnosis not present

## 2022-08-25 DIAGNOSIS — E785 Hyperlipidemia, unspecified: Secondary | ICD-10-CM | POA: Diagnosis not present

## 2022-08-25 DIAGNOSIS — I10 Essential (primary) hypertension: Secondary | ICD-10-CM | POA: Diagnosis not present

## 2022-08-25 DIAGNOSIS — M5430 Sciatica, unspecified side: Secondary | ICD-10-CM | POA: Diagnosis not present

## 2022-08-25 DIAGNOSIS — Z5181 Encounter for therapeutic drug level monitoring: Secondary | ICD-10-CM | POA: Diagnosis not present

## 2022-08-25 DIAGNOSIS — E11621 Type 2 diabetes mellitus with foot ulcer: Secondary | ICD-10-CM | POA: Diagnosis not present

## 2022-08-25 DIAGNOSIS — R972 Elevated prostate specific antigen [PSA]: Secondary | ICD-10-CM | POA: Diagnosis not present

## 2022-08-25 DIAGNOSIS — L97509 Non-pressure chronic ulcer of other part of unspecified foot with unspecified severity: Secondary | ICD-10-CM | POA: Diagnosis not present

## 2022-08-25 DIAGNOSIS — E1169 Type 2 diabetes mellitus with other specified complication: Secondary | ICD-10-CM | POA: Diagnosis not present

## 2022-08-25 NOTE — Telephone Encounter (Signed)
He can't get Ozempic because he's not diabetic.  He can try and get [facility] covered.  (Same medication, different name).  It is now approved to reduce CV events in patients with established CV disease and obesity/overweight.   It's a new indication and not all Medicare plans are covering the medication yet.  Would have to try PA and see if approved and what copay would be.

## 2022-08-25 NOTE — Telephone Encounter (Signed)
Spoke with the patient and explained Pharmacist recommendation:  He can't get Ozempic because he's not diabetic. He can try and get [facility] covered. (Same medication, different name). It is now approved to reduce CV events in patients with established CV disease and obesity/overweight. It's a new indication and not all Medicare plans are covering the medication yet. Would have to try PA and see if approved and what copay would be.   Patient stated the cost of Ozempic is putting him in a "donut hole". He is willing try another medication. Will forward to MD and nurse for advise.

## 2022-08-25 NOTE — Telephone Encounter (Signed)
Pt c/o medication issue:  1. Name of Medication:  Ozempic   2. How are you currently taking this medication (dosage and times per day)?   3. Are you having a reaction (difficulty breathing--STAT)?   4. What is your medication issue?   Patient states this medication will cost $500/month. He would like to discuss alternate option.

## 2022-08-26 NOTE — Telephone Encounter (Signed)
Pt made aware of Megan Supple PharmD's response about GLPs being branded and as indicated in this message.  Pt states he still is going to continue with this regimen despite being in the doughnut hole.  Will make PharmD aware.

## 2022-08-26 NOTE — Telephone Encounter (Signed)
All GLPs are branded, if Ozempic is cost prohibitive and putting him in the donut hole, do not have cheaper alternatives within the same med class unfortunately.

## 2022-08-28 DIAGNOSIS — R262 Difficulty in walking, not elsewhere classified: Secondary | ICD-10-CM | POA: Diagnosis not present

## 2022-08-28 DIAGNOSIS — M25551 Pain in right hip: Secondary | ICD-10-CM | POA: Diagnosis not present

## 2022-08-28 DIAGNOSIS — Z96641 Presence of right artificial hip joint: Secondary | ICD-10-CM | POA: Diagnosis not present

## 2022-08-28 DIAGNOSIS — M25651 Stiffness of right hip, not elsewhere classified: Secondary | ICD-10-CM | POA: Diagnosis not present

## 2022-08-31 DIAGNOSIS — M25651 Stiffness of right hip, not elsewhere classified: Secondary | ICD-10-CM | POA: Diagnosis not present

## 2022-08-31 DIAGNOSIS — M25551 Pain in right hip: Secondary | ICD-10-CM | POA: Diagnosis not present

## 2022-08-31 DIAGNOSIS — R262 Difficulty in walking, not elsewhere classified: Secondary | ICD-10-CM | POA: Diagnosis not present

## 2022-08-31 DIAGNOSIS — Z96641 Presence of right artificial hip joint: Secondary | ICD-10-CM | POA: Diagnosis not present

## 2022-08-31 DIAGNOSIS — M9903 Segmental and somatic dysfunction of lumbar region: Secondary | ICD-10-CM | POA: Diagnosis not present

## 2022-08-31 DIAGNOSIS — M5431 Sciatica, right side: Secondary | ICD-10-CM | POA: Diagnosis not present

## 2022-09-01 ENCOUNTER — Inpatient Hospital Stay
Admission: RE | Admit: 2022-09-01 | Discharge: 2022-09-01 | Disposition: A | Discharge status: Home / Self Care | Payer: PPO | Source: Ambulatory Visit | Attending: Physician Assistant | Admitting: Physician Assistant

## 2022-09-01 ENCOUNTER — Ambulatory Visit
Admission: RE | Admit: 2022-09-01 | Discharge: 2022-09-01 | Disposition: A | Discharge status: Home / Self Care | Payer: PPO | Source: Ambulatory Visit | Attending: Physician Assistant | Admitting: Physician Assistant

## 2022-09-01 DIAGNOSIS — M5416 Radiculopathy, lumbar region: Secondary | ICD-10-CM

## 2022-09-01 DIAGNOSIS — M47816 Spondylosis without myelopathy or radiculopathy, lumbar region: Secondary | ICD-10-CM | POA: Diagnosis not present

## 2022-09-01 DIAGNOSIS — M545 Low back pain, unspecified: Secondary | ICD-10-CM | POA: Diagnosis not present

## 2022-09-01 MED ORDER — METHYLPREDNISOLONE ACETATE 40 MG/ML INJ SUSP (RADIOLOG
80.0000 mg | Freq: Once | INTRAMUSCULAR | Status: AC
  Administered 2022-09-01: 80 mg via INTRA_ARTICULAR

## 2022-09-01 MED ORDER — IOPAMIDOL (ISOVUE-M 200) INJECTION 41%
1.0000 mL | Freq: Once | INTRAMUSCULAR | Status: AC
  Administered 2022-09-01: 1 mL via INTRA_ARTICULAR

## 2022-09-01 NOTE — Discharge Instructions (Signed)

## 2022-09-07 ENCOUNTER — Other Ambulatory Visit: Payer: Self-pay | Admitting: Physician Assistant

## 2022-09-07 DIAGNOSIS — M5431 Sciatica, right side: Secondary | ICD-10-CM | POA: Diagnosis not present

## 2022-09-07 DIAGNOSIS — M40292 Other kyphosis, cervical region: Secondary | ICD-10-CM | POA: Diagnosis not present

## 2022-09-07 DIAGNOSIS — R262 Difficulty in walking, not elsewhere classified: Secondary | ICD-10-CM | POA: Diagnosis not present

## 2022-09-07 DIAGNOSIS — M25551 Pain in right hip: Secondary | ICD-10-CM | POA: Diagnosis not present

## 2022-09-07 DIAGNOSIS — M7542 Impingement syndrome of left shoulder: Secondary | ICD-10-CM | POA: Diagnosis not present

## 2022-09-07 DIAGNOSIS — Z96641 Presence of right artificial hip joint: Secondary | ICD-10-CM | POA: Diagnosis not present

## 2022-09-07 DIAGNOSIS — M25651 Stiffness of right hip, not elsewhere classified: Secondary | ICD-10-CM | POA: Diagnosis not present

## 2022-09-07 DIAGNOSIS — M7541 Impingement syndrome of right shoulder: Secondary | ICD-10-CM | POA: Diagnosis not present

## 2022-09-07 DIAGNOSIS — M9903 Segmental and somatic dysfunction of lumbar region: Secondary | ICD-10-CM | POA: Diagnosis not present

## 2022-09-14 DIAGNOSIS — M7542 Impingement syndrome of left shoulder: Secondary | ICD-10-CM | POA: Diagnosis not present

## 2022-09-14 DIAGNOSIS — M5431 Sciatica, right side: Secondary | ICD-10-CM | POA: Diagnosis not present

## 2022-09-14 DIAGNOSIS — Z96641 Presence of right artificial hip joint: Secondary | ICD-10-CM | POA: Diagnosis not present

## 2022-09-14 DIAGNOSIS — M25551 Pain in right hip: Secondary | ICD-10-CM | POA: Diagnosis not present

## 2022-09-14 DIAGNOSIS — M7541 Impingement syndrome of right shoulder: Secondary | ICD-10-CM | POA: Diagnosis not present

## 2022-09-14 DIAGNOSIS — M9903 Segmental and somatic dysfunction of lumbar region: Secondary | ICD-10-CM | POA: Diagnosis not present

## 2022-09-14 DIAGNOSIS — R262 Difficulty in walking, not elsewhere classified: Secondary | ICD-10-CM | POA: Diagnosis not present

## 2022-09-14 DIAGNOSIS — M25651 Stiffness of right hip, not elsewhere classified: Secondary | ICD-10-CM | POA: Diagnosis not present

## 2022-09-14 DIAGNOSIS — M40292 Other kyphosis, cervical region: Secondary | ICD-10-CM | POA: Diagnosis not present

## 2022-09-16 ENCOUNTER — Other Ambulatory Visit: Payer: Self-pay | Admitting: *Deleted

## 2022-09-16 MED ORDER — ROSUVASTATIN CALCIUM 10 MG PO TABS: 10.0000 mg | ORAL_TABLET | Freq: Every day | ORAL | 3 refills | Status: DC

## 2022-09-21 DIAGNOSIS — M5431 Sciatica, right side: Secondary | ICD-10-CM | POA: Diagnosis not present

## 2022-09-21 DIAGNOSIS — M9903 Segmental and somatic dysfunction of lumbar region: Secondary | ICD-10-CM | POA: Diagnosis not present

## 2022-09-24 ENCOUNTER — Ambulatory Visit (INDEPENDENT_AMBULATORY_CARE_PROVIDER_SITE_OTHER): Payer: PPO | Admitting: Podiatry

## 2022-09-24 ENCOUNTER — Ambulatory Visit (INDEPENDENT_AMBULATORY_CARE_PROVIDER_SITE_OTHER): Payer: PPO

## 2022-09-24 DIAGNOSIS — M2032 Hallux varus (acquired), left foot: Secondary | ICD-10-CM | POA: Diagnosis not present

## 2022-09-24 DIAGNOSIS — M79672 Pain in left foot: Secondary | ICD-10-CM

## 2022-09-24 DIAGNOSIS — L97501 Non-pressure chronic ulcer of other part of unspecified foot limited to breakdown of skin: Secondary | ICD-10-CM | POA: Diagnosis not present

## 2022-09-29 DIAGNOSIS — M9903 Segmental and somatic dysfunction of lumbar region: Secondary | ICD-10-CM | POA: Diagnosis not present

## 2022-09-29 DIAGNOSIS — M5431 Sciatica, right side: Secondary | ICD-10-CM | POA: Diagnosis not present

## 2022-09-30 DIAGNOSIS — M25651 Stiffness of right hip, not elsewhere classified: Secondary | ICD-10-CM | POA: Diagnosis not present

## 2022-09-30 DIAGNOSIS — Z96641 Presence of right artificial hip joint: Secondary | ICD-10-CM | POA: Diagnosis not present

## 2022-09-30 DIAGNOSIS — M40292 Other kyphosis, cervical region: Secondary | ICD-10-CM | POA: Diagnosis not present

## 2022-09-30 DIAGNOSIS — M7541 Impingement syndrome of right shoulder: Secondary | ICD-10-CM | POA: Diagnosis not present

## 2022-09-30 DIAGNOSIS — M7542 Impingement syndrome of left shoulder: Secondary | ICD-10-CM | POA: Diagnosis not present

## 2022-09-30 DIAGNOSIS — R262 Difficulty in walking, not elsewhere classified: Secondary | ICD-10-CM | POA: Diagnosis not present

## 2022-09-30 DIAGNOSIS — M25551 Pain in right hip: Secondary | ICD-10-CM | POA: Diagnosis not present

## 2022-09-30 NOTE — Progress Notes (Signed)
Subjective: No chief complaint on file.    77 year old male presents the office today for follow evaluation of ulceration on his left big toe.  Since I saw him he just underwent hip surgery.  He has not been able to wear the foot defender, offloading boot previously because of his hip he has not tried it sounds. He is still using the collagen/blastX.    Objective: AAO x3, NAD DP/PT pulses palpable bilaterally, CRT less than 3 seconds Plantar aspect left hallux is a granular wound with hyperkeratotic periwound.  After debrided the wound today the wound is larger measuring 1 x 0.8 x 0.5 cm.  There is no probing to bone or tunneling.  However, the wound does appear to be deeper today.  100% granulation tissue noted.  There is no drainage or pus today.  There is no fluctuance or crepitation.  No malodor.  Patient hallux malleus is present. No pain with calf compression, swelling, warmth, erythema   Assessment: 77 year old male chronic ulceration left hallux  Plan: -All treatment options discussed with the patient including all alternatives, risks, complications.  -X-rays were obtained reviewed.  3 views of the foot were obtained.  No soft tissue edema.  Significant hallux malleus is present.  There is no definitive cortical changes that suggest osteomyelitis at this time there is no soft tissue edema. -Medically necessary wound debridement was performed today.  Sharply debrided the wound today utilizing #312 with scalpel down to healthy, granular tissue in order promote wound healing.  There is no blood loss.  Cleaned the wound with saline.  Pre and post wound measurements are the same.  I then applied collagen, blast X to the wound.  Dry dressing was then applied.  I showed him how to change the dressings he is in continue with daily dressing changes.  Continue offloading.  -Foot defender to help offload.  Hopefully now that his hip is not hurting he can wear this.  We discussed total contact cast  but will try the foot defender boot first. -We did discuss surgical intervention given the hallux malleus to help straighten the toe as I do think that the pressure is causing significant part of the issue.  He is also at risk of amputation which we discussed. -Monitor for any clinical signs or symptoms of infection and directed to call the office immediately should any occur or go to the ER. -Patient encouraged to call the office with any questions, concerns, change in symptoms.   Vivi Barrack DPM

## 2022-10-06 DIAGNOSIS — Z96641 Presence of right artificial hip joint: Secondary | ICD-10-CM | POA: Diagnosis not present

## 2022-10-06 DIAGNOSIS — M7542 Impingement syndrome of left shoulder: Secondary | ICD-10-CM | POA: Diagnosis not present

## 2022-10-06 DIAGNOSIS — M40292 Other kyphosis, cervical region: Secondary | ICD-10-CM | POA: Diagnosis not present

## 2022-10-06 DIAGNOSIS — R262 Difficulty in walking, not elsewhere classified: Secondary | ICD-10-CM | POA: Diagnosis not present

## 2022-10-06 DIAGNOSIS — M25551 Pain in right hip: Secondary | ICD-10-CM | POA: Diagnosis not present

## 2022-10-06 DIAGNOSIS — M25651 Stiffness of right hip, not elsewhere classified: Secondary | ICD-10-CM | POA: Diagnosis not present

## 2022-10-06 DIAGNOSIS — M7541 Impingement syndrome of right shoulder: Secondary | ICD-10-CM | POA: Diagnosis not present

## 2022-10-09 ENCOUNTER — Ambulatory Visit: Payer: PPO | Admitting: Podiatry

## 2022-10-09 DIAGNOSIS — L97512 Non-pressure chronic ulcer of other part of right foot with fat layer exposed: Secondary | ICD-10-CM | POA: Diagnosis not present

## 2022-10-09 NOTE — Progress Notes (Unsigned)
Subjective: Chief Complaint  Patient presents with   Foot Ulcer    Rm 11 Follow up left toe ulcer. Pt states great improvement.       77 year old male presents the office today for follow evaluation of ulceration on his left big toe.  He did try the foot defender boot again but not able to tolerate this as it was causing discomfort in his hip.  He did come up the device himself to help offload the toe which has been helping.  No drainage or pus.  No fevers/chills.    Objective: AAO x3, NAD DP/PT pulses palpable bilaterally, CRT less than 3 seconds Plantar aspect left hallux is a granular wound with mostly hyperkeratotic periwound and covering the ulcer.  After debrided the wound today the wound is larger measuring 1 x 0.6 x 0.5 cm.  There is no probing to bone or tunneling.  However, the wound does appear to be deeper today.  100% granulation tissue noted.  There is no drainage or pus today.  There is no fluctuance or crepitation.  No malodor.  Patient hallux malleus is present. No pain with calf compression, swelling, warmth, erythema   Assessment: 77 year old male chronic ulceration left hallux  Plan: -All treatment options discussed with the patient including all alternatives, risks, complications.  -Medically necessary wound debridement was performed today.  Sharply debrided the wound today utilizing #312 with scalpel down to healthy, granular tissue in order promote wound healing.  There is no blood loss.  Cleaned the wound with saline.  Post measurements noted above.  Prior to debridement the wound was mostly covered with callus.  Difficult to measure prior to debridement. I then applied collagen, blast X to the wound.  Dry dressing was then applied.  I showed him how to change the dressings he is in continue with daily dressing changes.  Continue offloading.  -He was not able to tolerate the foot defender boot so I doubt he will tolerate a total contact cast.  He has come up with his  own offloading device of the toe which I think may be helpful.  Will continue with this. -Vascular: He has palpable pulses and good cap refill time so I am going to hold off on repeat ABI.  The previous ABI was 2022 and was normal at that time. -Monitor for any clinical signs or symptoms of infection and directed to call the office immediately should any occur or go to the ER. -Patient encouraged to call the office with any questions, concerns, change in symptoms.   Vivi Barrack DPM

## 2022-10-12 DIAGNOSIS — M5431 Sciatica, right side: Secondary | ICD-10-CM | POA: Diagnosis not present

## 2022-10-12 DIAGNOSIS — M9903 Segmental and somatic dysfunction of lumbar region: Secondary | ICD-10-CM | POA: Diagnosis not present

## 2022-10-13 MED ORDER — OZEMPIC (2 MG/DOSE) 8 MG/3ML ~~LOC~~ SOPN: 2.0000 mg | PEN_INJECTOR | SUBCUTANEOUS | 11 refills | Status: DC

## 2022-10-13 NOTE — Addendum Note (Signed)
Addended by: Damyon Mullane E on: 10/13/2022 12:41 PM   Modules accepted: Orders

## 2022-10-18 ENCOUNTER — Other Ambulatory Visit: Payer: Self-pay | Admitting: Cardiovascular Disease

## 2022-10-19 ENCOUNTER — Other Ambulatory Visit: Payer: Self-pay | Admitting: *Deleted

## 2022-10-19 DIAGNOSIS — Z955 Presence of coronary angioplasty implant and graft: Secondary | ICD-10-CM

## 2022-10-19 DIAGNOSIS — I25110 Atherosclerotic heart disease of native coronary artery with unstable angina pectoris: Secondary | ICD-10-CM

## 2022-10-19 DIAGNOSIS — I255 Ischemic cardiomyopathy: Secondary | ICD-10-CM

## 2022-10-19 DIAGNOSIS — I10 Essential (primary) hypertension: Secondary | ICD-10-CM

## 2022-10-19 DIAGNOSIS — I2510 Atherosclerotic heart disease of native coronary artery without angina pectoris: Secondary | ICD-10-CM

## 2022-10-19 DIAGNOSIS — E785 Hyperlipidemia, unspecified: Secondary | ICD-10-CM

## 2022-10-19 MED ORDER — BISOPROLOL FUMARATE 10 MG PO TABS
10.0000 mg | ORAL_TABLET | Freq: Every morning | ORAL | 1 refills | Status: DC
Start: 2022-10-19 — End: 2022-12-29

## 2022-10-19 NOTE — Telephone Encounter (Signed)
Refill request

## 2022-10-23 DIAGNOSIS — R262 Difficulty in walking, not elsewhere classified: Secondary | ICD-10-CM | POA: Diagnosis not present

## 2022-10-23 DIAGNOSIS — M7541 Impingement syndrome of right shoulder: Secondary | ICD-10-CM | POA: Diagnosis not present

## 2022-10-23 DIAGNOSIS — M25651 Stiffness of right hip, not elsewhere classified: Secondary | ICD-10-CM | POA: Diagnosis not present

## 2022-10-23 DIAGNOSIS — M7542 Impingement syndrome of left shoulder: Secondary | ICD-10-CM | POA: Diagnosis not present

## 2022-10-23 DIAGNOSIS — M40292 Other kyphosis, cervical region: Secondary | ICD-10-CM | POA: Diagnosis not present

## 2022-10-23 DIAGNOSIS — M25551 Pain in right hip: Secondary | ICD-10-CM | POA: Diagnosis not present

## 2022-10-23 DIAGNOSIS — Z96641 Presence of right artificial hip joint: Secondary | ICD-10-CM | POA: Diagnosis not present

## 2022-10-26 DIAGNOSIS — M5431 Sciatica, right side: Secondary | ICD-10-CM | POA: Diagnosis not present

## 2022-10-26 DIAGNOSIS — M9903 Segmental and somatic dysfunction of lumbar region: Secondary | ICD-10-CM | POA: Diagnosis not present

## 2022-10-27 ENCOUNTER — Ambulatory Visit: Payer: PPO | Admitting: Podiatry

## 2022-10-27 DIAGNOSIS — M2032 Hallux varus (acquired), left foot: Secondary | ICD-10-CM | POA: Diagnosis not present

## 2022-10-27 DIAGNOSIS — L97512 Non-pressure chronic ulcer of other part of right foot with fat layer exposed: Secondary | ICD-10-CM | POA: Diagnosis not present

## 2022-10-27 MED ORDER — MUPIROCIN 2 % EX OINT: 1.0000 | TOPICAL_OINTMENT | Freq: Two times a day (BID) | CUTANEOUS | 2 refills | Status: DC

## 2022-10-27 NOTE — Addendum Note (Signed)
Addended by: Ovid Curd R on: 10/27/2022 10:44 AM   Modules accepted: Orders

## 2022-10-27 NOTE — Progress Notes (Signed)
Subjective: Chief Complaint  Patient presents with   Ulcer    Right foot ulcer       77 year old male presents the office today for follow evaluation of ulceration on his left big toe.  States he is doing much better.  Has been keeping a foam offloading pad to the toe is still helping.  He has not seen any recent drainage, bleeding or any purulence. No redness.  No fevers or chills.   Objective: AAO x3, NAD DP/PT pulses palpable bilaterally, CRT less than 3 seconds Plantar aspect left hallux with hyperkeratotic lesion.  Upon debridement appears the wound is healed although it is still preulcerative.  There is no surrounding erythema, ascending cellulitis.  There is no fluctuation, crepitation, malodor.  There is no drainage or pus. No pain with calf compression, swelling, warmth, erythema   Assessment: 77 year old male chronic ulceration left hallux- much improved.   Plan: -All treatment options discussed with the patient including all alternatives, risks, complications.  -Patient debrided the hyperkeratotic lesion without any complications and it appears that the ulceration is healed.  There is no obvious signs of infection noted today.  He needs to monitor closely signs or symptoms of recurrence of infection and/or ulceration.  Continue offloading.  I did make an offloading pad as well to try to help.  Return in about 4 weeks (around 11/24/2022) for toe ulcer.  Vivi Barrack DPM

## 2022-11-02 ENCOUNTER — Telehealth: Payer: Self-pay | Admitting: Cardiology

## 2022-11-02 DIAGNOSIS — M5431 Sciatica, right side: Secondary | ICD-10-CM | POA: Diagnosis not present

## 2022-11-02 DIAGNOSIS — M9903 Segmental and somatic dysfunction of lumbar region: Secondary | ICD-10-CM | POA: Diagnosis not present

## 2022-11-02 NOTE — Telephone Encounter (Signed)
Left message for the pt to call the clinic

## 2022-11-02 NOTE — Telephone Encounter (Signed)
  Pt is requesting to get a callback from [facility]. He said, he has some questions and wants to speak with her

## 2022-11-02 NOTE — Telephone Encounter (Signed)
Patient is following up requesting a call back from [facility] when she returns to the office. He understands she will not return to the office until next week and he is fine waiting until then.

## 2022-11-03 DIAGNOSIS — M25651 Stiffness of right hip, not elsewhere classified: Secondary | ICD-10-CM | POA: Diagnosis not present

## 2022-11-03 DIAGNOSIS — R262 Difficulty in walking, not elsewhere classified: Secondary | ICD-10-CM | POA: Diagnosis not present

## 2022-11-03 DIAGNOSIS — M7541 Impingement syndrome of right shoulder: Secondary | ICD-10-CM | POA: Diagnosis not present

## 2022-11-03 DIAGNOSIS — Z96641 Presence of right artificial hip joint: Secondary | ICD-10-CM | POA: Diagnosis not present

## 2022-11-03 DIAGNOSIS — M25551 Pain in right hip: Secondary | ICD-10-CM | POA: Diagnosis not present

## 2022-11-03 DIAGNOSIS — M40292 Other kyphosis, cervical region: Secondary | ICD-10-CM | POA: Diagnosis not present

## 2022-11-03 DIAGNOSIS — M7542 Impingement syndrome of left shoulder: Secondary | ICD-10-CM | POA: Diagnosis not present

## 2022-11-09 DIAGNOSIS — M5431 Sciatica, right side: Secondary | ICD-10-CM | POA: Diagnosis not present

## 2022-11-09 DIAGNOSIS — M9903 Segmental and somatic dysfunction of lumbar region: Secondary | ICD-10-CM | POA: Diagnosis not present

## 2022-11-09 NOTE — Telephone Encounter (Signed)
Alex Jackson, Alex Jackson - 11/02/2022  8:36 AM Alex Sprague, MD  Sent: Mon November 09, 2022  9:32 AM  To: Loa Socks, LPN         Message  He can see Dr. Clifton James   Pt aware that Dr. Shari Prows recommends that he establish care with Dr. Pearletha Alfred.  Pt will be due to see him around end of November/Dec of this year.  Pt is aware that I will send this message back to our scheduling dept to call him back to arrange his next appt with Dr. Clifton James to establish care with for that time.

## 2022-11-09 NOTE — Telephone Encounter (Signed)
Will inquire from Dr. Shari Prows who she suggest the pt establish with as his new Cardiologist.    Will follow-up with the pt accordingly thereafter.

## 2022-11-10 DIAGNOSIS — R262 Difficulty in walking, not elsewhere classified: Secondary | ICD-10-CM | POA: Diagnosis not present

## 2022-11-10 DIAGNOSIS — M25551 Pain in right hip: Secondary | ICD-10-CM | POA: Diagnosis not present

## 2022-11-10 DIAGNOSIS — Z96641 Presence of right artificial hip joint: Secondary | ICD-10-CM | POA: Diagnosis not present

## 2022-11-10 DIAGNOSIS — M25651 Stiffness of right hip, not elsewhere classified: Secondary | ICD-10-CM | POA: Diagnosis not present

## 2022-11-10 DIAGNOSIS — M7542 Impingement syndrome of left shoulder: Secondary | ICD-10-CM | POA: Diagnosis not present

## 2022-11-10 DIAGNOSIS — M7541 Impingement syndrome of right shoulder: Secondary | ICD-10-CM | POA: Diagnosis not present

## 2022-11-10 DIAGNOSIS — M40292 Other kyphosis, cervical region: Secondary | ICD-10-CM | POA: Diagnosis not present

## 2022-11-26 ENCOUNTER — Ambulatory Visit: Payer: PPO | Admitting: Podiatry

## 2022-12-01 ENCOUNTER — Ambulatory Visit: Payer: PPO | Admitting: Podiatry

## 2022-12-01 DIAGNOSIS — Z96641 Presence of right artificial hip joint: Secondary | ICD-10-CM | POA: Diagnosis not present

## 2022-12-01 DIAGNOSIS — M7541 Impingement syndrome of right shoulder: Secondary | ICD-10-CM | POA: Diagnosis not present

## 2022-12-01 DIAGNOSIS — R262 Difficulty in walking, not elsewhere classified: Secondary | ICD-10-CM | POA: Diagnosis not present

## 2022-12-01 DIAGNOSIS — M2032 Hallux varus (acquired), left foot: Secondary | ICD-10-CM

## 2022-12-01 DIAGNOSIS — L97512 Non-pressure chronic ulcer of other part of right foot with fat layer exposed: Secondary | ICD-10-CM | POA: Diagnosis not present

## 2022-12-01 DIAGNOSIS — M25651 Stiffness of right hip, not elsewhere classified: Secondary | ICD-10-CM | POA: Diagnosis not present

## 2022-12-01 DIAGNOSIS — M40292 Other kyphosis, cervical region: Secondary | ICD-10-CM | POA: Diagnosis not present

## 2022-12-01 DIAGNOSIS — M25551 Pain in right hip: Secondary | ICD-10-CM | POA: Diagnosis not present

## 2022-12-01 DIAGNOSIS — M7542 Impingement syndrome of left shoulder: Secondary | ICD-10-CM | POA: Diagnosis not present

## 2022-12-01 MED ORDER — SILVER SULFADIAZINE 1 % EX CREA: 1.0000 | TOPICAL_CREAM | Freq: Every day | CUTANEOUS | 0 refills | Status: DC

## 2022-12-06 NOTE — Progress Notes (Signed)
Subjective: Chief Complaint  Patient presents with   Ulcer    Pt is here for toe ulcer on his left foot he states he feels it is doing much better     77 year old male presents the office today for follow evaluation of ulceration on his left big toe.  States he is doing better.  No drainage or pus or any swelling redness.  No fevers or chills.  He is continue the offloading devices which has been helpful.  No new concerns.    Objective: AAO x3, NAD DP/PT pulses palpable bilaterally, CRT less than 3 seconds Plantar aspect left hallux with hyperkeratotic lesion.  There is a superficial area of skin breakdown without any probing, amount or tunneling.  There is no surrounding erythema, ascending cellulitis.  There is no fluctuation or crepitation but there is no malodor.  There is no obvious signs of infection today. No pain with calf compression, swelling, warmth, erythema   Assessment: 77 year old male chronic ulceration left hallux  Plan: -All treatment options discussed with the patient including all alternatives, risks, complications.  -Separate hyperkeratotic lesions revealed superficial area of skin breakdown.  Continue daily dressings with Silvadene which I prescribed and applied today. -Made him more offloading pads to help take pressure off the toe. -Monitor for any clinical signs or symptoms of infection and directed to call the office immediately should any occur or go to the ER.  Return in about 3 weeks (around 12/22/2022) for ulcer toe.  Vivi Barrack DPM

## 2022-12-07 DIAGNOSIS — M5431 Sciatica, right side: Secondary | ICD-10-CM | POA: Diagnosis not present

## 2022-12-07 DIAGNOSIS — M9903 Segmental and somatic dysfunction of lumbar region: Secondary | ICD-10-CM | POA: Diagnosis not present

## 2022-12-14 ENCOUNTER — Other Ambulatory Visit (INDEPENDENT_AMBULATORY_CARE_PROVIDER_SITE_OTHER): Payer: PPO

## 2022-12-14 ENCOUNTER — Ambulatory Visit: Payer: PPO | Admitting: Orthopaedic Surgery

## 2022-12-14 ENCOUNTER — Encounter: Payer: Self-pay | Admitting: Orthopaedic Surgery

## 2022-12-14 DIAGNOSIS — M25562 Pain in left knee: Secondary | ICD-10-CM

## 2022-12-14 DIAGNOSIS — M25561 Pain in right knee: Secondary | ICD-10-CM

## 2022-12-14 MED ORDER — LIDOCAINE HCL 1 % IJ SOLN
3.0000 mL | INTRAMUSCULAR | Status: AC | PRN
Start: 2022-12-14 — End: 2022-12-14
  Administered 2022-12-14: 3 mL

## 2022-12-14 MED ORDER — METHYLPREDNISOLONE ACETATE 40 MG/ML IJ SUSP
40.0000 mg | INTRAMUSCULAR | Status: AC | PRN
Start: 2022-12-14 — End: 2022-12-14
  Administered 2022-12-14: 40 mg via INTRA_ARTICULAR

## 2022-12-14 NOTE — Progress Notes (Signed)
The patient is a 77 year old gentleman well-known to [place].  We replaced his right hip in January of this year.  He was at the golf tournament this weekend and he sustained a mechanical fall after he got up from a sitting position injuring both of his knees.  He feels like the left knee just gave out on him.  He has a remote history of left knee arthroscopic surgery years ago for a meniscal tear.  Both knees were examined today and have abrasions over the anterior aspect of both knees.  Both knees actually have a moderate effusion.  Both knees have no significant malalignment with good range of motion but are painful.  X-rays of both knees are obtained today and both knees show tricompartment arthritis worse on the left knee than the right knee.  I did recommend an aspiration of both knees today and then a steroid injection in both knees.  I also recommended knee sleeves to try.  He should work on quad strengthening exercises in the interim.  I only aspirated about 10 to 15 cc of fluid off of both knees and placed a sterile injection of both knees.  We will see him back in 6 weeks.  At that visit we will actually have a standing AP pelvis and lateral of his right operative hip from January of this year.      Procedure Note  Patient: Alex Jackson             Date of Birth: 26-Dec-1945           MRN: [id number]             Visit Date: 12/14/2022  Procedures: Visit Diagnoses:  1. Acute pain of left knee   2. Acute pain of right knee     Large Joint Inj: R knee on 12/14/2022 4:09 PM Indications: diagnostic evaluation and pain Details: 22 G 1.5 in needle, superolateral approach  Arthrogram: No  Medications: 3 mL lidocaine 1 %; 40 mg methylPREDNISolone acetate 40 MG/ML Outcome: tolerated well, no immediate complications Procedure, treatment alternatives, risks and benefits explained, specific risks discussed. Consent was given by the patient. Immediately prior to procedure a time out was  called to verify the correct patient, procedure, equipment, support staff and site/side marked as required. Patient was prepped and draped in the usual sterile fashion.    Large Joint Inj: L knee on 12/14/2022 4:09 PM Indications: diagnostic evaluation and pain Details: 22 G 1.5 in needle, superolateral approach  Arthrogram: No  Medications: 3 mL lidocaine 1 %; 40 mg methylPREDNISolone acetate 40 MG/ML Outcome: tolerated well, no immediate complications Procedure, treatment alternatives, risks and benefits explained, specific risks discussed. Consent was given by the patient. Immediately prior to procedure a time out was called to verify the correct patient, procedure, equipment, support staff and site/side marked as required. Patient was prepped and draped in the usual sterile fashion.

## 2022-12-15 DIAGNOSIS — M25551 Pain in right hip: Secondary | ICD-10-CM | POA: Diagnosis not present

## 2022-12-15 DIAGNOSIS — M7541 Impingement syndrome of right shoulder: Secondary | ICD-10-CM | POA: Diagnosis not present

## 2022-12-15 DIAGNOSIS — M40292 Other kyphosis, cervical region: Secondary | ICD-10-CM | POA: Diagnosis not present

## 2022-12-15 DIAGNOSIS — Z96641 Presence of right artificial hip joint: Secondary | ICD-10-CM | POA: Diagnosis not present

## 2022-12-15 DIAGNOSIS — R262 Difficulty in walking, not elsewhere classified: Secondary | ICD-10-CM | POA: Diagnosis not present

## 2022-12-15 DIAGNOSIS — M7542 Impingement syndrome of left shoulder: Secondary | ICD-10-CM | POA: Diagnosis not present

## 2022-12-15 DIAGNOSIS — M25651 Stiffness of right hip, not elsewhere classified: Secondary | ICD-10-CM | POA: Diagnosis not present

## 2022-12-16 ENCOUNTER — Ambulatory Visit: Payer: PPO | Admitting: Orthopaedic Surgery

## 2022-12-21 DIAGNOSIS — M9903 Segmental and somatic dysfunction of lumbar region: Secondary | ICD-10-CM | POA: Diagnosis not present

## 2022-12-21 DIAGNOSIS — M5431 Sciatica, right side: Secondary | ICD-10-CM | POA: Diagnosis not present

## 2022-12-22 ENCOUNTER — Ambulatory Visit: Payer: PPO | Admitting: Podiatry

## 2022-12-22 DIAGNOSIS — M7542 Impingement syndrome of left shoulder: Secondary | ICD-10-CM | POA: Diagnosis not present

## 2022-12-22 DIAGNOSIS — M7541 Impingement syndrome of right shoulder: Secondary | ICD-10-CM | POA: Diagnosis not present

## 2022-12-22 DIAGNOSIS — R262 Difficulty in walking, not elsewhere classified: Secondary | ICD-10-CM | POA: Diagnosis not present

## 2022-12-22 DIAGNOSIS — Z96641 Presence of right artificial hip joint: Secondary | ICD-10-CM | POA: Diagnosis not present

## 2022-12-22 DIAGNOSIS — L97511 Non-pressure chronic ulcer of other part of right foot limited to breakdown of skin: Secondary | ICD-10-CM | POA: Diagnosis not present

## 2022-12-22 DIAGNOSIS — M40292 Other kyphosis, cervical region: Secondary | ICD-10-CM | POA: Diagnosis not present

## 2022-12-22 DIAGNOSIS — M25651 Stiffness of right hip, not elsewhere classified: Secondary | ICD-10-CM | POA: Diagnosis not present

## 2022-12-22 DIAGNOSIS — M25551 Pain in right hip: Secondary | ICD-10-CM | POA: Diagnosis not present

## 2022-12-22 NOTE — Progress Notes (Unsigned)
silvdene

## 2022-12-29 ENCOUNTER — Other Ambulatory Visit: Payer: Self-pay

## 2022-12-29 ENCOUNTER — Other Ambulatory Visit (HOSPITAL_BASED_OUTPATIENT_CLINIC_OR_DEPARTMENT_OTHER): Payer: Self-pay

## 2022-12-29 ENCOUNTER — Other Ambulatory Visit: Payer: Self-pay | Admitting: Cardiovascular Disease

## 2022-12-29 ENCOUNTER — Other Ambulatory Visit: Payer: Self-pay | Admitting: Pharmacist

## 2022-12-29 DIAGNOSIS — E785 Hyperlipidemia, unspecified: Secondary | ICD-10-CM

## 2022-12-29 DIAGNOSIS — I25110 Atherosclerotic heart disease of native coronary artery with unstable angina pectoris: Secondary | ICD-10-CM

## 2022-12-29 DIAGNOSIS — I255 Ischemic cardiomyopathy: Secondary | ICD-10-CM

## 2022-12-29 DIAGNOSIS — M7541 Impingement syndrome of right shoulder: Secondary | ICD-10-CM | POA: Diagnosis not present

## 2022-12-29 DIAGNOSIS — I10 Essential (primary) hypertension: Secondary | ICD-10-CM

## 2022-12-29 DIAGNOSIS — M25551 Pain in right hip: Secondary | ICD-10-CM | POA: Diagnosis not present

## 2022-12-29 DIAGNOSIS — R262 Difficulty in walking, not elsewhere classified: Secondary | ICD-10-CM | POA: Diagnosis not present

## 2022-12-29 DIAGNOSIS — I2510 Atherosclerotic heart disease of native coronary artery without angina pectoris: Secondary | ICD-10-CM

## 2022-12-29 DIAGNOSIS — Z955 Presence of coronary angioplasty implant and graft: Secondary | ICD-10-CM

## 2022-12-29 DIAGNOSIS — M40292 Other kyphosis, cervical region: Secondary | ICD-10-CM | POA: Diagnosis not present

## 2022-12-29 DIAGNOSIS — M7542 Impingement syndrome of left shoulder: Secondary | ICD-10-CM | POA: Diagnosis not present

## 2022-12-29 DIAGNOSIS — M25651 Stiffness of right hip, not elsewhere classified: Secondary | ICD-10-CM | POA: Diagnosis not present

## 2022-12-29 DIAGNOSIS — Z96641 Presence of right artificial hip joint: Secondary | ICD-10-CM | POA: Diagnosis not present

## 2022-12-29 MED ORDER — BISOPROLOL FUMARATE 10 MG PO TABS
10.0000 mg | ORAL_TABLET | Freq: Every morning | ORAL | 1 refills | Status: DC
Start: 2022-12-29 — End: 2023-07-03
  Filled 2022-12-29 – 2023-01-01 (×2): qty 90, 90d supply, fill #0
  Filled 2023-04-09 – 2023-04-10 (×2): qty 90, 90d supply, fill #1

## 2022-12-29 MED ORDER — OZEMPIC (2 MG/DOSE) 8 MG/3ML ~~LOC~~ SOPN
2.0000 mg | PEN_INJECTOR | SUBCUTANEOUS | 11 refills | Status: DC
  Filled 2022-12-29: qty 3, 28d supply, fill #0
  Filled 2023-02-13: qty 3, 28d supply, fill #1
  Filled 2023-03-08: qty 3, 28d supply, fill #2
  Filled 2023-04-09 – 2023-04-10 (×2): qty 3, 28d supply, fill #3
  Filled 2023-04-26 – 2023-04-27 (×2): qty 3, 28d supply, fill #4
  Filled 2023-05-27: qty 3, 28d supply, fill #5
  Filled 2023-06-23: qty 3, 28d supply, fill #6
  Filled 2023-07-25: qty 3, 28d supply, fill #7
  Filled 2023-08-17: qty 3, 28d supply, fill #8
  Filled 2023-09-15 (×2): qty 3, 28d supply, fill #9
  Filled 2023-10-18: qty 3, 28d supply, fill #10
  Filled 2023-11-08: qty 3, 28d supply, fill #11

## 2022-12-29 MED ORDER — FUROSEMIDE 40 MG PO TABS
40.0000 mg | ORAL_TABLET | Freq: Every day | ORAL | 4 refills | Status: DC | PRN
  Filled 2022-12-29 – 2023-04-10 (×2): qty 30, 30d supply, fill #0

## 2022-12-29 MED ORDER — LISINOPRIL 40 MG PO TABS
40.0000 mg | ORAL_TABLET | Freq: Every day | ORAL | 1 refills | Status: DC
  Filled 2022-12-29 – 2023-01-01 (×2): qty 90, 90d supply, fill #0
  Filled 2023-04-09 – 2023-04-10 (×2): qty 90, 90d supply, fill #1

## 2022-12-29 MED ORDER — CHLORTHALIDONE 25 MG PO TABS
25.0000 mg | ORAL_TABLET | Freq: Every day | ORAL | 1 refills | Status: DC
  Filled 2022-12-29 – 2023-01-01 (×2): qty 90, 90d supply, fill #0
  Filled 2023-04-09 – 2023-04-10 (×2): qty 90, 90d supply, fill #1

## 2022-12-30 ENCOUNTER — Telehealth: Payer: Self-pay | Admitting: Cardiology

## 2022-12-30 ENCOUNTER — Other Ambulatory Visit: Payer: Self-pay | Admitting: Physician Assistant

## 2022-12-30 ENCOUNTER — Other Ambulatory Visit: Payer: Self-pay | Admitting: Cardiovascular Disease

## 2022-12-30 ENCOUNTER — Other Ambulatory Visit: Payer: Self-pay

## 2022-12-30 ENCOUNTER — Other Ambulatory Visit (HOSPITAL_BASED_OUTPATIENT_CLINIC_OR_DEPARTMENT_OTHER): Payer: Self-pay

## 2022-12-30 NOTE — Telephone Encounter (Signed)
Spoke with patient and he would like for Nurse Lajoyce Corners to give him a call

## 2022-12-30 NOTE — Telephone Encounter (Signed)
Patient wants a call back from RN Lajoyce Corners to discuss questions he has regarding statins and Ozempic.

## 2022-12-31 ENCOUNTER — Other Ambulatory Visit: Payer: Self-pay

## 2022-12-31 ENCOUNTER — Other Ambulatory Visit (HOSPITAL_BASED_OUTPATIENT_CLINIC_OR_DEPARTMENT_OTHER): Payer: Self-pay

## 2022-12-31 MED ORDER — PANTOPRAZOLE SODIUM 40 MG PO TBEC
40.0000 mg | DELAYED_RELEASE_TABLET | Freq: Every day | ORAL | 1 refills | Status: DC
  Filled 2022-12-31 – 2023-01-06 (×2): qty 90, 90d supply, fill #0
  Filled 2023-04-09 – 2023-04-10 (×2): qty 90, 90d supply, fill #1

## 2022-12-31 MED ORDER — ROSUVASTATIN CALCIUM 10 MG PO TABS
10.0000 mg | ORAL_TABLET | Freq: Every day | ORAL | 1 refills | Status: DC
  Filled 2022-12-31: qty 90, 90d supply, fill #0
  Filled 2023-04-09 – 2023-04-10 (×2): qty 90, 90d supply, fill #1

## 2022-12-31 MED ORDER — GABAPENTIN 300 MG PO CAPS
300.0000 mg | ORAL_CAPSULE | Freq: Every day | ORAL | 1 refills | Status: DC
  Filled 2022-12-31: qty 30, 30d supply, fill #0

## 2022-12-31 NOTE — Telephone Encounter (Signed)
Called the pt back.  Pt is calling to get advisement and follow-up from our PharmD team.  Pt states he is going to see his Allergist on next Tuesday 9/6 for reconsideration of getting allergy shots.    Pt states Allergist told him that he cannot have allergy shots and be on statins at the same time.   Allergist advised for him to follow-up with our office to see if our lipid clinic could possibly assist in getting him on a regimen for his lipids that will not effect his allergy shots if he gets them.  Pt states he will go more in detail about this matter when PharmD team calls him back.   He states Education officer, community has been working with him and his ozempic.   Pt ask that I have Megan or another Pharmacist call him back to further assist with this matter.  Will route this to our PharmD team for further management and follow-up with the pt.   Pt verbalized understanding and agrees with this plan.

## 2022-12-31 NOTE — Telephone Encounter (Signed)
Please review

## 2023-01-01 ENCOUNTER — Other Ambulatory Visit (HOSPITAL_BASED_OUTPATIENT_CLINIC_OR_DEPARTMENT_OTHER): Payer: Self-pay

## 2023-01-01 NOTE — Telephone Encounter (Signed)
I am not aware of any interaction of statins and allergy shots. Called pt. Went right to VM. LVM

## 2023-01-01 NOTE — Telephone Encounter (Signed)
Spoke with patient. Advised that I have not been able to find anything on statins and interfering with allergy shots. He said his allergist told him this several years ago. He will be going back to him tuesday and he will let us know if he still thinks its an issue.

## 2023-01-04 ENCOUNTER — Other Ambulatory Visit: Payer: Self-pay | Admitting: Podiatry

## 2023-01-04 ENCOUNTER — Other Ambulatory Visit: Payer: Self-pay | Admitting: Orthopaedic Surgery

## 2023-01-05 ENCOUNTER — Other Ambulatory Visit (HOSPITAL_BASED_OUTPATIENT_CLINIC_OR_DEPARTMENT_OTHER): Payer: Self-pay

## 2023-01-05 DIAGNOSIS — R052 Subacute cough: Secondary | ICD-10-CM | POA: Diagnosis not present

## 2023-01-05 DIAGNOSIS — J300 Vasomotor rhinitis: Secondary | ICD-10-CM | POA: Diagnosis not present

## 2023-01-05 DIAGNOSIS — H1045 Other chronic allergic conjunctivitis: Secondary | ICD-10-CM | POA: Diagnosis not present

## 2023-01-05 DIAGNOSIS — K219 Gastro-esophageal reflux disease without esophagitis: Secondary | ICD-10-CM | POA: Diagnosis not present

## 2023-01-05 MED ORDER — LEVOCETIRIZINE DIHYDROCHLORIDE 5 MG PO TABS
5.0000 mg | ORAL_TABLET | Freq: Every evening | ORAL | 0 refills | Status: DC
  Filled 2023-01-05 – 2023-01-06 (×2): qty 30, 30d supply, fill #0

## 2023-01-05 MED ORDER — IPRATROPIUM BROMIDE 0.03 % NA SOLN
1.0000 | Freq: Four times a day (QID) | NASAL | 5 refills | Status: AC | PRN
  Filled 2023-01-05: qty 30, 30d supply, fill #0
  Filled 2023-03-01: qty 30, 30d supply, fill #1
  Filled 2023-04-09 – 2023-04-10 (×2): qty 30, 30d supply, fill #2

## 2023-01-05 MED ORDER — KETOTIFEN FUMARATE 0.035 % OP SOLN
1.0000 [drp] | Freq: Two times a day (BID) | OPHTHALMIC | 5 refills | Status: DC
Start: 2023-01-05 — End: 2023-03-19
  Filled 2023-01-05 – 2023-01-06 (×2): qty 10, 100d supply, fill #0

## 2023-01-05 MED ORDER — BUDESONIDE-FORMOTEROL FUMARATE 80-4.5 MCG/ACT IN AERO
2.0000 | INHALATION_SPRAY | RESPIRATORY_TRACT | 5 refills | Status: DC | PRN
  Filled 2023-01-05 – 2023-01-06 (×2): qty 10.2, 30d supply, fill #0

## 2023-01-06 ENCOUNTER — Other Ambulatory Visit (HOSPITAL_BASED_OUTPATIENT_CLINIC_OR_DEPARTMENT_OTHER): Payer: Self-pay

## 2023-01-06 ENCOUNTER — Other Ambulatory Visit: Payer: Self-pay

## 2023-01-06 MED ORDER — ALBUTEROL SULFATE HFA 108 (90 BASE) MCG/ACT IN AERS
2.0000 | INHALATION_SPRAY | RESPIRATORY_TRACT | 0 refills | Status: AC | PRN
  Filled 2023-01-06: qty 6.7, 17d supply, fill #0

## 2023-01-06 MED ORDER — FLUTICASONE PROPIONATE HFA 44 MCG/ACT IN AERO
2.0000 | INHALATION_SPRAY | Freq: Two times a day (BID) | RESPIRATORY_TRACT | 5 refills | Status: AC | PRN
Start: 2023-01-06 — End: ?
  Filled 2023-01-06 – 2023-04-12 (×4): qty 10.6, 30d supply, fill #0

## 2023-01-07 ENCOUNTER — Other Ambulatory Visit (HOSPITAL_BASED_OUTPATIENT_CLINIC_OR_DEPARTMENT_OTHER): Payer: Self-pay

## 2023-01-11 DIAGNOSIS — M9903 Segmental and somatic dysfunction of lumbar region: Secondary | ICD-10-CM | POA: Diagnosis not present

## 2023-01-11 DIAGNOSIS — M5431 Sciatica, right side: Secondary | ICD-10-CM | POA: Diagnosis not present

## 2023-01-18 ENCOUNTER — Ambulatory Visit: Payer: PPO | Admitting: Podiatry

## 2023-01-18 ENCOUNTER — Other Ambulatory Visit (HOSPITAL_BASED_OUTPATIENT_CLINIC_OR_DEPARTMENT_OTHER): Payer: Self-pay

## 2023-01-19 ENCOUNTER — Ambulatory Visit: Payer: PPO | Admitting: Podiatry

## 2023-01-19 DIAGNOSIS — M2032 Hallux varus (acquired), left foot: Secondary | ICD-10-CM

## 2023-01-19 DIAGNOSIS — L84 Corns and callosities: Secondary | ICD-10-CM | POA: Diagnosis not present

## 2023-01-19 NOTE — Progress Notes (Signed)
Subjective: No chief complaint on file.    77 year old male presents the office today for follow evaluation of ulceration on his left big toe.  He states he thinks is that the last time the organ to see each other as he is doing well.  He keeps a Band-Aid on the area but no open reports and no drainage.  No swelling or redness.  No fevers or chills.  No other concerns.    Objective: AAO x3, NAD DP/PT pulses palpable bilaterally, CRT less than 3 seconds Plantar aspect left hallux with hyperkeratotic lesion.  There is some dried blood present on the callus but upon debridement the area is preulcerative but appears the ulceration is healed.  Is able to debride the majority the callus today without any complications or bleeding.  There is no new ulcerations present.  Hallux malleus is noted.   No pain with calf compression, swelling, warmth, erythema   Assessment: 77 year old male chronic ulceration left hallux-much improved  Plan: -All treatment options discussed with the patient including all alternatives, risks, complications.  -Separate hyperkeratotic lesions without any complications or bleeding.  The area is healed but is still preulcerative.  Still keep a bandage on this and offload.  Discussed shoe modifications and monitor closely for any signs or symptoms of recurrence or skin breakdown or infection.  Return if symptoms worsen or fail to improve.  Vivi Barrack DPM

## 2023-01-25 DIAGNOSIS — M9903 Segmental and somatic dysfunction of lumbar region: Secondary | ICD-10-CM | POA: Diagnosis not present

## 2023-01-25 DIAGNOSIS — M5431 Sciatica, right side: Secondary | ICD-10-CM | POA: Diagnosis not present

## 2023-01-27 ENCOUNTER — Ambulatory Visit: Payer: PPO | Admitting: Orthopaedic Surgery

## 2023-01-27 ENCOUNTER — Other Ambulatory Visit (INDEPENDENT_AMBULATORY_CARE_PROVIDER_SITE_OTHER): Payer: PPO

## 2023-01-27 ENCOUNTER — Encounter: Payer: Self-pay | Admitting: Orthopaedic Surgery

## 2023-01-27 DIAGNOSIS — Z96641 Presence of right artificial hip joint: Secondary | ICD-10-CM | POA: Diagnosis not present

## 2023-01-27 NOTE — Progress Notes (Signed)
The patient is a 77 year old gentleman who we replaced his right hip back in January of this year.  The hip has been doing well.  We did see him several weeks ago after he had been playing golf and had a mechanical fall landing on both of his knees.  X-rays of his knee showed tricompartment arthritis but no acute injury.  We aspirated just in a mild amount of fluid from both knees and placed a steroid injection both knees which he tolerated well.  He has no complaints.  He says his knees are doing okay.  On exam his right operative hip moves smoothly and fluidly with no blocks or rotation.  He is walking with a better gait.  His left hip exam is also normal.  An AP pelvis is reviewed today showing a well-seated right total hip arthroplasty with no complicating features.  There is no evidence of loosening.  His left hip joint space is well-maintained.  We did look at his preoperative x-rays are showing and the severity of his arthritis.  He says he is much better overall.  At this point follow-up for his hip can be as needed.  If he does develop any issues at all he knows to let us know.

## 2023-02-01 ENCOUNTER — Other Ambulatory Visit (HOSPITAL_BASED_OUTPATIENT_CLINIC_OR_DEPARTMENT_OTHER): Payer: Self-pay

## 2023-02-02 DIAGNOSIS — R262 Difficulty in walking, not elsewhere classified: Secondary | ICD-10-CM | POA: Diagnosis not present

## 2023-02-02 DIAGNOSIS — M40292 Other kyphosis, cervical region: Secondary | ICD-10-CM | POA: Diagnosis not present

## 2023-02-02 DIAGNOSIS — M25551 Pain in right hip: Secondary | ICD-10-CM | POA: Diagnosis not present

## 2023-02-02 DIAGNOSIS — M7542 Impingement syndrome of left shoulder: Secondary | ICD-10-CM | POA: Diagnosis not present

## 2023-02-02 DIAGNOSIS — M7541 Impingement syndrome of right shoulder: Secondary | ICD-10-CM | POA: Diagnosis not present

## 2023-02-02 DIAGNOSIS — M25651 Stiffness of right hip, not elsewhere classified: Secondary | ICD-10-CM | POA: Diagnosis not present

## 2023-02-02 DIAGNOSIS — Z96641 Presence of right artificial hip joint: Secondary | ICD-10-CM | POA: Diagnosis not present

## 2023-02-08 DIAGNOSIS — M5431 Sciatica, right side: Secondary | ICD-10-CM | POA: Diagnosis not present

## 2023-02-08 DIAGNOSIS — M9903 Segmental and somatic dysfunction of lumbar region: Secondary | ICD-10-CM | POA: Diagnosis not present

## 2023-02-11 ENCOUNTER — Other Ambulatory Visit (HOSPITAL_BASED_OUTPATIENT_CLINIC_OR_DEPARTMENT_OTHER): Payer: Self-pay

## 2023-02-11 MED ORDER — LEVOCETIRIZINE DIHYDROCHLORIDE 5 MG PO TABS
5.0000 mg | ORAL_TABLET | Freq: Every evening | ORAL | 5 refills | Status: DC
Start: 2023-02-11 — End: 2024-02-22
  Filled 2023-02-11: qty 30, 30d supply, fill #0
  Filled 2023-03-18: qty 30, 30d supply, fill #1
  Filled 2023-04-09 – 2023-04-21 (×2): qty 30, 30d supply, fill #2
  Filled 2023-05-27: qty 30, 30d supply, fill #3
  Filled 2023-07-03 – 2023-10-02 (×2): qty 30, 30d supply, fill #4
  Filled 2023-12-02: qty 30, 30d supply, fill #5

## 2023-02-13 ENCOUNTER — Other Ambulatory Visit (HOSPITAL_BASED_OUTPATIENT_CLINIC_OR_DEPARTMENT_OTHER): Payer: Self-pay

## 2023-02-17 ENCOUNTER — Other Ambulatory Visit (HOSPITAL_BASED_OUTPATIENT_CLINIC_OR_DEPARTMENT_OTHER): Payer: Self-pay

## 2023-02-22 DIAGNOSIS — M5431 Sciatica, right side: Secondary | ICD-10-CM | POA: Diagnosis not present

## 2023-02-22 DIAGNOSIS — M9903 Segmental and somatic dysfunction of lumbar region: Secondary | ICD-10-CM | POA: Diagnosis not present

## 2023-03-01 ENCOUNTER — Other Ambulatory Visit (HOSPITAL_BASED_OUTPATIENT_CLINIC_OR_DEPARTMENT_OTHER): Payer: Self-pay

## 2023-03-08 DIAGNOSIS — M5431 Sciatica, right side: Secondary | ICD-10-CM | POA: Diagnosis not present

## 2023-03-08 DIAGNOSIS — M9903 Segmental and somatic dysfunction of lumbar region: Secondary | ICD-10-CM | POA: Diagnosis not present

## 2023-03-08 DIAGNOSIS — R972 Elevated prostate specific antigen [PSA]: Secondary | ICD-10-CM | POA: Diagnosis not present

## 2023-03-15 ENCOUNTER — Other Ambulatory Visit (HOSPITAL_BASED_OUTPATIENT_CLINIC_OR_DEPARTMENT_OTHER): Payer: Self-pay

## 2023-03-15 DIAGNOSIS — R972 Elevated prostate specific antigen [PSA]: Secondary | ICD-10-CM | POA: Diagnosis not present

## 2023-03-15 DIAGNOSIS — R350 Frequency of micturition: Secondary | ICD-10-CM | POA: Diagnosis not present

## 2023-03-15 MED ORDER — GEMTESA 75 MG PO TABS
75.0000 mg | ORAL_TABLET | Freq: Every day | ORAL | 11 refills | Status: DC
  Filled 2023-03-15: qty 30, 30d supply, fill #0
  Filled 2023-04-09 – 2023-04-10 (×2): qty 30, 30d supply, fill #1
  Filled 2023-05-03: qty 30, 30d supply, fill #2
  Filled 2023-05-30: qty 30, 30d supply, fill #3
  Filled 2023-06-23: qty 30, 30d supply, fill #4
  Filled 2023-08-02 (×2): qty 30, 30d supply, fill #5
  Filled 2023-09-04 (×2): qty 30, 30d supply, fill #6
  Filled 2023-10-03: qty 30, 30d supply, fill #7
  Filled 2023-10-25 – 2023-10-26 (×2): qty 30, 30d supply, fill #8
  Filled 2023-12-02: qty 30, 30d supply, fill #9
  Filled 2023-12-30: qty 30, 30d supply, fill #10
  Filled 2024-01-27: qty 30, 30d supply, fill #11

## 2023-03-16 ENCOUNTER — Other Ambulatory Visit (HOSPITAL_BASED_OUTPATIENT_CLINIC_OR_DEPARTMENT_OTHER): Payer: Self-pay

## 2023-03-18 NOTE — Progress Notes (Signed)
Chief Complaint  Patient presents with   Follow-up    CAD   History of Present Illness: 77 yo male with history of CAD, DM, HLD, HTN and spinal stenosis here today for follow up. He has been followed in our office by Dr. Shari Prows. He had a NSTEMI in 2016 and had a drug eluting stent in the LAD. He had a NSTEMI in 2019 and found to have a severe distal RCA stenosis treated with a drug eluting stent. Normal ABI in 2021. Echo in 2019 with normal LV systolic function, no significant valve disease.   He is here today for follow up. The patient denies any chest pain, dyspnea, palpitations, lower extremity edema, orthopnea, PND, dizziness, near syncope or syncope.   Primary Care Physician: [name], MD   Past Medical History:  Diagnosis Date   CAD in native artery 03/01/2015   1. cath - DES to LAD residual non obstructive RCA disease in 2016 b. Cath s/p Successful PCI with stenting of the distal RCA into the PLOM with a DES. Balloon angioplasty of the PDA through the stent struts.    Diabetes mellitus without complication (HCC)    Edema of both lower extremities    Hyperlipidemia    Hypertension    Leg weakness    Overweight    Spinal stenosis    Ulcer of great toe [facility])     Past Surgical History:  Procedure Laterality Date   CARDIAC CATHETERIZATION N/A 03/01/2015   Procedure: Left Heart Cath and Coronary Angiography;  Surgeon: Kathleene Hazel, MD;  Location: [facility] INVASIVE CV LAB;  Service: Cardiovascular;  Laterality: N/A;   CORONARY STENT INTERVENTION N/A 09/28/2017   Procedure: CORONARY STENT INTERVENTION;  Surgeon: Swaziland, Peter M, MD;  Location: [facility] INVASIVE CV LAB;  Service: Cardiovascular;  Laterality: N/A;   LEFT HEART CATH AND CORONARY ANGIOGRAPHY N/A 09/28/2017   Procedure: LEFT HEART CATH AND CORONARY ANGIOGRAPHY;  Surgeon: Swaziland, Peter M, MD;  Location: [facility] INVASIVE CV LAB;  Service: Cardiovascular;  Laterality: N/A;   NO PAST SURGERIES     TOTAL HIP ARTHROPLASTY  Right 06/02/2022   Procedure: RIGHT TOTAL HIP ARTHROPLASTY;  Surgeon: Kathryne Hitch, MD;  Location: [facility] OR;  Service: Orthopedics;  Laterality: Right;    Current Outpatient Medications  Medication Sig Dispense Refill   albuterol (VENTOLIN HFA) 108 (90 Base) MCG/ACT inhaler Inhale 2 puffs into the lungs every 4 (four) hours as needed for cough/wheeze 6.7 g 0   aspirin 81 MG chewable tablet Chew 1 tablet (81 mg total) by mouth 2 (two) times daily. 30 tablet 0   bisoprolol (ZEBETA) 10 MG tablet Take 1 tablet (10 mg total) by mouth every morning. 90 tablet 1   chlorthalidone (HYGROTON) 25 MG tablet Take 1 tablet (25 mg total) by mouth daily. 90 tablet 1   fluticasone (FLOVENT HFA) 44 MCG/ACT inhaler Inhale 2 puffs into the lungs 2 (two) times any day that albuterol is needed 10.6 g 5   furosemide (LASIX) 40 MG tablet Take 1 tablet (40 mg total) by mouth daily as needed for fluid or edema. 30 tablet 4   GEMTESA 75 MG TABS Take 75 mg by mouth in the morning.     ipratropium (ATROVENT) 0.03 % nasal spray Place 1-2 sprays into both nostrils 4 (four) times daily as needed for watery nasal discharge 30 mL 5   levocetirizine (XYZAL) 5 MG tablet Take 1 tablet (5 mg total) by mouth every evening. 30 tablet  5   lisinopril (ZESTRIL) 40 MG tablet Take 1 tablet (40 mg total) by mouth daily. 90 tablet 1   Multiple Vitamin (MULTIVITAMIN WITH MINERALS) TABS tablet Take 1 tablet by mouth in the morning. Men's 50+ Complete Multivitamin     naproxen sodium (ALEVE) 220 MG tablet Take 440 mg by mouth daily as needed (pain.).     pantoprazole (PROTONIX) 40 MG tablet Take 1 tablet (40 mg total) by mouth daily. 90 tablet 1   rosuvastatin (CRESTOR) 10 MG tablet Take 1 tablet (10 mg total) by mouth daily. 90 tablet 1   Semaglutide, 2 MG/DOSE, (OZEMPIC, 2 MG/DOSE,) 8 MG/3ML SOPN Inject 2 mg into the skin once a week. 3 mL 11   tamsulosin (FLOMAX) 0.4 MG CAPS capsule Take 0.4 mg by mouth in the morning.     Vibegron  (GEMTESA) 75 MG TABS Take 1 tablet (75 mg total) by mouth daily. 30 tablet 11   No current facility-administered medications for this visit.    Allergies  Allergen Reactions   Prednisone Anxiety and Other (See Comments)    Makes him crazy   Atorvastatin Other (See Comments)    Pt reports "causes lower extremity muscle aches."   Pravastatin Other (See Comments)    Pt reports "causes bilateral calf cramping."    Social History   Socioeconomic History   Marital status: Divorced    Spouse name: Not on file   Number of children: Not on file   Years of education: Not on file   Highest education level: Not on file  Occupational History   Not on file  Tobacco Use   Smoking status: Former    Current packs/day: 0.00    Types: Cigarettes    Quit date: 07/03/1983    Years since quitting: 39.7   Smokeless tobacco: Never   Tobacco comments:    quit in Mar 1985  Vaping Use   Vaping status: Never Used  Substance and Sexual Activity   Alcohol use: Yes    Alcohol/week: 0.0 standard drinks of alcohol    Comment: "one or two drinks a night"   Drug use: No   Sexual activity: Not on file  Other Topics Concern   Not on file  Social History Narrative   Not on file   Social Determinants of Health   Financial Resource Strain: Not on file  Food Insecurity: Not on file  Transportation Needs: Not on file  Physical Activity: Not on file  Stress: Not on file  Social Connections: Not on file  Intimate Partner Violence: Not on file    Family History  Problem Relation Age of Onset   Thyroid cancer Mother    Liver cancer Father        pancreatic   Arrhythmia Sister     Review of Systems:  As stated in the HPI and otherwise negative.   BP 138/86   Pulse 75   Ht 5\' 11"  (1.803 m)   Wt 111.6 kg   SpO2 97%   BMI 34.31 kg/m   Physical Examination: General: Well developed, well nourished, NAD  HEENT: OP clear, mucus membranes moist  SKIN: warm, dry. No rashes. Neuro: No focal  deficits  Musculoskeletal: Muscle strength 5/5 all ext  Psychiatric: Mood and affect normal  Neck: No JVD, no carotid bruits, no thyromegaly, no lymphadenopathy.  Lungs:Clear bilaterally, no wheezes, rhonci, crackles Cardiovascular: Regular rate and rhythm. No murmurs, gallops or rubs. Abdomen:Soft. Bowel sounds present. Non-tender.  Extremities: No lower extremity  edema. Pulses are 2 + in the bilateral DP/PT.  EKG:  EKG is ordered today. The ekg ordered today demonstrates  EKG Interpretation Date/Time:  Friday March 19 2023 08:06:39 EST Ventricular Rate:  70 PR Interval:  214 QRS Duration:  98 QT Interval:  362 QTC Calculation: 390 R Axis:   30  Text Interpretation: Sinus rhythm with sinus arrhythmia with 1st degree A-V block When compared with ECG of 29-Sep-2017 07:51, No significant change was found Confirmed by Verne Carrow [phone]) on 03/19/2023 8:09:08 AM    Recent Labs: 05/29/2022: ALT 20 06/03/2022: BUN 20; Creatinine, Ser 1.34; Hemoglobin 11.4; Platelets 219; Potassium 3.5; Sodium 134   Lipid Panel    Component Value Date/Time   CHOL 122 03/11/2022 0838   TRIG 126 03/11/2022 0838   HDL 30 (L) 03/11/2022 0838   CHOLHDL 4.1 03/11/2022 0838   CHOLHDL 5.4 09/27/2017 0222   VLDL UNABLE TO CALCULATE IF TRIGLYCERIDE OVER 400 mg/dL 60/45/4098 1191   LDLCALC 69 03/11/2022 0838     Wt Readings from Last 3 Encounters:  03/19/23 111.6 kg  06/23/22 107 kg  06/02/22 107 kg    Assessment and Plan:   1. CAD without angina: No chest pain suggestive of angina. Continue ASA, statin and beta blocker.   2. HTN: BP is well controlled. No changes  3. Hyperlipidemia: LDL at goal in November 2023. Continue statin. Check lipids and LFTs today.   Labs/ tests ordered today include:  Orders Placed This Encounter  Procedures   Lipid Profile   Hepatic function panel   EKG 12-Lead   Disposition:   F/U with me in one year   Signed, Verne Carrow, MD,  [facility] 03/19/2023 8:22 AM    [facility] [address] [place], [place], [place]  [address] Phone: [phone]; Fax: [phone]

## 2023-03-19 ENCOUNTER — Encounter: Payer: Self-pay | Admitting: Cardiovascular Disease

## 2023-03-19 ENCOUNTER — Ambulatory Visit: Payer: PPO | Attending: Cardiovascular Disease | Admitting: Cardiovascular Disease

## 2023-03-19 VITALS — BP 138/86 | HR 75 | Ht 71.0 in | Wt 246.0 lb

## 2023-03-19 DIAGNOSIS — E785 Hyperlipidemia, unspecified: Secondary | ICD-10-CM

## 2023-03-19 DIAGNOSIS — I2510 Atherosclerotic heart disease of native coronary artery without angina pectoris: Secondary | ICD-10-CM | POA: Diagnosis not present

## 2023-03-19 DIAGNOSIS — I10 Essential (primary) hypertension: Secondary | ICD-10-CM

## 2023-03-19 NOTE — Patient Instructions (Addendum)
Medication Instructions:  No changes *If you need a refill on your cardiac medications before your next appointment, please call your pharmacy*   Lab Work: Today: lipids/liver   Testing/Procedures: none   Follow-Up: At [facility], you and your health needs are our priority.  As part of our continuing mission to provide you with exceptional heart care, we have created designated Provider Care Teams.  These Care Teams include your primary Cardiologist (physician) and Advanced Practice Providers (APPs -  Physician Assistants and Nurse Practitioners) who all work together to provide you with the care you need, when you need it.   Your next appointment:   12 month(s)  Provider:   Verne Carrow, MD

## 2023-03-20 ENCOUNTER — Other Ambulatory Visit (HOSPITAL_BASED_OUTPATIENT_CLINIC_OR_DEPARTMENT_OTHER): Payer: Self-pay

## 2023-03-20 LAB — LIPID PANEL
Chol/HDL Ratio: 4.3 ratio (ref 0.0–5.0)
Cholesterol, Total: 139 mg/dL (ref 100–199)
HDL: 32 mg/dL — ABNORMAL LOW (ref >39)
LDL Chol Calc (NIH): 72 mg/dL (ref 0–99)
Triglycerides: 212 mg/dL — ABNORMAL HIGH (ref 0–149)
VLDL Cholesterol Cal: 35 mg/dL (ref 5–40)

## 2023-03-20 LAB — HEPATIC FUNCTION PANEL
ALT: 19 [IU]/L (ref 0–44)
AST: 17 [IU]/L (ref 0–40)
Albumin: 4.1 g/dL (ref 3.8–4.8)
Alkaline Phosphatase: 88 [IU]/L (ref 44–121)
Bilirubin Total: 0.4 mg/dL (ref 0.0–1.2)
Bilirubin, Direct: 0.17 mg/dL (ref 0.00–0.40)
Total Protein: 7 g/dL (ref 6.0–8.5)

## 2023-03-26 DIAGNOSIS — R972 Elevated prostate specific antigen [PSA]: Secondary | ICD-10-CM | POA: Diagnosis not present

## 2023-03-26 DIAGNOSIS — E1169 Type 2 diabetes mellitus with other specified complication: Secondary | ICD-10-CM | POA: Diagnosis not present

## 2023-03-26 DIAGNOSIS — Z0000 Encounter for general adult medical examination without abnormal findings: Secondary | ICD-10-CM | POA: Diagnosis not present

## 2023-03-26 DIAGNOSIS — Z23 Encounter for immunization: Secondary | ICD-10-CM | POA: Diagnosis not present

## 2023-03-26 DIAGNOSIS — I2510 Atherosclerotic heart disease of native coronary artery without angina pectoris: Secondary | ICD-10-CM | POA: Diagnosis not present

## 2023-03-26 DIAGNOSIS — I10 Essential (primary) hypertension: Secondary | ICD-10-CM | POA: Diagnosis not present

## 2023-03-26 DIAGNOSIS — E785 Hyperlipidemia, unspecified: Secondary | ICD-10-CM | POA: Diagnosis not present

## 2023-03-29 DIAGNOSIS — M5431 Sciatica, right side: Secondary | ICD-10-CM | POA: Diagnosis not present

## 2023-03-29 DIAGNOSIS — M9903 Segmental and somatic dysfunction of lumbar region: Secondary | ICD-10-CM | POA: Diagnosis not present

## 2023-04-06 ENCOUNTER — Ambulatory Visit: Payer: PPO | Admitting: Cardiovascular Disease

## 2023-04-09 ENCOUNTER — Other Ambulatory Visit: Payer: Self-pay

## 2023-04-09 ENCOUNTER — Other Ambulatory Visit (HOSPITAL_BASED_OUTPATIENT_CLINIC_OR_DEPARTMENT_OTHER): Payer: Self-pay

## 2023-04-10 ENCOUNTER — Other Ambulatory Visit (HOSPITAL_BASED_OUTPATIENT_CLINIC_OR_DEPARTMENT_OTHER): Payer: Self-pay

## 2023-04-12 ENCOUNTER — Other Ambulatory Visit: Payer: Self-pay

## 2023-04-12 ENCOUNTER — Other Ambulatory Visit (HOSPITAL_BASED_OUTPATIENT_CLINIC_OR_DEPARTMENT_OTHER): Payer: Self-pay

## 2023-04-14 ENCOUNTER — Other Ambulatory Visit (HOSPITAL_BASED_OUTPATIENT_CLINIC_OR_DEPARTMENT_OTHER): Payer: Self-pay

## 2023-04-15 ENCOUNTER — Ambulatory Visit: Payer: PPO | Admitting: Physician Assistant

## 2023-04-17 ENCOUNTER — Other Ambulatory Visit (HOSPITAL_BASED_OUTPATIENT_CLINIC_OR_DEPARTMENT_OTHER): Payer: Self-pay

## 2023-04-19 ENCOUNTER — Encounter: Payer: Self-pay | Admitting: Physician Assistant

## 2023-04-19 ENCOUNTER — Ambulatory Visit: Payer: PPO | Admitting: Physician Assistant

## 2023-04-19 DIAGNOSIS — M1712 Unilateral primary osteoarthritis, left knee: Secondary | ICD-10-CM

## 2023-04-19 MED ORDER — LIDOCAINE HCL 1 % IJ SOLN
5.0000 mL | INTRAMUSCULAR | Status: AC | PRN
Start: 2023-04-19 — End: 2023-04-19
  Administered 2023-04-19: 5 mL

## 2023-04-19 MED ORDER — METHYLPREDNISOLONE ACETATE 40 MG/ML IJ SUSP
40.0000 mg | INTRAMUSCULAR | Status: AC | PRN
Start: 2023-04-19 — End: 2023-04-19
  Administered 2023-04-19: 40 mg via INTRA_ARTICULAR

## 2023-04-19 NOTE — Progress Notes (Signed)
HPI: Alex Jackson comes in today due to left knee pain pain posterior aspect of the knee.  He has had no known injury.  Pains been worse over the last month.  Denies any chest pain shortness of breath.  He has known tricompartmental arthritis of both knees.  Review of systems: Negative for fevers chills or ongoing infection.  Physical exam: General Well-developed well-nourished male no acute distress. Respirations: Unlabored Psych: Alert and oriented x 3 Bilateral knees: Left knee positive effusion no abnormal warmth erythema.  Tenderness medial lateral joint line.  Slight tenderness posterior aspect of the knee.  Full extension both knees.  Full flexion both knees.  Right knee no abnormal warmth erythema or effusion.  Calfs are supple nontender.  Lower extremities without significant edema.  Pression: Left knee effusion Tricompartmental arthritis left knee  Plan: Discussed with him aspiration and injection of cortisone he was agreeable.  Postinjection/aspiration his pain in the back of the knee was much improved.  He continues to have any pain in the calf or develops any swelling call our office and we will obtain a Doppler ultrasound to rule out DVT.  Otherwise he will follow-up with [place] as needed.  Questions were encouraged and answered at length.     Procedure Note  Patient: Alex Jackson             Date of Birth: 11/28/1945           MRN: [id number]             Visit Date: 04/19/2023  Procedures: Visit Diagnoses: No diagnosis found.  Large Joint Inj on 04/19/2023 5:24 PM Indications: pain Details: 22 G 1.5 in needle, superolateral approach  Arthrogram: No  Medications: 5 mL lidocaine 1 %; 40 mg methylPREDNISolone acetate 40 MG/ML Aspirate: 10 mL yellow and blood-tinged Outcome: tolerated well, no immediate complications Procedure, treatment alternatives, risks and benefits explained, specific risks discussed. Consent was given by the patient. Immediately prior to procedure a  time out was called to verify the correct patient, procedure, equipment, support staff and site/side marked as required. Patient was prepped and draped in the usual sterile fashion.

## 2023-04-23 ENCOUNTER — Other Ambulatory Visit (HOSPITAL_BASED_OUTPATIENT_CLINIC_OR_DEPARTMENT_OTHER): Payer: Self-pay

## 2023-04-26 ENCOUNTER — Other Ambulatory Visit (HOSPITAL_BASED_OUTPATIENT_CLINIC_OR_DEPARTMENT_OTHER): Payer: Self-pay

## 2023-04-26 DIAGNOSIS — M5431 Sciatica, right side: Secondary | ICD-10-CM | POA: Diagnosis not present

## 2023-04-26 DIAGNOSIS — M9903 Segmental and somatic dysfunction of lumbar region: Secondary | ICD-10-CM | POA: Diagnosis not present

## 2023-04-27 ENCOUNTER — Other Ambulatory Visit (HOSPITAL_BASED_OUTPATIENT_CLINIC_OR_DEPARTMENT_OTHER): Payer: Self-pay

## 2023-05-03 ENCOUNTER — Other Ambulatory Visit: Payer: Self-pay

## 2023-05-03 ENCOUNTER — Other Ambulatory Visit (HOSPITAL_BASED_OUTPATIENT_CLINIC_OR_DEPARTMENT_OTHER): Payer: Self-pay

## 2023-05-04 ENCOUNTER — Other Ambulatory Visit (HOSPITAL_BASED_OUTPATIENT_CLINIC_OR_DEPARTMENT_OTHER): Payer: Self-pay

## 2023-05-07 IMAGING — MR MR FOOT*L* WO/W CM
8 series · 39 of 40 positions shown · IV contrast (gadavist)
Comparison: None.

CLINICAL DATA: Diabetic foot ulcer of the left foot. Evaluate for
osteomyelitis.

EXAM:
MRI OF THE LEFT FOREFOOT WITHOUT AND WITH CONTRAST
TECHNIQUE: Multiplanar, multisequence MR imaging of the left forefoot was
performed both before and after administration of intravenous
contrast.
CONTRAST:  10mL GADAVIST GADOBUTROL 1 MMOL/ML IV SOLN

[Series 5: T2 fat-sat · coronal · left · 3.0mm · 0.38mm/px · 7 of 35 slices shown (1 of 2)]
[im 1/35]
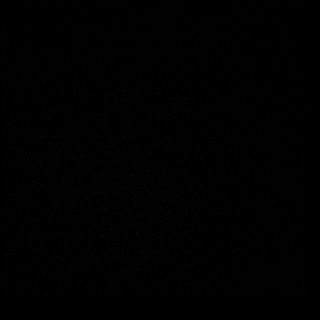
[im 6/35]
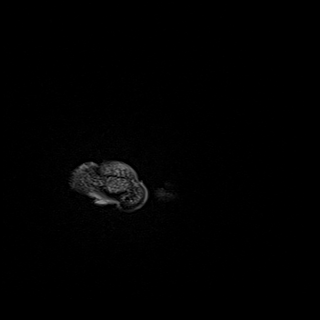
[im 12/35]
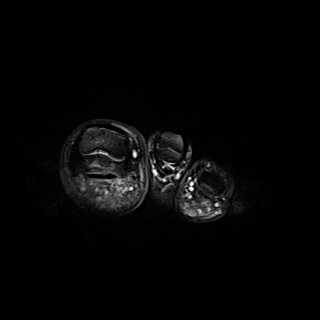
[im 18/35]
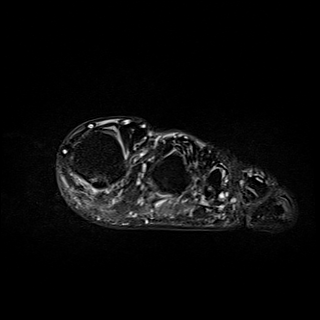
[im 23/35]
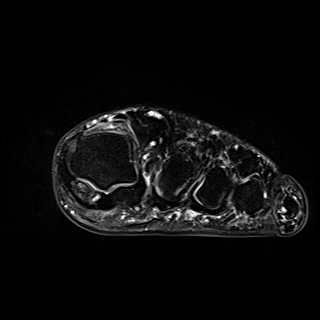
[im 29/35]
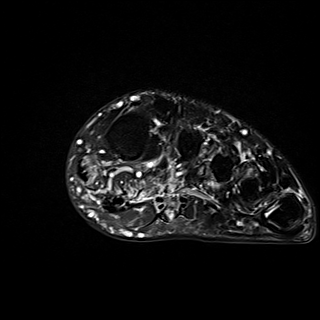
[im 35/35]
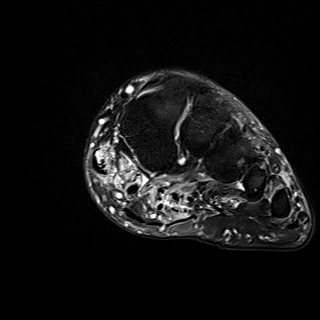

[Series 6: T2 fat-sat · axial · left · 3.0mm · 0.66mm/px · z∈[-97,-12]mm · 4 of 22 slices shown (2 of 2)]
[im 1/22]
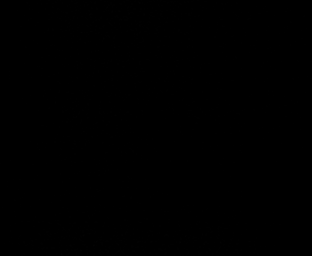
[im 8/22]
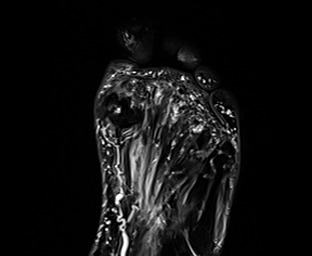
[im 15/22]
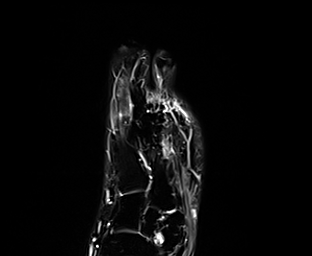
[im 22/22]
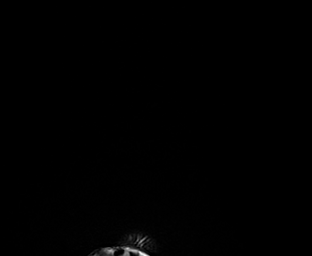

[Series 7: T1 · axial · left · 3.0mm · 0.64mm/px · z∈[-97,-12]mm · 4 of 22 slices shown]
[im 1/22]
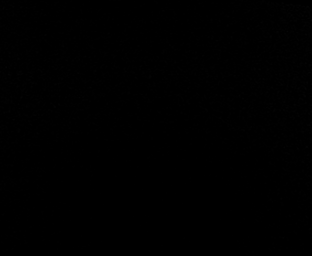
[im 8/22]
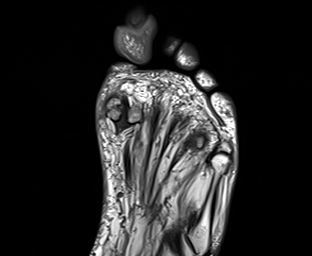
[im 15/22]
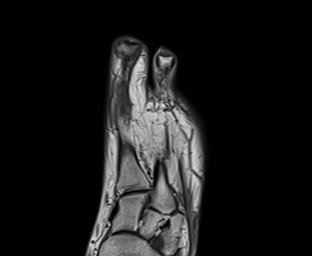
[im 22/22]
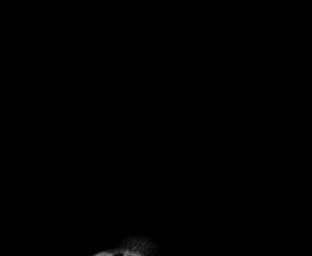

[Series 8: STIR · sagittal · left · 3.0mm · 0.35mm/px · 4 of 27 slices shown]
[im 1/27]
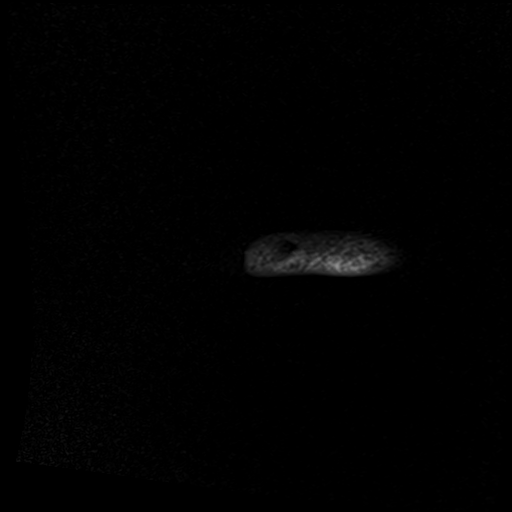
[im 7/27]
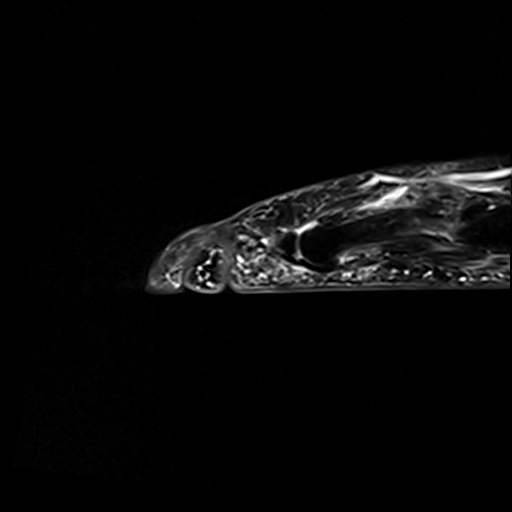
[im 14/27]
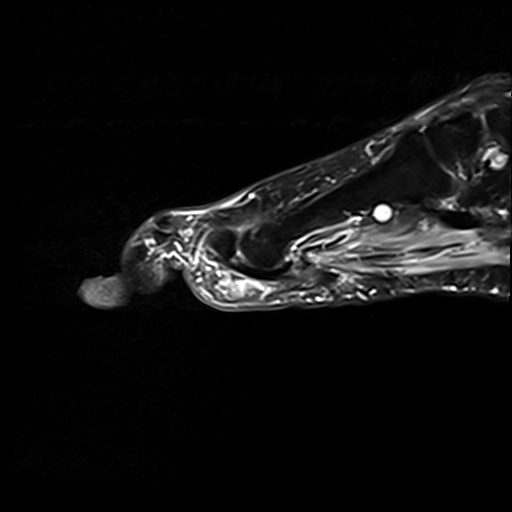
[im 20/27]
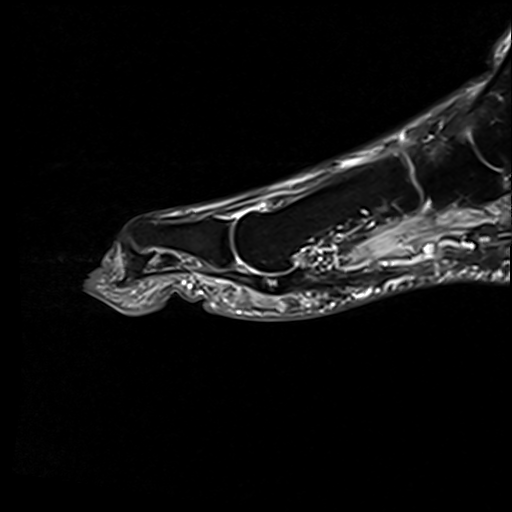

[Series 9: T1 fat-sat · coronal · non-contrast · left · 3.0mm · 0.47mm/px · 6 of 33 slices shown]
[im 1/33]
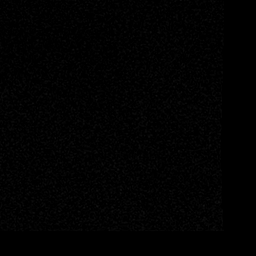
[im 7/33]
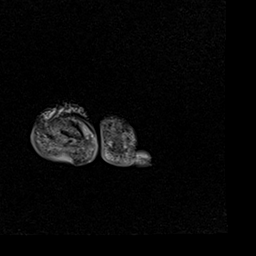
[im 13/33]
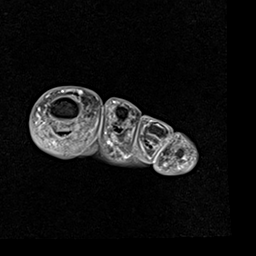
[im 20/33]
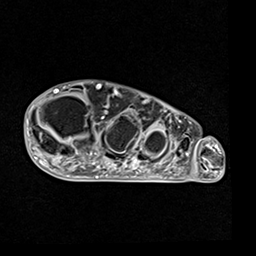
[im 26/33]
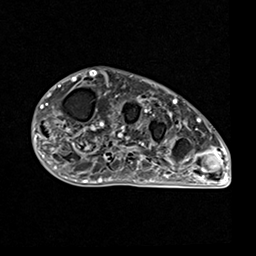
[im 33/33]
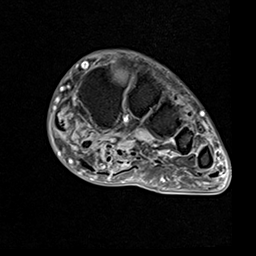

[Series 10: T1 fat-sat post-contrast · coronal · left · 3.0mm · 0.47mm/px · 6 of 33 slices shown (1 of 3)]
[im 1/33]
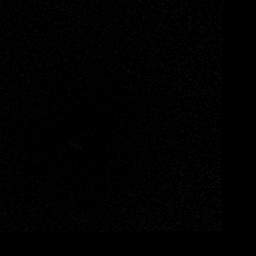
[im 7/33]
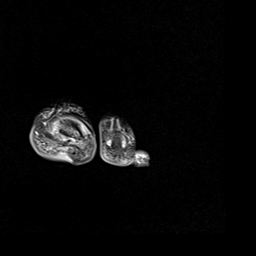
[im 13/33]
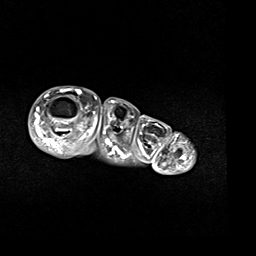
[im 20/33]
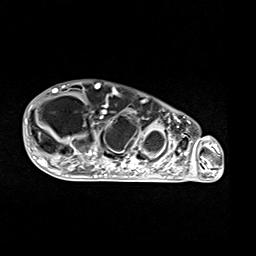
[im 26/33]
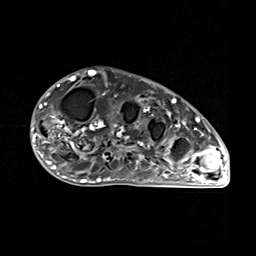
[im 33/33]
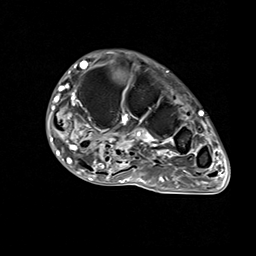

[Series 11: T1 fat-sat post-contrast · sagittal · left · 3.0mm · 0.35mm/px · 4 of 23 slices shown (2 of 3)]
[im 1/23]
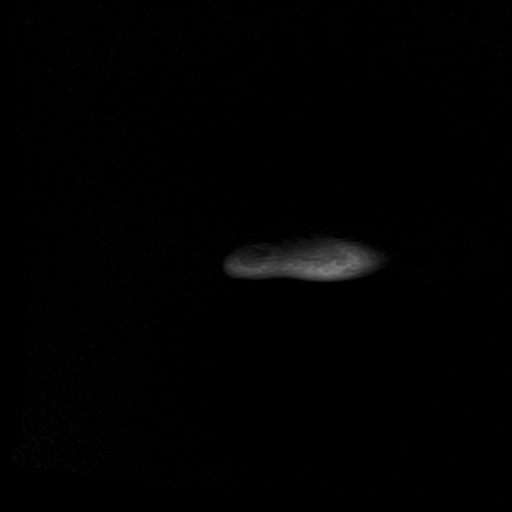
[im 8/23]
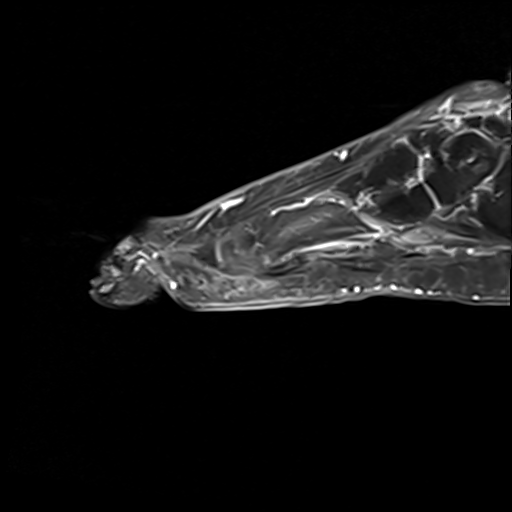
[im 15/23]
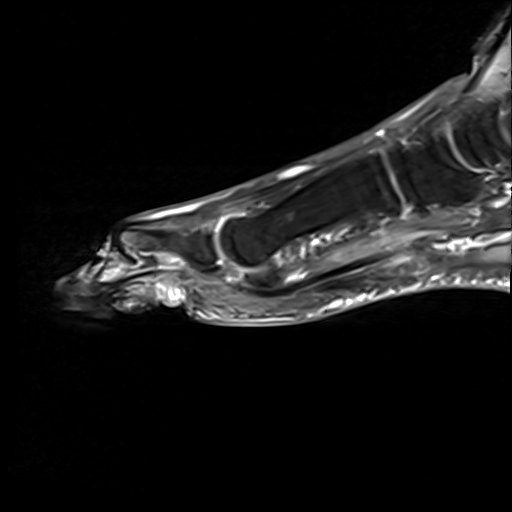
[im 23/23]
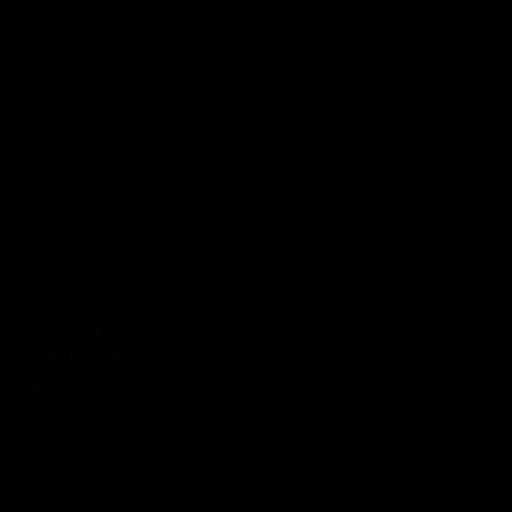

[Series 12: T1 fat-sat post-contrast · axial · left · 3.0mm · 0.56mm/px · z∈[-98,-14]mm · 4 of 22 slices shown (3 of 3)]
[im 1/22]
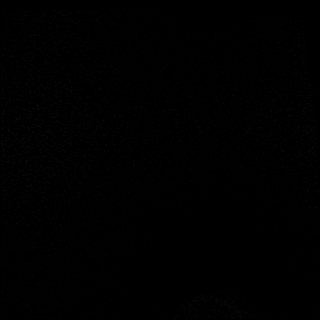
[im 8/22]
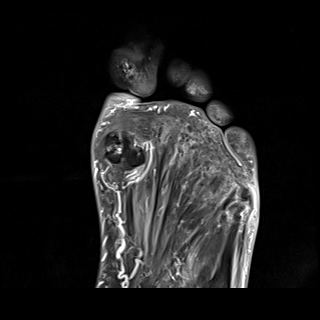
[im 15/22]
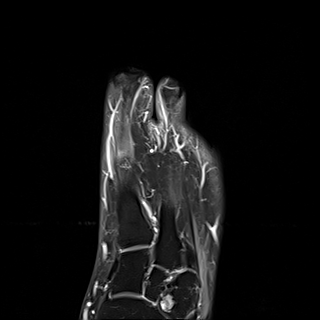
[im 22/22]
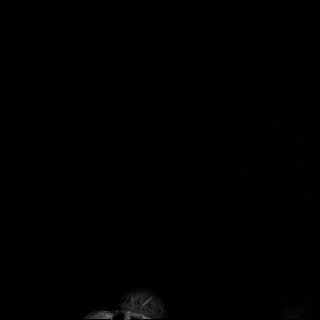

[39 of 40 positions shown; findings below may reference images not displayed]

FINDINGS: Bones/Joint/Cartilage

No fracture or dislocation. Normal alignment. No joint effusion.

Soft tissue ulcer at the tip of the great toe. Bone marrow edema in
the distal aspect of the first distal phalanx concerning for
osteomyelitis.

Mild osteoarthritis of the first MTP joint. Focal T2 signal in the
proximal medial cuboid with stippled internal low signal suggestive
of a benign chondroid lesion such as an enchondroma.

Bipartite medial hallux sesamoid with marrow edema as can be seen
with sesamoiditis.

Ligaments

Collateral ligaments are intact.  Lisfranc ligament is intact.

Muscles and Tendons

Flexor, peroneal and extensor compartment tendons are intact. Mild
generalized muscle atrophy. T2 hyperintense signal involving the
plantar musculature likely neurogenic.

Soft tissue
No fluid collection or hematoma.  No soft tissue mass.
IMPRESSION: 1. Soft tissue ulcer at the tip of the great toe. Bone marrow edema
in the distal aspect of the first distal phalanx concerning for
osteomyelitis. No drainable fluid collection to suggest an abscess.
2. Bipartite medial hallux sesamoid with marrow edema as can be seen
with sesamoiditis.

## 2023-05-10 ENCOUNTER — Other Ambulatory Visit (HOSPITAL_BASED_OUTPATIENT_CLINIC_OR_DEPARTMENT_OTHER): Payer: Self-pay

## 2023-05-18 ENCOUNTER — Other Ambulatory Visit (HOSPITAL_BASED_OUTPATIENT_CLINIC_OR_DEPARTMENT_OTHER): Payer: Self-pay

## 2023-05-27 ENCOUNTER — Other Ambulatory Visit (HOSPITAL_BASED_OUTPATIENT_CLINIC_OR_DEPARTMENT_OTHER): Payer: Self-pay

## 2023-07-03 ENCOUNTER — Other Ambulatory Visit: Payer: Self-pay | Admitting: Nurse Practitioner

## 2023-07-03 ENCOUNTER — Other Ambulatory Visit: Payer: Self-pay | Admitting: Cardiovascular Disease

## 2023-07-03 DIAGNOSIS — I10 Essential (primary) hypertension: Secondary | ICD-10-CM

## 2023-07-03 DIAGNOSIS — I2510 Atherosclerotic heart disease of native coronary artery without angina pectoris: Secondary | ICD-10-CM

## 2023-07-03 DIAGNOSIS — E785 Hyperlipidemia, unspecified: Secondary | ICD-10-CM

## 2023-07-03 DIAGNOSIS — I255 Ischemic cardiomyopathy: Secondary | ICD-10-CM

## 2023-07-03 DIAGNOSIS — I25110 Atherosclerotic heart disease of native coronary artery with unstable angina pectoris: Secondary | ICD-10-CM

## 2023-07-03 DIAGNOSIS — Z955 Presence of coronary angioplasty implant and graft: Secondary | ICD-10-CM

## 2023-07-05 ENCOUNTER — Other Ambulatory Visit: Payer: Self-pay

## 2023-07-05 ENCOUNTER — Other Ambulatory Visit (HOSPITAL_BASED_OUTPATIENT_CLINIC_OR_DEPARTMENT_OTHER): Payer: Self-pay

## 2023-07-05 MED ORDER — LISINOPRIL 40 MG PO TABS
40.0000 mg | ORAL_TABLET | Freq: Every day | ORAL | 3 refills | Status: DC
  Filled 2023-07-05: qty 90, 90d supply, fill #0
  Filled 2023-10-02: qty 90, 90d supply, fill #1
  Filled 2024-01-01: qty 90, 90d supply, fill #2
  Filled 2024-04-08: qty 90, 90d supply, fill #3

## 2023-07-05 MED ORDER — CHLORTHALIDONE 25 MG PO TABS
25.0000 mg | ORAL_TABLET | Freq: Every day | ORAL | 3 refills | Status: DC
  Filled 2023-07-05: qty 90, 90d supply, fill #0
  Filled 2023-10-02: qty 90, 90d supply, fill #1
  Filled 2024-01-01: qty 90, 90d supply, fill #2
  Filled 2024-04-08: qty 90, 90d supply, fill #3

## 2023-07-05 MED ORDER — ROSUVASTATIN CALCIUM 10 MG PO TABS
10.0000 mg | ORAL_TABLET | Freq: Every day | ORAL | 2 refills | Status: DC
  Filled 2023-07-05: qty 90, 90d supply, fill #0
  Filled 2023-10-02: qty 90, 90d supply, fill #1
  Filled 2024-01-01: qty 90, 90d supply, fill #2

## 2023-07-05 MED ORDER — BISOPROLOL FUMARATE 10 MG PO TABS
10.0000 mg | ORAL_TABLET | Freq: Every morning | ORAL | 2 refills | Status: DC
  Filled 2023-07-05: qty 90, 90d supply, fill #0
  Filled 2023-10-04: qty 90, 90d supply, fill #1
  Filled 2024-01-04: qty 90, 90d supply, fill #2

## 2023-07-05 MED ORDER — PANTOPRAZOLE SODIUM 40 MG PO TBEC
40.0000 mg | DELAYED_RELEASE_TABLET | Freq: Every day | ORAL | 3 refills | Status: DC
  Filled 2023-07-05: qty 90, 90d supply, fill #0
  Filled 2023-10-02: qty 90, 90d supply, fill #1
  Filled 2024-01-01: qty 90, 90d supply, fill #2
  Filled 2024-04-08: qty 90, 90d supply, fill #3

## 2023-07-07 ENCOUNTER — Other Ambulatory Visit (HOSPITAL_BASED_OUTPATIENT_CLINIC_OR_DEPARTMENT_OTHER): Payer: Self-pay

## 2023-07-13 ENCOUNTER — Ambulatory Visit: Admitting: Physician Assistant

## 2023-07-13 ENCOUNTER — Telehealth: Payer: Self-pay | Admitting: *Deleted

## 2023-07-13 ENCOUNTER — Encounter: Payer: Self-pay | Admitting: Physician Assistant

## 2023-07-13 ENCOUNTER — Other Ambulatory Visit (INDEPENDENT_AMBULATORY_CARE_PROVIDER_SITE_OTHER): Payer: Self-pay

## 2023-07-13 DIAGNOSIS — M25562 Pain in left knee: Secondary | ICD-10-CM

## 2023-07-13 DIAGNOSIS — G8929 Other chronic pain: Secondary | ICD-10-CM | POA: Diagnosis not present

## 2023-07-13 DIAGNOSIS — M1712 Unilateral primary osteoarthritis, left knee: Secondary | ICD-10-CM

## 2023-07-13 MED ORDER — LIDOCAINE HCL 1 % IJ SOLN
3.0000 mL | INTRAMUSCULAR | Status: AC | PRN
  Administered 2023-07-13: 3 mL

## 2023-07-13 MED ORDER — METHYLPREDNISOLONE ACETATE 40 MG/ML IJ SUSP
40.0000 mg | INTRAMUSCULAR | Status: AC | PRN
  Administered 2023-07-13: 40 mg via INTRA_ARTICULAR

## 2023-07-13 MED ORDER — LIDOCAINE HCL 2 % IJ SOLN
4.0000 mg | INTRAMUSCULAR | Status: AC | PRN
  Administered 2023-07-13: 4 mg

## 2023-07-13 NOTE — Addendum Note (Signed)
 Addended by: Richardean Canal on: 07/13/2023 06:16 PM   Modules accepted: Orders

## 2023-07-13 NOTE — Progress Notes (Addendum)
 HPI: [name] comes in today due to left knee pain.  He last had a cortisone injection in the knee 04/19/2023.  States that has helped in the home about 3 weeks ago.  No known injury.  No fevers chills.  Has known tricompartmental arthritis right knee.  History of right total hip replacements 06/02/2022 by Dr. Magnus Ivan.  Hips doing well.  He has taken Aleve, ibuprofen at different times for the left knee pain.  Review of systems: See HPI otherwise negative or noncontributory.  Physical exam: General: Well-developed well-nourished pleasant male in no acute distress. Bilateral knees: Left knee slight effusion right knee no effusion.  No abnormal warmth or erythema of either knee.  He has good range of motion of both knees.  Radiographs:Left knee 2 views: No acute fracture.  Knee is well located.  Slightly increased narrowing lateral joint line and patellofemoral arthritic changes with osteophytes that has slightly increased compared to prior films.  Mild changes medial joint line.  Impression: Left knee tricompartmental arthritis  Plan: Given his continued left knee pain and tricompartmental arthritis which is failed conservative treatment which is included over-the-counter NSAIDs exercises and cortisone injections recommend viscosupplementation.  He has no planned surgery for the left knee in the next 6 months.  Will try to gain approval for supplemental injection left knee and then have him back once this is available.  Questions were encouraged and answered at length.    Procedure Note  Patient: Alex Jackson             Date of Birth: 1945/10/21           MRN: [id number]             Visit Date: 07/13/2023  Procedures: Visit Diagnoses:  1. Chronic pain of left knee   2. Primary osteoarthritis of left knee     Large Joint Inj: L knee on 07/13/2023 6:13 PM Indications: pain Details: 22 G 1.5 in needle, superolateral approach  Arthrogram: No  Medications: 3 mL lidocaine 1 %; 40 mg  methylPREDNISolone acetate 40 MG/ML; 4 mg lidocaine 2 % Aspirate: 20 mL yellow and blood-tinged Outcome: tolerated well, no immediate complications Procedure, treatment alternatives, risks and benefits explained, specific risks discussed. Consent was given by the patient. Immediately prior to procedure a time out was called to verify the correct patient, procedure, equipment, support staff and site/side marked as required. Patient was prepped and draped in the usual sterile fashion.

## 2023-07-13 NOTE — Telephone Encounter (Signed)
1 year Ortho bundle in office visit completed.

## 2023-07-14 ENCOUNTER — Telehealth: Payer: Self-pay

## 2023-07-14 NOTE — Telephone Encounter (Signed)
 Left knee gel injection ?

## 2023-07-19 DIAGNOSIS — M5431 Sciatica, right side: Secondary | ICD-10-CM | POA: Diagnosis not present

## 2023-07-19 DIAGNOSIS — M9903 Segmental and somatic dysfunction of lumbar region: Secondary | ICD-10-CM | POA: Diagnosis not present

## 2023-07-20 DIAGNOSIS — M25562 Pain in left knee: Secondary | ICD-10-CM | POA: Diagnosis not present

## 2023-07-20 DIAGNOSIS — M1712 Unilateral primary osteoarthritis, left knee: Secondary | ICD-10-CM | POA: Diagnosis not present

## 2023-07-20 DIAGNOSIS — R262 Difficulty in walking, not elsewhere classified: Secondary | ICD-10-CM | POA: Diagnosis not present

## 2023-07-26 ENCOUNTER — Other Ambulatory Visit (HOSPITAL_BASED_OUTPATIENT_CLINIC_OR_DEPARTMENT_OTHER): Payer: Self-pay

## 2023-07-26 NOTE — Telephone Encounter (Signed)
Noted. Will call to schedule

## 2023-07-27 DIAGNOSIS — M25562 Pain in left knee: Secondary | ICD-10-CM | POA: Diagnosis not present

## 2023-08-02 ENCOUNTER — Other Ambulatory Visit (HOSPITAL_BASED_OUTPATIENT_CLINIC_OR_DEPARTMENT_OTHER): Payer: Self-pay

## 2023-08-02 ENCOUNTER — Other Ambulatory Visit: Payer: Self-pay

## 2023-08-03 DIAGNOSIS — M25562 Pain in left knee: Secondary | ICD-10-CM | POA: Diagnosis not present

## 2023-08-05 DIAGNOSIS — M25562 Pain in left knee: Secondary | ICD-10-CM | POA: Diagnosis not present

## 2023-08-09 ENCOUNTER — Other Ambulatory Visit: Payer: Self-pay

## 2023-08-09 DIAGNOSIS — M9903 Segmental and somatic dysfunction of lumbar region: Secondary | ICD-10-CM | POA: Diagnosis not present

## 2023-08-09 DIAGNOSIS — M1712 Unilateral primary osteoarthritis, left knee: Secondary | ICD-10-CM

## 2023-08-09 DIAGNOSIS — M5431 Sciatica, right side: Secondary | ICD-10-CM | POA: Diagnosis not present

## 2023-08-09 NOTE — Telephone Encounter (Signed)
Yes, patient has been scheduled.

## 2023-08-10 DIAGNOSIS — M25562 Pain in left knee: Secondary | ICD-10-CM | POA: Diagnosis not present

## 2023-08-12 ENCOUNTER — Other Ambulatory Visit: Payer: Self-pay | Admitting: Radiology

## 2023-08-12 DIAGNOSIS — M1712 Unilateral primary osteoarthritis, left knee: Secondary | ICD-10-CM

## 2023-08-23 ENCOUNTER — Ambulatory Visit: Admitting: Physician Assistant

## 2023-08-23 ENCOUNTER — Encounter: Payer: Self-pay | Admitting: Physician Assistant

## 2023-08-23 DIAGNOSIS — M1712 Unilateral primary osteoarthritis, left knee: Secondary | ICD-10-CM

## 2023-08-23 MED ORDER — HYALURONAN 88 MG/4ML IX SOSY
88.0000 mg | PREFILLED_SYRINGE | INTRA_ARTICULAR | Status: AC | PRN
  Administered 2023-08-23: 88 mg via INTRA_ARTICULAR

## 2023-08-23 NOTE — Progress Notes (Signed)
   Procedure Note  Patient: Alex Jackson             Date of Birth: 05/06/1945           MRN: [id number]             Visit Date: 08/23/2023 HPI: Alex Jackson is a 78 year old male well-known to Dr. Lucienne Ryder service.  He has known knee arthritis involving the left knee which is failed conservative treatment which is included over-the-counter NSAIDs, exercise, and cortisone injections.  He is here today for viscosupplementation left knee.  He has no scheduled surgery in the next 6 months.  Physical exam: Left knee no abnormal warmth erythema.  Slight effusion.  Good range of motion.  Procedures: Visit Diagnoses:  1. Primary osteoarthritis of left knee     Large Joint Inj: L knee on 08/23/2023 12:58 PM Indications: pain Details: 22 G 1.5 in needle, superolateral approach  Arthrogram: No  Medications: 88 mg Hyaluronan 88 MG/4ML Aspirate: 15 mL yellow and blood-tinged Outcome: tolerated well, no immediate complications Procedure, treatment alternatives, risks and benefits explained, specific risks discussed. Consent was given by the patient. Immediately prior to procedure a time out was called to verify the correct patient, procedure, equipment, support staff and site/side marked as required. Patient was prepped and draped in the usual sterile fashion.     Plan: He knows to wait at least 6 months between viscosupplementation injections.  Questions were encouraged and answered at length.  Tolerated aspiration injection only today well.

## 2023-08-30 DIAGNOSIS — M5431 Sciatica, right side: Secondary | ICD-10-CM | POA: Diagnosis not present

## 2023-08-30 DIAGNOSIS — M9903 Segmental and somatic dysfunction of lumbar region: Secondary | ICD-10-CM | POA: Diagnosis not present

## 2023-09-04 ENCOUNTER — Other Ambulatory Visit (HOSPITAL_BASED_OUTPATIENT_CLINIC_OR_DEPARTMENT_OTHER): Payer: Self-pay

## 2023-09-07 ENCOUNTER — Other Ambulatory Visit: Payer: Self-pay

## 2023-09-13 DIAGNOSIS — R972 Elevated prostate specific antigen [PSA]: Secondary | ICD-10-CM | POA: Diagnosis not present

## 2023-09-15 ENCOUNTER — Other Ambulatory Visit (HOSPITAL_BASED_OUTPATIENT_CLINIC_OR_DEPARTMENT_OTHER): Payer: Self-pay

## 2023-09-20 ENCOUNTER — Other Ambulatory Visit (HOSPITAL_BASED_OUTPATIENT_CLINIC_OR_DEPARTMENT_OTHER): Payer: Self-pay

## 2023-09-20 DIAGNOSIS — R052 Subacute cough: Secondary | ICD-10-CM | POA: Diagnosis not present

## 2023-09-20 DIAGNOSIS — K219 Gastro-esophageal reflux disease without esophagitis: Secondary | ICD-10-CM | POA: Diagnosis not present

## 2023-09-20 DIAGNOSIS — J300 Vasomotor rhinitis: Secondary | ICD-10-CM | POA: Diagnosis not present

## 2023-09-20 DIAGNOSIS — H1045 Other chronic allergic conjunctivitis: Secondary | ICD-10-CM | POA: Diagnosis not present

## 2023-09-20 MED ORDER — TACROLIMUS 0.03 % EX OINT
1.0000 | TOPICAL_OINTMENT | Freq: Two times a day (BID) | CUTANEOUS | 3 refills | Status: AC | PRN
  Filled 2023-09-20: qty 30, 15d supply, fill #0
  Filled 2023-09-22 (×2): qty 30, 30d supply, fill #0

## 2023-09-21 ENCOUNTER — Other Ambulatory Visit (HOSPITAL_COMMUNITY): Payer: Self-pay

## 2023-09-21 ENCOUNTER — Other Ambulatory Visit: Payer: Self-pay

## 2023-09-21 ENCOUNTER — Other Ambulatory Visit (HOSPITAL_BASED_OUTPATIENT_CLINIC_OR_DEPARTMENT_OTHER): Payer: Self-pay

## 2023-09-21 DIAGNOSIS — M5431 Sciatica, right side: Secondary | ICD-10-CM | POA: Diagnosis not present

## 2023-09-21 DIAGNOSIS — M9903 Segmental and somatic dysfunction of lumbar region: Secondary | ICD-10-CM | POA: Diagnosis not present

## 2023-09-22 ENCOUNTER — Other Ambulatory Visit (HOSPITAL_BASED_OUTPATIENT_CLINIC_OR_DEPARTMENT_OTHER): Payer: Self-pay

## 2023-09-22 ENCOUNTER — Other Ambulatory Visit: Payer: Self-pay

## 2023-09-22 ENCOUNTER — Other Ambulatory Visit (HOSPITAL_COMMUNITY): Payer: Self-pay

## 2023-09-22 DIAGNOSIS — M25552 Pain in left hip: Secondary | ICD-10-CM | POA: Diagnosis not present

## 2023-09-22 DIAGNOSIS — R2689 Other abnormalities of gait and mobility: Secondary | ICD-10-CM | POA: Diagnosis not present

## 2023-09-22 DIAGNOSIS — M25652 Stiffness of left hip, not elsewhere classified: Secondary | ICD-10-CM | POA: Diagnosis not present

## 2023-09-22 DIAGNOSIS — R531 Weakness: Secondary | ICD-10-CM | POA: Diagnosis not present

## 2023-09-24 DIAGNOSIS — R972 Elevated prostate specific antigen [PSA]: Secondary | ICD-10-CM | POA: Diagnosis not present

## 2023-09-24 DIAGNOSIS — I2510 Atherosclerotic heart disease of native coronary artery without angina pectoris: Secondary | ICD-10-CM | POA: Diagnosis not present

## 2023-09-24 DIAGNOSIS — E785 Hyperlipidemia, unspecified: Secondary | ICD-10-CM | POA: Diagnosis not present

## 2023-09-24 DIAGNOSIS — E1169 Type 2 diabetes mellitus with other specified complication: Secondary | ICD-10-CM | POA: Diagnosis not present

## 2023-09-24 DIAGNOSIS — I5032 Chronic diastolic (congestive) heart failure: Secondary | ICD-10-CM | POA: Diagnosis not present

## 2023-09-24 DIAGNOSIS — Z5181 Encounter for therapeutic drug level monitoring: Secondary | ICD-10-CM | POA: Diagnosis not present

## 2023-09-24 DIAGNOSIS — I10 Essential (primary) hypertension: Secondary | ICD-10-CM | POA: Diagnosis not present

## 2023-09-24 DIAGNOSIS — M5430 Sciatica, unspecified side: Secondary | ICD-10-CM | POA: Diagnosis not present

## 2023-10-01 DIAGNOSIS — I10 Essential (primary) hypertension: Secondary | ICD-10-CM | POA: Diagnosis not present

## 2023-10-01 DIAGNOSIS — E785 Hyperlipidemia, unspecified: Secondary | ICD-10-CM | POA: Diagnosis not present

## 2023-10-01 DIAGNOSIS — E1169 Type 2 diabetes mellitus with other specified complication: Secondary | ICD-10-CM | POA: Diagnosis not present

## 2023-10-04 DIAGNOSIS — M9903 Segmental and somatic dysfunction of lumbar region: Secondary | ICD-10-CM | POA: Diagnosis not present

## 2023-10-04 DIAGNOSIS — M5431 Sciatica, right side: Secondary | ICD-10-CM | POA: Diagnosis not present

## 2023-10-18 ENCOUNTER — Other Ambulatory Visit (HOSPITAL_BASED_OUTPATIENT_CLINIC_OR_DEPARTMENT_OTHER): Payer: Self-pay

## 2023-10-25 ENCOUNTER — Other Ambulatory Visit (HOSPITAL_BASED_OUTPATIENT_CLINIC_OR_DEPARTMENT_OTHER): Payer: Self-pay

## 2023-10-25 DIAGNOSIS — M9903 Segmental and somatic dysfunction of lumbar region: Secondary | ICD-10-CM | POA: Diagnosis not present

## 2023-10-25 DIAGNOSIS — M5431 Sciatica, right side: Secondary | ICD-10-CM | POA: Diagnosis not present

## 2023-10-27 ENCOUNTER — Ambulatory Visit: Admitting: Orthopaedic Surgery

## 2023-10-27 ENCOUNTER — Other Ambulatory Visit (INDEPENDENT_AMBULATORY_CARE_PROVIDER_SITE_OTHER): Payer: Self-pay

## 2023-10-27 ENCOUNTER — Encounter: Payer: Self-pay | Admitting: Orthopaedic Surgery

## 2023-10-27 DIAGNOSIS — M25562 Pain in left knee: Secondary | ICD-10-CM | POA: Diagnosis not present

## 2023-10-27 DIAGNOSIS — M5442 Lumbago with sciatica, left side: Secondary | ICD-10-CM

## 2023-10-27 DIAGNOSIS — G8929 Other chronic pain: Secondary | ICD-10-CM

## 2023-10-27 MED ORDER — METHYLPREDNISOLONE ACETATE 40 MG/ML IJ SUSP
40.0000 mg | INTRAMUSCULAR | Status: AC | PRN
  Administered 2023-10-27: 40 mg via INTRA_ARTICULAR

## 2023-10-27 MED ORDER — LIDOCAINE HCL 1 % IJ SOLN
3.0000 mL | INTRAMUSCULAR | Status: AC | PRN
  Administered 2023-10-27: 3 mL

## 2023-10-27 NOTE — Progress Notes (Signed)
 The patient is well-known to us .  We have replaced the hip before.  We have been following him more recently for his left knee that is arthritic.  He said the hyaluronic acid injection in April helped quite a bit and his knee is not really painful but it is swollen.  He does have a cruise that is leaving on sometime in the next 19 to 20 days.  He used to be followed by neurosurgery in the past for his lumbar spine.  He has had multiple injections in the past and has had a significant flareup of left-sided low back pain is radiating down the sciatic area and down his leg.  He has had multiple injections in the past but it has been a while in his back and the last ones being around the facet joints.  A MRI of his lumbar spine in January 2023 showed a left sided facet synovial cyst at L4-L5 and a look like that that was likely causing some irritation of the left L4 nerve root.  He also had some stenosis at L3-L4.  His left knee today has a moderate effusion.  There is no pain throughout the arc of motion of his knee.  He does have sciatic symptoms to the left side and limited flexion extension of his lumbar spine but this seems to be chronic.  There is pain in the posterior elements of his lower lumbar spine.  X-rays lumbar spine today show degenerative changes at multiple levels but no acute findings.  I did feel is worthwhile aspirating his left knee today since he has an upcoming trip and placing a steroid injection in the left knee.  I would like to send him to Dr. Eldonna for a 1 time left L4-L5 epidural steroid injection since that seems to correlate with his symptoms and this could hopefully help him with his upcoming trip as well.    Procedure Note  Patient: Alex Jackson             Date of Birth: 04/13/1946           MRN: [id number]             Visit Date: 10/27/2023  Procedures: Visit Diagnoses:  1. Chronic left-sided low back pain with left-sided sciatica     Large Joint Inj: L knee  on 10/27/2023 4:37 PM Indications: diagnostic evaluation and pain Details: 22 G 1.5 in needle, superolateral approach  Arthrogram: No  Medications: 3 mL lidocaine  1 %; 40 mg methylPREDNISolone  acetate 40 MG/ML Outcome: tolerated well, no immediate complications Procedure, treatment alternatives, risks and benefits explained, specific risks discussed. Consent was given by the patient. Immediately prior to procedure a time out was called to verify the correct patient, procedure, equipment, support staff and site/side marked as required. Patient was prepped and draped in the usual sterile fashion.

## 2023-10-28 ENCOUNTER — Other Ambulatory Visit: Payer: Self-pay

## 2023-10-28 DIAGNOSIS — G8929 Other chronic pain: Secondary | ICD-10-CM

## 2023-11-01 ENCOUNTER — Other Ambulatory Visit (HOSPITAL_BASED_OUTPATIENT_CLINIC_OR_DEPARTMENT_OTHER): Payer: Self-pay

## 2023-11-16 ENCOUNTER — Other Ambulatory Visit: Payer: Self-pay

## 2023-11-16 ENCOUNTER — Ambulatory Visit: Admitting: Physical Medicine and Rehabilitation

## 2023-11-16 VITALS — BP 131/78 | HR 83

## 2023-11-16 DIAGNOSIS — M5416 Radiculopathy, lumbar region: Secondary | ICD-10-CM

## 2023-11-16 DIAGNOSIS — M9903 Segmental and somatic dysfunction of lumbar region: Secondary | ICD-10-CM | POA: Diagnosis not present

## 2023-11-16 DIAGNOSIS — M5431 Sciatica, right side: Secondary | ICD-10-CM | POA: Diagnosis not present

## 2023-11-16 MED ORDER — METHYLPREDNISOLONE ACETATE 40 MG/ML IJ SUSP
40.0000 mg | Freq: Once | INTRAMUSCULAR | Status: AC
  Administered 2023-11-16: 40 mg

## 2023-11-16 NOTE — Progress Notes (Signed)
 Pain Scale   Average Pain 5 Patient advising he has chronic lower back pain radiating to both legs. Patient advising his pain increases whit walking and standing, an he get some relief when sitting.        +Driver, -BT, -Dye Allergies.

## 2023-11-16 NOTE — Patient Instructions (Signed)

## 2023-11-21 NOTE — Procedures (Signed)
 Lumbar Epidural Steroid Injection - Interlaminar Approach with Fluoroscopic Guidance  Patient: Alex Jackson      Date of Birth: 1946/02/09 MRN: [id number] PCP: Kip Righter, MD      Visit Date: 11/16/2023   Universal Protocol:     Consent Given By: the patient  Position: PRONE  Additional Comments: Vital signs were monitored before and after the procedure. Patient was prepped and draped in the usual sterile fashion. The correct patient, procedure, and site was verified.   Injection Procedure Details:   Procedure diagnoses: Left lumbar radiculopathy [M54.16]   Meds Administered:  Meds ordered this encounter  Medications   methylPREDNISolone  acetate (DEPO-MEDROL ) injection 40 mg     Laterality: Left  Location/Site:  L5-S1  Needle: 3.5 in., 20 ga. Tuohy  Needle Placement: Paramedian epidural  Findings:   -Comments: Excellent flow of contrast into the epidural space.  Procedure Details: Using a paramedian approach from the side mentioned above, the region overlying the inferior lamina was localized under fluoroscopic visualization and the soft tissues overlying this structure were infiltrated with 4 ml. of 1% Lidocaine  without Epinephrine. The Tuohy needle was inserted into the epidural space using a paramedian approach.   The epidural space was localized using loss of resistance along with counter oblique bi-planar fluoroscopic views.  After negative aspirate for air, blood, and CSF, a 2 ml. volume of Isovue -250 was injected into the epidural space and the flow of contrast was observed. Radiographs were obtained for documentation purposes.    The injectate was administered into the level noted above.   Additional Comments:  The patient tolerated the procedure well Dressing: 2 x 2 sterile gauze and Band-Aid    Post-procedure details: Patient was observed during the procedure. Post-procedure instructions were reviewed.  Patient left the clinic in stable  condition.

## 2023-11-21 NOTE — Progress Notes (Signed)
 Alex Jackson - 78 y.o. male MRN [id number]  Date of birth: [birth year]  Office Visit Note: Visit Date: 11/16/2023 PCP: Kip Righter, MD Referred by: Kip Righter, MD  Subjective: Chief Complaint  Patient presents with   Lower Back - Pain   HPI:  Alex Jackson is a 78 y.o. male who comes in today at the request of Dr. Lonni Poli for planned Left L5-S1 Lumbar Interlaminar epidural steroid injection with fluoroscopic guidance.  The patient has failed conservative care including home exercise, medications, time and activity modification.  This injection will be diagnostic and hopefully therapeutic.  Please see requesting physician notes for further details and justification.  He has had multiple injections in the past through [facility] imaging.  The last injections were facet joint blocks.  He does have more facet arthropathy than anything else on the MRI however he is getting some referral pain but his biggest complaint is his back pain.  Depending on relief with this injection would look at intra-articular facet joint injections.  Could consider medial branch blocks and radiofrequency ablation for more back pain.   ROS Otherwise per HPI.  Assessment & Plan: Visit Diagnoses:    ICD-10-CM   1. Left lumbar radiculopathy  M54.16 XR C-ARM NO REPORT    Epidural Steroid injection    methylPREDNISolone  acetate (DEPO-MEDROL ) injection 40 mg      Plan: No additional findings.   Meds & Orders:  Meds ordered this encounter  Medications   methylPREDNISolone  acetate (DEPO-MEDROL ) injection 40 mg    Orders Placed This Encounter  Procedures   XR C-ARM NO REPORT   Epidural Steroid injection    Follow-up: Return for visit to requesting provider as needed.   Procedures: No procedures performed  Lumbar Epidural Steroid Injection - Interlaminar Approach with Fluoroscopic Guidance  Patient: Alex Jackson      Date of Birth: [birth year] MRN: [id number] PCP: Kip Righter, MD      Visit Date: 11/16/2023   Universal Protocol:     Consent Given By: the patient  Position: PRONE  Additional Comments: Vital signs were monitored before and after the procedure. Patient was prepped and draped in the usual sterile fashion. The correct patient, procedure, and site was verified.   Injection Procedure Details:   Procedure diagnoses: Left lumbar radiculopathy [M54.16]   Meds Administered:  Meds ordered this encounter  Medications   methylPREDNISolone  acetate (DEPO-MEDROL ) injection 40 mg     Laterality: Left  Location/Site:  L5-S1  Needle: 3.5 in., 20 ga. Tuohy  Needle Placement: Paramedian epidural  Findings:   -Comments: Excellent flow of contrast into the epidural space.  Procedure Details: Using a paramedian approach from the side mentioned above, the region overlying the inferior lamina was localized under fluoroscopic visualization and the soft tissues overlying this structure were infiltrated with 4 ml. of 1% Lidocaine  without Epinephrine. The Tuohy needle was inserted into the epidural space using a paramedian approach.   The epidural space was localized using loss of resistance along with counter oblique bi-planar fluoroscopic views.  After negative aspirate for air, blood, and CSF, a 2 ml. volume of Isovue -250 was injected into the epidural space and the flow of contrast was observed. Radiographs were obtained for documentation purposes.    The injectate was administered into the level noted above.   Additional Comments:  The patient tolerated the procedure well Dressing: 2 x 2 sterile gauze and Band-Aid    Post-procedure details: Patient was observed during the procedure.  Post-procedure instructions were reviewed.  Patient left the clinic in stable condition.   Clinical History: MRI LUMBAR SPINE WITHOUT CONTRAST   TECHNIQUE: Multiplanar, multisequence MR imaging of the lumbar spine was performed. No intravenous contrast  was administered.   COMPARISON:  02/12/2020   FINDINGS: Segmentation:  Standard.   Alignment:  Stable.  No substantial listhesis.   Vertebrae: Stable vertebral body heights with mild degenerative endplate irregularity and small Schmorl's nodes. No substantial marrow edema. No suspicious osseous lesion.   Conus medullaris and cauda equina: Conus extends to the L1-L2 level. Conus and cauda equina appear normal.   Paraspinal and other soft tissues: Unremarkable.   Disc levels: Congenital narrowing of the spinal canal.   L1-L2: Disc bulge slightly eccentric to the right. Minor canal stenosis. No foraminal stenosis.   L2-L3:  Disc bulge.  Mild canal stenosis.  No foraminal stenosis.   L3-L4: Disc bulge with endplate osteophytic ridging eccentric to the right. Facet arthropathy with ligamentum flavum infolding. Slightly increased moderate canal stenosis. Partial effacement of subarticular recesses. Mild right foraminal stenosis. No left foraminal stenosis.   L4-L5: Disc bulge with endplate osteophytic ridging. Marked facet arthropathy with ligamentum flavum infolding. There is a new 5 mm T2 hyperintense lesion along the left L4 nerve root as it exits the neural foramen. This likely represents a facet synovial cyst. Similar moderate canal stenosis with partial effacement of the subarticular recesses. No right foraminal stenosis. Increased moderate left foraminal stenosis. There is likely compression of the exiting nerve root.   L5-S1: Disc bulge with endplate osteophytic ridging eccentric to the left. Moderate facet arthropathy. Minor canal stenosis. Partial effacement of left subarticular recess. No right foraminal stenosis. Mild left foraminal stenosis.   IMPRESSION: Multilevel degenerative changes as detailed above. There is a new left facet synovial cyst at L4-L5 likely compressing the left L4 nerve root as it exits the foramen. Slightly increased moderate canal stenosis  at L3-L4 with partial effacement of subarticular recesses.     Electronically Signed   By: Santina Blanch M.D.   On: 05/16/2021 18:55     Objective:  VS:  HT:    WT:   BMI:     BP:131/78  HR:83bpm  TEMP: ( )  RESP:  Physical Exam Vitals and nursing note reviewed.  Constitutional:      General: He is not in acute distress.    Appearance: Normal appearance. He is well-developed. He is obese. He is not ill-appearing.  HENT:     Head: Normocephalic and atraumatic.     Right Ear: External ear normal.     Left Ear: External ear normal.     Nose: No congestion.  Eyes:     Extraocular Movements: Extraocular movements intact.     Conjunctiva/sclera: Conjunctivae normal.     Pupils: Pupils are equal, round, and reactive to light.  Cardiovascular:     Rate and Rhythm: Normal rate.     Pulses: Normal pulses.     Heart sounds: Normal heart sounds.  Pulmonary:     Effort: Pulmonary effort is normal. No respiratory distress.  Abdominal:     General: There is no distension.     Palpations: Abdomen is soft.  Musculoskeletal:        General: No tenderness or signs of injury.     Cervical back: Normal range of motion and neck supple. No rigidity.     Right lower leg: No edema.     Left lower leg: No edema.  Comments: Patient has good distal strength without clonus.  Skin:    General: Skin is warm and dry.     Findings: No erythema or rash.  Neurological:     General: No focal deficit present.     Mental Status: He is alert and oriented to person, place, and time.     Sensory: No sensory deficit.     Motor: No weakness or abnormal muscle tone.     Coordination: Coordination normal.     Gait: Gait normal.  Psychiatric:        Mood and Affect: Mood normal.        Behavior: Behavior normal.      Imaging: No results found.

## 2023-12-02 ENCOUNTER — Other Ambulatory Visit (HOSPITAL_BASED_OUTPATIENT_CLINIC_OR_DEPARTMENT_OTHER): Payer: Self-pay

## 2023-12-02 DIAGNOSIS — J0110 Acute frontal sinusitis, unspecified: Secondary | ICD-10-CM | POA: Diagnosis not present

## 2023-12-02 MED ORDER — AMOXICILLIN-POT CLAVULANATE 875-125 MG PO TABS
1.0000 | ORAL_TABLET | Freq: Two times a day (BID) | ORAL | 0 refills | Status: DC
  Filled 2023-12-02: qty 10, 5d supply, fill #0

## 2023-12-03 ENCOUNTER — Other Ambulatory Visit (HOSPITAL_BASED_OUTPATIENT_CLINIC_OR_DEPARTMENT_OTHER): Payer: Self-pay

## 2023-12-07 ENCOUNTER — Other Ambulatory Visit (HOSPITAL_BASED_OUTPATIENT_CLINIC_OR_DEPARTMENT_OTHER): Payer: Self-pay

## 2023-12-07 ENCOUNTER — Other Ambulatory Visit: Payer: Self-pay | Admitting: Pharmacist

## 2023-12-07 ENCOUNTER — Other Ambulatory Visit: Payer: Self-pay

## 2023-12-07 DIAGNOSIS — M5431 Sciatica, right side: Secondary | ICD-10-CM | POA: Diagnosis not present

## 2023-12-07 DIAGNOSIS — M9903 Segmental and somatic dysfunction of lumbar region: Secondary | ICD-10-CM | POA: Diagnosis not present

## 2023-12-07 DIAGNOSIS — E119 Type 2 diabetes mellitus without complications: Secondary | ICD-10-CM

## 2023-12-07 MED FILL — Semaglutide Soln Pen-inj 2 MG/DOSE (8 MG/3ML): SUBCUTANEOUS | 28 days supply | Qty: 3 | Fill #0 | Status: AC

## 2023-12-09 ENCOUNTER — Other Ambulatory Visit (HOSPITAL_BASED_OUTPATIENT_CLINIC_OR_DEPARTMENT_OTHER): Payer: Self-pay

## 2023-12-09 MED ORDER — BENZONATATE 100 MG PO CAPS
100.0000 mg | ORAL_CAPSULE | Freq: Three times a day (TID) | ORAL | 0 refills | Status: AC | PRN
Start: 2023-12-09 — End: ?
  Filled 2023-12-09: qty 30, 5d supply, fill #0

## 2023-12-14 ENCOUNTER — Other Ambulatory Visit (HOSPITAL_BASED_OUTPATIENT_CLINIC_OR_DEPARTMENT_OTHER): Payer: Self-pay

## 2023-12-28 DIAGNOSIS — M5431 Sciatica, right side: Secondary | ICD-10-CM | POA: Diagnosis not present

## 2023-12-28 DIAGNOSIS — M9903 Segmental and somatic dysfunction of lumbar region: Secondary | ICD-10-CM | POA: Diagnosis not present

## 2023-12-30 ENCOUNTER — Other Ambulatory Visit (HOSPITAL_BASED_OUTPATIENT_CLINIC_OR_DEPARTMENT_OTHER): Payer: Self-pay

## 2024-01-04 ENCOUNTER — Other Ambulatory Visit (HOSPITAL_BASED_OUTPATIENT_CLINIC_OR_DEPARTMENT_OTHER): Payer: Self-pay

## 2024-01-04 MED FILL — Semaglutide Soln Pen-inj 2 MG/DOSE (8 MG/3ML): SUBCUTANEOUS | 28 days supply | Qty: 3 | Fill #1 | Status: AC

## 2024-01-18 DIAGNOSIS — M5431 Sciatica, right side: Secondary | ICD-10-CM | POA: Diagnosis not present

## 2024-01-18 DIAGNOSIS — M9903 Segmental and somatic dysfunction of lumbar region: Secondary | ICD-10-CM | POA: Diagnosis not present

## 2024-01-30 MED FILL — Semaglutide Soln Pen-inj 2 MG/DOSE (8 MG/3ML): SUBCUTANEOUS | 28 days supply | Qty: 3 | Fill #2 | Status: AC

## 2024-02-01 ENCOUNTER — Other Ambulatory Visit (HOSPITAL_BASED_OUTPATIENT_CLINIC_OR_DEPARTMENT_OTHER): Payer: Self-pay

## 2024-02-08 DIAGNOSIS — M5431 Sciatica, right side: Secondary | ICD-10-CM | POA: Diagnosis not present

## 2024-02-08 DIAGNOSIS — M9903 Segmental and somatic dysfunction of lumbar region: Secondary | ICD-10-CM | POA: Diagnosis not present

## 2024-02-15 ENCOUNTER — Ambulatory Visit: Admitting: Physician Assistant

## 2024-02-16 ENCOUNTER — Ambulatory Visit: Admitting: Physician Assistant

## 2024-02-17 ENCOUNTER — Encounter: Payer: Self-pay | Admitting: Physician Assistant

## 2024-02-17 ENCOUNTER — Ambulatory Visit: Admitting: Physician Assistant

## 2024-02-17 DIAGNOSIS — M1712 Unilateral primary osteoarthritis, left knee: Secondary | ICD-10-CM

## 2024-02-17 MED ORDER — METHYLPREDNISOLONE ACETATE 40 MG/ML IJ SUSP
40.0000 mg | INTRAMUSCULAR | Status: AC | PRN
  Administered 2024-02-17: 40 mg via INTRA_ARTICULAR

## 2024-02-17 MED ORDER — LIDOCAINE HCL 1 % IJ SOLN
3.0000 mL | INTRAMUSCULAR | Status: AC | PRN
  Administered 2024-02-17: 3 mL

## 2024-02-17 NOTE — Progress Notes (Addendum)
" ° °  Procedure Note  Patient: Alex Jackson             Date of Birth: 1946/03/09           MRN: [id number]             Visit Date: 02/17/2024  HPI: Alex Jackson 78 year old male well-known to our [facility] comes in today requesting left knee aspiration and possible injection.  He has had no known injury to the knee.  Has known osteoarthritis knee.  Denies any fevers chills.  He reports he is not diabetic.  He has this listed on his chart.  He reports his last hemoglobin A1c was 5.4.  Review of systems see HPI   Physical exam: General Well-developed well-nourished male no acute distress mood affect appropriate. Left knee: Full extension full flexion.  Positive effusion no abnormal warmth erythema.   Procedures: Visit Diagnoses:  1. Primary osteoarthritis of left knee     Large Joint Inj: L knee on 02/17/2024 4:38 PM Indications: pain Details: 22 G 1.5 in needle, superolateral approach  Arthrogram: No  Medications: 3 mL lidocaine  1 %; 40 mg methylPREDNISolone  acetate 40 MG/ML Aspirate: 18 mL yellow Outcome: tolerated well, no immediate complications Procedure, treatment alternatives, risks and benefits explained, specific risks discussed. Consent was given by the patient. Immediately prior to procedure a time out was called to verify the correct patient, procedure, equipment, support staff and site/side marked as required. Patient was prepped and draped in the usual sterile fashion.     Plan: He knows to wait at least 3 months between injections.  Questions were encouraged and answered.  Follow-up as needed   "

## 2024-02-21 ENCOUNTER — Other Ambulatory Visit (HOSPITAL_COMMUNITY): Payer: Self-pay

## 2024-02-22 ENCOUNTER — Other Ambulatory Visit (HOSPITAL_BASED_OUTPATIENT_CLINIC_OR_DEPARTMENT_OTHER): Payer: Self-pay

## 2024-02-22 ENCOUNTER — Other Ambulatory Visit: Payer: Self-pay

## 2024-02-22 ENCOUNTER — Other Ambulatory Visit (HOSPITAL_COMMUNITY): Payer: Self-pay

## 2024-02-22 MED ORDER — LEVOCETIRIZINE DIHYDROCHLORIDE 5 MG PO TABS
5.0000 mg | ORAL_TABLET | Freq: Every evening | ORAL | 5 refills | Status: AC
  Filled 2024-02-22: qty 90, 90d supply, fill #0
  Filled 2024-02-22: qty 30, 30d supply, fill #0
  Filled 2024-03-27: qty 90, 90d supply, fill #1

## 2024-02-24 ENCOUNTER — Ambulatory Visit: Admitting: Physician Assistant

## 2024-02-29 MED FILL — Semaglutide Soln Pen-inj 2 MG/DOSE (8 MG/3ML): SUBCUTANEOUS | 28 days supply | Qty: 3 | Fill #3 | Status: AC

## 2024-03-06 ENCOUNTER — Encounter: Payer: Self-pay | Admitting: Radiology

## 2024-03-06 DIAGNOSIS — R972 Elevated prostate specific antigen [PSA]: Secondary | ICD-10-CM | POA: Diagnosis not present

## 2024-03-08 DIAGNOSIS — M5431 Sciatica, right side: Secondary | ICD-10-CM | POA: Diagnosis not present

## 2024-03-08 DIAGNOSIS — M9903 Segmental and somatic dysfunction of lumbar region: Secondary | ICD-10-CM | POA: Diagnosis not present

## 2024-03-13 ENCOUNTER — Other Ambulatory Visit: Payer: Self-pay

## 2024-03-13 ENCOUNTER — Other Ambulatory Visit (HOSPITAL_BASED_OUTPATIENT_CLINIC_OR_DEPARTMENT_OTHER): Payer: Self-pay

## 2024-03-13 DIAGNOSIS — R399 Unspecified symptoms and signs involving the genitourinary system: Secondary | ICD-10-CM | POA: Diagnosis not present

## 2024-03-13 DIAGNOSIS — R972 Elevated prostate specific antigen [PSA]: Secondary | ICD-10-CM | POA: Diagnosis not present

## 2024-03-13 DIAGNOSIS — R3915 Urgency of urination: Secondary | ICD-10-CM | POA: Diagnosis not present

## 2024-03-13 MED ORDER — GEMTESA 75 MG PO TABS
1.0000 | ORAL_TABLET | Freq: Every day | ORAL | 3 refills | Status: AC
  Filled 2024-03-13: qty 90, 90d supply, fill #0

## 2024-03-15 ENCOUNTER — Other Ambulatory Visit: Payer: Self-pay | Admitting: Urology

## 2024-03-15 DIAGNOSIS — R972 Elevated prostate specific antigen [PSA]: Secondary | ICD-10-CM

## 2024-03-16 ENCOUNTER — Other Ambulatory Visit (HOSPITAL_BASED_OUTPATIENT_CLINIC_OR_DEPARTMENT_OTHER): Payer: Self-pay

## 2024-03-21 DIAGNOSIS — M5431 Sciatica, right side: Secondary | ICD-10-CM | POA: Diagnosis not present

## 2024-03-21 DIAGNOSIS — M9903 Segmental and somatic dysfunction of lumbar region: Secondary | ICD-10-CM | POA: Diagnosis not present

## 2024-03-23 ENCOUNTER — Other Ambulatory Visit (HOSPITAL_COMMUNITY): Payer: Self-pay | Admitting: Urology

## 2024-03-23 DIAGNOSIS — R972 Elevated prostate specific antigen [PSA]: Secondary | ICD-10-CM

## 2024-03-25 ENCOUNTER — Other Ambulatory Visit (HOSPITAL_BASED_OUTPATIENT_CLINIC_OR_DEPARTMENT_OTHER): Payer: Self-pay

## 2024-03-25 MED ORDER — SILDENAFIL CITRATE 100 MG PO TABS
50.0000 mg | ORAL_TABLET | ORAL | 1 refills | Status: AC | PRN
  Filled 2024-03-25: qty 20, 20d supply, fill #0

## 2024-03-27 ENCOUNTER — Other Ambulatory Visit (HOSPITAL_BASED_OUTPATIENT_CLINIC_OR_DEPARTMENT_OTHER): Payer: Self-pay

## 2024-03-29 ENCOUNTER — Other Ambulatory Visit: Payer: Self-pay | Admitting: Cardiovascular Disease

## 2024-03-29 ENCOUNTER — Other Ambulatory Visit: Payer: Self-pay

## 2024-03-29 DIAGNOSIS — I2510 Atherosclerotic heart disease of native coronary artery without angina pectoris: Secondary | ICD-10-CM

## 2024-03-29 DIAGNOSIS — I255 Ischemic cardiomyopathy: Secondary | ICD-10-CM

## 2024-03-29 DIAGNOSIS — I25110 Atherosclerotic heart disease of native coronary artery with unstable angina pectoris: Secondary | ICD-10-CM

## 2024-03-29 DIAGNOSIS — Z955 Presence of coronary angioplasty implant and graft: Secondary | ICD-10-CM

## 2024-03-29 DIAGNOSIS — I10 Essential (primary) hypertension: Secondary | ICD-10-CM

## 2024-03-29 DIAGNOSIS — E785 Hyperlipidemia, unspecified: Secondary | ICD-10-CM

## 2024-03-29 MED FILL — Semaglutide Soln Pen-inj 2 MG/DOSE (8 MG/3ML): SUBCUTANEOUS | 28 days supply | Qty: 3 | Fill #4 | Status: AC

## 2024-04-04 ENCOUNTER — Other Ambulatory Visit (HOSPITAL_BASED_OUTPATIENT_CLINIC_OR_DEPARTMENT_OTHER): Payer: Self-pay

## 2024-04-04 MED ORDER — BISOPROLOL FUMARATE 10 MG PO TABS
10.0000 mg | ORAL_TABLET | Freq: Every morning | ORAL | 0 refills | Status: DC
  Filled 2024-04-04: qty 30, 30d supply, fill #0

## 2024-04-04 MED ORDER — ROSUVASTATIN CALCIUM 10 MG PO TABS
10.0000 mg | ORAL_TABLET | Freq: Every day | ORAL | 0 refills | Status: DC
  Filled 2024-04-04: qty 30, 30d supply, fill #0

## 2024-04-07 DIAGNOSIS — I10 Essential (primary) hypertension: Secondary | ICD-10-CM | POA: Diagnosis not present

## 2024-04-07 DIAGNOSIS — E1169 Type 2 diabetes mellitus with other specified complication: Secondary | ICD-10-CM | POA: Diagnosis not present

## 2024-04-07 DIAGNOSIS — Z23 Encounter for immunization: Secondary | ICD-10-CM | POA: Diagnosis not present

## 2024-04-07 DIAGNOSIS — Z0000 Encounter for general adult medical examination without abnormal findings: Secondary | ICD-10-CM | POA: Diagnosis not present

## 2024-04-07 DIAGNOSIS — E785 Hyperlipidemia, unspecified: Secondary | ICD-10-CM | POA: Diagnosis not present

## 2024-04-07 DIAGNOSIS — L309 Dermatitis, unspecified: Secondary | ICD-10-CM | POA: Diagnosis not present

## 2024-04-07 DIAGNOSIS — I2510 Atherosclerotic heart disease of native coronary artery without angina pectoris: Secondary | ICD-10-CM | POA: Diagnosis not present

## 2024-04-07 DIAGNOSIS — Z1159 Encounter for screening for other viral diseases: Secondary | ICD-10-CM | POA: Diagnosis not present

## 2024-04-07 DIAGNOSIS — R972 Elevated prostate specific antigen [PSA]: Secondary | ICD-10-CM | POA: Diagnosis not present

## 2024-04-08 ENCOUNTER — Other Ambulatory Visit (HOSPITAL_BASED_OUTPATIENT_CLINIC_OR_DEPARTMENT_OTHER): Payer: Self-pay

## 2024-04-11 DIAGNOSIS — M9903 Segmental and somatic dysfunction of lumbar region: Secondary | ICD-10-CM | POA: Diagnosis not present

## 2024-04-11 DIAGNOSIS — M5431 Sciatica, right side: Secondary | ICD-10-CM | POA: Diagnosis not present

## 2024-04-20 ENCOUNTER — Ambulatory Visit: Admitting: Cardiovascular Disease

## 2024-04-20 ENCOUNTER — Other Ambulatory Visit: Payer: Self-pay

## 2024-04-20 ENCOUNTER — Other Ambulatory Visit (HOSPITAL_BASED_OUTPATIENT_CLINIC_OR_DEPARTMENT_OTHER): Payer: Self-pay

## 2024-04-20 ENCOUNTER — Encounter: Payer: Self-pay | Admitting: Cardiovascular Disease

## 2024-04-20 ENCOUNTER — Other Ambulatory Visit: Payer: Self-pay | Admitting: *Deleted

## 2024-04-20 VITALS — BP 136/74 | HR 80 | Ht 71.0 in | Wt 252.0 lb

## 2024-04-20 DIAGNOSIS — E785 Hyperlipidemia, unspecified: Secondary | ICD-10-CM

## 2024-04-20 DIAGNOSIS — I2510 Atherosclerotic heart disease of native coronary artery without angina pectoris: Secondary | ICD-10-CM | POA: Diagnosis not present

## 2024-04-20 DIAGNOSIS — I10 Essential (primary) hypertension: Secondary | ICD-10-CM

## 2024-04-20 MED ORDER — ROSUVASTATIN CALCIUM 20 MG PO TABS
20.0000 mg | ORAL_TABLET | Freq: Every day | ORAL | 3 refills | Status: AC
  Filled 2024-04-20 – 2024-04-26 (×2): qty 90, 90d supply, fill #0

## 2024-04-20 NOTE — Patient Instructions (Signed)
 Medication Instructions:  Your physician has recommended you make the following change in your medication: Increase Rosuvastatin  to 20 mg by mouth daily   *If you need a refill on your cardiac medications before your next appointment, please call your pharmacy*  Lab Work: Have fasting lab work done in 3 months.  Lipid and liver profiles.  Can be done at the [facility] on the first floor of our building or any [facility] location If you have labs (blood work) drawn today and your tests are completely normal, you will receive your results only by: MyChart Message (if you have MyChart) OR A paper copy in the mail If you have any lab test that is abnormal or we need to change your treatment, we will call you to review the results.  Testing/Procedures: Your physician has requested that you have an echocardiogram. Echocardiography is a painless test that uses sound waves to create images of your heart. It provides your doctor with information about the size and shape of your heart and how well your hearts chambers and valves are working. This procedure takes approximately one hour. There are no restrictions for this procedure. Please do NOT wear cologne, perfume, aftershave, or lotions (deodorant is allowed). Please arrive 15 minutes prior to your appointment time.  Please note: We ask at that you not bring children with you during ultrasound (echo/ vascular) testing. Due to room size and safety concerns, children are not allowed in the ultrasound rooms during exams. Our front office staff cannot provide observation of children in our lobby area while testing is being conducted. An adult accompanying a patient to their appointment will only be allowed in the ultrasound room at the discretion of the ultrasound technician under special circumstances. We apologize for any inconvenience.   Follow-Up: At [facility], you and your health needs are our priority.  As part of our continuing mission to  provide you with exceptional heart care, our providers are all part of one team.  This team includes your primary Cardiologist (physician) and Advanced Practice Providers or APPs (Physician Assistants and Nurse Practitioners) who all work together to provide you with the care you need, when you need it.  Your next appointment:   12 month(s)  Provider:   Lonni Cash, MD    We recommend signing up for the patient portal called MyChart.  Sign up information is provided on this After Visit Summary.  MyChart is used to connect with patients for Virtual Visits (Telemedicine).  Patients are able to view lab/test results, encounter notes, upcoming appointments, etc.  Non-urgent messages can be sent to your provider as well.   To learn more about what you can do with MyChart, go to [email or web address].   Other Instructions

## 2024-04-20 NOTE — Progress Notes (Signed)
 Chief Complaint  Alex Jackson presents with   Follow-up    CAD   History of Present Illness: 78 yo male with history of CAD, DM, HLD, HTN and spinal stenosis here today for follow up. He has been followed in our office by Dr. Hobart. He had a NSTEMI in 2016 and had a drug eluting stent placed in the LAD. He had a NSTEMI in 2019 and was found to have a severe distal RCA stenosis treated with a drug eluting stent. Normal ABI in 2021. Echo in 2019 with normal LV systolic function, no significant valve disease.   He is here today for follow up. The Alex Jackson denies any chest pain, dyspnea, palpitations, lower extremity edema, orthopnea, PND, dizziness, near syncope or syncope.   Primary Care Physician: Kip Righter, MD   Past Medical History:  Diagnosis Date   CAD in native artery 03/01/2015   1. cath - DES to LAD residual non obstructive RCA disease in 2016 b. Cath s/p Successful PCI with stenting of the distal RCA into the PLOM with a DES. Balloon angioplasty of the PDA through the stent struts.    Diabetes mellitus without complication (HCC)    Edema of both lower extremities    Hyperlipidemia    Hypertension    Leg weakness    Overweight    Spinal stenosis    Ulcer of great toe [facility])     Past Surgical History:  Procedure Laterality Date   CARDIAC CATHETERIZATION N/A 03/01/2015   Procedure: Left Heart Cath and Coronary Angiography;  Surgeon: Lonni JONETTA Cash, MD;  Location: [facility] INVASIVE CV LAB;  Service: Cardiovascular;  Laterality: N/A;   CORONARY STENT INTERVENTION N/A 09/28/2017   Procedure: CORONARY STENT INTERVENTION;  Surgeon: Jordan, Peter M, MD;  Location: [facility] INVASIVE CV LAB;  Service: Cardiovascular;  Laterality: N/A;   LEFT HEART CATH AND CORONARY ANGIOGRAPHY N/A 09/28/2017   Procedure: LEFT HEART CATH AND CORONARY ANGIOGRAPHY;  Surgeon: Jordan, Peter M, MD;  Location: [facility] INVASIVE CV LAB;  Service: Cardiovascular;  Laterality: N/A;   NO PAST SURGERIES     TOTAL HIP  ARTHROPLASTY Right 06/02/2022   Procedure: RIGHT TOTAL HIP ARTHROPLASTY;  Surgeon: Vernetta Lonni GRADE, MD;  Location: [facility] OR;  Service: Orthopedics;  Laterality: Right;    Current Outpatient Medications  Medication Sig Dispense Refill   albuterol  (VENTOLIN  HFA) 108 (90 Base) MCG/ACT inhaler Inhale 2 puffs into the lungs every 4 (four) hours as needed for cough/wheeze 6.7 g 0   aspirin  81 MG chewable tablet Chew 1 tablet (81 mg total) by mouth 2 (two) times daily. 30 tablet 0   bisoprolol  (ZEBETA ) 10 MG tablet Take 1 tablet (10 mg total) by mouth every morning. 30 tablet 0   chlorthalidone  (HYGROTON ) 25 MG tablet Take 1 tablet (25 mg total) by mouth daily. 90 tablet 3   fluticasone  (FLOVENT  HFA) 44 MCG/ACT inhaler Inhale 2 puffs into the lungs 2 (two) times any day that albuterol  is needed 10.6 g 5   furosemide  (LASIX ) 40 MG tablet Take 1 tablet (40 mg total) by mouth daily as needed for fluid or edema. 30 tablet 4   ipratropium (ATROVENT ) 0.03 % nasal spray Place 1-2 sprays into both nostrils 4 (four) times daily as needed for watery nasal discharge 30 mL 5   levocetirizine (XYZAL ) 5 MG tablet Take 1 tablet (5 mg total) by mouth every evening. 30 tablet 5   lisinopril  (ZESTRIL ) 40 MG tablet Take 1 tablet (40 mg total)  by mouth daily. 90 tablet 3   Multiple Vitamin (MULTIVITAMIN WITH MINERALS) TABS tablet Take 1 tablet by mouth in the morning. Men's 50+ Complete Multivitamin     pantoprazole  (PROTONIX ) 40 MG tablet Take 1 tablet (40 mg total) by mouth daily. 90 tablet 3   rosuvastatin  (CRESTOR ) 20 MG tablet Take 1 tablet (20 mg total) by mouth daily. 90 tablet 3   Semaglutide , 2 MG/DOSE, (OZEMPIC , 2 MG/DOSE,) 8 MG/3ML SOPN Inject 2 mg into the skin once a week. 3 mL 11   sildenafil  (VIAGRA ) 100 MG tablet Take 0.5-1 tablets (50-100 mg total) by mouth as needed prior to intercourse. 20 tablet 1   tamsulosin  (FLOMAX ) 0.4 MG CAPS capsule Take 0.4 mg by mouth in the morning.     Vibegron  (GEMTESA )  75 MG TABS Take 1 tablet (75 mg total) by mouth daily. 90 tablet 3   No current facility-administered medications for this visit.    Allergies  Allergen Reactions   Prednisone Anxiety and Other (See Comments)    Makes him crazy   Atorvastatin  Other (See Comments)    Pt reports causes lower extremity muscle aches.   Pravastatin  Other (See Comments)    Pt reports causes bilateral calf cramping.    Social History   Socioeconomic History   Marital status: Divorced    Spouse name: Not on file   Number of children: Not on file   Years of education: Not on file   Highest education level: Not on file  Occupational History   Not on file  Tobacco Use   Smoking status: Former    Current packs/day: 0.00    Average packs/day: 1.0 packs/day    Types: Cigarettes    Quit date: 07/03/1983    Years since quitting: 40.8   Smokeless tobacco: Never   Tobacco comments:    quit in Mar 1985  Vaping Use   Vaping status: Never Used  Substance and Sexual Activity   Alcohol use: Yes    Alcohol/week: 0.0 standard drinks of alcohol    Comment: one or two drinks a night   Drug use: No   Sexual activity: Not on file  Other Topics Concern   Not on file  Social History Narrative   Not on file   Social Drivers of Health   Tobacco Use: Medium Risk (04/20/2024)   Alex Jackson History    Smoking Tobacco Use: Former    Smokeless Tobacco Use: Never    Passive Exposure: Not on Actuary Strain: Not on file  Food Insecurity: Not on file  Transportation Needs: Not on file  Physical Activity: Not on file  Stress: Not on file  Social Connections: Not on file  Intimate Partner Violence: Not on file  Depression (PHQ2-9): Low Risk (02/18/2022)   Depression (PHQ2-9)    PHQ-2 Score: 0  Alcohol Screen: Not on file  Housing: Not on file  Utilities: Not on file  Health Literacy: Not on file    Family History  Problem Relation Age of Onset   Thyroid  cancer Mother    Liver cancer  Father        pancreatic   Arrhythmia Sister     Review of Systems:  As stated in the HPI and otherwise negative.   BP 136/74   Pulse 80   Ht 5' 11 (1.803 m)   Wt 252 lb (114.3 kg)   SpO2 97%   BMI 35.15 kg/m   Physical Examination: General: Well developed, well nourished,  NAD  HEENT: OP clear, mucus membranes moist  SKIN: warm, dry. No rashes. Neuro: No focal deficits  Musculoskeletal: Muscle strength 5/5 all ext  Psychiatric: Mood and affect normal  Neck: No JVD, no carotid bruits, no thyromegaly, no lymphadenopathy.  Lungs:Clear bilaterally, no wheezes, rhonci, crackles Cardiovascular: Regular rate and rhythm. No murmurs, gallops or rubs. Abdomen:Soft. Bowel sounds present. Non-tender.  Extremities: No lower extremity edema. Pulses are 2 + in the bilateral DP/PT.  EKG:  EKG is ordered today. The ekg ordered today demonstrates  EKG Interpretation Date/Time:  Thursday April 20 2024 11:51:15 EST Ventricular Rate:  76 PR Interval:  210 QRS Duration:  98 QT Interval:  382 QTC Calculation: 429 R Axis:   42  Text Interpretation: Sinus rhythm with 1st degree A-V block Possible Inferior infarct , age undetermined When compared with ECG of 19-Mar-2023 08:06, No significant change was found Confirmed by Verlin Bruckner [phone]) on 04/20/2024 11:53:49 AM    Recent Labs: No results found for requested labs within last 365 days.   Lipid Panel    Component Value Date/Time   CHOL 139 03/19/2023 0834   TRIG 212 (H) 03/19/2023 0834   HDL 32 (L) 03/19/2023 0834   CHOLHDL 4.3 03/19/2023 0834   CHOLHDL 5.4 09/27/2017 0222   VLDL UNABLE TO CALCULATE IF TRIGLYCERIDE OVER 400 mg/dL 94/72/7980 9777   LDLCALC 72 03/19/2023 0834     Wt Readings from Last 3 Encounters:  04/20/24 252 lb (114.3 kg)  03/19/23 246 lb (111.6 kg)  06/23/22 236 lb (107 kg)    Assessment and Plan:   1. CAD without angina: No chest pain.  -Continue ASA, Crestor  and bisoprolol . -Will repeat  echo now to assess LV function  2. HTN: BP is well controlled.  -Continue bisoprolol , lisinopril  and chorthalidone  3. Hyperlipidemia: LDL 77 in December 2025.   -Will increase Crestor  to 20 mg daily -Check lipids and LFTs in 12 weeks  Labs/ tests ordered today include:  Orders Placed This Encounter  Procedures   Lipid Profile   Hepatic function panel   EKG 12-Lead   ECHOCARDIOGRAM COMPLETE   Disposition:   F/U with me in one year   Signed, Bruckner Verlin, MD, [facility] 04/20/2024 12:03 PM    [facility] [address] [place], [place], [place]  [address] Phone: [phone]; Fax: [phone]

## 2024-04-26 ENCOUNTER — Other Ambulatory Visit (HOSPITAL_BASED_OUTPATIENT_CLINIC_OR_DEPARTMENT_OTHER): Payer: Self-pay

## 2024-04-26 MED FILL — Semaglutide Soln Pen-inj 2 MG/DOSE (8 MG/3ML): SUBCUTANEOUS | 28 days supply | Qty: 3 | Fill #5 | Status: AC

## 2024-04-29 ENCOUNTER — Other Ambulatory Visit (HOSPITAL_BASED_OUTPATIENT_CLINIC_OR_DEPARTMENT_OTHER): Payer: Self-pay

## 2024-05-08 ENCOUNTER — Other Ambulatory Visit (HOSPITAL_BASED_OUTPATIENT_CLINIC_OR_DEPARTMENT_OTHER): Payer: Self-pay

## 2024-05-08 DIAGNOSIS — I10 Essential (primary) hypertension: Secondary | ICD-10-CM

## 2024-05-08 DIAGNOSIS — I2510 Atherosclerotic heart disease of native coronary artery without angina pectoris: Secondary | ICD-10-CM

## 2024-05-08 DIAGNOSIS — I25110 Atherosclerotic heart disease of native coronary artery with unstable angina pectoris: Secondary | ICD-10-CM

## 2024-05-08 DIAGNOSIS — I255 Ischemic cardiomyopathy: Secondary | ICD-10-CM

## 2024-05-08 DIAGNOSIS — Z955 Presence of coronary angioplasty implant and graft: Secondary | ICD-10-CM

## 2024-05-08 DIAGNOSIS — E785 Hyperlipidemia, unspecified: Secondary | ICD-10-CM

## 2024-05-09 ENCOUNTER — Other Ambulatory Visit: Payer: Self-pay

## 2024-05-09 ENCOUNTER — Other Ambulatory Visit (HOSPITAL_BASED_OUTPATIENT_CLINIC_OR_DEPARTMENT_OTHER): Payer: Self-pay

## 2024-05-09 MED ORDER — BISOPROLOL FUMARATE 10 MG PO TABS
10.0000 mg | ORAL_TABLET | Freq: Every morning | ORAL | 3 refills | Status: AC
  Filled 2024-05-09: qty 90, 90d supply, fill #0

## 2024-05-09 MED ORDER — PANTOPRAZOLE SODIUM 40 MG PO TBEC
40.0000 mg | DELAYED_RELEASE_TABLET | Freq: Every day | ORAL | 3 refills | Status: AC
  Filled 2024-05-09: qty 90, 90d supply, fill #0

## 2024-05-09 MED ORDER — FUROSEMIDE 40 MG PO TABS
40.0000 mg | ORAL_TABLET | Freq: Every day | ORAL | 4 refills | Status: AC | PRN
  Filled 2024-05-09: qty 30, 30d supply, fill #0

## 2024-05-09 MED ORDER — CHLORTHALIDONE 25 MG PO TABS
25.0000 mg | ORAL_TABLET | Freq: Every day | ORAL | 3 refills | Status: AC
  Filled 2024-05-09: qty 90, 90d supply, fill #0

## 2024-05-09 MED ORDER — LISINOPRIL 40 MG PO TABS
40.0000 mg | ORAL_TABLET | Freq: Every day | ORAL | 3 refills | Status: AC
  Filled 2024-05-09: qty 90, 90d supply, fill #0

## 2024-05-11 ENCOUNTER — Other Ambulatory Visit (HOSPITAL_BASED_OUTPATIENT_CLINIC_OR_DEPARTMENT_OTHER): Payer: Self-pay

## 2024-05-11 LAB — LIPID PANEL
Chol/HDL Ratio: 4.4 ratio (ref 0.0–5.0)
Cholesterol, Total: 105 mg/dL (ref 100–199)
HDL: 24 mg/dL — ABNORMAL LOW
LDL Chol Calc (NIH): 47 mg/dL (ref 0–99)
Triglycerides: 206 mg/dL — ABNORMAL HIGH (ref 0–149)
VLDL Cholesterol Cal: 34 mg/dL (ref 5–40)

## 2024-05-11 LAB — HEPATIC FUNCTION PANEL
ALT: 30 IU/L (ref 0–44)
AST: 30 IU/L (ref 0–40)
Albumin: 4.4 g/dL (ref 3.8–4.8)
Alkaline Phosphatase: 72 IU/L (ref 47–123)
Bilirubin Total: 0.6 mg/dL (ref 0.0–1.2)
Bilirubin, Direct: 0.19 mg/dL (ref 0.00–0.40)
Total Protein: 7.1 g/dL (ref 6.0–8.5)

## 2024-05-12 ENCOUNTER — Ambulatory Visit: Payer: Self-pay | Admitting: Cardiovascular Disease

## 2024-05-23 ENCOUNTER — Other Ambulatory Visit (HOSPITAL_BASED_OUTPATIENT_CLINIC_OR_DEPARTMENT_OTHER): Payer: Self-pay

## 2024-05-23 MED FILL — Semaglutide Soln Pen-inj 2 MG/DOSE (8 MG/3ML): SUBCUTANEOUS | 28 days supply | Qty: 3 | Fill #6 | Status: CN

## 2024-05-24 ENCOUNTER — Telehealth: Payer: Self-pay | Admitting: Cardiovascular Disease

## 2024-05-24 ENCOUNTER — Other Ambulatory Visit (HOSPITAL_BASED_OUTPATIENT_CLINIC_OR_DEPARTMENT_OTHER): Payer: Self-pay

## 2024-05-24 ENCOUNTER — Telehealth: Payer: Self-pay | Admitting: Pharmacy Technician

## 2024-05-24 ENCOUNTER — Other Ambulatory Visit (HOSPITAL_COMMUNITY): Payer: Self-pay

## 2024-05-24 MED FILL — Semaglutide Soln Pen-inj 2 MG/DOSE (8 MG/3ML): SUBCUTANEOUS | 28 days supply | Qty: 3 | Fill #6 | Status: AC

## 2024-05-24 NOTE — Telephone Encounter (Signed)
 There is no grant for ozempic . Novo assistance is no longer available as of 05/03/24. [facility] needing a [facility]  New encounter made with prior authorization submission

## 2024-05-24 NOTE — Telephone Encounter (Signed)
 Patient called and didn't mean grant - he meant prior authorization

## 2024-05-24 NOTE — Telephone Encounter (Signed)
 Pharmacy Patient Advocate Encounter  Received notification from [employer] that Prior Authorization for ozempic  has been APPROVED from 05/24/24 to 05/24/25   PA #/Case ID/Reference #: [id number]

## 2024-05-24 NOTE — Telephone Encounter (Signed)
 Pt c/o medication issue:  1. Name of Medication: Semaglutide , 2 MG/DOSE, (OZEMPIC , 2 MG/DOSE,) 8 MG/3ML SOPN   2. How are you currently taking this medication (dosage and times per day)? As written   3. Are you having a reaction (difficulty breathing--STAT)? No   4. What is your medication issue? Patient was calling to renew his grant for this year. Pt would like a call back to discuss please advise     Pt is out of medication

## 2024-05-24 NOTE — Telephone Encounter (Signed)
 Pharmacy Patient Advocate Encounter   Received notification from Pt Calls Messages that prior authorization for ozempic  is required/requested.   Insurance verification completed.   The patient is insured through [facility] [employer].   Per test claim: PA required; PA submitted to above mentioned insurance via Latent Key/confirmation #/EOC [id number] Status is pending   Patient has been getting at the [employer]. No grant nor assistance available for ozempic 

## 2024-05-26 NOTE — Telephone Encounter (Signed)
 SABRA

## 2024-05-29 ENCOUNTER — Ambulatory Visit: Admitting: Physician Assistant

## 2024-05-30 ENCOUNTER — Ambulatory Visit (HOSPITAL_COMMUNITY)

## 2024-05-30 ENCOUNTER — Ambulatory Visit (HOSPITAL_COMMUNITY)
Admission: RE | Admit: 2024-05-30 | Discharge: 2024-05-30 | Disposition: A | Discharge status: Home / Self Care | Source: Ambulatory Visit

## 2024-05-30 DIAGNOSIS — I2510 Atherosclerotic heart disease of native coronary artery without angina pectoris: Secondary | ICD-10-CM | POA: Diagnosis not present

## 2024-05-30 LAB — ECHOCARDIOGRAM COMPLETE
Area-P 1/2: 4.31 cm2
S' Lateral: 3.2 cm

## 2024-06-01 ENCOUNTER — Ambulatory Visit: Admitting: Physician Assistant

## 2024-06-01 ENCOUNTER — Encounter: Payer: Self-pay | Admitting: Physician Assistant

## 2024-06-01 DIAGNOSIS — M1712 Unilateral primary osteoarthritis, left knee: Secondary | ICD-10-CM | POA: Diagnosis not present

## 2024-06-01 MED ORDER — LIDOCAINE HCL 1 % IJ SOLN
3.0000 mL | INTRAMUSCULAR | Status: AC | PRN
  Administered 2024-06-01: 3 mL

## 2024-06-01 MED ORDER — METHYLPREDNISOLONE ACETATE 40 MG/ML IJ SUSP
40.0000 mg | INTRAMUSCULAR | Status: AC | PRN
  Administered 2024-06-01: 40 mg via INTRA_ARTICULAR

## 2024-06-01 NOTE — Progress Notes (Signed)
" ° °  Procedure Note  Patient: Alex Jackson             Date of Birth: 1945-07-26           MRN: [id number]             Visit Date: 06/01/2024 HPI: Mr. Woo comes in today due to left knee pain.  He has known osteoarthritis of left knee.  Denies any new injury.  He states he has had some pain and swelling over the last 2 weeks.  He last had an aspiration injection of the left knee on 02/17/2024.  Reports last hemoglobin A1c 5.4  Review of systems: Denies any fevers chills.  Physical exam: General: Well-developed well-nourished male no acute distress able to get on and off the exam table on his own.  No assistive device. Left knee: Good range of motion.  No abnormal warmth erythema.  Positive effusion. Procedures: Visit Diagnoses:  1. Primary osteoarthritis of left knee     Large Joint Inj on 06/01/2024 1:31 PM Indications: pain Details: 22 G 1.5 in needle, anterolateral approach  Arthrogram: No  Medications: 3 mL lidocaine  1 %; 40 mg methylPREDNISolone  acetate 40 MG/ML Aspirate: 14 mL yellow and blood-tinged Outcome: tolerated well, no immediate complications Procedure, treatment alternatives, risks and benefits explained, specific risks discussed. Consent was given by the patient. Immediately prior to procedure a time out was called to verify the correct patient, procedure, equipment, support staff and site/side marked as required. Patient was prepped and draped in the usual sterile fashion.     Plan: Follow-up as needed knows to wait at least 3 months between cortisone injections.  Questions were answered and encouraged.   "

## 2024-06-05 ENCOUNTER — Ambulatory Visit (HOSPITAL_COMMUNITY)

## 2024-06-12 ENCOUNTER — Other Ambulatory Visit (HOSPITAL_COMMUNITY)

## 2024-06-13 ENCOUNTER — Ambulatory Visit (HOSPITAL_COMMUNITY)

## 2024-06-14 ENCOUNTER — Ambulatory Visit: Admitting: Physician Assistant

## 2024-07-19 ENCOUNTER — Ambulatory Visit: Admitting: Cardiovascular Disease
